# Patient Record
Sex: Female | Born: 1955 | Race: White | Hispanic: No | Marital: Married | Smoking: Never smoker
Health system: Southern US, Community
[De-identification: ages and names within clinical notes are randomized; demographics above are authoritative.]

## PROBLEM LIST (undated history)

## (undated) DIAGNOSIS — N3281 Overactive bladder: Secondary | ICD-10-CM

## (undated) DIAGNOSIS — R600 Localized edema: Secondary | ICD-10-CM

## (undated) DIAGNOSIS — N952 Postmenopausal atrophic vaginitis: Secondary | ICD-10-CM

## (undated) DIAGNOSIS — D369 Benign neoplasm, unspecified site: Secondary | ICD-10-CM

## (undated) DIAGNOSIS — K2210 Ulcer of esophagus without bleeding: Secondary | ICD-10-CM

## (undated) DIAGNOSIS — M797 Fibromyalgia: Secondary | ICD-10-CM

## (undated) DIAGNOSIS — Z8669 Personal history of other diseases of the nervous system and sense organs: Secondary | ICD-10-CM

## (undated) DIAGNOSIS — D126 Benign neoplasm of colon, unspecified: Secondary | ICD-10-CM

## (undated) DIAGNOSIS — R000 Tachycardia, unspecified: Secondary | ICD-10-CM

## (undated) DIAGNOSIS — M1990 Unspecified osteoarthritis, unspecified site: Secondary | ICD-10-CM

## (undated) DIAGNOSIS — L718 Other rosacea: Secondary | ICD-10-CM

## (undated) DIAGNOSIS — Z5189 Encounter for other specified aftercare: Secondary | ICD-10-CM

## (undated) DIAGNOSIS — Z8679 Personal history of other diseases of the circulatory system: Secondary | ICD-10-CM

## (undated) DIAGNOSIS — E7800 Pure hypercholesterolemia, unspecified: Secondary | ICD-10-CM

## (undated) DIAGNOSIS — D131 Benign neoplasm of stomach: Secondary | ICD-10-CM

## (undated) DIAGNOSIS — I7300 Raynaud's syndrome without gangrene: Secondary | ICD-10-CM

## (undated) DIAGNOSIS — K219 Gastro-esophageal reflux disease without esophagitis: Secondary | ICD-10-CM

## (undated) HISTORY — DX: Other rosacea: L71.8

## (undated) HISTORY — PX: FOOT SURGERY: SHX648

## (undated) HISTORY — DX: Benign neoplasm of colon, unspecified: D12.6

## (undated) HISTORY — DX: Overactive bladder: N32.81

## (undated) HISTORY — DX: Localized edema: R60.0

## (undated) HISTORY — DX: Personal history of other diseases of the nervous system and sense organs: Z86.69

## (undated) HISTORY — PX: BUNIONECTOMY: SHX129

## (undated) HISTORY — DX: Encounter for other specified aftercare: Z51.89

## (undated) HISTORY — DX: Unspecified osteoarthritis, unspecified site: M19.90

## (undated) HISTORY — PX: APPENDECTOMY: SHX54

## (undated) HISTORY — DX: Benign neoplasm of stomach: D13.1

## (undated) HISTORY — DX: Raynaud's syndrome without gangrene: I73.00

## (undated) HISTORY — DX: Fibromyalgia: M79.7

## (undated) HISTORY — DX: Postmenopausal atrophic vaginitis: N95.2

## (undated) HISTORY — DX: Pure hypercholesterolemia, unspecified: E78.00

## (undated) HISTORY — PX: TOTAL ABDOMINAL HYSTERECTOMY: SHX209

## (undated) HISTORY — PX: ESOPHAGOGASTRODUODENOSCOPY ENDOSCOPY: SHX5814

## (undated) HISTORY — DX: Benign neoplasm, unspecified site: D36.9

## (undated) HISTORY — PX: CATARACT EXTRACTION BILATERAL W/ ANTERIOR VITRECTOMY: SHX1304

## (undated) HISTORY — PX: OTHER SURGICAL HISTORY: SHX169

## (undated) HISTORY — DX: Gastro-esophageal reflux disease without esophagitis: K21.9

## (undated) HISTORY — PX: BREAST EXCISIONAL BIOPSY: SUR124

## (undated) HISTORY — DX: Ulcer of esophagus without bleeding: K22.10

## (undated) HISTORY — DX: Tachycardia, unspecified: R00.0

## (undated) HISTORY — DX: Personal history of other diseases of the circulatory system: Z86.79

## (undated) HISTORY — PX: REDUCTION MAMMAPLASTY: SUR839

## (undated) HISTORY — PX: BACK SURGERY: SHX140

---
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
## 1980-11-07 HISTORY — PX: OOPHORECTOMY: SHX86

## 2000-11-07 HISTORY — PX: OTHER SURGICAL HISTORY: SHX169

## 2001-01-05 ENCOUNTER — Encounter: Payer: Self-pay | Admitting: Obstetrics and Gynecology

## 2001-01-09 ENCOUNTER — Inpatient Hospital Stay (HOSPITAL_COMMUNITY): Admission: RE | Admit: 2001-01-09 | Discharge: 2001-01-11 | Payer: Self-pay | Admitting: Obstetrics and Gynecology

## 2001-01-09 ENCOUNTER — Encounter (INDEPENDENT_AMBULATORY_CARE_PROVIDER_SITE_OTHER): Payer: Self-pay

## 2001-08-31 ENCOUNTER — Encounter: Admission: RE | Admit: 2001-08-31 | Discharge: 2001-08-31 | Payer: Self-pay | Admitting: Family Medicine

## 2001-08-31 ENCOUNTER — Encounter: Payer: Self-pay | Admitting: Family Medicine

## 2002-01-11 ENCOUNTER — Other Ambulatory Visit: Admission: RE | Admit: 2002-01-11 | Discharge: 2002-01-11 | Payer: Self-pay | Admitting: Obstetrics and Gynecology

## 2002-01-29 ENCOUNTER — Encounter: Admission: RE | Admit: 2002-01-29 | Discharge: 2002-01-29 | Payer: Self-pay | Admitting: Family Medicine

## 2002-01-29 ENCOUNTER — Encounter: Payer: Self-pay | Admitting: Family Medicine

## 2003-01-09 ENCOUNTER — Encounter: Payer: Self-pay | Admitting: Internal Medicine

## 2003-01-09 ENCOUNTER — Encounter: Payer: Self-pay | Admitting: Gastroenterology

## 2003-01-13 ENCOUNTER — Other Ambulatory Visit: Admission: RE | Admit: 2003-01-13 | Discharge: 2003-01-13 | Payer: Self-pay | Admitting: Obstetrics and Gynecology

## 2003-09-26 ENCOUNTER — Encounter: Admission: RE | Admit: 2003-09-26 | Discharge: 2003-09-26 | Payer: Self-pay | Admitting: Family Medicine

## 2004-01-16 ENCOUNTER — Other Ambulatory Visit: Admission: RE | Admit: 2004-01-16 | Discharge: 2004-01-16 | Payer: Self-pay | Admitting: Obstetrics and Gynecology

## 2004-10-07 ENCOUNTER — Ambulatory Visit (HOSPITAL_BASED_OUTPATIENT_CLINIC_OR_DEPARTMENT_OTHER): Admission: RE | Admit: 2004-10-07 | Discharge: 2004-10-07 | Payer: Self-pay | Admitting: Orthopedic Surgery

## 2005-01-06 ENCOUNTER — Ambulatory Visit (HOSPITAL_BASED_OUTPATIENT_CLINIC_OR_DEPARTMENT_OTHER): Admission: RE | Admit: 2005-01-06 | Discharge: 2005-01-06 | Payer: Self-pay | Admitting: Orthopedic Surgery

## 2005-01-17 ENCOUNTER — Other Ambulatory Visit: Admission: RE | Admit: 2005-01-17 | Discharge: 2005-01-17 | Payer: Self-pay | Admitting: Addiction Medicine

## 2005-07-19 ENCOUNTER — Encounter: Admission: RE | Admit: 2005-07-19 | Discharge: 2005-07-19 | Payer: Self-pay | Admitting: Obstetrics and Gynecology

## 2005-11-07 HISTORY — PX: DENTAL SURGERY: SHX609

## 2006-01-23 ENCOUNTER — Other Ambulatory Visit: Admission: RE | Admit: 2006-01-23 | Discharge: 2006-01-23 | Payer: Self-pay | Admitting: Obstetrics and Gynecology

## 2006-07-21 ENCOUNTER — Encounter: Admission: RE | Admit: 2006-07-21 | Discharge: 2006-07-21 | Payer: Self-pay | Admitting: Obstetrics and Gynecology

## 2006-09-14 ENCOUNTER — Ambulatory Visit (HOSPITAL_COMMUNITY): Admission: RE | Admit: 2006-09-14 | Discharge: 2006-09-15 | Payer: Self-pay | Admitting: Neurosurgery

## 2007-01-25 ENCOUNTER — Other Ambulatory Visit: Admission: RE | Admit: 2007-01-25 | Discharge: 2007-01-25 | Payer: Self-pay | Admitting: Obstetrics and Gynecology

## 2007-05-08 HISTORY — PX: OTHER SURGICAL HISTORY: SHX169

## 2007-05-14 ENCOUNTER — Encounter: Admission: RE | Admit: 2007-05-14 | Discharge: 2007-05-14 | Payer: Self-pay | Admitting: Neurosurgery

## 2007-05-31 ENCOUNTER — Ambulatory Visit (HOSPITAL_COMMUNITY): Admission: RE | Admit: 2007-05-31 | Discharge: 2007-06-01 | Payer: Self-pay | Admitting: Neurosurgery

## 2007-07-23 ENCOUNTER — Encounter: Admission: RE | Admit: 2007-07-23 | Discharge: 2007-07-23 | Payer: Self-pay | Admitting: Obstetrics and Gynecology

## 2007-09-25 ENCOUNTER — Encounter: Admission: RE | Admit: 2007-09-25 | Discharge: 2007-09-25 | Payer: Self-pay | Admitting: Obstetrics and Gynecology

## 2008-01-29 ENCOUNTER — Other Ambulatory Visit: Admission: RE | Admit: 2008-01-29 | Discharge: 2008-01-29 | Payer: Self-pay | Admitting: Obstetrics and Gynecology

## 2008-06-12 ENCOUNTER — Encounter: Admission: RE | Admit: 2008-06-12 | Discharge: 2008-06-12 | Payer: Self-pay | Admitting: Family Medicine

## 2008-07-02 HISTORY — PX: CARDIOVASCULAR STRESS TEST: SHX262

## 2008-07-23 ENCOUNTER — Encounter: Admission: RE | Admit: 2008-07-23 | Discharge: 2008-07-23 | Payer: Self-pay | Admitting: Obstetrics and Gynecology

## 2008-07-24 ENCOUNTER — Encounter: Admission: RE | Admit: 2008-07-24 | Discharge: 2008-07-24 | Payer: Self-pay | Admitting: Obstetrics and Gynecology

## 2008-09-10 ENCOUNTER — Telehealth: Payer: Self-pay | Admitting: Gastroenterology

## 2008-09-16 ENCOUNTER — Ambulatory Visit: Payer: Self-pay | Admitting: Obstetrics and Gynecology

## 2008-09-17 ENCOUNTER — Telehealth: Payer: Self-pay | Admitting: Gastroenterology

## 2008-09-19 ENCOUNTER — Ambulatory Visit: Payer: Self-pay | Admitting: Gastroenterology

## 2008-09-19 DIAGNOSIS — K921 Melena: Secondary | ICD-10-CM | POA: Insufficient documentation

## 2008-09-19 DIAGNOSIS — Z860100 Personal history of colon polyps, unspecified: Secondary | ICD-10-CM | POA: Insufficient documentation

## 2008-09-19 DIAGNOSIS — R1032 Left lower quadrant pain: Secondary | ICD-10-CM | POA: Insufficient documentation

## 2008-09-19 DIAGNOSIS — K5900 Constipation, unspecified: Secondary | ICD-10-CM | POA: Insufficient documentation

## 2008-09-19 LAB — CONVERTED CEMR LAB
Basophils Relative: 1.1 % (ref 0.0–3.0)
CO2: 26 meq/L (ref 19–32)
Calcium: 9.4 mg/dL (ref 8.4–10.5)
Chloride: 110 meq/L (ref 96–112)
Eosinophils Absolute: 0 10*3/uL (ref 0.0–0.7)
Eosinophils Relative: 0.5 % (ref 0.0–5.0)
GFR calc non Af Amer: 62 mL/min
HCT: 39.9 % (ref 36.0–46.0)
MCHC: 34.1 g/dL (ref 30.0–36.0)
Monocytes Absolute: 0.4 10*3/uL (ref 0.1–1.0)
Neutro Abs: 4.7 10*3/uL (ref 1.4–7.7)
Platelets: 234 10*3/uL (ref 150–400)
Potassium: 4.2 meq/L (ref 3.5–5.1)
Sodium: 142 meq/L (ref 135–145)
TSH: 1.36 microintl units/mL (ref 0.35–5.50)
Total Bilirubin: 0.7 mg/dL (ref 0.3–1.2)
Total Protein: 6.8 g/dL (ref 6.0–8.3)

## 2008-10-06 ENCOUNTER — Telehealth: Payer: Self-pay | Admitting: Gastroenterology

## 2008-10-08 ENCOUNTER — Ambulatory Visit: Payer: Self-pay | Admitting: Gastroenterology

## 2008-10-08 ENCOUNTER — Encounter: Payer: Self-pay | Admitting: Gastroenterology

## 2008-10-12 ENCOUNTER — Encounter: Payer: Self-pay | Admitting: Gastroenterology

## 2008-11-29 ENCOUNTER — Encounter: Admission: RE | Admit: 2008-11-29 | Discharge: 2008-11-29 | Payer: Self-pay | Admitting: Neurosurgery

## 2008-12-19 ENCOUNTER — Ambulatory Visit: Payer: Self-pay | Admitting: Obstetrics and Gynecology

## 2008-12-24 ENCOUNTER — Encounter: Admission: RE | Admit: 2008-12-24 | Discharge: 2008-12-24 | Payer: Self-pay | Admitting: Obstetrics and Gynecology

## 2009-01-27 ENCOUNTER — Ambulatory Visit: Payer: Self-pay | Admitting: Obstetrics and Gynecology

## 2009-01-29 ENCOUNTER — Ambulatory Visit: Payer: Self-pay | Admitting: Obstetrics and Gynecology

## 2009-01-29 ENCOUNTER — Encounter: Payer: Self-pay | Admitting: Obstetrics and Gynecology

## 2009-01-29 ENCOUNTER — Other Ambulatory Visit: Admission: RE | Admit: 2009-01-29 | Discharge: 2009-01-29 | Payer: Self-pay | Admitting: Obstetrics and Gynecology

## 2009-07-24 ENCOUNTER — Encounter: Admission: RE | Admit: 2009-07-24 | Discharge: 2009-07-24 | Payer: Self-pay | Admitting: Obstetrics and Gynecology

## 2009-10-18 DIAGNOSIS — J4540 Moderate persistent asthma, uncomplicated: Secondary | ICD-10-CM | POA: Insufficient documentation

## 2009-10-18 DIAGNOSIS — M47819 Spondylosis without myelopathy or radiculopathy, site unspecified: Secondary | ICD-10-CM | POA: Insufficient documentation

## 2009-10-18 DIAGNOSIS — G43909 Migraine, unspecified, not intractable, without status migrainosus: Secondary | ICD-10-CM | POA: Insufficient documentation

## 2009-10-18 DIAGNOSIS — Z79890 Hormone replacement therapy (postmenopausal): Secondary | ICD-10-CM | POA: Insufficient documentation

## 2010-01-27 ENCOUNTER — Ambulatory Visit (HOSPITAL_COMMUNITY): Admission: RE | Admit: 2010-01-27 | Discharge: 2010-01-27 | Payer: Self-pay | Admitting: Internal Medicine

## 2010-02-01 ENCOUNTER — Encounter: Payer: Self-pay | Admitting: Gastroenterology

## 2010-02-02 ENCOUNTER — Ambulatory Visit: Payer: Self-pay | Admitting: Obstetrics and Gynecology

## 2010-02-02 ENCOUNTER — Other Ambulatory Visit: Admission: RE | Admit: 2010-02-02 | Discharge: 2010-02-02 | Payer: Self-pay | Admitting: Obstetrics and Gynecology

## 2010-02-03 ENCOUNTER — Ambulatory Visit: Payer: Self-pay | Admitting: Gastroenterology

## 2010-02-03 DIAGNOSIS — R1013 Epigastric pain: Secondary | ICD-10-CM | POA: Insufficient documentation

## 2010-02-03 DIAGNOSIS — R109 Unspecified abdominal pain: Secondary | ICD-10-CM | POA: Insufficient documentation

## 2010-02-03 DIAGNOSIS — K219 Gastro-esophageal reflux disease without esophagitis: Secondary | ICD-10-CM | POA: Insufficient documentation

## 2010-02-09 ENCOUNTER — Ambulatory Visit: Payer: Self-pay | Admitting: Internal Medicine

## 2010-02-09 ENCOUNTER — Other Ambulatory Visit: Admission: RE | Admit: 2010-02-09 | Discharge: 2010-02-09 | Payer: Self-pay | Admitting: Internal Medicine

## 2010-02-11 ENCOUNTER — Encounter: Payer: Self-pay | Admitting: Internal Medicine

## 2010-02-23 ENCOUNTER — Ambulatory Visit: Payer: Self-pay | Admitting: Obstetrics and Gynecology

## 2010-03-01 ENCOUNTER — Telehealth: Payer: Self-pay | Admitting: Gastroenterology

## 2010-03-01 ENCOUNTER — Ambulatory Visit (HOSPITAL_COMMUNITY): Admission: RE | Admit: 2010-03-01 | Discharge: 2010-03-01 | Payer: Self-pay | Admitting: Gastroenterology

## 2010-04-19 DIAGNOSIS — F331 Major depressive disorder, recurrent, moderate: Secondary | ICD-10-CM | POA: Insufficient documentation

## 2010-07-26 ENCOUNTER — Encounter: Admission: RE | Admit: 2010-07-26 | Discharge: 2010-07-26 | Payer: Self-pay | Admitting: Obstetrics and Gynecology

## 2010-10-27 ENCOUNTER — Ambulatory Visit
Admission: RE | Admit: 2010-10-27 | Discharge: 2010-10-27 | Payer: Self-pay | Source: Home / Self Care | Attending: Orthopedic Surgery | Admitting: Orthopedic Surgery

## 2010-11-07 HISTORY — PX: REDUCTION MAMMAPLASTY: SUR839

## 2010-11-29 ENCOUNTER — Ambulatory Visit
Admission: RE | Admit: 2010-11-29 | Discharge: 2010-11-29 | Payer: Self-pay | Source: Home / Self Care | Attending: Obstetrics and Gynecology | Admitting: Obstetrics and Gynecology

## 2010-11-30 ENCOUNTER — Ambulatory Visit: Admit: 2010-11-30 | Payer: Self-pay | Admitting: Obstetrics and Gynecology

## 2010-12-09 NOTE — Assessment & Plan Note (Signed)
Summary: UPPER ABD PAIN...AS.   History of Present Illness Visit Type: Follow-up Visit Primary GI MD: Elie Goody MD [facility] Primary Provider: Guerry Bruin, MD Chief Complaint: Epigastric pain x 1 week, loss  of appetite History of Present Illness:   Lauren Montes relates a one week history of worsening problems with upper abdominal pain, belching, bloating, loss of appetite, and nausea. Her symptoms began abruptly one week ago and were associated with shortness of breath and tachycardic. She was evaluated by Dr. Waynard Edwards and a CT angiogram was negative, abdominal ultrasound was negative, and blood work was unremarkable. She was placed on Dexilant with some improvement in her symptoms. She has not tried Levsin for her symptoms. She has had chronic constipation, which is mildly improved with MiraLax. She underwent colonoscopy in December 2009, showing only small, hyperplastic polyps.   GI Review of Systems    Reports abdominal pain, belching, bloating, loss of appetite, and  nausea.      Denies acid reflux, chest pain, dysphagia with liquids, dysphagia with solids, heartburn, vomiting, vomiting blood, weight loss, and  weight gain.      Reports constipation, diarrhea, and  light color stool.     Denies anal fissure, black tarry stools, change in bowel habit, diverticulosis, fecal incontinence, heme positive stool, hemorrhoids, irritable bowel syndrome, jaundice, liver problems, rectal bleeding, and  rectal pain.   Current Medications (verified): 1)  Singulair 10 Mg Tabs (Montelukast Sodium) .Marland Kitchen.. 1 Tablet By Mouth At Bedtime 2)  Topiramate 25 Mg Tabs (Topiramate) .... 2 Tablets By Mouth At Bedtime 3)  Estradiol 0.0375 Mg/24hr Ptwk (Estradiol) .Marland Kitchen.. 1 Patch Weekly 4)  Advair Diskus 250-50 Mcg/dose Misc (Fluticasone-Salmeterol) .Marland Kitchen.. 1 Puff Two Times A Day 5)  Cyclobenzaprine Hcl 10 Mg Tabs (Cyclobenzaprine Hcl) .... As Needed 6)  Nabumetone 500 Mg Tabs (Nabumetone) .Marland Kitchen.. 1 Tablet By Mouth Two  Times A Day As Needed 7)  Propranolol Hcl 10 Mg Tabs (Propranolol Hcl) .... Hold 8)  Diltiazem Hcl Cr 180 Mg Xr24h-Cap (Diltiazem Hcl) .Marland Kitchen.. 1 Capsule By Mouth Once Daily 9)  Zyrtec Allergy 10 Mg Tabs (Cetirizine Hcl) .Marland Kitchen.. 1 Tablet By Mouth Qam 10)  Daily Multiple Vitamins/iron  Tabs (Multiple Vitamins-Iron) .Marland Kitchen.. 1 Tablet By Mouth Once Daily 11)  Oscal 500/200 D-3 500-200 Mg-Unit Tabs (Calcium-Vitamin D) .Marland Kitchen.. 1 Tablet By Mouth Two Times A Day 12)  Levsin/sl 0.125 Mg Subl (Hyoscyamine Sulfate) .... 2 Tablets By Mouth Four Times A Day As Needed For Abd Pain 13)  Miralax  Pack (Polyethylene Glycol 3350) .... One Pack Into 17 Oz of Water Once Daily 14)  Dexilant 60 Mg Cpdr (Dexlansoprazole) .... Once Daily  Allergies (verified): 1)  ! Codeine  Past History:  Past Medical History: Reviewed history from 09/19/2008 and no changes required. Arthritis Allergies Asthma Hx of hyperplastic colon polyp 01/2003 Anemia Arrhythmia Headaches  Past Surgical History: Hysterectomy Benign breast biopsy Surgical removal of a ovarian tumor Appendectomy Back Surgery x2  Family History: Reviewed history from 09/19/2008 and no changes required. No FH of Colon Cancer: Family History of Colon Polyps: Grandmother Family History of Pancreatic Cancer:Aunt Family History of Stomach Cancer:Aunt Family History of Heart Disease: Father  Social History: Reviewed history from 09/19/2008 and no changes required. Married Patient has never smoked.  Alcohol Use - yes Illicit Drug Use - no Occupation: retired  Review of Systems       The patient complains of allergy/sinus, arthritis/joint pain, back pain, cough, fatigue, headaches-new, heart rhythm changes, shortness of breath,  sleeping problems, and urination changes/pain.         The pertinent positives and negatives are noted as above and in the HPI. All other ROS were reviewed and were negative.   Vital Signs:  Patient profile:   55 year old  female Height:      65.5 inches Weight:      145.13 pounds BMI:     23.87 Pulse rate:   64 / minute Pulse rhythm:   regular BP sitting:   106 / 80  (left arm) Cuff size:   regular  Vitals Entered By: June McMurray CMA Duncan Dull) (February 03, 2010 3:23 PM)  Physical Exam  General:  Well developed, well nourished, no acute distress. Head:  Normocephalic and atraumatic. Eyes:  PERRLA, no icterus. Ears:  Normal auditory acuity. Mouth:  No deformity or lesions, dentition normal. Neck:  Supple; no masses or thyromegaly. Lungs:  Clear throughout to auscultation. Heart:  Regular rate and rhythm; no murmurs, rubs,  or bruits. Abdomen:  Soft and nondistended. No masses, hepatosplenomegaly or hernias noted. Normal bowel sounds. Very mild upper abdominal tenderness to deep palpation without rebound or guarding. Psych:  Alert and cooperative. Anxious.    Impression & Recommendations:  Problem # 1:  ABDOMINAL PAIN-MULTIPLE SITES (ICD-789.09) Abdominal pain associated with gas, bloating, constipation, and reflux symptoms. Rule out ulcer disease, GERD, and irritable bowel syndrome. Rule out acalculous cholecystitis. The risks, benefits and alternatives to endoscopy with possible biopsy and possible dilation were discussed with the patient and they consent to proceed. The procedure will be scheduled electively. Schedule CCK HIDA. Advised to use Levsin  1-2 q.i.d. p.r.n. ontinue Dexliant 60 mg q.a.m. begin Align one q. day and titrate MiraLax up to 3 times a day as needed for management of constipation.  Problem # 2:  GERD (ICD-530.81) See problem #1.  Problem # 3:  CONSTIPATION (ICD-564.00) See problem #1. Rule out irritable bowel syndrome.  Other Orders: HIDA CCK (HIDA CCK) EGD (EGD)   Patient Instructions: 1)  Please increase Miralax to three times a day dosing. You may titrate the dose as needed for adequate bowel movement control. 2)  Please take your Levsin up to four times daily as  needed. 3)  Please go to [facility] on 02/23/10 @ 11 am for your HIDA with CCK. 4)  Your endoscopy will be on 02/09/10 @ 3:00 pm. You will need to arrive on the 4th floor of [facility] at 2:00 pm for registration. 5)  Please take Align once daily x 1 month. If this helps, you may continue taking it. Align can be purchased over the counter. We have given you a coupon to use. 6)  The medication list was reviewed and reconciled.  All changed / newly prescribed medications were explained.  A complete medication list was provided to the patient / caregiver.

## 2010-12-09 NOTE — Letter (Signed)
Summary: [facility]  [facility]   Imported By: Sherian Rein 02/16/2010 10:20:41  _____________________________________________________________________  External Attachment:    Type:   Image     Comment:   External Document

## 2010-12-09 NOTE — Letter (Signed)
Summary: Patient [facility] Biopsy Results  [facility]  [address] [place], [place] [address]   Phone: [phone]  Fax: [phone]        February 11, 2010 MRN: [id number]    [facility] [address] [place], [place]  [address]    Dear Ms. Stai,  I am pleased to inform you that the biopsies taken during your recent endoscopic examination did not show any evidence of cancer upon pathologic examination.There is a yeast infection on the collected tissue. All other biopsies are normal  Additional information/recommendations:  __No further action is needed at this time.  Please follow-up with      your primary care physician for your other healthcare needs.  __ Please call [phone] to schedule a return visit to review      your condition.  _x_ Continue with the treatment plan as outlined on the day of your      exam.  _   Please call us if you are having persistent problems or have questions about your condition that have not been fully answered at this time.  Sincerely,  Hart Carwin MD  This letter has been electronically signed by your physician.  Appended Document: Patient Notice-Endo Biopsy Results letter mailed 4.11.11

## 2010-12-09 NOTE — Procedures (Signed)
Summary: Upper Endoscopy  Patient: [name] Note: All result statuses are Final unless otherwise noted.  Tests: (1) Upper Endoscopy (EGD)   EGD Upper Endoscopy       DONE     [facility]     [address]     [place], [place]  [address]           ENDOSCOPY PROCEDURE REPORT           PATIENT:  Lauren Montes, Lauren Montes  MR#:  [id number]     BIRTHDATE:  15-Jun-1956, 53 yrs. old  GENDER:  female           ENDOSCOPIST:  Hedwig [place]. Juanda Chance, MD     Referred by:  Guerry Bruin, M.D.           PROCEDURE DATE:  02/09/2010     PROCEDURE:  EGD with biopsy, EGD w/brushings     ASA CLASS:  Class I     INDICATIONS:  dysphagia, dyspepsia, epigastric pain obrupt onset     of dyspepsia, " choking", nausea,, also SOB and tachycardia     abd.     Dexilant helps some sono negative     hx of asthmatic bronchitis           MEDICATIONS:   Versed 12 mg, Fentanyl 100 mcg     TOPICAL ANESTHETIC:  Exactacain Spray           DESCRIPTION OF PROCEDURE:   After the risks benefits and     alternatives of the procedure were thoroughly explained, informed     consent was obtained.  The LB GIF-H180 [id number] endoscope was     introduced through the mouth and advanced to the second portion of     the duodenum, without limitations.  The instrument was slowly     withdrawn as the mucosa was fully examined.     <<PROCEDUREIMAGES>>           Esophagitis was found in the total esophagus. specks of whitish     exudate throughout the esophagus A brushing for microbiology was     obtained for fungal analysis. With standard forceps, a biopsy was     obtained and sent to pathology (see image1, image2, image7,     image8, image11, and image12).  Otherwise the examination was     normal. biopsies from duodenul to r/o sprue     biopsies from gastric antrum to r/o H.Pylori With standard     forceps, a biopsy was obtained and sent to pathology (see image4     and image3).  There were multiple polyps identified (see  image5     and image6). fundic gland polyps    Retroflexed views revealed no     abnormalities.    The scope was then withdrawn from the patient     and the procedure completed.           COMPLICATIONS:  None           ENDOSCOPIC IMPRESSION:     1) Esophagitis in the total esophagus     2) Otherwise normal examination     3) Polyps, multiple     Candida esophagitis, suspected, s/p brushings, may explain "     choking" feeling     pt was anxious, required higher dose of Versed/fentanyl for     sedation, r/o functional dyspepsia     RECOMMENDATIONS:     1) Await biopsy results  continue PPI     Diflucan 100mg  po qdx3, # 3, no refill     HIDA with CCK already scheduled     cont LevsinSL,     F/up appointm. with Dr Russella Dar 3-4 weeks           REPEAT EXAM:  In 0 year(s) for.           ______________________________     Hedwig [place]. Juanda Chance, MD           CC:           n.     eSIGNED:   Hedwig [place]. Brodie at 02/09/2010 04:19 PM           Page 2 of 3   Lauren Montes, Lauren Montes, [id number]  Note: An exclamation mark (!) indicates a result that was not dispersed into the flowsheet. Document Creation Date: 02/10/2010 9:03 AM _______________________________________________________________________  (1) Order result status: Final Collection or observation date-time: 02/09/2010 16:05 Requested date-time:  Receipt date-time:  Reported date-time:  Referring Physician:   Ordering Physician: Lina Sar [phone]) Specimen Source:  Source: Launa Grill Order Number: [id number] Lab site:

## 2010-12-09 NOTE — Letter (Signed)
Summary: EGD Instructions  [facility]  [address] [place], [place] [address]   Phone: [phone]  Fax: [phone]       Lauren Montes    September 02, 1956    MRN: [id number]       Procedure Day /Date: 02/09/10 Tuesday     Arrival Time: 2:00 pm     Procedure Time: 3:00 pm     Location of Procedure:                    _x  _ [facility] (4th Floor)  PREPARATION FOR ENDOSCOPY   On_ 02/09/10 THE DAY OF THE PROCEDURE:  1.   No solid foods, milk or milk products are allowed after midnight the night before your procedure.  2.   Do not drink anything colored red or purple.  Avoid juices with pulp.  No orange juice.  3.  You may drink clear liquids until 1:00 pm, which is 2 hours before your procedure.                                                                                                CLEAR LIQUIDS INCLUDE: Water Jello Ice Popsicles Tea (sugar ok, no milk/cream) Powdered fruit flavored drinks Coffee (sugar ok, no milk/cream) Gatorade Juice: apple, white grape, white cranberry  Lemonade Clear bullion, consomm, broth Carbonated beverages (any kind) Strained chicken noodle soup Hard Candy   MEDICATION INSTRUCTIONS  Unless otherwise instructed, you should take regular prescription medications with a small sip of water as early as possible the morning of your procedure.                 OTHER INSTRUCTIONS   You will need a responsible adult at least 55 years of age to accompany you and drive you home.   This person must remain in the waiting room during your procedure.  Wear loose fitting clothing that is easily removed.  Leave jewelry and other valuables at home.  However, you may wish to bring a book to read or an iPod/MP3 player to listen to music as you wait for your procedure to start.  Remove all body piercing jewelry and leave at home.  Total time from sign-in until discharge is approximately 2-3 hours.  You should go  home directly after your procedure and rest.  You can resume normal activities the day after your procedure.  The day of your procedure you should not:   Drive   Make legal decisions   Operate machinery   Drink alcohol   Return to work  You will receive specific instructions about eating, activities and medications before you leave.    The above instructions have been reviewed and explained to me by  Hortense Ramal CMA Duncan Dull)  February 03, 2010 3:57 PM     I fully understand and can verbalize these instructions _____________________________ Date 02/03/10

## 2010-12-09 NOTE — Miscellaneous (Signed)
Summary: gi med  Clinical Lists Changes  Medications: Added new medication of DIFLUCAN 100 MG  TABS (FLUCONAZOLE) by mouth x once daily x3d - Signed Rx of DIFLUCAN 100 MG  TABS (FLUCONAZOLE) by mouth x once daily x3d;  #3 x 0;  Signed;  Entered by: Eual Fines RN;  Authorized by: Hart Carwin MD;  Method used: Electronically to [facility] [address] [phone]*, [address], [place], [place], [place]  [address], Ph: [phone] or [phone], Fax: [phone] Observations: Added new observation of ALLERGY REV: Done (02/09/2010 16:38)    Prescriptions: DIFLUCAN 100 MG  TABS (FLUCONAZOLE) by mouth x once daily x3d  #3 x 0   Entered by:   Eual Fines RN   Authorized by:   Hart Carwin MD   Signed by:   Eual Fines RN on 02/09/2010   Method used:   [facility]        [facility] [address] [phone]* (retail)       [address]       [place], [place]  [address]       Ph: [phone] or [phone]       Fax: [phone]   RxID:   [id number]

## 2010-12-09 NOTE — Progress Notes (Signed)
Summary: results/appt  Phone Note Call from Patient Call back at Home Phone [phone]   Caller: Patient Call For: Russella Dar Reason for Call: Lab or Test Results Summary of Call: Patient wants to schedule an appt to get her test results (for next week)  Initial call taken by: Tawni Levy,  March 01, 2010 2:41 PM     Appended Document: results/appt Patient  advised that Dr Russella Dar is out of the office and that we will call with results of HIDA when he reviews.

## 2010-12-29 ENCOUNTER — Ambulatory Visit: Payer: Self-pay | Admitting: Cardiovascular Disease

## 2011-01-17 LAB — BASIC METABOLIC PANEL
BUN: 15 mg/dL (ref 6–23)
Calcium: 9.5 mg/dL (ref 8.4–10.5)
Creatinine, Ser: 0.91 mg/dL (ref 0.4–1.2)
GFR calc Af Amer: >60 mL/min (ref >60)
Glucose, Bld: 97 mg/dL (ref 70–99)

## 2011-01-20 ENCOUNTER — Encounter: Payer: Self-pay | Admitting: Cardiovascular Disease

## 2011-01-20 ENCOUNTER — Ambulatory Visit (INDEPENDENT_AMBULATORY_CARE_PROVIDER_SITE_OTHER): Payer: 59 | Admitting: Cardiovascular Disease

## 2011-01-20 DIAGNOSIS — M7989 Other specified soft tissue disorders: Secondary | ICD-10-CM | POA: Insufficient documentation

## 2011-01-20 DIAGNOSIS — E7800 Pure hypercholesterolemia, unspecified: Secondary | ICD-10-CM

## 2011-01-20 DIAGNOSIS — R079 Chest pain, unspecified: Secondary | ICD-10-CM

## 2011-01-21 ENCOUNTER — Encounter: Payer: Self-pay | Admitting: Cardiovascular Disease

## 2011-01-21 ENCOUNTER — Encounter (INDEPENDENT_AMBULATORY_CARE_PROVIDER_SITE_OTHER): Payer: 59

## 2011-01-21 DIAGNOSIS — M7989 Other specified soft tissue disorders: Secondary | ICD-10-CM

## 2011-01-25 NOTE — Miscellaneous (Signed)
Summary: Orders Update  Clinical Lists Changes  Problems: Added new problem of SWELLING OF LIMB (ICD-729.81) Orders: Added new Test order of Venous Duplex Lower Extremity (Venous Duplex Lower) - Signed 

## 2011-01-26 ENCOUNTER — Other Ambulatory Visit: Payer: Self-pay | Admitting: Dermatology

## 2011-01-27 ENCOUNTER — Telehealth: Payer: Self-pay | Admitting: Cardiology

## 2011-01-27 NOTE — Telephone Encounter (Signed)
Please fax records [phone], needs, last ov, ekg, stress test, echo.

## 2011-02-07 ENCOUNTER — Other Ambulatory Visit (HOSPITAL_COMMUNITY)
Admission: RE | Admit: 2011-02-07 | Discharge: 2011-02-07 | Disposition: A | Discharge status: Home / Self Care | Payer: 59 | Source: Ambulatory Visit | Attending: Obstetrics and Gynecology | Admitting: Obstetrics and Gynecology

## 2011-02-07 ENCOUNTER — Other Ambulatory Visit: Payer: Self-pay | Admitting: Obstetrics and Gynecology

## 2011-02-07 ENCOUNTER — Encounter (INDEPENDENT_AMBULATORY_CARE_PROVIDER_SITE_OTHER): Payer: 59 | Admitting: Obstetrics and Gynecology

## 2011-02-07 DIAGNOSIS — Z124 Encounter for screening for malignant neoplasm of cervix: Secondary | ICD-10-CM | POA: Insufficient documentation

## 2011-02-07 DIAGNOSIS — R809 Proteinuria, unspecified: Secondary | ICD-10-CM

## 2011-02-07 DIAGNOSIS — Z01419 Encounter for gynecological examination (general) (routine) without abnormal findings: Secondary | ICD-10-CM

## 2011-02-16 ENCOUNTER — Encounter (INDEPENDENT_AMBULATORY_CARE_PROVIDER_SITE_OTHER): Payer: 59

## 2011-02-16 DIAGNOSIS — Z13820 Encounter for screening for osteoporosis: Secondary | ICD-10-CM

## 2011-02-23 ENCOUNTER — Other Ambulatory Visit: Payer: Self-pay | Admitting: Dermatology

## 2011-03-22 NOTE — Op Note (Signed)
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   PROCEDURE:  Left L4-5 lumbar laminotomy and microdiskectomy with  microdissection.   SURGEON:  Hewitt Shorts, M.D.   ASSISTANT:  Lovell Sheehan.   ANESTHESIA:  General endotracheal.   INDICATION:  The patient is a 55 year old woman who presented with an  recurrent left lumbar radiculopathy and is found by MRI scan to have a  recurrent left L4-5 lumbar disk herniation.  Decision was made to  proceed with elective laminotomy and microdiskectomy.   PROCEDURE:  The patient was brought to the operating room, placed under  general endotracheal anesthesia.  The patient was turned to a prone  position.  Lumbar region was prepped with Betadine soaping solution,  draped in a sterile fashion.  The midline was infiltrated with local  anesthetic with epinephrine and a midline incision was made and carried  down to the subcutaneous tissue.  Bipolar cautery and electrocautery was  used to maintain hemostasis.  Dissection was carried down to the lumbar  fascia which was incised on the left side of the midline.  The  paraspinal muscle were dissected from the spinous process and lamina in  a subperiosteal fashion.  The L4-5 intralaminar space was identified and  then the microscope was draped and brought into the field to provide  additional magnification,  illumination and visualization and the  remainder of the decompression was performed using microdissection and  microsurgical technique.   Laminotomy was extended rostrally and laterally.  The edges of the  laminotomy were dissected from the underlying scar tissue and we were  able to free up the thecal sac and left L5 nerve root and gently  mobilize these medially.  We incised the scar tissue overlying the disk  herniation and a moderate sized free fragment extruded.  We were able to  remove that.  We then entered into the disk space and removed additional  amounts of degenerated disk material and in the end, all loose fragments  of disk material were removed from both the disk space and the epidural  space and good decompression of the thecal sac and nerve roots achieved.  Once decompression was completed, hemostasis was established and then we  instilled 2 mL of fentanyl and 80 mg of Depo-Medrol into the epidural  space and proceeded with closure.  The deep fascia was closed with  interrupted, undyed 1 Vicryl sutures.  The Scarpa fascia was closed with  interrupted, undyed 1 and 2-0 Vicryl sutures and the subcutaneous and  subcuticular were closed with interrupted, inverted  2-0 and 3-0 undyed  Vicryl sutures and the skin was reapproximated with Dermabond.  The  procedure was tolerated well.  The estimated blood loss was less than 35  mL.  Sponge and needle count were correct.   Following surgery, the patient was turned back to supine position to be  reversed from anesthetic, extubated and transferred to the recovery room  for further care.      Hewitt Shorts, M.D.  Electronically Signed     RWN/MEDQ  D:  05/31/2007  T:  05/31/2007  Job:  [id number]

## 2011-03-25 NOTE — Discharge Summary (Signed)
[facility]  Patient:    Lauren Montes, Lauren Montes                 MRN: [id number] Adm. Date:  [id number] Disc. Date: [id number] Attending:  Sharon Mt                           Discharge Summary  HISTORY OF PRESENT ILLNESS:  The patients a 55 year old female who was admitted to the hospital with a 10 cm pelvic mass. She was taken to the operating room and underwent a right salpingo-oophorectomy having previously undergo a total abdominal hysterectomy and left salpingo-oophorectomy. 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 Postoperatively she did well and by the second postoperative day, she was ready for discharge. She was discharged on Darvocet-N 100 for pain relief. She will resume her Estrace 1 mg daily. She will have a soft diet until she is passing gas well and then will increase in her diet.  DISCHARGE ACTIVITY:  Ambulatory. She will return to our office next Tuesday for staple removal.  FINAL PATHOLOGY:  Report not available at time of dictation.  DISCHARGE DIAGNOSES:  Benign cystic teratoma.  OPERATION PERFORMED:  Exploratory laparotomy with right salpingo-oophorectomy. D:  01/11/01 TD:  01/11/01 Job: [id number]

## 2011-03-25 NOTE — H&P (Signed)
[facility]  Patient:    Lauren Montes, Lauren Montes                         MRN: [id number] Attending:  Rande Brunt. Eda Paschal, M.D.                         History and Physical  CHIEF COMPLAINT:  Adnexal mass.  HISTORY OF PRESENT ILLNESS:  The patient is a 55 year old gravida 1, para 1, AB 0, who came to seem me on February 15 for an annual gynecological exam. At the time of the exam, an adnexal mass in the midline of 9 cm was felt. The patient is status post TAH, left S&O, appendectomy, and partial right ovarian cystectomy done in 1982 for endometriosis in [place]. As a result of this large mass, ultrasound was obtained. Ultrasound revealed a 9.6 x 6.5 x 6 cm echogenic, homogeneous echo throughout mass. There was some shadowing. There was also a fine glass appearance most consistent with an endometrioma. A CA125 was obtained which was normal. Because of the large size of the mass, we have elected to go ahead and remove it. After I told her she had a mass in thinking about how she had been doing, she said that she was having some new symptoms as well which she really had not really thought about but which were obvious now. They included discomfort in her lower abdomen, uncomfortable pressure and stomach ache after eating, cramping, and more gas. She will undergo an exploratory laparotomy through a vertical incision. We will remove her remaining ovary with the tumor. If it is benign, that will be the extent of the surgery. If it is malignant, Daniel L. Clarke-Pearson, M.D. is on standby and will join me to debulk and to stage her properly.  CURRENT MEDICATIONS:  The patient Estrace, Allegra, vitamin E, and calcium.  ALLERGIES:  CODEINE.  FAMILY HISTORY:  Noncontributory. Father has had heart disease.  SOCIAL HISTORY:  She is a nonsmoker, nondrinker.  REVIEW OF SYSTEMS:  HEENT:  Negative. CARDIAC:  Negative. GI:  Negative. GU: Negative, except for above.  NEUROMUSCULAR:  Negative. ENDOCRINE:  Negative.  PHYSICAL EXAMINATION:  GENERAL:  The patient is a well-developed, well-nourished female in no acute distress.  VITAL SIGNS:  Blood pressure is 130/70, pulse is 80 and regular, respirations 16 and nonlabored, she is afebrile.  HEENT:  All within normal limits.  NECK:  Supple. Trachea in the midline. Thyroid is not enlarged.  LUNGS:  Clear to P&A.  HEART:  No thrills, heaves, or murmurs.  BREASTS:  No masses.  ABDOMEN:  Soft, without guarding, rebound, or masses.  PELVIC:  External and vaginal within normal limits. Cervix and uterus are absent. Bimanual and rectal examination reveals a 9 cm midline mass which is very mobile. Rectal is confirmatory.  EXTREMITIES:  Within normal limits.  ADMISSION IMPRESSION:  Pelvic mass.  PLAN:  Exploratory laparotomy.DD:  01/09/01 TD:  01/09/01 Job: [id number]

## 2011-03-25 NOTE — Op Note (Signed)
Lauren Montes, Lauren Montes              ACCOUNT NO.:  [id number]   MEDICAL RECORD NO.:  [id number]          PATIENT TYPE:  AMB   LOCATION:  DSC                          FACILITY:  [facility]   PHYSICIAN:  Nadara Mustard, MD     DATE OF BIRTH:  August 17, 1956   DATE OF PROCEDURE:  01/06/2005  DATE OF DISCHARGE:                                 OPERATIVE REPORT   PREOPERATIVE DIAGNOSIS:  Right great toe hallux valgus deformity.   POSTOPERATIVE DIAGNOSIS:  Right great toe hallux valgus deformity.   PROCEDURE:  Chevron osteotomy, right first metatarsal.   SURGEON.:  Nadara Mustard, MD   ANESTHESIA:  Popliteal block plus LMA.   ESTIMATED BLOOD LOSS:  Minimal.   ANTIBIOTICS:  One gram of Kefzol.   TOURNIQUET TIME:  Esmarch at the ankle for approximately 14 minutes.   DISPOSITION:  To PACU in stable condition.   INDICATION FOR PROCEDURE:  The patient is a 55 year old woman who was status  post bunion surgery for the left foot for a painful left great toe bunion.  The patient presents at this time for a right great toe Chevron osteotomy  for a painful hallux valgus deformity of the great toe.  Risks and benefits  were discussed including infection, neurovascular injury, persistent pain,  need for additional surgery.  The patient states she understands and wishes  proceed at this time.   DESCRIPTION OF PROCEDURE:  The patient was brought to OR room 5 after  undergoing a popliteal block.  She then underwent a general LMA anesthetic.  After adequate level of anesthesia obtained, the patient's right lower  extremity was prepped using DuraPrep and draped into a sterile field.  The  foot was elevated and Esmarch was wrapped around the ankle for tourniquet  control.  A medial longitudinal incision was made; this was carried down  through the retinaculum.  A football shape of the retinaculum was ellipsed  from the plantar aspect to allow the sesamoids to reduce.  An ostectomy was  performed, followed by  a Chevron osteotomy.  The metatarsal head was  translated laterally approximately 4 mm.  This was then stabilized with a  0.65 K-wire.  The remainder of the ostectomy was again performed.  The wound  was irrigated with normal saline.  The tourniquet was released and  hemostasis was obtained.  The toe was held abducted and the retinaculum was  closed using a running 2-0 Vicryl.  The skin was closed using a far-near/near-far suture with 3-0 nylon.  The  wound was covered with Adaptic, orthopedic sponges, sterile Webril and a  Coban dressing.  The patient was extubated and taken to PACU in stable  condition.      MVD/MEDQ  D:  01/06/2005  T:  01/07/2005  Job:  [id number]

## 2011-03-25 NOTE — Op Note (Signed)
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   PROCEDURE:  Left L4-5 lumbar laminotomy and microdiskectomy with  microdissection.   SURGEON:  Nudelman.   ASSISTANTFranky Macho, MD and Webb Silversmith, RN.   ANESTHESIA:  General endotracheal.   INDICATION:  Patient is a 55 year old woman who presents with a left lumbar  radiculopathy and was found by MRI scan to have a left L4-5 lumbar disk  herniation with a fragment that had migrated caudally behind the body of L5.  A decision was made to proceed with elective laminotomy and microdiskectomy.   PROCEDURE:  Patient was brought to the operating room and placed under  general endotracheal anesthesia.  The patient was turned to a prone  position.  The lumbar region was prepped with Betadine soap and solution and  draped in a sterile fashion.  The midline was infiltrated with local  anesthetic with epinephrine, and an x-ray was taken, the L4-5 level  identified.  An incision was made in the midline over the L4-5 level and  carried down through the subcutaneous tissue.  Bipolar cautery and  electrocautery used to maintain hemostasis.  Dissection was carried down to  the lumbar fascia which was incised in the left side of the midline, and the  paraspinous muscles were dissected off the spinous processes bilaterally in  a subperiosteal fashion.  The L4-5 intralaminar  was identified, and x-ray  was taken and confirmed the localization, and then the microscope was  checked brought into the field to provide additional magnification and  illumination and visualization, and decompression was performed using  microdissection and microsurgical technique.  A laminotomy was performed  using the X-Max drill and Kerrison punches.  The ligamentum flavum was  carefully removed, and we identified the thecal sac and exiting left L5  nerve root.  Mobilizing the nerve root was difficult.  It was stuck down by  inflammation, but we were able to gradually mobilize it and expose a small  edge of the free fragment of disk herniation.  Using a micro hook, we were  able to mobilize this further and began to remove the free fragment in a  piecemeal fashion.  Gradually dissecting with the micro hook, we continued  to mobilize the disk herniation until we were able to fully remove the free  fragment of disk herniation with a pituitary rongeur.  We examined the  epidural space thoroughly, and all loose fragments of disk material had been  removed and good decompression of the thecal sac and nerve root had been  achieved.  Since good decompression was achieved, and decompression had been  from a free fragment, it is felt that there was going to be no benefit to  entering into the disk space and proceeding with an intradiskal diskectomy;  and therefore, the wound was irrigated with bacitracin solution.  Check for  hemostasis was established with the use of bipolar cautery as well as  Gelfoam soaked in thrombin, and once the hemostasis was established, we  removed the Gelfoam, confirmed the hemostasis once again and then instilled  2 mL of fentanyl, 80 mg of Depo-Medrol into the intradural space, and then  proceeded with closure.  The deep fascia was closed with interrupted, undyed  1 Vicryl suture.  Scarpa's fascia was closed with interrupted, undyed 1  Vicryl suture.  The  subcutaneous and subcuticular were closed with inverted  2-0 Vicryl sutures, and the skin was approximated with Dermabond.  The  procedure was tolerated well.  The estimated blood loss was 75 mL.  Sponge  count correct.  Following surgery, the patient was to be turned back to a  supine position, to be reversed from anesthetic, extubated, and transferred  to the recovery room for further care.      Hewitt Shorts, M.D.  Electronically Signed     RWN/MEDQ  D:  09/14/2006  T:  09/15/2006  Job:  [id number]

## 2011-03-25 NOTE — Op Note (Signed)
[facility]  Patient:    Lauren Montes, Lauren Montes                 MRN: [id number] Proc. Date: 01/09/01 Adm. Date:  [id number] Attending:  Sharon Mt                           Operative Report  PREOPERATIVE DIAGNOSIS:  Right adnexal mass.  POSTOPERATIVE DIAGNOSIS:  Benign cystic teratoma of right ovary.  OPERATIVE PROCEDURE:  Exploratory laparotomy with salpingo-oophorectomy.  SURGEON:  Daniel L. Eda Paschal, M.D.  Threasa HeadsAlesia Banda, M.D.  ANESTHESIA:  General endotracheal.  INTRAOPERATIVE FINDINGS:  At the time of laparotomy, the patient had some adhesions of her cecum down in the pelvis to where the bladder flap was from her previous hysterectomy. Once these had been freed up, the patient had a 10 cm smoothly encapsulated neoplasm of the right ovary. The right tube was also in place. There were some adhesions of the neoplasm to the pelvic peritoneum posteriorly. Frozen section came back benign cystic teratoma. Exploration of the abdomen was unremarkable except as discussed above.  DESCRIPTION OF PROCEDURE:  After adequate general endotracheal anesthesia the patient was placed in the supine position and prepped and draped in the usual sterile manner. A Foley catheter was inserted into the patients bladder.  A midline vertical incision was made and carried through the fascia and the peritoneum. The subcutaneous bleeders were Bovied as encountered. When the peritoneal cavity was opened, the above findings were noted. Peritoneal washings were obtained. The cecum was freed up from the pelvis to allow [place] to visualize the pelvic mass. The round ligament was Bovied and cut. The retroperitoneal space was entered. The ureter was identified. The infundibulopelvic ligament was isolated and doubly clamped, cut and doubly suture ligated with #1 chromic catgut. The neoplasm and the ovary and tube were separated from the peritoneum by sharp dissection  taking care to avoid both the bladder and the ureter -- it was finally done intact. The tissue was sent to pathology for frozen section. The ureter and bladder had not been injured and copious irrigation was done with Ringers lactate. There were several little bleeders that were controlled with the Bovie. Frozen section came back benign cystic teratoma. Two sponge, needle, lap and instrument counts were correct. The peritoneum and the fascia were closed with two running #0 PDS sutures tied in the midline and then the skin was closed with staples. Estimated blood loss for the entire procedure was 100 cc with none replaced. The patient tolerated the procedure well and left the operating room in satisfactory condition draining clear urine from a Foley catheter. DD:  01/09/01 TD:  01/10/01 Job: [id number]

## 2011-03-25 NOTE — Op Note (Signed)
NAMEMILLIANA, Lauren Montes              ACCOUNT NO.:  [id number]   MEDICAL RECORD NO.:  [id number]          PATIENT TYPE:  AMB   LOCATION:  DSC                          FACILITY:  [facility]   PHYSICIAN:  Nadara Mustard, MD     DATE OF BIRTH:  19-Mar-1956   DATE OF PROCEDURE:  10/07/2004  DATE OF DISCHARGE:                                 OPERATIVE REPORT   REFERRING PHYSICIAN:  Molly Maduro A. Thurston Hole, M.D.   PREOPERATIVE DIAGNOSES:  1.  Clawing of the left second and third toes.  2.  Hallux valgus deformity left great toe.   PROCEDURES:  1.  Left second and third toe Weil osteotomies.  2.  Left great toe Akin osteotomy.   SURGEON:  Nadara Mustard, M.D.   ANESTHESIA:  Popliteal block.   ESTIMATED BLOOD LOSS:  Minimal.   ANTIBIOTICS:  Kefzol 1 g.   TOURNIQUET TIME:  Esmarch to the ankle for approximately 35 minutes.   DISPOSITION:  PACU in stable condition.   INDICATIONS FOR PROCEDURE:  The patient is a 55 year old woman who is status  post remote hallux valgus procedure for the left great toe.  The patient has  had progressive clawing in the second and third toes as well as hallux  valgus deformity of the great toe and wishes to proceed with surgical  intervention to treat the clawing of the toes and the persistent hallux  valgus deformity.  The patient states he has failed conservative care and  presents at this time for a surgical intervention.  The risks and benefits  were discussed including infection, neurovascular injury, recurrence of the  deformity, need for additional surgery.  The patient states he understands  and wishes to proceed at this time.   DESCRIPTION OF PROCEDURE:  The patient was brought to OR room #8 after  undergoing a popliteal block.  After adequate levels of anesthesia obtained,  the patient's left lower extremity was prepped using DuraPrep and draped  into a sterile field.  The Esmarch was wrapped around the ankle for  tourniquet control.  A medial  longitudinal incision was made over the  proximal phalanx of the great toe.  This was carried down and subperiosteal  dissection was performed.  A wedge ostectomy was performed at the base of  the proximal phalanx.  This underwent a closing wedge and was stabilized  with a 0.0625 K-wire from the distal aspect of the great toe to the first  metatarsal.  This had good stability.  Radiograph showed reduction of the  hallux valgus deformity.  Attention was then focused to the second and third  toes.  She had a previous scar from a Morton's resection from the second and  third metatarsal heads.  This previous incision was used.  This was carried  down to the second web space.  There was no evidence of any recurrent  neuroma at this web space.  Attention was then focused on the second  metatarsal head.  Retractors were placed and a Weil osteotomy was performed  and this was held stabilized with a 12 mm spin screw.  Attention was then  focused on the third metatarsal and a Weil osteotomy was also performed on  the third metatarsal and this was also held stabilized with a 12 mm spin  screw.  The patient had reduction of the clawing of the toes and had no  further deformities after the Weil osteotomies.  All incisions were  irrigated with normal saline.  The wound was closed using 3-0 nylon with a  vertical mattress suture.  The patient was then taken to the PACU in stable  condition.  Plan for follow-up in the office in two weeks with postoperative  shoe, touchdown weightbearing. She has a prescription for Vicodin for pain.      Vernia Buff   MVD/MEDQ  D:  10/07/2004  T:  10/07/2004  Job:  [id number]   cc:   Elana Alm. Thurston Hole, M.D.  [address]  [place] [address]  Fax: [phone]

## 2011-04-18 ENCOUNTER — Other Ambulatory Visit: Payer: Self-pay | Admitting: *Deleted

## 2011-04-18 DIAGNOSIS — E785 Hyperlipidemia, unspecified: Secondary | ICD-10-CM

## 2011-04-19 ENCOUNTER — Other Ambulatory Visit (INDEPENDENT_AMBULATORY_CARE_PROVIDER_SITE_OTHER): Payer: 59 | Admitting: *Deleted

## 2011-04-19 DIAGNOSIS — E785 Hyperlipidemia, unspecified: Secondary | ICD-10-CM

## 2011-04-19 LAB — BASIC METABOLIC PANEL
CO2: 26 mEq/L (ref 19–32)
Chloride: 112 mEq/L (ref 96–112)
Potassium: 4.6 mEq/L (ref 3.5–5.1)
Sodium: 142 mEq/L (ref 135–145)

## 2011-04-19 LAB — HEPATIC FUNCTION PANEL
ALT: 20 U/L (ref 0–35)
Alkaline Phosphatase: 60 U/L (ref 39–117)
Bilirubin, Direct: 0.1 mg/dL (ref 0.0–0.3)
Total Protein: 7 g/dL (ref 6.0–8.3)

## 2011-04-19 LAB — LIPID PANEL
LDL Cholesterol: 76 mg/dL (ref 0–99)
Total CHOL/HDL Ratio: 3

## 2011-04-26 NOTE — Progress Notes (Signed)
Mailed copy after giving verbal.

## 2011-04-29 ENCOUNTER — Ambulatory Visit: Payer: 59 | Admitting: Cardiovascular Disease

## 2011-05-03 ENCOUNTER — Encounter: Payer: Self-pay | Admitting: Cardiovascular Disease

## 2011-05-10 ENCOUNTER — Encounter: Payer: Self-pay | Admitting: Cardiovascular Disease

## 2011-05-10 ENCOUNTER — Ambulatory Visit (INDEPENDENT_AMBULATORY_CARE_PROVIDER_SITE_OTHER): Payer: 59 | Admitting: Cardiovascular Disease

## 2011-05-10 VITALS — BP 124/86 | HR 64 | Ht 66.0 in | Wt 155.0 lb

## 2011-05-10 DIAGNOSIS — M7989 Other specified soft tissue disorders: Secondary | ICD-10-CM

## 2011-05-10 DIAGNOSIS — E785 Hyperlipidemia, unspecified: Secondary | ICD-10-CM

## 2011-05-10 NOTE — Progress Notes (Signed)
Lauren Montes Date of Birth  [birth year] [facility] [address]     [address] [place], [place]  [address] [phone]  Fax  [phone]  History of Present Illness:  Cachet is a middle-age female the history of mild diastolic dysfunction as well as leg edema. She also has a history of hypercholesterolemia. We started her on low-dose Crestor on her last visit. She is tolerating the medicine very well.  She complains of gaining some weight over the past couple of months. [name] been traveling a lot and so she has not been exercising quite as much she would like.   Current Outpatient Prescriptions  Medication Sig Dispense Refill  . calcium-vitamin D (OSCAL WITH D) 500-200 MG-UNIT per tablet Take 1 tablet by mouth 2 (two) times daily.        . cetirizine (ZYRTEC) 10 MG tablet Take 10 mg by mouth daily.        . CRESTOR 5 MG tablet Take 1 tablet by mouth Daily.      Marland Kitchen diltiazem (CARDIZEM CD) 180 MG 24 hr capsule Take 180 mg by mouth daily.        Marland Kitchen estradiol (VIVELLE-DOT) 0.0375 MG/24HR Place 1 patch onto the skin once a week.        . Fluticasone-Salmeterol (ADVAIR DISKUS) 250-50 MCG/DOSE AEPB Inhale 1 puff into the lungs every 12 (twelve) hours.        . methocarbamol (ROBAXIN) 500 MG tablet Take 500 mg by mouth as needed.        . montelukast (SINGULAIR) 10 MG tablet Take 10 mg by mouth at bedtime.        . Multiple Vitamin (MULTIVITAMIN) tablet Take 1 tablet by mouth daily.        . nabumetone (RELAFEN) 750 MG tablet Take 500 mg by mouth 2 (two) times daily.       Marland Kitchen topiramate (TOPAMAX) 25 MG tablet Take 25 mg by mouth daily.        Marland Kitchen DISCONTD: cyclobenzaprine (FLEXERIL) 10 MG tablet Take 10 mg by mouth 3 (three) times daily as needed.        Marland Kitchen DISCONTD: propranolol (INDERAL) 10 MG tablet Take 10 mg by mouth as needed.           Allergies  Allergen Reactions  . Codeine     Past Medical History  Diagnosis Date  . History of  diastolic dysfunction   . Asthma   . History of migraine headaches   . Leg edema     left leg  . SOB (shortness of breath)   . Tachycardia     Past Surgical History  Procedure Date  . Foot surgery     LEFT FOOT  . Cardiovascular stress test 07/02/2008    EF 55-60%    History  Smoking status  . Never Smoker   Smokeless tobacco  . Never Used    History  Alcohol Use No    Family History  Problem Relation Age of Onset  . Coronary artery disease Father   . Hypertension Father     Reviw of Systems:  Reviewed in the HPI.  All other systems are negative.  Physical Exam: BP 124/86  Pulse 64  Ht 5\' 6"  (1.676 m)  Wt 155 lb (70.308 kg)  BMI 25.02 kg/m2 The patient is alert and oriented x 3.  The mood and affect are normal.   Skin: warm and dry.  Color is normal.  HEENT:   the sclera are nonicteric.  The mucous membranes are moist.  The carotids are 2+ without bruits.  There is no thyromegaly.  There is no JVD.    Lungs: clear.  The chest wall is non tender.    Heart: regular rate with a normal S1 and S2.  There are no murmurs, gallops, or rubs. The PMI is not displaced.     Abdomin: good bowel sounds.  There is no guarding or rebound.  There is no hepatosplenomegaly or tenderness.  There are no masses.   Extremities:  Trace edema.  The legs are without rashes.  The distal pulses are intact.   Neuro:  Cranial nerves II - XII are intact.  Motor and sensory functions are intact.    The gait is normal.   Assessment / Plan:

## 2011-05-10 NOTE — Assessment & Plan Note (Signed)
Naija has trace edema of her legs.   Her left leg is slightly more edematous than her right leg. At this point I do not think that she needs any additional medicines. I've asked her to elevate her legs as much as possible and I've asked her to exercise much as possible. We will see her again in one year

## 2011-05-10 NOTE — Assessment & Plan Note (Signed)
Differential is doing fairly well. Her LDL cholesterol was 177 we started the Crestor. Her total cholesterol is 248. [name] tolerating the medicine very well. Her most recent labs reveal an LDL cholesterol that is in the ideal range. We will recheck her labs again in One year.

## 2011-06-22 ENCOUNTER — Other Ambulatory Visit: Payer: Self-pay | Admitting: Obstetrics and Gynecology

## 2011-06-22 DIAGNOSIS — Z1231 Encounter for screening mammogram for malignant neoplasm of breast: Secondary | ICD-10-CM

## 2011-07-29 ENCOUNTER — Ambulatory Visit: Payer: 59

## 2011-08-01 ENCOUNTER — Ambulatory Visit
Admission: RE | Admit: 2011-08-01 | Discharge: 2011-08-01 | Disposition: A | Discharge status: Home / Self Care | Payer: 59 | Source: Ambulatory Visit | Attending: Obstetrics and Gynecology | Admitting: Obstetrics and Gynecology

## 2011-08-01 DIAGNOSIS — Z1231 Encounter for screening mammogram for malignant neoplasm of breast: Secondary | ICD-10-CM

## 2011-08-22 LAB — CBC
HCT: 39.2
Hemoglobin: 13.5
MCHC: 34.3
MCV: 91.4
Platelets: 225
RBC: 4.29
RDW: 12.5
WBC: 7

## 2011-08-31 ENCOUNTER — Institutional Professional Consult (permissible substitution): Payer: 59 | Admitting: Internal Medicine

## 2011-09-03 ENCOUNTER — Other Ambulatory Visit: Payer: Self-pay | Admitting: Cardiology

## 2011-09-09 ENCOUNTER — Encounter: Payer: Self-pay | Admitting: Internal Medicine

## 2011-09-09 ENCOUNTER — Ambulatory Visit (INDEPENDENT_AMBULATORY_CARE_PROVIDER_SITE_OTHER): Payer: 59 | Admitting: Internal Medicine

## 2011-09-09 VITALS — BP 110/72 | HR 90 | Temp 97.9°F | Ht 66.0 in | Wt 157.0 lb

## 2011-09-09 DIAGNOSIS — R0789 Other chest pain: Secondary | ICD-10-CM | POA: Insufficient documentation

## 2011-09-09 DIAGNOSIS — R05 Cough: Secondary | ICD-10-CM

## 2011-09-09 DIAGNOSIS — R059 Cough, unspecified: Secondary | ICD-10-CM

## 2011-09-09 MED ORDER — FLUTICASONE PROPIONATE 50 MCG/ACT NA SUSP: 2.0000 | Freq: Every day | NASAL | Status: DC

## 2011-09-09 NOTE — Progress Notes (Signed)
Subjective:    Patient ID: Lauren Montes, female    DOB: 02/22/1956, 55 y.o.   MRN: [id number]  HPI  55 year old female, never smoker . On cardizem for 3 years  some kind of tachycardia. Known spring allergies and asthmatic - advair and singulair since 2000/2002. Diagnosis of asthma made by PMD Dr [name] on basis of dyspnea, wheezing and chest discomfort. Recollects spirometry which she believes was abnormal. Denies ER visit or admissions for asthma. But has been on prednisone < 10 times in 10 years.   Referred by Dr [name].   Chronic cough since late spring/early summer 2012. Insidious onset. Does not remember having URI or acute bronchitis at onset.  In August 2012 saw PMD. Initially given antibiotic but it did not help (CXR at that time was clear per hx). In late august change of advair to symbicort did not help. Cough persisted through September without relief. Got another round of antibiotic and prednisone 6 day in early to mid October and this helped a lot. Cough is now improved since improved.  Dry barking cough quality. Day and night.  Recent prednisone (early to mid-oct 2012 x 6 days) helped a lot esp dyspnea, hoaseness, fatigue and chest tightness and cough though improvement in cough is the least Reports associated hoarseness. Associated chest tightness ('elephant in chest' feeling). Associated sinus drainage +.  , Feeliing of tickle in throat + Current post prednisone RSI score is 13 (Level 2: hoarseness, post nasal drip, cough after lying down,  troublesome cough, heartburn. Level 3: breathing or choking episodes)   Reports chest tightness/pain  - sternal area, worsened by inspiration and supine position. Relieved by sleeping propped up on pillows. Pain started in august 2012.Pain present on daily basis. Constant day and night. Present even at rest. Worsened also by body movements.   Reports self diagnosed GERD x 6 months. Eats cheese. Daily wine +.Daily tea +. Feels water brash in  neck with burning sensation esp at night and after lying down  She feels this is different from asthma because of the chronic cough, and chest tightness,   Labs: Normal CT chest March 2011 and PE ruled out.  Spirometry today is normal  Review of Systems  Constitutional: Negative for fever and unexpected weight change.  HENT: Positive for congestion. Negative for ear pain, nosebleeds, sore throat, rhinorrhea, sneezing, trouble swallowing, dental problem, postnasal drip and sinus pressure.   Eyes: Negative for redness and itching.  Respiratory: Positive for cough and shortness of breath. Negative for chest tightness and wheezing.   Cardiovascular: Positive for chest pain and palpitations. Negative for leg swelling.  Gastrointestinal: Negative for nausea and vomiting.  Genitourinary: Negative for dysuria.  Musculoskeletal: Positive for joint swelling.  Skin: Negative for rash.  Neurological: Positive for headaches.  Hematological: Does not bruise/bleed easily.  Psychiatric/Behavioral: Negative for dysphoric mood. The patient is not nervous/anxious.        Objective:   Physical Exam  Vitals reviewed. Constitutional: She is oriented to person, place, and time. She appears well-developed and well-nourished. No distress.  HENT:  Head: Normocephalic and atraumatic.  Right Ear: External ear normal.  Left Ear: External ear normal.  Mouth/Throat: Oropharynx is clear and moist. No oropharyngeal exudate.  Eyes: Conjunctivae and EOM are normal. Pupils are equal, round, and reactive to light. Right eye exhibits no discharge. Left eye exhibits no discharge. No scleral icterus.  Neck: Normal range of motion. Neck supple. No JVD present. No tracheal deviation present. No  thyromegaly present.  Cardiovascular: Normal rate, regular rhythm, normal heart sounds and intact distal pulses.  Exam reveals no gallop and no friction rub.   No murmur heard. Pulmonary/Chest: Effort normal and breath sounds  normal. No respiratory distress. She has no wheezes. She has no rales. She exhibits no tenderness.  Abdominal: Soft. Bowel sounds are normal. She exhibits no distension and no mass. There is no tenderness. There is no rebound and no guarding.  Musculoskeletal: Normal range of motion. She exhibits no edema and no tenderness.  Lymphadenopathy:    She has no cervical adenopathy.  Neurological: She is alert and oriented to person, place, and time. She has normal reflexes. No cranial nerve deficit. She exhibits normal muscle tone. Coordination normal.  Skin: Skin is warm and dry. No rash noted. She is not diaphoretic. No erythema. No pallor.  Psychiatric: She has a normal mood and affect. Her behavior is normal. Judgment and thought content normal.          Assessment & Plan:

## 2011-09-09 NOTE — Patient Instructions (Addendum)
Cough is from sinus drainage, acid reflux,  All of this is working together to cause  Cough Asthma can also cause cough but this appears stable at the moment and is possible that large particles of symbicort or advair are making cough worse  #Sinus drainage  - start netti pot atleast 3 times a week; nurse will show picture - start nasal steroid inhaler 2 squirts each nostril daily as advised; use generic fluticasone - stop zyrtec - start chlorpheniramine 4mg  x 2 tablets each night; if this makes you too dry or sleepy cut down to one tablet (take script) -  #Possible Acid Reflux  - take otc zegerid 20 1 capsule daily on empty stomach; take script   - take diet sheet from [place] - avoid colas, spices, cheeses, spirits, red meats, beer, chocolates, fried foods etc.,   - sleep with head end of bed elevated  - eat small frequent meals  - do not go to bed for 3 hours after last meal #Asthma  - stop symbicort  - start QVAR 2 puff twice daily - take samples and show inhaler technique - continue singulair #OTHers  - at all times there  there is urge to cough, drink water or swallow or sip on throat lozenge  #Followup - I will see you in 4-5 weeks.  - spirometry at followup - any problems call or come sooner

## 2011-09-09 NOTE — Assessment & Plan Note (Signed)
Very atypical for cardiac Suspect GERD Will see how this responds to measures in cough section

## 2011-09-09 NOTE — Assessment & Plan Note (Signed)
Cough is from sinus drainage, and acid reflux. All of this is working together to cause  Cough. Asthma can also cause cough but this appears stable at the moment and is possible that large particles of symbicort or advair are making cough worse  #Sinus drainage  - start netti pot atleast 3 times a week; nurse will show picture - start nasal steroid inhaler 2 squirts each nostril daily as advised; use generic fluticasone - stop zyrtec - start chlorpheniramine 4mg  x 2 tablets each night; if this makes you too dry or sleepy cut down to one tablet (take script) -  #Possible Acid Reflux  - take otc zegerid 20 1 capsule daily on empty stomach; take script   - take diet sheet from [place] - avoid colas, spices, cheeses, spirits, red meats, beer, chocolates, fried foods etc.,   - sleep with head end of bed elevated  - eat small frequent meals  - do not go to bed for 3 hours after last meal #Asthma  - stop symbicort  - start QVAR 2 puff twice daily - take samples and show inhaler technique - continue singulair #OTHers  - at all times there  there is urge to cough, drink water or swallow or sip on throat lozenge  #Followup - I will see you in 4-5 weeks.  - spirometry at followup - any problems call or come sooner

## 2011-09-23 ENCOUNTER — Encounter: Payer: Self-pay | Admitting: *Deleted

## 2011-09-23 ENCOUNTER — Encounter: Payer: Self-pay | Admitting: Women's Health

## 2011-09-23 ENCOUNTER — Ambulatory Visit (INDEPENDENT_AMBULATORY_CARE_PROVIDER_SITE_OTHER): Payer: 59 | Admitting: Women's Health

## 2011-09-23 DIAGNOSIS — B373 Candidiasis of vulva and vagina: Secondary | ICD-10-CM

## 2011-09-23 DIAGNOSIS — B3731 Acute candidiasis of vulva and vagina: Secondary | ICD-10-CM

## 2011-09-23 DIAGNOSIS — N898 Other specified noninflammatory disorders of vagina: Secondary | ICD-10-CM

## 2011-09-23 DIAGNOSIS — L293 Anogenital pruritus, unspecified: Secondary | ICD-10-CM

## 2011-09-23 MED ORDER — TERCONAZOLE 0.8 % VA CREA: 1.0000 | TOPICAL_CREAM | Freq: Every day | VAGINAL | Status: AC

## 2011-09-23 MED ORDER — FLUCONAZOLE 150 MG PO TABS: 150.0000 mg | ORAL_TABLET | Freq: Once | ORAL | Status: AC

## 2011-09-23 NOTE — Progress Notes (Signed)
Addended by: Landis Martins R on: 09/23/2011 12:41 PM   Modules accepted: Orders

## 2011-09-23 NOTE — Progress Notes (Signed)
Presents with a complaint of vaginal itching and slight burning upon urination. Denies pain at end of stream or fever. Used over-the-counter Monistat without relief. Was treated with antibiotics twice for URI last month. UA negative  Exam: External genitalia slight erythema at introitus, speculum exam moderate amount of  white discharge, vaginal walls are erythematous. Will treat with Terazol 3 one applicator at bedtime x3 refill was given in case treated with antibiotics again. She does have a history of asthma. Instructed to call if no relief

## 2011-09-26 NOTE — Progress Notes (Signed)
Addended by: Landis Martins R on: 09/26/2011 10:22 AM   Modules accepted: Orders

## 2011-10-17 ENCOUNTER — Ambulatory Visit (INDEPENDENT_AMBULATORY_CARE_PROVIDER_SITE_OTHER): Payer: 59 | Admitting: Internal Medicine

## 2011-10-17 ENCOUNTER — Encounter: Payer: Self-pay | Admitting: Internal Medicine

## 2011-10-17 VITALS — BP 110/68 | HR 88 | Temp 97.8°F | Ht 66.0 in | Wt 152.2 lb

## 2011-10-17 DIAGNOSIS — R05 Cough: Secondary | ICD-10-CM

## 2011-10-17 DIAGNOSIS — R059 Cough, unspecified: Secondary | ICD-10-CM

## 2011-10-17 NOTE — Progress Notes (Signed)
Subjective:    Patient ID: Lauren Montes, female    DOB: Oct 10, 1956, 55 y.o.   MRN: [id number]  HPI 55 year old female, never smoker . On cardizem for 3 years  some kind of tachycardia. Known spring allergies and asthmatic - advair and singulair since 2000/2002. Diagnosis of asthma made by PMD Dr [name] on basis of dyspnea, wheezing and chest discomfort. Recollects spirometry which she believes was abnormal. Denies ER visit or admissions for asthma. But has been on prednisone < 10 times in 10 years.   Referred by Dr [name].   Chronic cough since late spring/early summer 2012. Insidious onset. Does not remember having URI or acute bronchitis at onset.  In August 2012 saw PMD. Initially given antibiotic but it did not help (CXR at that time was clear per hx). In late august change of advair to symbicort did not help. Cough persisted through September without relief. Got another round of antibiotic and prednisone 6 day in early to mid October and this helped a lot. Cough is now improved since improved.  Dry barking cough quality. Day and night.  Recent prednisone (early to mid-oct 2012 x 6 days) helped a lot esp dyspnea, hoaseness, fatigue and chest tightness and cough though improvement in cough is the least Reports associated hoarseness. Associated chest tightness ('elephant in chest' feeling). Associated sinus drainage +.  , Feeliing of tickle in throat + Current post prednisone RSI score is 13 (Level 2: hoarseness, post nasal drip, cough after lying down,  troublesome cough, heartburn. Level 3: breathing or choking episodes)   Reports chest tightness/pain  - sternal area, worsened by inspiration and supine position. Relieved by sleeping propped up on pillows. Pain started in august 2012.Pain present on daily basis. Constant day and night. Present even at rest. Worsened also by body movements.   Reports self diagnosed GERD x 6 months. Eats cheese. Daily wine +.Daily tea +. Feels water brash in  neck with burning sensation esp at night and after lying down  She feels this is different from asthma because of the chronic cough, and chest tightness,   Labs: Normal CT chest March 2011 and PE ruled out.  Spirometry today is normal  REC Cough is from sinus drainage, acid reflux,  All of this is working together to cause  Cough Asthma can also cause cough but this appears stable at the moment and is possible that large particles of symbicort or advair are making cough worse  #Sinus drainage  - start netti pot atleast 3 times a week; nurse will show picture - start nasal steroid inhaler 2 squirts each nostril daily as advised; use generic fluticasone - stop zyrtec - start chlorpheniramine 4mg  x 2 tablets each night; if this makes you too dry or sleepy cut down to one tablet (take script) -  #Possible Acid Reflux  - take otc zegerid 20mg ,  1 capsule daily on empty stomach; take script   - take diet sheet from [place] - avoid colas, spices, cheeses, spirits, red meats, beer, chocolates, fried foods etc.,   - sleep with head end of bed elevated  - eat small frequent meals  - do not go to bed for 3 hours after last meal #Asthma  - stop symbicort  - start QVAR 2 puff twice daily - take samples and show inhaler technique - continue singulair #OTHers  - at all times there  there is urge to cough, drink water or swallow or sip on throat lozenge  #  Followup - I will see you in 4-5 weeks.  - spirometry at followup - any problems call or come sooner   OV 10/17/2011  Cough much improved. RSI score down 50% to 7 (level 4 - post nasal drip. Level 2 - heart burn. Level 1 - annying cough). Much better but not completely better and still with residual cough.  Compliant with above. Still with significant sinus drainage and sneezing despite netti pot, nasal steroid, singulair and allergy tab. She did notice that once when she stopped PPI, heart burn got worse. Tolerating qvar and singulair well.   Sinus drainage is biggest issue; feels zyrtec was better than chlorpheniramine. No hx of ENT or allergy workup. Denies nasal trauma. Sinus drainage is chronic and recollects this was a problem in teen years as well. Associated headaches with sinus drainage present.  Sinus drainage worse in fall and spring but better in summer and winter. Denies skin rash.  She feels she has allergies and feels a referral will hellp. Of note, coming off advair - has helped resolve her tachycardia (dr [name]). Sprimetry today is normal  Fam hx: mom with allergies - sees Dr [name]  Social: planning xmas with 3 children who live in [facility] [place]  Past: No change  Review of Systems  Constitutional: Negative for fever and unexpected weight change.  HENT: Positive for rhinorrhea, sneezing and postnasal drip. Negative for ear pain, nosebleeds, congestion, sore throat, trouble swallowing, dental problem and sinus pressure.   Eyes: Negative for redness and itching.  Respiratory: Positive for cough. Negative for chest tightness, shortness of breath and wheezing.   Cardiovascular: Negative for palpitations and leg swelling.  Gastrointestinal: Negative for nausea, vomiting and diarrhea.  Genitourinary: Negative for dysuria.  Musculoskeletal: Negative for joint swelling.  Skin: Negative for rash.  Neurological: Negative for headaches.  Hematological: Does not bruise/bleed easily.  Psychiatric/Behavioral: Negative for dysphoric mood. The patient is not nervous/anxious.        Objective:   Physical Exam  Vitals reviewed. Constitutional: She is oriented to person, place, and time. She appears well-developed and well-nourished. No distress.  HENT:  Head: Normocephalic and atraumatic.  Right Ear: External ear normal.  Left Ear: External ear normal.  Mouth/Throat: Oropharynx is clear and moist. No oropharyngeal exudate.       Obvious post nasal drip in uvula +  Eyes: Conjunctivae and EOM are normal.  Pupils are equal, round, and reactive to light. Right eye exhibits no discharge. Left eye exhibits no discharge. No scleral icterus.  Neck: Normal range of motion. Neck supple. No JVD present. No tracheal deviation present. No thyromegaly present.  Cardiovascular: Normal rate, regular rhythm, normal heart sounds and intact distal pulses.  Exam reveals no gallop and no friction rub.   No murmur heard. Pulmonary/Chest: Effort normal and breath sounds normal. No respiratory distress. She has no wheezes. She has no rales. She exhibits no tenderness.  Abdominal: Soft. Bowel sounds are normal. She exhibits no distension and no mass. There is no tenderness. There is no rebound and no guarding.  Musculoskeletal: Normal range of motion. She exhibits no edema and no tenderness.  Lymphadenopathy:    She has no cervical adenopathy.  Neurological: She is alert and oriented to person, place, and time. She has normal reflexes. No cranial nerve deficit. She exhibits normal muscle tone. Coordination normal.  Skin: Skin is warm and dry. No rash noted. She is not diaphoretic. No erythema. No pallor.  Psychiatric: She has a  normal mood and affect. Her behavior is normal. Judgment and thought content normal.          Assessment & Plan:

## 2011-10-17 NOTE — Patient Instructions (Signed)
Cough is from sinus drainage, acid reflux. Of this it appears that sinus drainage is biggest isue All of this is working together to cause  Cough  #Sinus drainage  - continuet netti pot atleast 3 times a week; nurse will show picture - continue  nasal steroid inhaler 2 squirts each nostril daily as advised; use generic fluticasone - restart  zyrtec - stop  chlorpheniramine  #Possible Acid Reflux  - take otc zegerid 20 1 capsule daily on empty stomach; take script   - continue diet sheet  - avoid colas, spices, cheeses, spirits, red meats, beer, chocolates, fried foods etc.,   - sleep with head end of bed elevated  - eat small frequent meals  - do not go to bed for 3 hours after last meal #Asthma  - ccontinue QVAR 2 puff twice daily - take samples and show inhaler technique - continue singulair - at some point in future we can retest for asthma; today breathing test on qvar is normal  #OTHers  - at all times there  there is urge to cough, drink water or swallow or sip on throat lozenge   #Followup - we will refer you to DR [name] your mum's allergist for allergy opinion  - Followup with me in 2 -3 months

## 2011-10-18 ENCOUNTER — Encounter: Payer: Self-pay | Admitting: Internal Medicine

## 2011-10-18 NOTE — Assessment & Plan Note (Signed)
Much improved cough. Significant post nasal drainage is the bigggest issue. In fact exam shows obvious post nasal drainage despite multiple Rx against it. It appears problems likely allergic. Her mom sees dr [name] and she prefers going to see her. So, will refer her there. SHe is tolerating the switch from advair to qvar well in terms of 'asthma'. Appears tachycardia improved. At some point, will consider getting her off qvar and testing methacholine challenge test. Rest per below   Cough is from sinus drainage, acid reflux. Of this it appears that sinus drainage is biggest isue All of this is working together to cause  Cough  #Sinus drainage  - continuet netti pot atleast 3 times a week; nurse will show picture - continue  nasal steroid inhaler 2 squirts each nostril daily as advised; use generic fluticasone - restart  zyrtec - stop  chlorpheniramine  #Possible Acid Reflux  - take otc zegerid 20 1 capsule daily on empty stomach; take script   - continue diet sheet  - avoid colas, spices, cheeses, spirits, red meats, beer, chocolates, fried foods etc.,   - sleep with head end of bed elevated  - eat small frequent meals  - do not go to bed for 3 hours after last meal #Asthma  - ccontinue QVAR 2 puff twice daily - take samples and show inhaler technique - continue singulair - at some point in future we can retest for asthma; today breathing test on qvar is normal  #OTHers  - at all times there  there is urge to cough, drink water or swallow or sip on throat lozenge   #Followup - we will refer you to DR [name] your mum's allergist for allergy opinion  - Followup with me in 2 -3 months

## 2011-12-08 ENCOUNTER — Other Ambulatory Visit: Payer: Self-pay | Admitting: *Deleted

## 2011-12-08 ENCOUNTER — Telehealth: Payer: Self-pay | Admitting: Cardiovascular Disease

## 2011-12-08 MED ORDER — ATORVASTATIN CALCIUM 20 MG PO TABS: 20.0000 mg | ORAL_TABLET | Freq: Every day | ORAL | Status: DC

## 2011-12-08 NOTE — Telephone Encounter (Signed)
Please sugg diff cholesterol med that insurance will pay. Generic equivalents for; Lipitor, lovastatin, Zocor, pravochol, Lescol  or Lescol xl,  vytorin. Please advise.

## 2011-12-08 NOTE — Telephone Encounter (Signed)
New problem Pt said her insurance wont pay for crestor. She would like to talk to you about it.

## 2011-12-08 NOTE — Telephone Encounter (Signed)
Pt switched cholesterol med due to insurance not covering med, redraw app set.

## 2011-12-08 NOTE — Telephone Encounter (Signed)
I would rec. Atorvastatin 20 a day.  Check lipids, hfp in 3 months

## 2011-12-30 ENCOUNTER — Encounter: Payer: Self-pay | Admitting: Internal Medicine

## 2011-12-30 ENCOUNTER — Telehealth: Payer: Self-pay | Admitting: Internal Medicine

## 2011-12-30 ENCOUNTER — Ambulatory Visit (INDEPENDENT_AMBULATORY_CARE_PROVIDER_SITE_OTHER): Payer: Managed Care, Other (non HMO) | Admitting: Internal Medicine

## 2011-12-30 VITALS — BP 110/72 | HR 86 | Temp 98.0°F | Ht 66.0 in | Wt 154.0 lb

## 2011-12-30 DIAGNOSIS — R059 Cough, unspecified: Secondary | ICD-10-CM

## 2011-12-30 DIAGNOSIS — R05 Cough: Secondary | ICD-10-CM

## 2011-12-30 NOTE — Telephone Encounter (Signed)
DC diltiazem.  Her tachycardia has improved after pulmonary changed one of her meds.    Keep apt in May.  I have called patient to inform her to stop dilt.

## 2011-12-30 NOTE — Telephone Encounter (Signed)
Hello Phil  I saw this lady for cough and after we changed advair to QVAR her tachycardia resolved. She thinks she can come off her cardizem. Please evaluate  Thanks Carmin Muskrat

## 2011-12-30 NOTE — Patient Instructions (Signed)
Cough is from sinus drainage, acid reflux and likely asthma. Of this it appears that sinus drainage is biggest isue All of this is working together to cause  Cough Glad you are significantly better  #Sinus drainage  - continuet netti pot atleast 3 times a week; nurse will show picture - continue  nasal steroid inhaler 2 squirts each nostril daily as advised; use generic fluticasone - continue antihistamine per Dr Whalen  - continue singulair per Dr Whalen #Possible Acid Reflux  - continue otc zegerid 20 1 capsule daily on empty stomach; take script   - continue diet sheet  - avoid colas, spices, cheeses, spirits, red meats, beer, chocolates, fried foods etc.,   - sleep with head end of bed elevated  - eat small frequent meals  - do not go to bed for 3 hours after last meal #Asthma  - continue QVAR 80mcg 2 puff twice daily - take samples and show inhaler technique -  at some point in future we can retest for asthma; today breathing test on qvar is normal #Allergies  - I will clarify with Dr Whalen if there is any role for blood testing  - please sign release to records from Dr Whalen  #OTHers  - at all times there  there is urge to cough, drink water or swallow or sip on throat lozenge   #Followup - Followup in summer July 2012  - call or come sooner if there are problems 

## 2011-12-30 NOTE — Assessment & Plan Note (Signed)
Cough is from sinus drainage, acid reflux and likely asthma. Of this it appears that sinus drainage is biggest isue All of this is working together to cause  Cough Glad you are significantly better  #Sinus drainage  - continuet netti pot atleast 3 times a week; nurse will show picture - continue  nasal steroid inhaler 2 squirts each nostril daily as advised; use generic fluticasone - continue antihistamine per Dr [name]  - continue singulair per Dr [name] #Possible Acid Reflux  - continue otc zegerid 20 1 capsule daily on empty stomach; take script   - continue diet sheet  - avoid colas, spices, cheeses, spirits, red meats, beer, chocolates, fried foods etc.,   - sleep with head end of bed elevated  - eat small frequent meals  - do not go to bed for 3 hours after last meal #Asthma  - continue QVAR 2 puff twice daily - take samples and show inhaler technique -  at some point in future we can retest for asthma; today breathing test on qvar is normal #Allergies  - I will clarify with Dr [name] if there is any role for blood testing  - please sign release to records from Dr [name]  #OTHers  - at all times there  there is urge to cough, drink water or swallow or sip on throat lozenge   #Followup - Followup in summer July 2012  - call or come sooner if there are problems

## 2011-12-30 NOTE — Progress Notes (Signed)
Subjective:    Patient ID: Lauren Montes, female    DOB: Oct 10, 1956, 56 y.o.   MRN: [id number]  HPI 56 year old female, never smoker . On cardizem for 3 years  some kind of tachycardia. Known spring allergies and asthmatic - advair and singulair since 2000/2002. Diagnosis of asthma made by PMD Dr [name] on basis of dyspnea, wheezing and chest discomfort. Recollects spirometry which she believes was abnormal. Denies ER visit or admissions for asthma. But has been on prednisone < 10 times in 10 years.   Referred by Dr [name].   Chronic cough since late spring/early summer 2012. Insidious onset. Does not remember having URI or acute bronchitis at onset.  In August 2012 saw PMD. Initially given antibiotic but it did not help (CXR at that time was clear per hx). In late august change of advair to symbicort did not help. Cough persisted through September without relief. Got another round of antibiotic and prednisone 6 day in early to mid October and this helped a lot. Cough is now improved since improved.  Dry barking cough quality. Day and night.  Recent prednisone (early to mid-oct 2012 x 6 days) helped a lot esp dyspnea, hoaseness, fatigue and chest tightness and cough though improvement in cough is the least Reports associated hoarseness. Associated chest tightness ('elephant in chest' feeling). Associated sinus drainage +.  , Feeliing of tickle in throat + Current post prednisone RSI score is 13 (Level 2: hoarseness, post nasal drip, cough after lying down,  troublesome cough, heartburn. Level 3: breathing or choking episodes)   Reports chest tightness/pain  - sternal area, worsened by inspiration and supine position. Relieved by sleeping propped up on pillows. Pain started in august 2012.Pain present on daily basis. Constant day and night. Present even at rest. Worsened also by body movements.   Reports self diagnosed GERD x 6 months. Eats cheese. Daily wine +.Daily tea +. Feels water brash in  neck with burning sensation esp at night and after lying down  She feels this is different from asthma because of the chronic cough, and chest tightness,   Labs: Normal CT chest March 2011 and PE ruled out.  Spirometry today is normal  REC Cough is from sinus drainage, acid reflux,  All of this is working together to cause  Cough Asthma can also cause cough but this appears stable at the moment and is possible that large particles of symbicort or advair are making cough worse  #Sinus drainage  - start netti pot atleast 3 times a week; nurse will show picture - start nasal steroid inhaler 2 squirts each nostril daily as advised; use generic fluticasone - stop zyrtec - start chlorpheniramine 4mg  x 2 tablets each night; if this makes you too dry or sleepy cut down to one tablet (take script) -  #Possible Acid Reflux  - take otc zegerid 20mg ,  1 capsule daily on empty stomach; take script   - take diet sheet from [place] - avoid colas, spices, cheeses, spirits, red meats, beer, chocolates, fried foods etc.,   - sleep with head end of bed elevated  - eat small frequent meals  - do not go to bed for 3 hours after last meal #Asthma  - stop symbicort  - start QVAR 2 puff twice daily - take samples and show inhaler technique - continue singulair #OTHers  - at all times there  there is urge to cough, drink water or swallow or sip on throat lozenge  #  Followup - I will see you in 4-5 weeks.  - spirometry at followup - any problems call or come sooner   OV 10/17/2011  Cough much improved. RSI score down 50% to 7 (level 4 - post nasal drip. Level 2 - heart burn. Level 1 - annying cough). Much better but not completely better and still with residual cough.  Compliant with above. Still with significant sinus drainage and sneezing despite netti pot, nasal steroid, singulair and allergy tab. She did notice that once when she stopped PPI, heart burn got worse. Tolerating qvar and singulair well.   Sinus drainage is biggest issue; feels zyrtec was better than chlorpheniramine. No hx of ENT or allergy workup. Denies nasal trauma. Sinus drainage is chronic and recollects this was a problem in teen years as well. Associated headaches with sinus drainage present.  Sinus drainage worse in fall and spring but better in summer and winter. Denies skin rash.  She feels she has allergies and feels a referral will hellp. Of note, coming off advair - has helped resolve her tachycardia (dr [name]). Sprimetry today is normal  Fam hx: mom with allergies - sees Dr [name]  Social: planning xmas with 3 children who [place] [facility] [place]  Past: No change  #Sinus drainage  - continuet netti pot atleast 3 times a week; nurse will show picture  - continue nasal steroid inhaler 2 squirts each nostril daily as advised; use generic fluticasone  - restart zyrtec  - stop chlorpheniramine  #Possible Acid Reflux  - take otc zegerid 20 1 capsule daily on empty stomach; take script  - continue diet sheet - avoid colas, spices, cheeses, spirits, red meats, beer, chocolates, fried foods etc.,  - sleep with head end of bed elevated  - eat small frequent meals  - do not go to bed for 3 hours after last meal  #Asthma  - ccontinue QVAR 2 puff twice daily - take samples and show inhaler technique  - continue singulair  - at some point in future we can retest for asthma; today breathing test on qvar is normal  #OTHers  - at all times there there is urge to cough, drink water or swallow or sip on throat lozenge  #Followup  - we will refer you to DR [name] your mum's allergist for allergy opinion  - Followup with me in 2 -3 months   OV 12/30/2011 Followup cough. She has followed all of above instructions diligently. She saw Dr [name] and per hx (notes not available) skin testing showed positive dust mite allergy. No serum testing. Asked to continue singulair, anti-histamine otc and nasal steroid  indefinitely. No fu planned apparently. Currnetly RSI cough score is down to 3  (level 1 post nasal drip, sensation of something in throat and heartburn). No new symptoms. She is content with current Rx plan and wants to continue it through the spring when allergy season provokes cough  Past, Family, Social reviewed: no change since last visit.  She is not having issues with tachycardia anymore. She feels this was due to advair and plans to talk to Dr [name] about dc cardizem.    Review of Systems  Constitutional: Negative for fever and unexpected weight change.  HENT: Negative for ear pain, nosebleeds, congestion, sore throat, rhinorrhea, sneezing, trouble swallowing, dental problem, postnasal drip and sinus pressure.   Eyes: Negative for redness and itching.  Respiratory: Negative for cough, chest tightness, shortness of breath and wheezing.  Cardiovascular: Negative for palpitations and leg swelling.  Gastrointestinal: Negative for nausea and vomiting.  Genitourinary: Negative for dysuria.  Musculoskeletal: Negative for joint swelling.  Skin: Negative for rash.  Neurological: Negative for headaches.  Hematological: Does not bruise/bleed easily.  Psychiatric/Behavioral: Negative for dysphoric mood. The patient is not nervous/anxious.        Objective:   Physical Exam Vitals reviewed. Constitutional: She is oriented to person, place, and time. She appears well-developed and well-nourished. No distress.  HENT:  Head: Normocephalic and atraumatic.  Right Ear: External ear normal.  Left Ear: External ear normal.  Mouth/Throat: Oropharynx is clear and moist. No oropharyngeal exudate.       Obvious post nasal drip in uvula +  Eyes: Conjunctivae and EOM are normal. Pupils are equal, round, and reactive to light. Right eye exhibits no discharge. Left eye exhibits no discharge. No scleral icterus.  Neck: Normal range of motion. Neck supple. No JVD present. No tracheal deviation present. No  thyromegaly present.  Cardiovascular: Normal rate, regular rhythm, normal heart sounds and intact distal pulses.  Exam reveals no gallop and no friction rub.   No murmur heard. Pulmonary/Chest: Effort normal and breath sounds normal. No respiratory distress. She has no wheezes. She has no rales. She exhibits no tenderness.  Abdominal: Soft. Bowel sounds are normal. She exhibits no distension and no mass. There is no tenderness. There is no rebound and no guarding.  Musculoskeletal: Normal range of motion. She exhibits no edema and no tenderness.  Lymphadenopathy:    She has no cervical adenopathy.  Neurological: She is alert and oriented to person, place, and time. She has normal reflexes. No cranial nerve deficit. She exhibits normal muscle tone. Coordination normal.  Skin: Skin is warm and dry. No rash noted. She is not diaphoretic. No erythema. No pallor.  Psychiatric: She has a normal mood and affect. Her behavior is normal. Judgment and thought content normal.         Assessment & Plan:

## 2012-02-14 ENCOUNTER — Other Ambulatory Visit: Payer: Self-pay | Admitting: Obstetrics and Gynecology

## 2012-02-15 ENCOUNTER — Encounter: Payer: Self-pay | Admitting: Obstetrics and Gynecology

## 2012-02-15 ENCOUNTER — Ambulatory Visit (INDEPENDENT_AMBULATORY_CARE_PROVIDER_SITE_OTHER): Payer: Managed Care, Other (non HMO) | Admitting: Obstetrics and Gynecology

## 2012-02-15 VITALS — BP 120/74 | Ht 66.0 in | Wt 150.0 lb

## 2012-02-15 DIAGNOSIS — K219 Gastro-esophageal reflux disease without esophagitis: Secondary | ICD-10-CM | POA: Insufficient documentation

## 2012-02-15 DIAGNOSIS — L718 Other rosacea: Secondary | ICD-10-CM | POA: Insufficient documentation

## 2012-02-15 DIAGNOSIS — Z01419 Encounter for gynecological examination (general) (routine) without abnormal findings: Secondary | ICD-10-CM

## 2012-02-15 MED ORDER — ESTRADIOL 0.0375 MG/24HR TD PTWK: 1.0000 | MEDICATED_PATCH | TRANSDERMAL | Status: DC

## 2012-02-15 NOTE — Progress Notes (Signed)
Patient came to see me today for her annual GYN exam. She is up-to-date on mammograms. She has had 2 normal bone densities. She does her lab through her PCP. She remains on estrogen patch with excellent results for her vasomotor symptoms. She usually takes a two day break between patches. She does this because of mastodynia. She will sometimes notice during a break that her menopausal symptoms come back. She has no vaginal dryness. She is having no pelvic pain. She is having no vaginal bleeding.  HEENT: Within normal limits. [name] present. Neck: No masses. Supraclavicular lymph nodes: Not enlarged. Breasts: Examined in both sitting and lying position. Symmetrical without skin changes or masses. Abdomen: Soft no masses guarding or rebound. No hernias. Pelvic: External within normal limits. BUS within normal limits. Vaginal examination shows good estrogen effect, no cystocele enterocele or rectocele. Cervix and uterus absent. Adnexa within normal limits. Rectovaginal confirmatory. Extremities within normal limits.  Assessment: Menopausal symptoms  Plan: Continue Climara patch. Continue yearly mammograms.

## 2012-02-16 LAB — URINALYSIS W MICROSCOPIC + REFLEX CULTURE
Bilirubin Urine: NEGATIVE
Crystals: NONE SEEN
Nitrite: NEGATIVE
Specific Gravity, Urine: 1.026 (ref 1.005–1.030)
Squamous Epithelial / LPF: NONE SEEN
Urobilinogen, UA: 0.2 mg/dL (ref 0.0–1.0)
pH: 6 (ref 5.0–8.0)

## 2012-02-20 ENCOUNTER — Telehealth: Payer: Self-pay | Admitting: Internal Medicine

## 2012-02-20 MED ORDER — BECLOMETHASONE DIPROPIONATE 80 MCG/ACT IN AERS: 2.0000 | INHALATION_SPRAY | Freq: Two times a day (BID) | RESPIRATORY_TRACT | Status: DC

## 2012-02-20 NOTE — Telephone Encounter (Signed)
Pt aware that Rx has been sent to [facility].

## 2012-03-08 ENCOUNTER — Other Ambulatory Visit: Payer: 59 | Admitting: *Deleted

## 2012-04-03 ENCOUNTER — Other Ambulatory Visit (INDEPENDENT_AMBULATORY_CARE_PROVIDER_SITE_OTHER): Payer: Managed Care, Other (non HMO)

## 2012-04-03 DIAGNOSIS — E785 Hyperlipidemia, unspecified: Secondary | ICD-10-CM

## 2012-04-03 LAB — HEPATIC FUNCTION PANEL
Alkaline Phosphatase: 55 U/L (ref 39–117)
Bilirubin, Direct: 0 mg/dL (ref 0.0–0.3)
Total Bilirubin: 0.7 mg/dL (ref 0.3–1.2)

## 2012-04-03 LAB — LIPID PANEL
LDL Cholesterol: 61 mg/dL (ref 0–99)
Total CHOL/HDL Ratio: 2

## 2012-04-03 LAB — BASIC METABOLIC PANEL
BUN: 12 mg/dL (ref 6–23)
CO2: 28 mEq/L (ref 19–32)
Chloride: 107 mEq/L (ref 96–112)
Creatinine, Ser: 0.7 mg/dL (ref 0.4–1.2)

## 2012-06-14 ENCOUNTER — Telehealth: Payer: Self-pay | Admitting: Cardiovascular Disease

## 2012-06-14 ENCOUNTER — Ambulatory Visit (INDEPENDENT_AMBULATORY_CARE_PROVIDER_SITE_OTHER): Payer: Managed Care, Other (non HMO) | Admitting: Obstetrics and Gynecology

## 2012-06-14 DIAGNOSIS — N62 Hypertrophy of breast: Secondary | ICD-10-CM

## 2012-06-14 MED ORDER — ESTRADIOL 0.025 MG/24HR TD PTWK: 1.0000 | MEDICATED_PATCH | TRANSDERMAL | Status: DC

## 2012-06-14 NOTE — Patient Instructions (Signed)
Schedule mammogram for next month

## 2012-06-14 NOTE — Progress Notes (Signed)
The patient came to see me today with a six-month history of both breasts gradually getting larger. She is now a cup size bigger. She has no masses. She has no real mastodynia. She is due for her yearly mammogram in September. She has gained about 5 pounds. She is on 0.0375 mg Climara patch. She does not have any nipple discharge.  Exam: [name] present. Both breasts were carefully examined in the sitting and lying position. She does have large breasts. There are no skin changes. There were absolutely no dominant lesions. There is no focal tenderness.  Assessment: Breast hypertrophy associated with some weight gain.  Plan: Patient to try to lose weight. Reduced her patch to 0.025 mg. Reassured her that the fact  it was bilateral was against this beeing something suspicious. She will get her mammogram next month. Had a preliminary discussion about breast augmentation.

## 2012-06-14 NOTE — Telephone Encounter (Signed)
Told pt to hold lipitor till it can be discussed with Dr [name] next week to see if it helps. Pt knows she wont hear back till 8/13 or 8/14.

## 2012-06-14 NOTE — Telephone Encounter (Signed)
New Problem:    Patient called in because when taking her atorvastatin (LIPITOR) 20 MG tablet she is experiencing memory loss, feels foggy and forgetful, is fatigued, has heart palpitations and muscle discomfort mostly at night.  Please call back.

## 2012-06-19 NOTE — Telephone Encounter (Signed)
OK to hold lipitor for now.  We will discuss at next office visit.  See if her symptoms improve off lipitor.  If they improve, then we will assume that they are related.  She is at low risk - no hx of cad or stroke.  If the statins are causing her side effects, we may need to treat with diet and exercise.  She may need to see me to discuss in a month or so.

## 2012-06-19 NOTE — Telephone Encounter (Signed)
Sent to Dr [name] to advise

## 2012-06-20 NOTE — Telephone Encounter (Signed)
Pt dc Lipitor due to muscle ache, memory feeling foggy, pt feeling better being off med.  discussed diet and exercise, pt will come in 3 months for labs and made f/u ov with dr [name], pt to call with questions or concerns, pt agreed to plan.

## 2012-06-27 ENCOUNTER — Ambulatory Visit: Payer: Managed Care, Other (non HMO) | Admitting: Obstetrics and Gynecology

## 2012-06-28 ENCOUNTER — Telehealth: Payer: Self-pay | Admitting: Internal Medicine

## 2012-06-28 ENCOUNTER — Ambulatory Visit: Payer: Managed Care, Other (non HMO) | Admitting: Obstetrics and Gynecology

## 2012-06-28 NOTE — Telephone Encounter (Signed)
I returned a call to the patient.  Patient reports a dry cough, lump in her throat and indigestion.  She took a zegerid this am and her symptoms are much worse.  She is able to swallow liquids but does not feel she can eat solids.  She is scheduled to see Willette Cluster RNP tomorrow at 1:30.  She is advised to stay on a liquid diet until her appt tomorrow.

## 2012-06-29 ENCOUNTER — Ambulatory Visit (INDEPENDENT_AMBULATORY_CARE_PROVIDER_SITE_OTHER): Payer: PRIVATE HEALTH INSURANCE | Admitting: Nurse Practitioner

## 2012-06-29 ENCOUNTER — Encounter: Payer: Self-pay | Admitting: Nurse Practitioner

## 2012-06-29 VITALS — BP 114/70 | HR 104 | Ht 66.0 in | Wt 154.2 lb

## 2012-06-29 DIAGNOSIS — R1310 Dysphagia, unspecified: Secondary | ICD-10-CM

## 2012-06-29 MED ORDER — SUCRALFATE 1 GM/10ML PO SUSP: 1.0000 g | Freq: Four times a day (QID) | ORAL | Status: DC

## 2012-06-29 MED ORDER — OMEPRAZOLE-SODIUM BICARBONATE 20-1100 MG PO CAPS: 1.0000 | ORAL_CAPSULE | Freq: Two times a day (BID) | ORAL | Status: DC

## 2012-06-29 NOTE — Progress Notes (Signed)
Lauren Montes [id number] 03-Sep-1956   HISTORY OR PRESENT ILLNESS :  Patient is a 56 year old female known to Dr. Russella Dar for history of chronic constipation, GERD. She comes for evaluation of swallowing problems. Monday night she woke up in the middle of the night with sharp mid chest pain, since then food has been getting stuck when she eats. She feels food is stacking up in her esophagus.  No significant pain with swallowing just the mid chest pain at the onset of symptoms. Patient does use steroid inhalers, she rinses her mouth after use.  Patient has taken daily Zegeri  since November and she avoids spicy foods. Omitting a dose of PPI isn't possible without symptoms. 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   Current Medications, Allergies, Past Medical History, Past Surgical History, Family History and Social History were reviewed in [employer] record.   PHYSICAL EXAMINATION : General:  Well developed  female in no acute distress Head: Normocephalic and atraumatic Eyes:  sclerae anicteric,conjunctive pink. Ears: Normal auditory acuity Neck: Supple, no masses.  Mouth: No obvious lesions or candida Lungs: Clear throughout to auscultation Heart: Regular rate and rhythm Abdomen: Soft, nondistended, nontender. No masses or hepatomegaly noted. Normal bowel sounds Musculoskeletal: Symmetrical with no gross deformities  Skin: No lesions on visible extremities Extremities: No edema or deformities noted Neurological: Oriented x 4, grossly nonfocal Cervical Nodes:  No significant cervical adenopathy Psychological:  Alert and cooperative. Normal mood and affect  ASSESSMENT AND PLAN :  Acute dysphasia to solids tablet and liquids after episode of mid chest pain. Rule out esophageal ulcers / esophagitis secondadry to Doxycycline. Rule out candida esophagitis.  Will treat patient with PPI and Carafate but I think it is important to know  if patient has doxycycline- induced esophagitis, especially since patient explains there are a limited number of medications to treat ocular rosacea. We placed a call to Dr. Charlotte Sanes at [facility] to inquire if patient can at least hold doxycycline until time of her upper endoscopy.

## 2012-06-29 NOTE — Patient Instructions (Addendum)
You have been scheduled for an endoscopy with propofol. Please follow written instructions given to you at your visit today. If you use inhalers (even only as needed), please bring them with you on the day of your procedure.  We have sent the following medications to your pharmacy for you to pick up at your convenience: Carafate, please take as directed.  Start taking Zegrid twice daily once in the morning before breakfast and one before bedtime.

## 2012-07-02 ENCOUNTER — Encounter: Payer: PRIVATE HEALTH INSURANCE | Admitting: Internal Medicine

## 2012-07-02 ENCOUNTER — Encounter: Payer: Self-pay | Admitting: Nurse Practitioner

## 2012-07-03 ENCOUNTER — Encounter: Payer: Self-pay | Admitting: Gastroenterology

## 2012-07-03 ENCOUNTER — Ambulatory Visit (AMBULATORY_SURGERY_CENTER): Payer: PRIVATE HEALTH INSURANCE | Admitting: Gastroenterology

## 2012-07-03 VITALS — BP 135/78 | HR 126 | Temp 98.1°F | Resp 14 | Ht 66.0 in | Wt 154.0 lb

## 2012-07-03 DIAGNOSIS — R1310 Dysphagia, unspecified: Secondary | ICD-10-CM

## 2012-07-03 DIAGNOSIS — K2210 Ulcer of esophagus without bleeding: Secondary | ICD-10-CM

## 2012-07-03 DIAGNOSIS — D131 Benign neoplasm of stomach: Secondary | ICD-10-CM

## 2012-07-03 MED ORDER — SODIUM CHLORIDE 0.9 % IV SOLN: 500.0000 mL | INTRAVENOUS | Status: DC

## 2012-07-03 NOTE — Op Note (Signed)
[facility] [address] [place], [address]   ENDOSCOPY PROCEDURE REPORT PATIENT: Lauren Montes, [name]  MR#: [id number] BIRTHDATE: 09/03/1956 , 56  yrs. old GENDER: Female ENDOSCOPIST: Meryl Dare, MD, [facility]   PROCEDURE DATE:  07/03/2012 PROCEDURE:   EGD with biopsy ASA CLASS:   Class II INDICATIONS:dysphagia and odynophagia. MEDICATIONS: MAC sedation, administered by CRNA and propofol (Diprivan) 250mg  IV TOPICAL ANESTHETIC:   Cetacaine Spray DESCRIPTION OF PROCEDURE:   After the risks benefits and alternatives of the procedure were thoroughly explained, informed consent was obtained.  The [id number]  endoscope was introduced through the mouth  and advanced to the descending duodenum ,      The instrument was slowly withdrawn as the mucosa was carefully examined.   ESOPHAGUS: Two superficial, friable ulcers ranging between 3-5 mm in size were found in the upper third of the esophagus at 24 cm from the incisors. Typical for pill ulcers. Biopsies were taken at edge of the ulcers.  The esophagus was otherwise normal. STOMACH: Multiple polyps ranging between 3-66mm in size were found in the gastric fundus. 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 Multiple biopsies was performed. The stomach otherwise appeared normal. DUODENUM: The duodenal mucosa showed no abnormalities in the bulb and second portion of the duodenum.  Retroflexed views were normal.   COMPLICATIONS: There were no complications. ENDOSCOPIC IMPRESSION: 1.   Two small ulcers in the upper esophagus 2.   Multiple polyps in the gastric fundus  RECOMMENDATIONS: 1.  await pathology results 2.  continue PPI 3.  anti-reflux regimen long term and soft diet until swallowing symptoms resolve 4.  Hold TCN until swallowing symptoms completely resolve then take with a glass of water at least 2 hrs before recumbency   eSigned:  Meryl Dare, MD, [facility] 07/03/2012 11:37  AM   XB:[id number] Tisovec, MD

## 2012-07-03 NOTE — Progress Notes (Signed)
Like pill associated, I agree with plan outlined in this note

## 2012-07-03 NOTE — Progress Notes (Signed)
Patient did not experience any of the following events: a burn prior to discharge; a fall within the facility; wrong site/side/patient/procedure/implant event; or a hospital transfer or hospital admission upon discharge from the facility. (G8907) Patient did not have preoperative order for IV antibiotic SSI prophylaxis. (G8918)  

## 2012-07-03 NOTE — Patient Instructions (Signed)
STOP VIBRAMYCIN UNTIL SWALLOWING SYMPTOMS HAVE RESOLVED. THEN ONCE VIBRAMYCIN  RESTARTED, TAKE THE VIBRAMYCIN WITH A FULL GLASS OF WATER AT LEAST 2 HOURS BEFORE LYING DOWN. CONTINUE THE ZEGRID UNTIL COMPLETED AND THEN RESUME THE OTC PRILOSEC.      YOU HAD AN ENDOSCOPIC PROCEDURE TODAY AT THE [facility]: Refer to the procedure report that was given to you for any specific questions about what was found during the examination.  If the procedure report does not answer your questions, please call your gastroenterologist to clarify.  If you requested that your care partner not be given the details of your procedure findings, then the procedure report has been included in a sealed envelope for you to review at your convenience later.  YOU SHOULD EXPECT: Some feelings of bloating in the abdomen. Passage of more gas than usual.  Walking can help get rid of the air that was put into your GI tract during the procedure and reduce the bloating. If you had a lower endoscopy (such as a colonoscopy or flexible sigmoidoscopy) you may notice spotting of blood in your stool or on the toilet paper. If you underwent a bowel prep for your procedure, then you may not have a normal bowel movement for a few days.  DIET: Your first meal following the procedure should be a light meal and then it is ok to progress to your normal diet.   SOFT DIET UNTIL SYMPTOMS RESOLVE. ACTIVITY: Your care partner should take you home directly after the procedure.  You should plan to take it easy, moving slowly for the rest of the day.  You can resume normal activity the day after the procedure however you should NOT DRIVE or use heavy machinery for 24 hours (because of the sedation medicines used during the test).    SYMPTOMS TO REPORT IMMEDIATELY: A gastroenterologist can be reached at any hour.  During normal business hours, 8:30 AM to 5:00 PM Monday through Friday, call [phone].  After hours and on weekends, please  call the GI answering service at [phone] who will take a message and have the physician on call contact you.   Following upper endoscopy (EGD)  Vomiting of blood or coffee ground material  New chest pain or pain under the shoulder blades  Painful or persistently difficult swallowing  New shortness of breath  Fever of 100F or higher  Black, tarry-looking stools  FOLLOW UP: If any biopsies were taken you will be contacted by phone or by letter within the next 1-3 weeks.  Call your gastroenterologist if you have not heard about the biopsies in 3 weeks.  Our staff will call the home number listed on your records the next business day following your procedure to check on you and address any questions or concerns that you may have at that time regarding the information given to you following your procedure. This is a courtesy call and so if there is no answer at the home number and we have not heard from you through the emergency physician on call, we will assume that you have returned to your regular daily activities without incident.  SIGNATURES/CONFIDENTIALITY: You and/or your care partner have signed paperwork which will be entered into your electronic medical record.  These signatures attest to the fact that that the information above on your After Visit Summary has been reviewed and is understood.  Full responsibility of the confidentiality of this discharge information lies with you and/or your care-partner.

## 2012-07-04 ENCOUNTER — Other Ambulatory Visit: Payer: Self-pay | Admitting: Obstetrics and Gynecology

## 2012-07-04 ENCOUNTER — Telehealth: Payer: Self-pay | Admitting: *Deleted

## 2012-07-04 DIAGNOSIS — Z1231 Encounter for screening mammogram for malignant neoplasm of breast: Secondary | ICD-10-CM

## 2012-07-04 NOTE — Telephone Encounter (Signed)
She can increase her Zegerid to bid for 7 days. Follow all other instructions from yesterday. Call if she does not improve.

## 2012-07-04 NOTE — Telephone Encounter (Signed)
CALLED THE PATIENT AND INFORMED OF DR. STARK'S ORDER TO INCREASE THE ZEGERID FOR 7 DAYS UNTIL SEPTEMBER 4,2013 THEN ONCE DAILY. PATIENT AGREED TO INSTRUCTION AND WILL CALL BACK IF SHE DOES NOT IMPROVE.

## 2012-07-04 NOTE — Telephone Encounter (Signed)
  Follow up Call-  Call back number 07/03/2012  Post procedure Call Back phone  # [phone]  Permission to leave phone message Yes     Patient questions:  Do you have a fever, pain , or abdominal swelling? yes Pain Score  0 *  Have you tolerated food without any problems? yes  Have you been able to return to your normal activities? yes  Do you have any questions about your discharge instructions: Diet   no Medications  no Follow up visit  no  Do you have questions or concerns about your Care? no  Actions: * If pain score is 4 or above: No action needed, pain <4. PATIENT STATING SHE HAD THE SAME PAIN AT MIDNIGHT LAST EVENING, TOOK CARAFATE AND WAS ABLE TO RETURN TO SLEEP AT 0400. PATIENT STATING SHE DID NOT EXPERIENCE ANY SHORTNESS OF BREATH OR NAUSEA WITH THE CHEST DISCOMFORT. PATIENT STATING SAME PAIN AS PREPROCEDURE. PATIENT STATING SHE HAS NOT HAD ANY DOXYCLYCLINE. AND HAD SOUP AT 1800 LAST EVENING. PATIENT STATING SHE IS PAIN FREE AT PRESENT. INFORMED THE PATIENT TO CALL IF PAIN CONTINUES.

## 2012-07-10 ENCOUNTER — Encounter: Payer: Self-pay | Admitting: Gastroenterology

## 2012-07-27 ENCOUNTER — Encounter: Payer: Self-pay | Admitting: Women's Health

## 2012-07-27 ENCOUNTER — Ambulatory Visit (INDEPENDENT_AMBULATORY_CARE_PROVIDER_SITE_OTHER): Payer: PRIVATE HEALTH INSURANCE | Admitting: Women's Health

## 2012-07-27 DIAGNOSIS — R350 Frequency of micturition: Secondary | ICD-10-CM

## 2012-07-27 DIAGNOSIS — R3915 Urgency of urination: Secondary | ICD-10-CM

## 2012-07-27 LAB — URINALYSIS W MICROSCOPIC + REFLEX CULTURE
Bilirubin Urine: NEGATIVE
Casts: NONE SEEN
Crystals: NONE SEEN
Ketones, ur: NEGATIVE mg/dL
Nitrite: NEGATIVE
Specific Gravity, Urine: 1.015 (ref 1.005–1.030)
Urobilinogen, UA: 0.2 mg/dL (ref 0.0–1.0)
pH: 7.5 (ref 5.0–8.0)

## 2012-07-27 NOTE — Progress Notes (Signed)
Patient ID: Lauren Montes, female   DOB: 07/29/1956, 56 y.o.   MRN: [id number] Presents with the complaint of bladder pressure,  Urgency, frequency with small amount of burning with urination for 2 days. Denies vaginal discharge with itching, odor or irritation. Denies a fever. History of a TAH with BSO for endometriosis.  Exam: No CVAT, external genitalia within normal limits, speculum exam no discharge or erythema noted. Bimanual no tenderness or fullness noted. UA: Small leukocytes, 3-6 WBCs, rare bacteria.  Possible UTI  Plan: Urine culture pending. Will try your Uribel 4 times daily while awaiting culture results.

## 2012-07-30 ENCOUNTER — Other Ambulatory Visit: Payer: Self-pay | Admitting: Women's Health

## 2012-07-30 ENCOUNTER — Telehealth: Payer: Self-pay | Admitting: *Deleted

## 2012-07-30 DIAGNOSIS — B373 Candidiasis of vulva and vagina: Secondary | ICD-10-CM

## 2012-07-30 DIAGNOSIS — B3731 Acute candidiasis of vulva and vagina: Secondary | ICD-10-CM

## 2012-07-30 MED ORDER — FLUCONAZOLE 150 MG PO TABS: 150.0000 mg | ORAL_TABLET | Freq: Once | ORAL | Status: DC

## 2012-07-30 MED ORDER — NITROFURANTOIN MONOHYD MACRO 100 MG PO CAPS: 100.0000 mg | ORAL_CAPSULE | Freq: Two times a day (BID) | ORAL | Status: DC

## 2012-07-30 NOTE — Telephone Encounter (Signed)
Pt was seen on 07/27/12 c/o complaint of bladder pressure, giving uribel , cul true still pending. Pt bladder still feel full, more vaginal burning as well. Pt c/o vaginal itching no discharge. Please advise

## 2012-07-30 NOTE — Telephone Encounter (Signed)
Note sent earlier to pool this a.m. to treat UTI, positive urine culture.

## 2012-07-30 NOTE — Telephone Encounter (Signed)
rx sent, pt asked about what could she use for vaginal itching?

## 2012-07-30 NOTE — Telephone Encounter (Signed)
Telephone call to patient, states vaginal itching mostly internal no noted discharge. Will take Diflucan 151 dose along with UTI medication Macrobid. Instructed to call if no relief of yeast and UTIs symptoms.

## 2012-08-01 ENCOUNTER — Ambulatory Visit
Admission: RE | Admit: 2012-08-01 | Discharge: 2012-08-01 | Disposition: A | Discharge status: Home / Self Care | Payer: PRIVATE HEALTH INSURANCE | Source: Ambulatory Visit | Attending: Obstetrics and Gynecology | Admitting: Obstetrics and Gynecology

## 2012-08-01 DIAGNOSIS — Z1231 Encounter for screening mammogram for malignant neoplasm of breast: Secondary | ICD-10-CM

## 2012-08-07 ENCOUNTER — Ambulatory Visit: Payer: Managed Care, Other (non HMO) | Admitting: Internal Medicine

## 2012-09-07 ENCOUNTER — Other Ambulatory Visit: Payer: Self-pay | Admitting: *Deleted

## 2012-09-07 DIAGNOSIS — E785 Hyperlipidemia, unspecified: Secondary | ICD-10-CM

## 2012-09-07 DIAGNOSIS — K219 Gastro-esophageal reflux disease without esophagitis: Secondary | ICD-10-CM

## 2012-09-07 DIAGNOSIS — E7800 Pure hypercholesterolemia, unspecified: Secondary | ICD-10-CM

## 2012-09-12 ENCOUNTER — Other Ambulatory Visit (INDEPENDENT_AMBULATORY_CARE_PROVIDER_SITE_OTHER): Payer: PRIVATE HEALTH INSURANCE

## 2012-09-12 DIAGNOSIS — E785 Hyperlipidemia, unspecified: Secondary | ICD-10-CM

## 2012-09-12 DIAGNOSIS — K219 Gastro-esophageal reflux disease without esophagitis: Secondary | ICD-10-CM

## 2012-09-12 DIAGNOSIS — E7800 Pure hypercholesterolemia, unspecified: Secondary | ICD-10-CM

## 2012-09-12 LAB — BASIC METABOLIC PANEL
CO2: 28 mEq/L (ref 19–32)
Calcium: 9.4 mg/dL (ref 8.4–10.5)
Chloride: 107 mEq/L (ref 96–112)
Glucose, Bld: 89 mg/dL (ref 70–99)
Potassium: 4.3 mEq/L (ref 3.5–5.1)
Sodium: 142 mEq/L (ref 135–145)

## 2012-09-12 LAB — LIPID PANEL
HDL: 46.3 mg/dL (ref >39.00)
Total CHOL/HDL Ratio: 5
VLDL: 12 mg/dL (ref 0.0–40.0)

## 2012-09-12 LAB — HEPATIC FUNCTION PANEL
Bilirubin, Direct: 0.1 mg/dL (ref 0.0–0.3)
Total Bilirubin: 0.8 mg/dL (ref 0.3–1.2)

## 2012-09-13 ENCOUNTER — Other Ambulatory Visit: Payer: Managed Care, Other (non HMO)

## 2012-09-14 ENCOUNTER — Ambulatory Visit (INDEPENDENT_AMBULATORY_CARE_PROVIDER_SITE_OTHER): Payer: PRIVATE HEALTH INSURANCE | Admitting: Cardiovascular Disease

## 2012-09-14 ENCOUNTER — Encounter: Payer: Self-pay | Admitting: Cardiovascular Disease

## 2012-09-14 VITALS — BP 116/78 | HR 96 | Resp 14 | Ht 66.0 in | Wt 147.0 lb

## 2012-09-14 DIAGNOSIS — E7800 Pure hypercholesterolemia, unspecified: Secondary | ICD-10-CM

## 2012-09-14 DIAGNOSIS — E785 Hyperlipidemia, unspecified: Secondary | ICD-10-CM

## 2012-09-14 DIAGNOSIS — I498 Other specified cardiac arrhythmias: Secondary | ICD-10-CM

## 2012-09-14 DIAGNOSIS — R000 Tachycardia, unspecified: Secondary | ICD-10-CM | POA: Insufficient documentation

## 2012-09-14 MED ORDER — DILTIAZEM HCL ER 120 MG PO CP24: 120.0000 mg | ORAL_CAPSULE | Freq: Every day | ORAL | Status: DC

## 2012-09-14 NOTE — Patient Instructions (Signed)
Your physician recommends that you return for a FASTING lipid profile: Monday 09/17/12, 7:30 - 4 pm  Your physician recommends that you schedule a follow-up appointment in: 3 months   Your physician has recommended you make the following change in your medication:   Start dilacor 120 mg daily for rapid heart rate.

## 2012-09-14 NOTE — Assessment & Plan Note (Signed)
She had her cholesterol levels drawn earlier this week. Her LDL is 170. We'll have her return on Monday for an LDL particle number.  She has tolerated Crestor the past but did not tolerate atorvastatin. We may need to write a letter to the insurance company requesting that she be allowed to take atorvastatin since it fairly strong statin that she can tolerate.

## 2012-09-14 NOTE — Assessment & Plan Note (Signed)
[name] had persistent sinus tachycardia. She has tolerated diltiazem in the past. Initially thought that she may be having some side effects of her asthma medications but she continues to have problems with tachycardia despite changing her asthma medicines.  I do not think that she'll tolerate a low-dose beta blocker. We'll start her on Dilacor ( generic Diltiazem) 120 mg a day.\  See her in 3 months for followup visit and EKG.

## 2012-09-14 NOTE — Progress Notes (Signed)
Lauren Montes Date of Birth  1956/01/20 [facility] / [facility] [address]     [address] [place], [place]  [address] [phone]  Fax  [phone]  1. Hyperlipidemia 2. Sinus tachycardia 3. Chronic asthma  History of Present Illness:  Lauren Montes is a middle-age female the history of mild diastolic dysfunction as well as leg edema. She also has a history of hypercholesterolemia.   Has a history of hyperlipidemia. We have had her on Crestor the past which she tolerated very well. Her insurance and he refused to pay for to try her on atorvastatin. This caused her to have lots of muscle aches as well as memory loss. We discontinued the atorvastatin at that time.  She has been recording her heart rate and blood pressure readings. Her heart rate is frequently above 100. He also has many readings in the 80-90 range.  Has bruising particularly on her hands and on her fingers. [name] never had any easy bleeding. She did not have any bleeding  complications. during her pregnancies or deliveries.   Current Outpatient Prescriptions  Medication Sig Dispense Refill  . beclomethasone (QVAR) 80 MCG/ACT inhaler Inhale 2 puffs into the lungs 2 (two) times daily.  1 Inhaler  5  . calcium-vitamin D (OSCAL WITH D) 500-200 MG-UNIT per tablet Take 1 tablet by mouth daily.       . cyclobenzaprine (FLEXERIL) 10 MG tablet Take 10 mg by mouth at bedtime.        . cycloSPORINE (RESTASIS) 0.05 % ophthalmic emulsion Place 1 drop into both eyes 2 (two) times daily.      Marland Kitchen estradiol (CLIMARA - DOSED IN MG/24 HR) 0.0375 mg/24hr Place 1 patch (0.0375 mg total) onto the skin once a week.  13 patch  04  . fexofenadine (ALLEGRA) 180 MG tablet Take 180 mg by mouth daily.      . fluconazole (DIFLUCAN) 150 MG tablet Take 1 tablet (150 mg total) by mouth once.  1 tablet  1  . Omeprazole-Sodium Bicarbonate (ZEGERID) 20-1100 MG CAPS Take 1 capsule by mouth 2 (two) times daily.  14 each  0  .  Oxymetazoline HCl (QC NASAL RELIEF SINUS NA) Place into the nose.      . QNASL 80 MCG/ACT AERS Place 80 % into the nose.      . levalbuterol (XOPENEX HFA) 45 MCG/ACT inhaler Inhale 1-2 puffs into the lungs every 4 (four) hours as needed.        . montelukast (SINGULAIR) 10 MG tablet Take 10 mg by mouth at bedtime.        . Multiple Vitamin (MULTIVITAMIN) tablet Take 1 tablet by mouth daily.        . nabumetone (RELAFEN) 750 MG tablet Take 500 mg by mouth 2 (two) times daily as needed.       . Omega-3 Fatty Acids (OMEGA 3 PO) Take 1 capsule by mouth daily.        . [DISCONTINUED] estradiol (CLIMARA - DOSED IN MG/24 HR) 0.025 mg/24hr Place 1 patch (0.025 mg total) onto the skin once a week.  4 patch  10     Allergies  Allergen Reactions  . Lipitor (Atorvastatin) Other (See Comments)    Muscle ache, mind foggy,   . Codeine     Past Medical History  Diagnosis Date  . History of diastolic dysfunction   . Asthma   . History of migraine headaches   . Leg edema  left leg  . Tachycardia   . Endometriosis   . Cystic teratoma   . Elevated cholesterol   . Ocular rosacea   . GERD (gastroesophageal reflux disease)     Past Surgical History  Procedure Date  . Foot surgery     LEFT FOOT  . Cardiovascular stress test 07/02/2008    EF 55-60%  . Appendectomy   . Bunionectomy     bilateral  . Left breast mass removed   . Rso 2002    partial  . Dental surgery 2007  . Ruptured disc surgery 07,08  . Back surgery   . Oophorectomy 1982    RSO partial and LSO, uterus  . Total abdominal hysterectomy     History  Smoking status  . Never Smoker   Smokeless tobacco  . Never Used    History  Alcohol Use  . 2.4 oz/week  . 4 Glasses of wine per week    Family History  Problem Relation Age of Onset  . Coronary artery disease Father   . Hypertension Father   . Heart disease Father   . Asthma Mother   . Hypertension Mother   . Alzheimer's disease Maternal Grandmother   . Lung  cancer Maternal Grandfather   . Heart failure Paternal Grandfather   . Colon cancer Neg Hx   . Colon polyps Neg Hx   . Rectal cancer Neg Hx   . Stomach cancer Neg Hx     Reviw of Systems:  Reviewed in the HPI.  All other systems are negative.  Physical Exam: BP 116/78  Pulse 96  Resp 14  Ht 5\' 6"  (1.676 m)  Wt 147 lb (66.679 kg)  BMI 23.73 kg/m2  LMP 11/07/1980 The patient is alert and oriented x 3.  The mood and affect are normal.   Skin: warm and dry.  Color is normal.    HEENT:   the sclera are nonicteric.  The mucous membranes are moist.  The carotids are 2+ without bruits.  There is no thyromegaly.  There is no JVD.    Lungs: clear.  The chest wall is non tender.    Heart: regular rate with a normal S1 and S2.  There are no murmurs, gallops, or rubs. The PMI is not displaced.     Abdomin: good bowel sounds.  There is no guarding or rebound.  There is no hepatosplenomegaly or tenderness.  There are no masses.   Extremities:  Trace edema.  The legs are without rashes.  The distal pulses are intact.   Neuro:  Cranial nerves II - XII are intact.  Motor and sensory functions are intact.    The gait is normal.  ECG: Normal sinus rhythm 83 beats a minute. She has no ST or T wave changes. She has right axis deviation. Assessment / Plan:

## 2012-09-14 NOTE — Assessment & Plan Note (Signed)
Her cholesterol levels are elevated. She has tolerated Crestor the past

## 2012-09-17 ENCOUNTER — Other Ambulatory Visit (INDEPENDENT_AMBULATORY_CARE_PROVIDER_SITE_OTHER): Payer: PRIVATE HEALTH INSURANCE

## 2012-09-17 ENCOUNTER — Telehealth: Payer: Self-pay | Admitting: Cardiovascular Disease

## 2012-09-17 DIAGNOSIS — R000 Tachycardia, unspecified: Secondary | ICD-10-CM

## 2012-09-17 DIAGNOSIS — E785 Hyperlipidemia, unspecified: Secondary | ICD-10-CM

## 2012-09-17 DIAGNOSIS — R0989 Other specified symptoms and signs involving the circulatory and respiratory systems: Secondary | ICD-10-CM

## 2012-09-17 NOTE — Telephone Encounter (Signed)
New Problem:    Patient called in because she is having severe headaches and problems with he asthma when taking her diltiazem (DILACOR XR) 120 MG 24 hr capsule.  Patient did not take medication today and feels 100% better.  Looked up drug and saw that it reacts negatively with her other medications.  Please call back.

## 2012-09-17 NOTE — Telephone Encounter (Signed)
Pt will try med at half dose and will try to take it alone at different time than other meds, pt will call with further questions or concerns,

## 2012-09-21 ENCOUNTER — Encounter: Payer: Self-pay | Admitting: Cardiovascular Disease

## 2012-11-30 ENCOUNTER — Telehealth: Payer: Self-pay | Admitting: Cardiovascular Disease

## 2012-11-30 NOTE — Telephone Encounter (Signed)
Pt states that she has bronchitis and was given a Prednisone injection by her PCP yesterday. She c/o SOB and elevated HR of 91. Pt is advised to contact her PCP if sysptoms worsen or to call 911,she verbalized understanding.

## 2012-11-30 NOTE — Telephone Encounter (Signed)
Pt having SOB and fast heart rate times 3 days , requested to see nahser today, told he he is out until next week, pls advise

## 2012-12-11 ENCOUNTER — Ambulatory Visit (INDEPENDENT_AMBULATORY_CARE_PROVIDER_SITE_OTHER): Payer: PRIVATE HEALTH INSURANCE | Admitting: Cardiovascular Disease

## 2012-12-11 ENCOUNTER — Encounter: Payer: Self-pay | Admitting: Cardiovascular Disease

## 2012-12-11 VITALS — BP 118/83 | HR 80 | Ht 66.0 in | Wt 153.4 lb

## 2012-12-11 DIAGNOSIS — I498 Other specified cardiac arrhythmias: Secondary | ICD-10-CM

## 2012-12-11 DIAGNOSIS — E785 Hyperlipidemia, unspecified: Secondary | ICD-10-CM

## 2012-12-11 DIAGNOSIS — R000 Tachycardia, unspecified: Secondary | ICD-10-CM

## 2012-12-11 MED ORDER — PROPRANOLOL HCL 10 MG PO TABS: 10.0000 mg | ORAL_TABLET | Freq: Four times a day (QID) | ORAL | Status: DC | PRN

## 2012-12-11 MED ORDER — ROSUVASTATIN CALCIUM 10 MG PO TABS: 10.0000 mg | ORAL_TABLET | Freq: Every day | ORAL | Status: DC

## 2012-12-11 NOTE — Assessment & Plan Note (Addendum)
Lauren Montes is doing fairly well.  [name] a low dose of diltiazem slow-release but seems to be tolerating it very well. She still has occasional palpitations that occur in the middle of the night.   These sound like premature ventricular contractions. We'll refill her prescription for propranolol 10 mg to take 4 times a day as needed.  I'll see her back in the office in 6 months for followup visit.

## 2012-12-11 NOTE — Progress Notes (Signed)
[name] Date of Birth  04-Jun-1956 [facility] / [facility] [address]     [address] [place], [place]  [address] [phone]  Fax  [phone]  1. Hyperlipidemia 2. Sinus tachycardia 3. Chronic asthma  History of Present Illness:  Lauren Montes is a middle-age female the history of mild diastolic dysfunction as well as leg edema. She also has a history of hypercholesterolemia.   Has a history of hyperlipidemia. We have had her on Crestor the past which she tolerated very well. Her insurance and he refused to pay for to try her on atorvastatin. This caused her to have lots of muscle aches as well as memory loss. We discontinued the atorvastatin at that time.  She has been recording her heart rate and blood pressure readings. Her heart rate is frequently above 100. He also has many readings in the 80-90 range.  Has bruising particularly on her hands and on her fingers. [name] never had any easy bleeding. She did not have any bleeding  complications. during her pregnancies or deliveries.  Feb. 4, 2014: [name] is doing well.  Her HR and BP have been well controlled.    Current Outpatient Prescriptions  Medication Sig Dispense Refill  . calcium-vitamin D (OSCAL WITH D) 500-200 MG-UNIT per tablet Take 1 tablet by mouth daily.       . cyclobenzaprine (FLEXERIL) 10 MG tablet Take 10 mg by mouth as needed.       . cycloSPORINE (RESTASIS) 0.05 % ophthalmic emulsion Place 1 drop into both eyes 2 (two) times daily.      Marland Kitchen diltiazem (DILACOR XR) 120 MG 24 hr capsule Take 1 capsule (120 mg total) by mouth daily.  30 capsule  6  . estradiol (CLIMARA - DOSED IN MG/24 HR) 0.0375 mg/24hr Place 1 patch (0.0375 mg total) onto the skin once a week.  13 patch  04  . fexofenadine (ALLEGRA) 180 MG tablet Take 180 mg by mouth daily.      . fluticasone-salmeterol (ADVAIR HFA) 115-21 MCG/ACT inhaler Inhale 2 puffs into the lungs 2 (two) times daily.      Marland Kitchen levalbuterol  (XOPENEX HFA) 45 MCG/ACT inhaler Inhale 1-2 puffs into the lungs every 4 (four) hours as needed.        . montelukast (SINGULAIR) 10 MG tablet Take 10 mg by mouth at bedtime.        . nabumetone (RELAFEN) 750 MG tablet Take 500 mg by mouth daily.       . Omega-3 Fatty Acids (OMEGA 3 PO) Take 1 capsule by mouth daily.        Maxwell Caul Bicarbonate (ZEGERID) 20-1100 MG CAPS Take 1 capsule by mouth 2 (two) times daily.  14 each  0  . QNASL 80 MCG/ACT AERS Place 80 % into the nose.         Allergies  Allergen Reactions  . Lipitor (Atorvastatin) Other (See Comments)    Muscle ache, mind foggy,   . Codeine     Past Medical History  Diagnosis Date  . History of diastolic dysfunction   . Asthma   . History of migraine headaches   . Leg edema     left leg  . Tachycardia   . Endometriosis   . Cystic teratoma   . Elevated cholesterol   . Ocular rosacea   . GERD (gastroesophageal reflux disease)     Past Surgical History  Procedure Date  . Foot surgery  LEFT FOOT  . Cardiovascular stress test 07/02/2008    EF 55-60%  . Appendectomy   . Bunionectomy     bilateral  . Left breast mass removed   . Rso 2002    partial  . Dental surgery 2007  . Ruptured disc surgery 07,08  . Back surgery   . Oophorectomy 1982    RSO partial and LSO, uterus  . Total abdominal hysterectomy     History  Smoking status  . Never Smoker   Smokeless tobacco  . Never Used    History  Alcohol Use  . 2.4 oz/week  . 4 Glasses of wine per week    Family History  Problem Relation Age of Onset  . Coronary artery disease Father   . Hypertension Father   . Heart disease Father   . Asthma Mother   . Hypertension Mother   . Alzheimer's disease Maternal Grandmother   . Lung cancer Maternal Grandfather   . Heart failure Paternal Grandfather   . Colon cancer Neg Hx   . Colon polyps Neg Hx   . Rectal cancer Neg Hx   . Stomach cancer Neg Hx     Reviw of Systems:  Reviewed in the  HPI.  All other systems are negative.  Physical Exam: BP 118/83  Pulse 80  Ht 5\' 6"  (1.676 m)  Wt 153 lb 6.4 oz (69.582 kg)  BMI 24.76 kg/m2  SpO2 98%  LMP 11/07/1980 The patient is alert and oriented x 3.  The mood and affect are normal.   Skin: warm and dry.  Color is normal.    HEENT:   the sclera are nonicteric.  The mucous membranes are moist.  The carotids are 2+ without bruits.  There is no thyromegaly.  There is no JVD.    Lungs: clear.  The chest wall is non tender.    Heart: regular rate with a normal S1 and S2.  There are no murmurs, gallops, or rubs. The PMI is not displaced.     Abdomin: good bowel sounds.  There is no guarding or rebound.  There is no hepatosplenomegaly or tenderness.  There are no masses.   Extremities:  Trace edema.  The legs are without rashes.  The distal pulses are intact.   Neuro:  Cranial nerves II - XII are intact.  Motor and sensory functions are intact.    The gait is normal.  ECG: Normal sinus rhythm 83 beats a minute. She has no ST or T wave changes. She has right axis deviation. Assessment / Plan:

## 2012-12-11 NOTE — Assessment & Plan Note (Addendum)
Lauren Montes had cholesterol levels drawn recently at her medical doctor's office. Her total cholesterol is 285. The triglyceride levels 14. Her HDL is 67. Her LDL was 197.  We had returned several days later for a Lipomed profile.  Unfortunately the company did not receive the order in the blood was   was not run.  When she was on Crestor her LDL was 80. Her HDL is 52. The triglyceride level was 63. The total cholesterol was 149. We tried her on atorvastatin but she developed severe muscle aches as well as mental confusion.  Will restart her on Crestor 10 mg a day. We will try to appeal to her insurance coverage to provide her with Crestor since it  clearly works and she does not tolerate the alternatives.

## 2012-12-11 NOTE — Patient Instructions (Addendum)
Your physician has recommended you make the following change in your medication:   START CRESTOR 10 MG ONCE A DAY START PROPANOLOL 10 MG AS NEEDED FOR PALPITATIONS  Your physician recommends that you return for lab work in: 3 MONTHS  LIPOMED PROFILE, HFP, BMET  Your physician wants you to follow-up in: 6 MONTHS You will receive a reminder letter in the mail two months in advance. If you don't receive a letter, please call our office to schedule the follow-up appointment.

## 2012-12-22 ENCOUNTER — Other Ambulatory Visit: Payer: Self-pay | Admitting: Internal Medicine

## 2013-01-23 ENCOUNTER — Other Ambulatory Visit: Payer: Self-pay | Admitting: Dermatology

## 2013-02-25 ENCOUNTER — Encounter: Payer: Self-pay | Admitting: Obstetrics and Gynecology

## 2013-02-25 ENCOUNTER — Ambulatory Visit (INDEPENDENT_AMBULATORY_CARE_PROVIDER_SITE_OTHER): Payer: PRIVATE HEALTH INSURANCE | Admitting: Obstetrics and Gynecology

## 2013-02-25 VITALS — BP 110/76 | HR 62 | Resp 16 | Ht 66.0 in | Wt 157.0 lb

## 2013-02-25 DIAGNOSIS — Z79890 Hormone replacement therapy (postmenopausal): Secondary | ICD-10-CM

## 2013-02-25 DIAGNOSIS — Z01419 Encounter for gynecological examination (general) (routine) without abnormal findings: Secondary | ICD-10-CM

## 2013-02-25 DIAGNOSIS — E8940 Asymptomatic postprocedural ovarian failure: Secondary | ICD-10-CM

## 2013-02-25 MED ORDER — ESTRADIOL 0.0375 MG/24HR TD PTWK: 1.0000 | MEDICATED_PATCH | TRANSDERMAL | Status: DC

## 2013-02-25 NOTE — Patient Instructions (Signed)

## 2013-02-25 NOTE — Progress Notes (Signed)
57 y.o. G1P1001 MarriedCaucasianF here for annual exam.   Status post total abdominal hysterectomy and left salpingo-oophorectomy for endometriosis in her 30s.  Started HRT at that time for menopausal symptoms. Had right salpingo-oophorectomy in 2002 Changes Climara weekly, but leaves off for a few days due to breast tenderness.   History of tachycardia and hyperlipidemia.  Normal blood glucose.  History of left breast mass removal 20 years ago - benign.  Considering breast reduction.  Has shoulder and back pain.  Bra straps dig into skin in shoulders.   No first or second  degree relatives with breast cancer.   Takes steroids two to three times a year for asthma, back pain.  Has regular consumption of dairy products.  Patient's last menstrual period was 11/07/1980.          Sexually active: Yes yes The current method of family planning is surgicalnone.    Exercising:Yes yes weight bearing twice a week Home exercise routine includes weight bearing exercise. Smoker: No no  Health Maintenance: Pap: April 2013 with PCP [facility]: [facility]   Colonoscopy: 2009 normal with 10 year recheck  BMD:   02/16/11 normal   TDaP: unsure may have had with PCP Labs: In Jan with PCP    reports that she has never smoked. She has never used smokeless tobacco. She reports that she drinks about 2.4 ounces of alcohol per week. She reports that she does not use illicit drugs.  Past Medical History  Diagnosis Date  . History of diastolic dysfunction   . Asthma   . History of migraine headaches   . Leg edema     left leg  . Tachycardia   . Endometriosis   . Cystic teratoma   . Elevated cholesterol   . Ocular rosacea   . GERD (gastroesophageal reflux disease)     Past Surgical History  Procedure Laterality Date  . Foot surgery      LEFT FOOT  . Cardiovascular stress test  07/02/2008    EF 55-60%  . Appendectomy    . Bunionectomy      bilateral  . Left breast mass removed     . Rso  2002    partial  . Dental surgery  2007  . Ruptured disc surgery  07,08  . Back surgery    . Oophorectomy  1982    RSO partial and LSO, uterus  . Total abdominal hysterectomy      Current Outpatient Prescriptions  Medication Sig Dispense Refill  . calcium-vitamin D (OSCAL WITH D) 500-200 MG-UNIT per tablet Take 1 tablet by mouth daily.       . cyclobenzaprine (FLEXERIL) 10 MG tablet Take 10 mg by mouth as needed.       . cycloSPORINE (RESTASIS) 0.05 % ophthalmic emulsion Place 1 drop into both eyes 2 (two) times daily.      Marland Kitchen diltiazem (DILACOR XR) 120 MG 24 hr capsule Take 1 capsule (120 mg total) by mouth daily.  30 capsule  6  . estradiol (CLIMARA - DOSED IN MG/24 HR) 0.0375 mg/24hr Place 1 patch (0.0375 mg total) onto the skin once a week.  13 patch  04  . fexofenadine (ALLEGRA) 180 MG tablet Take 180 mg by mouth daily.      . fluticasone-salmeterol (ADVAIR HFA) 115-21 MCG/ACT inhaler Inhale 2 puffs into the lungs 2 (two) times daily.      Marland Kitchen levalbuterol (XOPENEX HFA) 45 MCG/ACT inhaler Inhale 1-2 puffs into the  lungs every 4 (four) hours as needed.        . montelukast (SINGULAIR) 10 MG tablet Take 10 mg by mouth at bedtime.        . nabumetone (RELAFEN) 750 MG tablet Take 500 mg by mouth daily.       . Omega-3 Fatty Acids (OMEGA 3 PO) Take 1 capsule by mouth daily.        Maxwell Caul Bicarbonate (ZEGERID) 20-1100 MG CAPS Take 1 capsule by mouth 2 (two) times daily.  14 each  0  . propranolol (INDERAL) 10 MG tablet Take 1 tablet (10 mg total) by mouth 4 (four) times daily as needed. FOR PALPITATIONS  30 tablet  6  . QNASL 80 MCG/ACT AERS Place 80 % into the nose.      . rosuvastatin (CRESTOR) 10 MG tablet Take 1 tablet (10 mg total) by mouth daily.  30 tablet  5   No current facility-administered medications for this visit.    Family History  Problem Relation Age of Onset  . Coronary artery disease Father   . Hypertension Father   . Heart disease Father   .  Asthma Mother   . Hypertension Mother   . Alzheimer's disease Maternal Grandmother   . Lung cancer Maternal Grandfather   . Heart failure Paternal Grandfather   . Colon cancer Neg Hx   . Colon polyps Neg Hx   . Rectal cancer Neg Hx   . Stomach cancer Neg Hx   . Skin cancer Sister   . Hepatitis C Brother     ROS:  Pertinent items are noted in HPI.  Otherwise, a comprehensive ROS was negative.  Exam:   BP 110/76  Pulse 62  Resp 16  Ht 5\' 6"  (1.676 m)  Wt 157 lb (71.215 kg)  BMI 25.35 kg/m2  LMP 11/07/1980  Weight change: @WEIGHTCHANGE @ Height:   Height: 5\' 6"  (167.6 cm)  Ht Readings from Last 3 Encounters:  02/25/13 5\' 6"  (1.676 m)  12/11/12 5\' 6"  (1.676 m)  09/14/12 5\' 6"  (1.676 m)    General appearance: alert, cooperative and appears stated age Head: Normocephalic, without obvious abnormality, atraumatic Neck: no adenopathy, supple, symmetrical, trachea midline and thyroid normal to inspection and palpation Lungs: clear to auscultation bilaterally Breasts: normal appearance, no masses or tenderness Heart: regular rate and rhythm Abdomen: Vertical lower abdominal midline incision.  Pfannenstiel incision.  soft, non-tender; bowel sounds normal; no masses,  no organomegaly Extremities: extremities normal, atraumatic, no cyanosis or edema Skin: Skin color, texture, turgor normal. No rashes or lesions Lymph nodes: Cervical, supraclavicular, and axillary nodes normal. No abnormal inguinal nodes palpated Neurologic: Grossly normal   Pelvic: External genitalia:  no lesions              Urethra:  normal appearing urethra with no masses, tenderness or lesions              Bartholins and Skenes: normal                 Vagina: normal appearing vagina with normal color and discharge, no lesions              Cervix: absent              Pap taken: no Bimanual Exam:  Uterus:  uterus absent              Adnexa: no mass, fullness, tenderness  Rectovaginal: Confirms                Anus:  normal sphincter tone, no lesions  A:  Well Woman with normal exam Status post TAH and BSO. Surgical menopause patient. Estrogen therapy patient. Hyperlipidemia. History of tachycardia.  P:   Mammogram yearly.  Patient due in the fall. pap smear not needed. Will continue with Climara.  See Epic orders.   Discussed risks and benefits with patient.  Patient will also discuss with her cardiologist in a couple of months. Labs with PCP and Cardiology. return annually or prn  An After Visit Summary was printed and given to the patient.

## 2013-03-05 ENCOUNTER — Telehealth: Payer: Self-pay | Admitting: Cardiovascular Disease

## 2013-03-05 NOTE — Telephone Encounter (Signed)
Pt called today because she has been having swelling in her hands feet and ankles over a month. Pt denies SOB, she states her swelling goes down during the night, at the end of the day her feet swells over the border of her shoes. Pt states something similar happened 2 years ago, but then she  was taken a medication  she is not taken it now. Pt takes  Diltiazem 120 mg, Crestor 10 mg daily and propanolol as needed.

## 2013-03-05 NOTE — Telephone Encounter (Signed)
Dr [name] aware of pt's symptoms and states that he does not think pt's symptoms is heart related, MD  recommends for pt to make sure not to overdue the water intake and limit salt intake; MD also recommends to add LFT to the labs on pt for 03/08/13. Pt will call early next weak to make and appointment to been per DR. Nahser MD. Pt is aware and  verbalized understanding.

## 2013-03-05 NOTE — Telephone Encounter (Signed)
New problem    For about 1 month pt has been having swelling in hands,feet, & ankles and wonders if this has anything to do w/1 of her medications

## 2013-03-08 ENCOUNTER — Other Ambulatory Visit (INDEPENDENT_AMBULATORY_CARE_PROVIDER_SITE_OTHER): Payer: PRIVATE HEALTH INSURANCE

## 2013-03-08 ENCOUNTER — Encounter: Payer: Self-pay | Admitting: Cardiovascular Disease

## 2013-03-08 DIAGNOSIS — R000 Tachycardia, unspecified: Secondary | ICD-10-CM

## 2013-03-08 DIAGNOSIS — E785 Hyperlipidemia, unspecified: Secondary | ICD-10-CM

## 2013-03-08 DIAGNOSIS — I498 Other specified cardiac arrhythmias: Secondary | ICD-10-CM

## 2013-03-11 ENCOUNTER — Other Ambulatory Visit: Payer: PRIVATE HEALTH INSURANCE

## 2013-03-11 LAB — NMR, LIPOPROFILE

## 2013-03-11 LAB — HEPATIC FUNCTION PANEL
AST: 49 U/L — ABNORMAL HIGH (ref 0–37)
Albumin: 4.6 g/dL (ref 3.5–5.2)
Total Bilirubin: 0.6 mg/dL (ref 0.3–1.2)

## 2013-03-12 LAB — BASIC METABOLIC PANEL
CO2: 31 mEq/L (ref 19–32)
Calcium: 9.4 mg/dL (ref 8.4–10.5)
Chloride: 103 mEq/L (ref 96–112)
Glucose, Bld: 90 mg/dL (ref 70–99)
Potassium: 3.7 mEq/L (ref 3.5–5.1)
Sodium: 139 mEq/L (ref 135–145)

## 2013-03-13 ENCOUNTER — Telehealth: Payer: Self-pay | Admitting: Cardiovascular Disease

## 2013-03-13 NOTE — Telephone Encounter (Signed)
Discussed recent lab results with pt.  

## 2013-03-13 NOTE — Telephone Encounter (Signed)
New problem   Pt stated she was returning someone's call back from yesterday concerning her lab work, but she didn't get a name. Please call pt

## 2013-05-14 ENCOUNTER — Other Ambulatory Visit: Payer: Self-pay | Admitting: Cardiovascular Disease

## 2013-05-14 ENCOUNTER — Telehealth: Payer: Self-pay | Admitting: Obstetrics and Gynecology

## 2013-05-14 ENCOUNTER — Other Ambulatory Visit: Payer: Self-pay | Admitting: Family Medicine

## 2013-05-14 DIAGNOSIS — M545 Low back pain, unspecified: Secondary | ICD-10-CM

## 2013-05-14 DIAGNOSIS — M79604 Pain in right leg: Secondary | ICD-10-CM

## 2013-05-14 NOTE — Telephone Encounter (Signed)
Pt thinks she may have a yeast infection but can't come into office because of a ruptured disc. Please call

## 2013-05-14 NOTE — Telephone Encounter (Signed)
Fax Received. Refill Completed. Luiza Carranco Chowoe (R.M.A)   

## 2013-05-14 NOTE — Telephone Encounter (Signed)
OK to refill Diflucan 150 mg X 2 doses as directed

## 2013-05-14 NOTE — Telephone Encounter (Signed)
Patient of Dr. Edward Jolly with a history of Yeast infection. States she has tried OTC meds and have not helped. States can not come today due to back issues . Had a MRI on last Friday and is avoiding bed rest and standing only . Symptoms stated as a lot of itching and mild discharge. [facility]. States that fluconazole always helps with this. Please advise.

## 2013-05-15 MED ORDER — FLUCONAZOLE 150 MG PO TABS: 150.0000 mg | ORAL_TABLET | Freq: Once | ORAL | Status: DC

## 2013-05-15 NOTE — Telephone Encounter (Signed)
Patient notified of Rx for Diflucan called to her [facility] pharmacy. Patient appreciates this.

## 2013-05-18 ENCOUNTER — Ambulatory Visit
Admission: RE | Admit: 2013-05-18 | Discharge: 2013-05-18 | Disposition: A | Discharge status: Home / Self Care | Payer: PRIVATE HEALTH INSURANCE | Source: Ambulatory Visit | Attending: Family Medicine | Admitting: Family Medicine

## 2013-05-18 DIAGNOSIS — M545 Low back pain, unspecified: Secondary | ICD-10-CM

## 2013-05-18 DIAGNOSIS — M79604 Pain in right leg: Secondary | ICD-10-CM

## 2013-05-18 MED ORDER — GADOBENATE DIMEGLUMINE 529 MG/ML IV SOLN
15.0000 mL | Freq: Once | INTRAVENOUS | Status: AC | PRN
  Administered 2013-05-18: 15 mL via INTRAVENOUS

## 2013-06-17 ENCOUNTER — Encounter: Payer: Self-pay | Admitting: Cardiovascular Disease

## 2013-06-17 ENCOUNTER — Ambulatory Visit (INDEPENDENT_AMBULATORY_CARE_PROVIDER_SITE_OTHER): Payer: PRIVATE HEALTH INSURANCE | Admitting: Cardiovascular Disease

## 2013-06-17 VITALS — BP 112/80 | HR 85 | Ht 66.0 in | Wt 153.1 lb

## 2013-06-17 DIAGNOSIS — R109 Unspecified abdominal pain: Secondary | ICD-10-CM

## 2013-06-17 DIAGNOSIS — I498 Other specified cardiac arrhythmias: Secondary | ICD-10-CM

## 2013-06-17 DIAGNOSIS — R000 Tachycardia, unspecified: Secondary | ICD-10-CM

## 2013-06-17 DIAGNOSIS — E785 Hyperlipidemia, unspecified: Secondary | ICD-10-CM

## 2013-06-17 DIAGNOSIS — R0789 Other chest pain: Secondary | ICD-10-CM

## 2013-06-17 NOTE — Assessment & Plan Note (Signed)
She has been walking regularly. Continue with her current diet and exercise.

## 2013-06-17 NOTE — Assessment & Plan Note (Signed)
Lauren Montes  is doing well. Her sinus tachycardia is well-controlled. Continue with her same medications.

## 2013-06-17 NOTE — Patient Instructions (Addendum)
Your physician wants you to follow-up in: 1 year  You will receive a reminder letter in the mail two months in advance. If you don't receive a letter, please call our office to schedule the follow-up appointment.  Your physician recommends that you return for a FASTING lipid profile: 1 year    Your physician recommends that you continue on your current medications as directed. Please refer to the Current Medication list given to you today.

## 2013-06-17 NOTE — Progress Notes (Signed)
Lauren Montes Date of Birth  [birth year] [facility] / [facility] [address]     [address] [place], [place]  [address] [phone]  Fax  [phone]  1. Hyperlipidemia 2. Sinus tachycardia 3. Chronic asthma  History of Present Illness:  Lauren Montes is a middle-age female the history of mild diastolic dysfunction as well as leg edema. She also has a history of hypercholesterolemia.   Has a history of hyperlipidemia. We have had her on Crestor the past which she tolerated very well. Her insurance and he refused to pay for to try her on atorvastatin. This caused her to have lots of muscle aches as well as memory loss. We discontinued the atorvastatin at that time.  She has been recording her heart rate and blood pressure readings. Her heart rate is frequently above 100. He also has many readings in the 80-90 range.  Has bruising particularly on her hands and on her fingers. [name] never had any easy bleeding. She did not have any bleeding  complications. during her pregnancies or deliveries.  Feb. 4, 2014: Lauren Montes is doing well.  Her HR and BP have been well controlled.   June 17, 2013:  Lauren Montes is doing well.  She has started waking over the past several weeks.     Current Outpatient Prescriptions  Medication Sig Dispense Refill  . calcium-vitamin D (OSCAL WITH D) 500-200 MG-UNIT per tablet Take 1 tablet by mouth daily.       . cyclobenzaprine (FLEXERIL) 10 MG tablet Take 10 mg by mouth as needed.       . cycloSPORINE (RESTASIS) 0.05 % ophthalmic emulsion Place 1 drop into both eyes 2 (two) times daily.      Marland Kitchen diltiazem (DILACOR XR) 120 MG 24 hr capsule TAKE 1 CAPSULE (120 MG TOTAL) BY MOUTH DAILY.  30 capsule  6  . estradiol (CLIMARA - DOSED IN MG/24 HR) 0.0375 mg/24hr Place 1 patch (0.0375 mg total) onto the skin once a week.  13 patch  04  . fexofenadine (ALLEGRA) 180 MG tablet Take 180 mg by mouth daily.      . fluconazole (DIFLUCAN) 150 MG tablet  Take 1 tablet (150 mg total) by mouth once. May repeat in 48 - 72 hours if symptoms still present.  2 tablet  0  . fluticasone-salmeterol (ADVAIR HFA) 115-21 MCG/ACT inhaler Inhale 2 puffs into the lungs 2 (two) times daily.      Marland Kitchen levalbuterol (XOPENEX HFA) 45 MCG/ACT inhaler Inhale 1-2 puffs into the lungs every 4 (four) hours as needed.        . montelukast (SINGULAIR) 10 MG tablet Take 10 mg by mouth at bedtime.        . nabumetone (RELAFEN) 750 MG tablet Take 500 mg by mouth daily.       . Omega-3 Fatty Acids (OMEGA 3 PO) Take 1 capsule by mouth daily.        Maxwell Caul Bicarbonate (ZEGERID) 20-1100 MG CAPS Take 1 capsule by mouth 2 (two) times daily.  14 each  0  . propranolol (INDERAL) 10 MG tablet Take 1 tablet (10 mg total) by mouth 4 (four) times daily as needed. FOR PALPITATIONS  30 tablet  6  . QNASL 80 MCG/ACT AERS Place 80 % into the nose.      . rosuvastatin (CRESTOR) 10 MG tablet Take 1 tablet (10 mg total) by mouth daily.  30 tablet  5   No current facility-administered medications for this visit.  Allergies  Allergen Reactions  . Lipitor (Atorvastatin) Other (See Comments)    Muscle ache, mind foggy,   . Codeine     Past Medical History  Diagnosis Date  . History of diastolic dysfunction   . Asthma   . History of migraine headaches   . Leg edema     left leg  . Tachycardia   . Cystic teratoma   . Elevated cholesterol   . Ocular rosacea   . GERD (gastroesophageal reflux disease)   . Endometriosis     Past Surgical History  Procedure Laterality Date  . Foot surgery      LEFT FOOT  . Cardiovascular stress test  07/02/2008    EF 55-60%  . Appendectomy    . Bunionectomy      bilateral  . Left breast mass removed    . Rso  2002    partial  . Dental surgery  2007  . Ruptured disc surgery  07,08  . Back surgery    . Oophorectomy  1982    RSO partial and LSO, uterus  . Total abdominal hysterectomy      History  Smoking status  . Never  Smoker   Smokeless tobacco  . Never Used    History  Alcohol Use  . 2.4 oz/week  . 4 Glasses of wine per week    Family History  Problem Relation Age of Onset  . Coronary artery disease Father   . Hypertension Father   . Heart disease Father   . Asthma Mother   . Hypertension Mother   . Alzheimer's disease Maternal Grandmother   . Lung cancer Maternal Grandfather   . Heart failure Paternal Grandfather   . Colon cancer Neg Hx   . Colon polyps Neg Hx   . Rectal cancer Neg Hx   . Stomach cancer Neg Hx   . Skin cancer Sister   . Hepatitis C Brother     Reviw of Systems:  Reviewed in the HPI.  All other systems are negative.  Physical Exam: BP 112/80  Pulse 85  Ht 5\' 6"  (1.676 m)  Wt 153 lb 1.9 oz (69.455 kg)  BMI 24.73 kg/m2  SpO2 97%  LMP 11/07/1980 The patient is alert and oriented x 3.  The mood and affect are normal.   Skin: warm and dry.  Color is normal.    HEENT:   the sclera are nonicteric.  The mucous membranes are moist.  The carotids are 2+ without bruits.  There is no thyromegaly.  There is no JVD.    Lungs: clear.  The chest wall is non tender.    Heart: regular rate with a normal S1 and S2.  There are no murmurs, gallops, or rubs. The PMI is not displaced.     Abdomin: good bowel sounds.  There is no guarding or rebound.  There is no hepatosplenomegaly or tenderness.  There are no masses.   Extremities:  Trace edema.  The legs are without rashes.  The distal pulses are intact.   Neuro:  Cranial nerves II - XII are intact.  Motor and sensory functions are intact.    The gait is normal.  ECG: Normal sinus rhythm 83 beats a minute. She has no ST or T wave changes. She has right axis deviation. Assessment / Plan:

## 2013-06-18 ENCOUNTER — Other Ambulatory Visit: Payer: Self-pay | Admitting: Cardiovascular Disease

## 2013-06-27 ENCOUNTER — Encounter: Payer: Self-pay | Admitting: Obstetrics and Gynecology

## 2013-07-02 ENCOUNTER — Other Ambulatory Visit: Payer: Self-pay

## 2013-07-02 DIAGNOSIS — Z1231 Encounter for screening mammogram for malignant neoplasm of breast: Secondary | ICD-10-CM

## 2013-08-02 ENCOUNTER — Ambulatory Visit
Admission: RE | Admit: 2013-08-02 | Discharge: 2013-08-02 | Disposition: A | Discharge status: Home / Self Care | Payer: PRIVATE HEALTH INSURANCE | Source: Ambulatory Visit

## 2013-08-02 DIAGNOSIS — Z1231 Encounter for screening mammogram for malignant neoplasm of breast: Secondary | ICD-10-CM

## 2013-09-05 ENCOUNTER — Telehealth: Payer: Self-pay | Admitting: *Deleted

## 2013-09-12 ENCOUNTER — Other Ambulatory Visit: Payer: Self-pay

## 2013-10-09 HISTORY — PX: BREAST SURGERY: SHX581

## 2013-11-05 ENCOUNTER — Other Ambulatory Visit: Payer: Self-pay | Admitting: Plastic Surgery

## 2013-12-11 ENCOUNTER — Other Ambulatory Visit: Payer: Self-pay | Admitting: Cardiovascular Disease

## 2013-12-13 ENCOUNTER — Other Ambulatory Visit (HOSPITAL_COMMUNITY): Payer: Self-pay | Admitting: Internal Medicine

## 2013-12-13 DIAGNOSIS — R1310 Dysphagia, unspecified: Secondary | ICD-10-CM

## 2013-12-15 ENCOUNTER — Other Ambulatory Visit: Payer: Self-pay | Admitting: Cardiovascular Disease

## 2013-12-19 ENCOUNTER — Inpatient Hospital Stay (HOSPITAL_COMMUNITY): Admission: RE | Admit: 2013-12-19 | Payer: PRIVATE HEALTH INSURANCE | Source: Ambulatory Visit

## 2013-12-19 ENCOUNTER — Other Ambulatory Visit (HOSPITAL_COMMUNITY): Payer: Self-pay | Admitting: Internal Medicine

## 2013-12-19 ENCOUNTER — Ambulatory Visit (HOSPITAL_COMMUNITY): Admission: RE | Admit: 2013-12-19 | Payer: PRIVATE HEALTH INSURANCE | Source: Ambulatory Visit

## 2013-12-19 ENCOUNTER — Ambulatory Visit (HOSPITAL_COMMUNITY)
Admission: RE | Admit: 2013-12-19 | Discharge: 2013-12-19 | Disposition: A | Discharge status: Home / Self Care | Payer: PRIVATE HEALTH INSURANCE | Source: Ambulatory Visit | Attending: Internal Medicine | Admitting: Internal Medicine

## 2013-12-19 DIAGNOSIS — R1310 Dysphagia, unspecified: Secondary | ICD-10-CM | POA: Insufficient documentation

## 2014-01-13 ENCOUNTER — Other Ambulatory Visit: Payer: Self-pay | Admitting: Cardiovascular Disease

## 2014-02-26 ENCOUNTER — Ambulatory Visit: Payer: PRIVATE HEALTH INSURANCE | Admitting: Obstetrics and Gynecology

## 2014-03-03 ENCOUNTER — Ambulatory Visit (INDEPENDENT_AMBULATORY_CARE_PROVIDER_SITE_OTHER): Payer: PRIVATE HEALTH INSURANCE | Admitting: Obstetrics and Gynecology

## 2014-03-03 ENCOUNTER — Encounter: Payer: Self-pay | Admitting: Obstetrics and Gynecology

## 2014-03-03 VITALS — BP 130/82 | HR 80 | Ht 66.0 in | Wt 156.6 lb

## 2014-03-03 DIAGNOSIS — Z0000 Encounter for general adult medical examination without abnormal findings: Secondary | ICD-10-CM

## 2014-03-03 DIAGNOSIS — B373 Candidiasis of vulva and vagina: Secondary | ICD-10-CM

## 2014-03-03 DIAGNOSIS — E8940 Asymptomatic postprocedural ovarian failure: Secondary | ICD-10-CM

## 2014-03-03 DIAGNOSIS — Z01419 Encounter for gynecological examination (general) (routine) without abnormal findings: Secondary | ICD-10-CM

## 2014-03-03 DIAGNOSIS — R32 Unspecified urinary incontinence: Secondary | ICD-10-CM

## 2014-03-03 DIAGNOSIS — Z79890 Hormone replacement therapy (postmenopausal): Secondary | ICD-10-CM

## 2014-03-03 DIAGNOSIS — B3731 Acute candidiasis of vulva and vagina: Secondary | ICD-10-CM

## 2014-03-03 LAB — POCT URINALYSIS DIPSTICK
Bilirubin, UA: NEGATIVE
Glucose, UA: NEGATIVE
Ketones, UA: NEGATIVE
Leukocytes, UA: NEGATIVE
Nitrite, UA: NEGATIVE
PH UA: 5
Protein, UA: NEGATIVE
RBC UA: NEGATIVE
UROBILINOGEN UA: NEGATIVE

## 2014-03-03 MED ORDER — NYSTATIN-TRIAMCINOLONE 100000-0.1 UNIT/GM-% EX CREA: 1.0000 "application " | TOPICAL_CREAM | Freq: Two times a day (BID) | CUTANEOUS | Status: DC

## 2014-03-03 MED ORDER — ESTRADIOL 0.0375 MG/24HR TD PTWK: 0.0375 mg | MEDICATED_PATCH | TRANSDERMAL | Status: DC

## 2014-03-03 MED ORDER — FLUCONAZOLE 150 MG PO TABS: 150.0000 mg | ORAL_TABLET | Freq: Once | ORAL | Status: DC

## 2014-03-03 NOTE — Patient Instructions (Signed)

## 2014-03-03 NOTE — Progress Notes (Addendum)
Patient ID: Lauren Montes, female   DOB: 1956-02-01, 58 y.o.   MRN: [id number] GYNECOLOGY VISIT  PCP:   Domenick Gong, MD  Referring provider:   HPI: 58 y.o.   Married  Caucasian  female   G1P1001 with Patient's last menstrual period was 11/07/1980.   here for   AEX. On Climara 0.0375 mg on weekly.  Takes a break for a couple of days when she does the switch.  Has hot flashes when stops.  Wants to continue.  Her Cardiologist is OK with continuing.   Had breast reduction surgery in December 2014 - Dr. Dessie Coma. Very happy with surgery. Has a rash behind the left breast. Seen by surgeon and told it may lymph node reaction.  Will see him again next week.  No drainage.  Has some left breast pain.   Back in exercise but nothing too strenuous.   Has seen rheumatologist - Dr. Corena Pilgrim.  Treated with Lyrica and Cymbalta.  Working wonders for her pain.   Has some osteoarthritis.   Last bone density in 2012 - normal at [facility].  Takes periodic steroids 2 - 3 times a year due to asthma.   Chronic vaginal itching, esp at night.  No pain with intercourse.  Occasional odor.   Having bladder control problems. Leakage with coughing, sneezing, and laughing. Coughs due to asthma.  Training to void at times.  Ongoing constipation.  BMs three times a week.  Wears pads. No leakage without warning.   Hgb:    PCP Urine:  Neg  GYNECOLOGIC HISTORY: Patient's last menstrual period was 11/07/1980. Sexually active:  yes Partner preference: female Contraception:  Hysterectomy  Menopausal hormone therapy:  Estradiol patch DES exposure:   no Blood transfusions:   1975 after delivery of child Sexually transmitted diseases:  no  GYN procedures and prior surgeries:  Total abdominal hysterectomy/LSO, RSO in 2002, Rt. Breast mass removed over 20 years [id number], breast reduction 10/2013. Last mammogram:  08-02-2013 wnl:[facility]               Last pap and high risk  HPV testing:  02-07-11 wnl:no HPV testing. History of abnormal pap smear:  no   OB History   Grav Para Term Preterm Abortions TAB SAB Ect Mult Living   1 1 1       1        LIFESTYLE: Exercise:   Weight bearing & walking            Tobacco:   no Alcohol:      7 glasses of wine per week Drug use:   no  OTHER HEALTH MAINTENANCE: Tetanus/TDap:   Up to date with PCP Gardisil:              n/a Influenza:            08/2013 Zostavax:             n/a  Bone density:     02-16-11 normal Colonoscopy:    2010 wnl with Dr. Fuller Plan.  Next colonoscopy due 2022.  Cholesterol check:   wnl with PCP  Family History  Problem Relation Age of Onset  . Coronary artery disease Father   . Hypertension Father   . Heart disease Father   . Asthma Mother   . Hypertension Mother   . Alzheimer's disease Maternal Grandmother   . Lung cancer Maternal Grandfather   . Heart failure Paternal Grandfather   . Colon cancer Neg Hx   .  Colon polyps Neg Hx   . Rectal cancer Neg Hx   . Stomach cancer Neg Hx   . Skin cancer Sister   . Hepatitis C Brother     Patient Active Problem List   Diagnosis Date Noted  . Sinus tachycardia 09/14/2012  . Dysphagia 06/29/2012  . Ocular rosacea   . GERD (gastroesophageal reflux disease)   . Cough 09/09/2011  . Chest tightness, discomfort, or pressure 09/09/2011  . Hyperlipidemia 05/10/2011  . SWELLING OF LIMB 01/20/2011  . GERD 02/03/2010  . ABDOMINAL PAIN-EPIGASTRIC 02/03/2010  . ABDOMINAL PAIN-MULTIPLE SITES 02/03/2010  . CONSTIPATION 09/19/2008  . ABDOMINAL PAIN, LEFT LOWER QUADRANT 09/19/2008  . COLONIC POLYPS, HYPERPLASTIC, HX OF 09/19/2008   Past Medical History  Diagnosis Date  . History of diastolic dysfunction   . Asthma   . History of migraine headaches   . Leg edema     left leg  . Tachycardia   . Cystic teratoma   . Elevated cholesterol   . Ocular rosacea   . GERD (gastroesophageal reflux disease)   . Endometriosis   . Blood transfusion  without reported diagnosis 1975    with vaginal delivery--very anemic    Past Surgical History  Procedure Laterality Date  . Foot surgery      LEFT FOOT  . Cardiovascular stress test  07/02/2008    EF 55-60%  . Appendectomy    . Bunionectomy      bilateral  . Left breast mass removed    . Rso  2002    partial  . Dental surgery  2007  . Ruptured disc surgery  07,08  . Back surgery    . Oophorectomy  1982    RSO partial and LSO, uterus  . Total abdominal hysterectomy    . Breast surgery  10-09-13    breast reduction--Dr. Dessie Coma    ALLERGIES: Lipitor and Codeine  Current Outpatient Prescriptions  Medication Sig Dispense Refill  . calcium-vitamin D (OSCAL WITH D) 500-200 MG-UNIT per tablet Take 1 tablet by mouth daily.       . cetirizine (ZYRTEC) 10 MG tablet Take 10 mg by mouth daily.      . chlorpheniramine (CHLOR-TRIMETON) 4 MG tablet Take 4 mg by mouth as needed for allergies.      Marland Kitchen CRESTOR 10 MG tablet TAKE 1 TABLET (10 MG TOTAL) BY MOUTH DAILY.  30 tablet  5  . cyclobenzaprine (FLEXERIL) 10 MG tablet Take 10 mg by mouth as needed.       . cycloSPORINE (RESTASIS) 0.05 % ophthalmic emulsion Place 1 drop into both eyes 2 (two) times daily.      Marland Kitchen diltiazem (CARDIZEM CD) 120 MG 24 hr capsule Take 120 mg by mouth daily.      . DULoxetine (CYMBALTA) 30 MG capsule Take 30 mg by mouth daily.      Marland Kitchen estradiol (CLIMARA - DOSED IN MG/24 HR) 0.0375 mg/24hr Place 1 patch (0.0375 mg total) onto the skin once a week.  13 patch  04  . fluticasone-salmeterol (ADVAIR HFA) 115-21 MCG/ACT inhaler Inhale 2 puffs into the lungs 2 (two) times daily.      . furosemide (LASIX) 20 MG tablet Take 20 mg by mouth daily.      Marland Kitchen levalbuterol (XOPENEX HFA) 45 MCG/ACT inhaler Inhale 1-2 puffs into the lungs every 4 (four) hours as needed.        Marland Kitchen LYRICA 50 MG capsule Take 50 mg by mouth daily.      Marland Kitchen  methocarbamol (ROBAXIN) 500 MG tablet Take 500 mg by mouth as needed.      . metroNIDAZOLE  (METROCREAM) 0.75 % cream Apply 1 application topically as needed.      . montelukast (SINGULAIR) 10 MG tablet Take 10 mg by mouth at bedtime.        . nabumetone (RELAFEN) 750 MG tablet Take 500 mg by mouth daily.       . Omega-3 Fatty Acids (OMEGA 3 PO) Take 1 capsule by mouth daily.        Earney Navy Bicarbonate (ZEGERID) 20-1100 MG CAPS Take 1 capsule by mouth 2 (two) times daily.  14 each  0  . Polyethyl Glycol-Propyl Glycol (SYSTANE) 0.4-0.3 % SOLN Apply 1 drop to eye as needed.      . QNASL 80 MCG/ACT AERS Place 80 % into the nose.       No current facility-administered medications for this visit.     ROS:  Pertinent items are noted in HPI.  SOCIAL HISTORY:    PHYSICAL EXAMINATION:    BP 130/82  Pulse 80  Ht 5\' 6"  (1.676 m)  Wt 156 lb 9.6 oz (71.033 kg)  BMI 25.29 kg/m2  LMP 11/07/1980   Wt Readings from Last 3 Encounters:  03/03/14 156 lb 9.6 oz (71.033 kg)  06/17/13 153 lb 1.9 oz (69.455 kg)  02/25/13 157 lb (71.215 kg)     Ht Readings from Last 3 Encounters:  03/03/14 5\' 6"  (1.676 m)  06/17/13 5\' 6"  (1.676 m)  02/25/13 5\' 6"  (1.676 m)    General appearance: alert, cooperative and appears stated age Head: Normocephalic, without obvious abnormality, atraumatic Neck: no adenopathy, supple, symmetrical, trachea midline and thyroid not enlarged, symmetric, no tenderness/mass/nodules Lungs: clear to auscultation bilaterally Breasts: consistent with bilateral breast reduction, No nipple retraction or dimpling, No nipple discharge or bleeding, No axillary or supraclavicular adenopathy, Normal to palpation without dominant masses, left infra-axillary swelling.  Heart: regular rate and rhythm Abdomen: soft, non-tender; no masses,  no organomegaly Extremities: extremities normal, atraumatic, no cyanosis or edema Skin: Skin color, texture, turgor normal. No rashes or lesions Lymph nodes: Cervical, supraclavicular, and axillary nodes normal. No abnormal inguinal  nodes palpated Neurologic: Grossly normal  Pelvic: External genitalia:  no lesions              Urethra:  normal appearing urethra with no masses, tenderness or lesions              Bartholins and Skenes: normal                 Vagina: normal appearing vagina with normal color and moderate amount of white vaginal creamy discharge, no lesions, minimal cystocele.               Cervix:  absent              Pap and high risk HPV testing done: no.            Bimanual Exam:  Uterus:   absent                                      Adnexa:  no masses                                      Rectovaginal: Confirms  Anus:  normal sphincter tone, no lesions  Wet prep - pH 4.0, negative clue cells, negative yeast, negative trichomonas.   ASSESSMENT  Normal gynecologic exam. Status post TAH/LSO. Status post RSO.  ERT patient.  Status post bilateral breast reduction.  Possible lymphedema.  Osteoarthritis.   Stress incontinence. ?yeast vaginitis?  PLAN  Mammogram recommended yearly.  Follow up with breast surgeon.  Pap smear and high risk HPV testing not indicated.  Will continue with ERT - Climara 0.0375 mg weekly for one year.  See Epic. I reviewed benefits and risks of breast CA, stroke, PE, MI, and DVT. Counseled on self breast exam, Calcium and vitamin D intake, exercise. Will treat with Mycolog II cream externally and Diflucan internally.  See Epic orders.  If symptoms do not resolve in 2 weeks, will return for re-evaluation and discussion of vaginal estrogen therapy.  Discussed stress incontinence and treatment with Kegel's or midurethral sling. ACOG information to patient.  Patient chooses Kegel's at this time.  Return annually or prn   An After Visit Summary was printed and given to the patient.  An additional 15 minutes spent discussion vaginitis and genuine stress incontinence and treatment options.

## 2014-04-08 ENCOUNTER — Encounter: Payer: Self-pay | Admitting: Certified Nurse Midwife

## 2014-04-08 ENCOUNTER — Ambulatory Visit (INDEPENDENT_AMBULATORY_CARE_PROVIDER_SITE_OTHER): Payer: PRIVATE HEALTH INSURANCE | Admitting: Certified Nurse Midwife

## 2014-04-08 VITALS — BP 104/64 | HR 68 | Temp 97.9°F | Resp 16 | Ht 66.0 in | Wt 156.0 lb

## 2014-04-08 DIAGNOSIS — R350 Frequency of micturition: Secondary | ICD-10-CM

## 2014-04-08 DIAGNOSIS — B373 Candidiasis of vulva and vagina: Secondary | ICD-10-CM

## 2014-04-08 DIAGNOSIS — B3731 Acute candidiasis of vulva and vagina: Secondary | ICD-10-CM

## 2014-04-08 DIAGNOSIS — N390 Urinary tract infection, site not specified: Secondary | ICD-10-CM

## 2014-04-08 LAB — POCT URINALYSIS DIPSTICK
Bilirubin, UA: NEGATIVE
Blood, UA: NEGATIVE
Glucose, UA: NEGATIVE
Ketones, UA: NEGATIVE
Nitrite, UA: NEGATIVE
PROTEIN UA: NEGATIVE
UROBILINOGEN UA: NEGATIVE
pH, UA: 5

## 2014-04-08 MED ORDER — TERCONAZOLE 0.4 % VA CREA: 1.0000 | TOPICAL_CREAM | Freq: Every day | VAGINAL | Status: DC

## 2014-04-08 NOTE — Patient Instructions (Signed)
Atrophic Vaginitis Atrophic vaginitis is a problem of low levels of estrogen in women. This problem can happen at any age. It is most common in women who have gone through menopause ("the change").  HOW WILL I KNOW IF I HAVE THIS PROBLEM? You may have:  Trouble with peeing (urinating), such as:  Going to the bathroom often.  A hard time holding your pee until you reach a bathroom.  Leaking pee.  Having pain when you pee.  Itching or a burning feeling.  Vaginal bleeding and spotting.  Pain during sex.  Dryness of the vagina.  A yellow, bad-smelling fluid (discharge) coming from the vagina. HOW WILL MY DOCTOR CHECK FOR THIS PROBLEM?  During your exam, your doctor will likely find the problem.  If there is a vaginal fluid, it may be checked for infection. HOW WILL THIS PROBLEM BE TREATED? Keep the vulvar skin as clean as possible. Moisturizers and lubricants can help with some of the symptoms. Estrogen replacement can help. There are 2 ways to take estrogen:  Systemic estrogen gets estrogen to your whole body. It takes many weeks or months before the symptoms get better.  You take an estrogen pill.  You use a skin patch. This is a patch that you put on your skin.  If you still have your uterus, your doctor may ask you to take a hormone. Talk to your doctor about the right medicine for you.  Estrogen cream.  This puts estrogen only at the part of your body where you apply it. The cream is put into the vagina or put on the vulvar skin. For some women, estrogen cream works faster than pills or the patch. CAN ALL WOMEN WITH THIS PROBLEM USE ESTROGEN? No. Women with certain types of cancer, liver problems, or problems with blood clots should not take estrogen. Your doctor can help you decide the best treatment for your symptoms. Document Released: 04/11/2008 Document Revised: 01/16/2012 Document Reviewed: 04/11/2008 ExitCare Patient Information 2014 [employer].  

## 2014-04-08 NOTE — Progress Notes (Signed)
58 y.o. married white female g1p1 here with complaint of vaginal symptoms of itching, burning, and no increase discharge. Was given  Mycolog cream to help with external itching, this did not help. Was treated  03/03/14 with Mycolog and Diflucan with only slight change in symptoms..  Onset of symptoms 2-days ago. Denies new personal products. Patient has been out of town and very busy.  Patient was having urinary frequency with pressure one day ago and all has stopped now. Denies back pain, fever or chills. Denies pain with urination,urgency or burning with urination. Last sexual activity on week ago. Does not use lubricant for sexual activity. Has stress incontinence on occasion. Marland Kitchen   O:Healthy female WDWN Affect: normal, orientation x 3 Skin warm and dry  Exam: Abdomen:soft, non tender, negative suprapubic Lymph node: no enlargement or tenderness Pelvic exam: External genital:increase pink, scaling with no exudate wet prep taken, slight atrophic appearance BUS: negative Bladder and urethral meatus non tender. Vagina: scant discharge noted. Ph:4.5  ,Wet prep taken,very mild cystocele noted Cervix: absent Uterus: absent Adnexa: not present, no fullness noted Poct urine-wbc trace Wet Prep results: positive yeast external and internal   A:Yeast vaginitis/vulvitis Slight vaginal atrophy Occasional stress incontinence R/O UTI   P:Discussed findings of yeast vaginitis and vulvitis and etiology. Discussed Aveeno  sitz bath for comfort. Avoid moist clothes or pads for extended period of time. If working out in gym clothes or swim suits for long periods of time change underwear or bottoms of swimsuit if possible. Olive Oil use for skin protection prior to activity can be used to external skin. Discussed vaginal dryness and role in vaginal itching and discomfort. Discussed OTC options for treatment and estrogen use. Patient plans to try Olive Oil every am and treat with medication in pm. Questions  addressed. Rx: Terazol 7 vaginal cream see order Lab: Urine micro/culture Patient will call if urinary symptoms reoccur.  Rv in two weeks to assess treatment, prn

## 2014-04-09 LAB — URINALYSIS, MICROSCOPIC ONLY
Casts: NONE SEEN
Squamous Epithelial / LPF: NONE SEEN

## 2014-04-11 ENCOUNTER — Telehealth: Payer: Self-pay | Admitting: Certified Nurse Midwife

## 2014-04-11 ENCOUNTER — Other Ambulatory Visit: Payer: Self-pay | Admitting: Certified Nurse Midwife

## 2014-04-11 DIAGNOSIS — N390 Urinary tract infection, site not specified: Secondary | ICD-10-CM

## 2014-04-11 LAB — URINE CULTURE: Colony Count: 85000

## 2014-04-11 MED ORDER — NITROFURANTOIN MONOHYD MACRO 100 MG PO CAPS: 100.0000 mg | ORAL_CAPSULE | Freq: Two times a day (BID) | ORAL | Status: DC

## 2014-04-11 NOTE — Progress Notes (Signed)
Reviewed personally.  M. Suzanne Kadeen Sroka, MD.  

## 2014-04-11 NOTE — Telephone Encounter (Signed)
Patient is calling about results from culture the other day. Said she is still having symptoms.

## 2014-04-11 NOTE — Telephone Encounter (Signed)
Spoke with patient. Patient states that she is still having urinary urgency and a feeling of "heaviness." Advised patient of results as seen below from [place]. Patient agreeable and verbalizes understanding. Will begin taking Macrobid today. Has follow up appointment scheduled for 6/16 with Regina Eck CNM and would like to have TOC done at this appointment.   Notes Recorded by Regina Eck, CNM on 04/11/2014 at 11:12 AM Notify patient urine culture positive for E.Coli and needs treatment Rx Macrobid 100 mg bid x 7 days sent to pharmacy TOC 2 weeks urine culture, order in  Routing to provider for final review. Patient agreeable to disposition. Will close encounter.

## 2014-04-22 ENCOUNTER — Encounter: Payer: Self-pay | Admitting: Certified Nurse Midwife

## 2014-04-22 ENCOUNTER — Ambulatory Visit (INDEPENDENT_AMBULATORY_CARE_PROVIDER_SITE_OTHER): Payer: PRIVATE HEALTH INSURANCE | Admitting: Certified Nurse Midwife

## 2014-04-22 VITALS — BP 98/62 | HR 64 | Resp 16 | Ht 66.0 in | Wt 157.0 lb

## 2014-04-22 DIAGNOSIS — B3731 Acute candidiasis of vulva and vagina: Secondary | ICD-10-CM

## 2014-04-22 DIAGNOSIS — B373 Candidiasis of vulva and vagina: Secondary | ICD-10-CM

## 2014-04-22 DIAGNOSIS — N390 Urinary tract infection, site not specified: Secondary | ICD-10-CM

## 2014-04-22 LAB — POCT URINALYSIS DIPSTICK
Bilirubin, UA: NEGATIVE
Blood, UA: NEGATIVE
GLUCOSE UA: NEGATIVE
Ketones, UA: NEGATIVE
Leukocytes, UA: NEGATIVE
NITRITE UA: NEGATIVE
Protein, UA: NEGATIVE
Urobilinogen, UA: NEGATIVE
pH, UA: 5

## 2014-04-22 NOTE — Progress Notes (Signed)
58 y.o. Married white female here for follow up of E.Coli UTI and Yeast vaginitis/vulvitis treated with Macrobid and Terazol cream. g1p1001 here for follow up. Patient completed medication as directed. Denies any urinary frequency, urgency or pain. Patient denies any increase in vaginal discharge or odor. Patient feels much better, but still has only one area that has slight itching at inside of introitus only. No other health issues.  O:Healthy female WDWN Affect: normal, orientation x 3 CVAT negative bilateral Exam: Abdomen: not tender, negative suprapubic Lymph node: no enlargement or tenderness Pelvic exam: External genital: vulva no redness or tenderness wet prep taken BUS: negative Bladder not tender Vagina: normal discharge noted. Ph:3.5   ,Wet prep taken Cervix: normal, non tender Uterus: normal, non tender Adnexa:normal, non tender, no masses or fullness noted   Wet Prep results: Negative yeast vaginally Positive yeast external vulva area only and at entrance of vagina at introitus only   A:UTI probably resolved Yeast vulvitis unresolved  Poct urine-neg  P:Discussed findings of yeast still present and etiology. Discussed Aveeno or baking soda sitz bath for comfort. Avoid moist clothes or pads for extended period of time. If working out in gym clothes or swim suits for long periods of time change underwear or bottoms of swimsuit if possible. Olive Oil use for skin protection prior to activity can be used to external skin. Rx: Mycolog patient still has this and will apply to affected area bid x 5 day. Patient to advise if not resolving. Labs: urine culture/micro  Rv prn

## 2014-04-22 NOTE — Patient Instructions (Signed)
Yeast Infection of the Skin Some yeast on the skin is normal, but sometimes it causes an infection. If you have a yeast infection, it shows up as white or light brown patches on brown skin. You can see it better in the summer on tan skin. It causes light-colored holes in your suntan. It can happen on any area of the body. This cannot be passed from person to person. HOME CARE  Scrub your skin daily with a dandruff shampoo. Your rash may take a couple weeks to get well.  Do not scratch or itch the rash. GET HELP RIGHT AWAY IF:   You get another infection from scratching. The skin may get warm, red, and may ooze fluid.  The infection does not seem to be getting better. MAKE SURE YOU:  Understand these instructions.  Will watch your condition.  Will get help right away if you are not doing well or get worse. Document Released: 10/06/2008 Document Revised: 01/16/2012 Document Reviewed: 10/06/2008 Exit[employer] Patient Information 2014 [employer].  

## 2014-04-23 LAB — URINALYSIS, MICROSCOPIC ONLY
Bacteria, UA: NONE SEEN
Casts: NONE SEEN
Crystals: NONE SEEN
Squamous Epithelial / HPF: NONE SEEN

## 2014-04-23 NOTE — Progress Notes (Signed)
Reviewed personally.  M. Suzanne Miller, MD.  

## 2014-04-24 LAB — URINE CULTURE
Colony Count: NO GROWTH
Organism ID, Bacteria: NO GROWTH

## 2014-05-29 ENCOUNTER — Other Ambulatory Visit: Payer: Self-pay | Admitting: Dermatology

## 2014-06-17 ENCOUNTER — Ambulatory Visit: Payer: PRIVATE HEALTH INSURANCE | Admitting: Cardiovascular Disease

## 2014-06-18 ENCOUNTER — Other Ambulatory Visit: Payer: Self-pay | Admitting: Cardiovascular Disease

## 2014-06-20 ENCOUNTER — Ambulatory Visit (INDEPENDENT_AMBULATORY_CARE_PROVIDER_SITE_OTHER): Payer: PRIVATE HEALTH INSURANCE | Admitting: Cardiovascular Disease

## 2014-06-20 ENCOUNTER — Encounter: Payer: Self-pay | Admitting: Cardiovascular Disease

## 2014-06-20 VITALS — BP 135/90 | HR 82 | Ht 66.0 in | Wt 152.6 lb

## 2014-06-20 DIAGNOSIS — R0602 Shortness of breath: Secondary | ICD-10-CM

## 2014-06-20 DIAGNOSIS — I5032 Chronic diastolic (congestive) heart failure: Secondary | ICD-10-CM | POA: Insufficient documentation

## 2014-06-20 MED ORDER — DILTIAZEM HCL ER COATED BEADS 120 MG PO CP24: 120.0000 mg | ORAL_CAPSULE | Freq: Every day | ORAL | Status: DC

## 2014-06-20 NOTE — Progress Notes (Signed)
Lauren Montes Date of Birth  Oct 30, 1956 [facility] [address]     [place], [place]  [address] [phone]  Fax  [phone]  1. Hyperlipidemia 2. Sinus tachycardia 3. Chronic asthma  History of Present Illness:  Lauren Montes is a middle-age female the history of mild diastolic dysfunction as well as leg edema. She also has a history of hypercholesterolemia.   Has a history of hyperlipidemia. We have had her on Crestor the past which she tolerated very well. Her insurance and he refused to pay for to try her on atorvastatin. This caused her to have lots of muscle aches as well as memory loss. We discontinued the atorvastatin at that time.  She has been recording her heart rate and blood pressure readings. Her heart rate is frequently above 100. He also has many readings in the 80-90 range.  Has bruising particularly on her hands and on her fingers. [name] never had any easy bleeding. She did not have any bleeding  complications. during her pregnancies or deliveries.  Feb. 4, 2014: Lauren Montes is doing well.  Her HR and BP have been well controlled.   June 17, 2013:  Lauren Montes is doing well.  She has started waking over the past several weeks.    June 21, 2014:  Lauren Montes is having some issues with DOE.    Perhaps slight chest pressure with exertion.  No pain. .  Sleeps on 2 pillows occasionally at night - not every night.  [name] very careful with her salt intake. She and her husband thinks most of her meals of scratch. She denies any leg swelling   Current Outpatient Prescriptions  Medication Sig Dispense Refill  . calcium-vitamin D (OSCAL WITH D) 500-200 MG-UNIT per tablet Take 1 tablet by mouth daily.       . cetirizine (ZYRTEC) 10 MG tablet Take 10 mg by mouth daily.      . chlorpheniramine (CHLOR-TRIMETON) 4 MG tablet Take 4 mg by mouth as needed for allergies.      Marland Kitchen CRESTOR 10 MG tablet TAKE 1 TABLET (10 MG TOTAL) BY MOUTH  DAILY.  30 tablet  5  . cyclobenzaprine (FLEXERIL) 10 MG tablet Take 10 mg by mouth as needed.       . cycloSPORINE (RESTASIS) 0.05 % ophthalmic emulsion Place 1 drop into both eyes 2 (two) times daily.      Marland Kitchen diltiazem (CARDIZEM CD) 120 MG 24 hr capsule Take 120 mg by mouth daily.      Marland Kitchen diltiazem (CARDIZEM CD) 120 MG 24 hr capsule TAKE 1 CAPSULE (120 MG TOTAL) BY MOUTH DAILY.  30 capsule  0  . DULoxetine (CYMBALTA) 30 MG capsule Take 30 mg by mouth daily.      Marland Kitchen estradiol (CLIMARA - DOSED IN MG/24 HR) 0.0375 mg/24hr patch Place 1 patch (0.0375 mg total) onto the skin once a week.  12 patch  3  . fluticasone-salmeterol (ADVAIR HFA) 115-21 MCG/ACT inhaler Inhale 2 puffs into the lungs 2 (two) times daily.      . furosemide (LASIX) 20 MG tablet Take 20 mg by mouth daily.      Marland Kitchen levalbuterol (XOPENEX HFA) 45 MCG/ACT inhaler Inhale 1-2 puffs into the lungs every 4 (four) hours as needed.        Marland Kitchen LYRICA 50 MG capsule Take 50 mg by mouth daily.      . methocarbamol (ROBAXIN) 500 MG tablet Take 500 mg by mouth as needed.      Marland Kitchen  montelukast (SINGULAIR) 10 MG tablet Take 10 mg by mouth at bedtime.        . nabumetone (RELAFEN) 500 MG tablet Take 500 mg by mouth as needed.      . nystatin-triamcinolone (MYCOLOG II) cream Apply 1 application topically 2 (two) times daily. Apply to affected area BID for up to 7 days.  60 g  0  . Omega-3 Fatty Acids (OMEGA 3 PO) Take 1 capsule by mouth daily.        Earney Navy Bicarbonate (ZEGERID) 20-1100 MG CAPS Take 1 capsule by mouth 2 (two) times daily.  14 each  0  . QNASL 80 MCG/ACT AERS Place 80 % into the nose.      . terconazole (TERAZOL 7) 0.4 % vaginal cream Place 1 applicator vaginally at bedtime.  45 g  0   No current facility-administered medications for this visit.     Allergies  Allergen Reactions  . Lipitor [Atorvastatin] Other (See Comments)    Muscle ache, mind foggy,   . Codeine     Past Medical History  Diagnosis Date  . History  of diastolic dysfunction   . Asthma   . History of migraine headaches   . Leg edema     left leg  . Tachycardia   . Cystic teratoma   . Elevated cholesterol   . Ocular rosacea   . GERD (gastroesophageal reflux disease)   . Endometriosis   . Blood transfusion without reported diagnosis 1975    with vaginal delivery--very anemic    Past Surgical History  Procedure Laterality Date  . Foot surgery      LEFT FOOT  . Cardiovascular stress test  07/02/2008    EF 55-60%  . Appendectomy    . Bunionectomy      bilateral  . Left breast mass removed    . Rso  2002    partial  . Dental surgery  2007  . Ruptured disc surgery  07,08  . Back surgery    . Oophorectomy  1982    RSO partial and LSO, uterus  . Total abdominal hysterectomy    . Breast surgery  10-09-13    breast reduction--Dr. Holderness    History  Smoking status  . Never Smoker   Smokeless tobacco  . Never Used    History  Alcohol Use  . 4.2 oz/week  . 7 Glasses of wine per week    Family History  Problem Relation Age of Onset  . Coronary artery disease Father   . Hypertension Father   . Heart disease Father   . Asthma Mother   . Hypertension Mother   . Alzheimer's disease Maternal Grandmother   . Lung cancer Maternal Grandfather   . Heart failure Paternal Grandfather   . Colon cancer Neg Hx   . Colon polyps Neg Hx   . Rectal cancer Neg Hx   . Stomach cancer Neg Hx   . Skin cancer Sister   . Hepatitis C Brother     Reviw of Systems:  Reviewed in the HPI.  All other systems are negative.  Physical Exam: BP 135/90  Pulse 82  Ht 5\' 6"  (1.676 m)  Wt 152 lb 9.6 oz (69.219 kg)  BMI 24.64 kg/m2  LMP 11/07/1980 The patient is alert and oriented x 3.  The mood and affect are normal.   Skin: warm and dry.  Color is normal.    HEENT:   the sclera are nonicteric.  The mucous membranes  are moist.  The carotids are 2+ without bruits.  There is no thyromegaly.  There is no JVD.    Lungs: clear.  The  chest wall is non tender.    Heart: regular rate with a normal S1 and S2.  There are no murmurs, gallops, or rubs. The PMI is not displaced.     Abdomin: good bowel sounds.  There is no guarding or rebound.  There is no hepatosplenomegaly or tenderness.  There are no masses.   Extremities:  Trace edema.  The legs are without rashes.  The distal pulses are intact.   Neuro:  Cranial nerves II - XII are intact.  Motor and sensory functions are intact.    The gait is normal.  ECG: June 21, 2014:    Normal sinus rhythm 82 beats a minute. She has no ST or T wave changes. She has right axis deviation. Assessment / Plan:

## 2014-06-20 NOTE — Patient Instructions (Signed)
Your physician has requested that you have en exercise stress myoview. For further information please visit [email or web address]. Please follow instruction sheet, as given.  Your physician has requested that you have an echocardiogram. Echocardiography is a painless test that uses sound waves to create images of your heart. It provides your doctor with information about the size and shape of your heart and how well your heart's chambers and valves are working. This procedure takes approximately one hour. There are no restrictions for this procedure.  Your physician recommends that you schedule a follow-up appointment in: 3 months with Dr. Acie Fredrickson.   Your physician recommends that you continue on your current medications as directed. Please refer to the Current Medication list given to you today.

## 2014-06-20 NOTE — Assessment & Plan Note (Signed)
Lauren Montes presents with worsening shortness breath with exertion. She also complains of having some chest tightness. She was diagnosed with mild diastolic dysfunction echo several years ago. We will repeat the echocardiogram. She has Lasix but really does not take it on a regular basis. We may need to have her take it on a daily or every other day basis.  She also describes some episodes of chest tightness. There is no real pain. We'll schedule her for a stress Myoview study for further evaluation. I'll see her again in 3 months for followup visit.  I'll see her sooner if she has worsening problems.

## 2014-06-25 ENCOUNTER — Ambulatory Visit (HOSPITAL_COMMUNITY): Payer: PRIVATE HEALTH INSURANCE | Attending: Cardiovascular Disease

## 2014-06-25 DIAGNOSIS — I509 Heart failure, unspecified: Secondary | ICD-10-CM | POA: Diagnosis not present

## 2014-06-25 DIAGNOSIS — I5032 Chronic diastolic (congestive) heart failure: Secondary | ICD-10-CM

## 2014-06-25 DIAGNOSIS — R0602 Shortness of breath: Secondary | ICD-10-CM

## 2014-06-25 NOTE — Progress Notes (Signed)
2D Echo completed. 06/25/2014 

## 2014-06-26 ENCOUNTER — Ambulatory Visit (HOSPITAL_COMMUNITY): Payer: PRIVATE HEALTH INSURANCE | Attending: Cardiovascular Disease | Admitting: Radiology

## 2014-06-26 VITALS — BP 119/79 | Ht 66.0 in | Wt 153.0 lb

## 2014-06-26 DIAGNOSIS — R0989 Other specified symptoms and signs involving the circulatory and respiratory systems: Secondary | ICD-10-CM | POA: Diagnosis not present

## 2014-06-26 DIAGNOSIS — J45909 Unspecified asthma, uncomplicated: Secondary | ICD-10-CM | POA: Insufficient documentation

## 2014-06-26 DIAGNOSIS — R0609 Other forms of dyspnea: Secondary | ICD-10-CM | POA: Diagnosis not present

## 2014-06-26 DIAGNOSIS — R079 Chest pain, unspecified: Secondary | ICD-10-CM | POA: Insufficient documentation

## 2014-06-26 DIAGNOSIS — Z8249 Family history of ischemic heart disease and other diseases of the circulatory system: Secondary | ICD-10-CM | POA: Insufficient documentation

## 2014-06-26 DIAGNOSIS — R002 Palpitations: Secondary | ICD-10-CM | POA: Diagnosis not present

## 2014-06-26 DIAGNOSIS — R0602 Shortness of breath: Secondary | ICD-10-CM

## 2014-06-26 DIAGNOSIS — R0789 Other chest pain: Secondary | ICD-10-CM

## 2014-06-26 DIAGNOSIS — I5032 Chronic diastolic (congestive) heart failure: Secondary | ICD-10-CM

## 2014-06-26 MED ORDER — TECHNETIUM TC 99M SESTAMIBI GENERIC - CARDIOLITE
33.0000 | Freq: Once | INTRAVENOUS | Status: AC | PRN
  Administered 2014-06-26: 33 via INTRAVENOUS

## 2014-06-26 MED ORDER — TECHNETIUM TC 99M SESTAMIBI GENERIC - CARDIOLITE
11.0000 | Freq: Once | INTRAVENOUS | Status: AC | PRN
  Administered 2014-06-26: 11 via INTRAVENOUS

## 2014-06-26 NOTE — Progress Notes (Signed)
[facility] [address] [place], [place] [address] [phone]    Cardiology Nuclear Med Study  Lauren Montes is a 58 y.o. female     MRN : [id number]     DOB: 18-Jan-1956  Procedure Date: 06/26/2014  Nuclear Med Background Indication for Stress Test:  Evaluation for Ischemia History:  Asthma and '09 MPI: EF: 55-60% Cardiac Risk Factors: Family History - CAD and Lipids  Symptoms:  Chest Pain, DOE, Palpitations and SOB   Nuclear Pre-Procedure Caffeine/Decaff Intake:  None NPO After: 7:00pm   Lungs:  clear O2 Sat: 98% on room air. IV 0.9% NS with Angio Cath:  22g  IV Site: R Hand  IV Started by:  Crissie Figures, RN  Chest Size (in):  36 Cup Size: D  Height: 5\' 6"  (1.676 m)  Weight:  153 lb (69.4 kg)  BMI:  Body mass index is 24.71 kg/(m^2). Tech Comments:  N/A    Nuclear Med Study 1 or 2 day study: 1 day  Stress Test Type:  Stress  Reading MD: N/A  Order Authorizing Provider:  Mertie Moores, MD  Resting Radionuclide: Technetium 29m Sestamibi  Resting Radionuclide Dose: 11.0 mCi   Stress Radionuclide:  Technetium 80m Sestamibi  Stress Radionuclide Dose: 33.0 mCi           Stress Protocol Rest HR: 68 Stress HR: 150  Rest BP: 119/79 Stress BP: 159/69  Exercise Time (min): 7:30 METS: 9.30   Predicted Max HR: 162 bpm % Max HR: 92.59 bpm Rate Pressure Product: 23850   Dose of Adenosine (mg):  n/a Dose of Lexiscan: n/a mg  Dose of Atropine (mg): n/a Dose of Dobutamine: n/a mcg/kg/min (at max HR)  Stress Test Technologist: Perrin Maltese, EMT-P  Nuclear Technologist:  Vedia Pereyra, CNMT     Rest Procedure:  Myocardial perfusion imaging was performed at rest 45 minutes following the intravenous administration of Technetium 6m Sestamibi. Rest ECG: NSR - Normal EKG  Stress Procedure:  The patient exercised on the treadmill utilizing the Bruce Protocol for 7:30 minutes. The patient stopped due to sob, fatigue, and chest tightness(3/10).   Technetium 2m Sestamibi was injected at peak exercise and myocardial perfusion imaging was performed after a brief delay. Stress ECG: No significant change from baseline ECG  QPS Raw Data Images: Soft tissue (diaphragm, breast, bowel activity) surrounds heart.   Stress Images:  Normal homogeneous uptake in all areas of the myocardium. Rest Images:  Normal homogeneous uptake in all areas of the myocardium. Subtraction (SDS):  No evidence of ischemia. Transient Ischemic Dilatation (Normal <1.22):  1.40 Lung/Heart Ratio (Normal <0.45):  0.32  Quantitative Gated Spect Images QGS EDV:  78 ml QGS ESV:  38 ml  Impression Exercise Capacity:  Good exercise capacity. BP Response:  Normal blood pressure response. Clinical Symptoms:  Mild chest pain/dyspnea. ECG Impression:  No significant ST segment change suggestive of ischemia. Comparison with Prior Nuclear Study: no change from previous study.    Overall Impression:  Normal stress nuclear study.  LV Ejection Fraction: 51%.  LV Wall Motion:  Normal wall thickening.   By visual estimate, LVEF appears greater.    Dorris Carnes

## 2014-07-04 ENCOUNTER — Other Ambulatory Visit: Payer: Self-pay | Admitting: Cardiovascular Disease

## 2014-07-04 ENCOUNTER — Other Ambulatory Visit: Payer: Self-pay

## 2014-07-04 DIAGNOSIS — Z1231 Encounter for screening mammogram for malignant neoplasm of breast: Secondary | ICD-10-CM

## 2014-08-04 ENCOUNTER — Ambulatory Visit
Admission: RE | Admit: 2014-08-04 | Discharge: 2014-08-04 | Disposition: A | Discharge status: Home / Self Care | Payer: 59 | Source: Ambulatory Visit

## 2014-08-04 ENCOUNTER — Ambulatory Visit: Payer: PRIVATE HEALTH INSURANCE

## 2014-08-04 DIAGNOSIS — Z1231 Encounter for screening mammogram for malignant neoplasm of breast: Secondary | ICD-10-CM

## 2014-08-15 ENCOUNTER — Encounter: Payer: Self-pay | Admitting: Gastroenterology

## 2014-08-15 ENCOUNTER — Encounter: Payer: Self-pay | Admitting: Internal Medicine

## 2014-08-21 ENCOUNTER — Encounter: Payer: Self-pay | Admitting: Obstetrics and Gynecology

## 2014-08-21 ENCOUNTER — Ambulatory Visit (INDEPENDENT_AMBULATORY_CARE_PROVIDER_SITE_OTHER): Payer: PRIVATE HEALTH INSURANCE | Admitting: Obstetrics and Gynecology

## 2014-08-21 ENCOUNTER — Telehealth: Payer: Self-pay | Admitting: Obstetrics and Gynecology

## 2014-08-21 VITALS — BP 110/70 | HR 90 | Ht 66.0 in | Wt 160.2 lb

## 2014-08-21 DIAGNOSIS — N762 Acute vulvitis: Secondary | ICD-10-CM

## 2014-08-21 MED ORDER — FLUCONAZOLE 150 MG PO TABS: 150.0000 mg | ORAL_TABLET | Freq: Once | ORAL | Status: DC

## 2014-08-21 MED ORDER — ESTRADIOL 0.1 MG/GM VA CREA: TOPICAL_CREAM | VAGINAL | Status: DC

## 2014-08-21 NOTE — Telephone Encounter (Signed)
Patient c/o vaginal irritation and requests evaluation. Treatment in June by Regina Eck CNM x 2 for yeast. Scheduled office visit with Dr. Quincy Simmonds today at 1500. Patient agreeable.   Routing to provider for final review. Patient agreeable to disposition. Will close encounter

## 2014-08-21 NOTE — Telephone Encounter (Signed)
Pt has a yeast infection and would like to come in this afternoon if possible.

## 2014-08-21 NOTE — Patient Instructions (Signed)

## 2014-08-21 NOTE — Progress Notes (Signed)
Patient ID: Lauren Montes, female   DOB: Mar 20, 1956, 58 y.o.   MRN: [id number] GYNECOLOGY VISIT  PCP:   Domenick Gong, MD  Referring provider:   HPI: 58 y.o.   Married  Caucasian  female   G1P1001 with Patient's last menstrual period was 11/07/1980.   here for external vaginal irritation. "I think I have a yeast infection." Tried the Nystatin and gets temporary relief but it returns.  It feels like it is on fire.  Irritation is mostly external.  Some internal irritation as well.  No drainage.    Had no symptoms at all until one week ago.  Has been prespiring more. Took an abx 6 weeks ago.  No new exposures.  No chronic pad use.  GYNECOLOGIC HISTORY: Patient's last menstrual period was 11/07/1980. Sexually active: yes  Partner preference: female Contraception: hysterectomy   Menopausal hormone therapy: Climara 0.0375mg  DES exposure: no   Blood transfusions:  1975 after delivery of child  Sexually transmitted diseases: no   GYN procedures and prior surgeries:  Total abdominal hysterectomy/LSO, RSO in 2002, Rt. Breast mass removed over 20 years KGM:WNUUVO, breast reduction 10/2013. Last mammogram: 08-04-14 fibroglandular density/wnl:[facility]              Last pap and high risk HPV testing:    History of abnormal pap smear:     OB History   Grav Para Term Preterm Abortions TAB SAB Ect Mult Living   1 1 1       1        Patient Active Problem List   Diagnosis Date Noted  . Chronic diastolic CHF (congestive heart failure), NYHA class 2 06/20/2014  . Sinus tachycardia 09/14/2012  . Dysphagia 06/29/2012  . Ocular rosacea   . GERD (gastroesophageal reflux disease)   . Cough 09/09/2011  . Chest tightness, discomfort, or pressure 09/09/2011  . Hyperlipidemia 05/10/2011  . SWELLING OF LIMB 01/20/2011  . GERD 02/03/2010  . ABDOMINAL PAIN-EPIGASTRIC 02/03/2010  . ABDOMINAL PAIN-MULTIPLE SITES 02/03/2010  . CONSTIPATION 09/19/2008  . ABDOMINAL PAIN, LEFT LOWER  QUADRANT 09/19/2008  . COLONIC POLYPS, HYPERPLASTIC, HX OF 09/19/2008    Past Medical History  Diagnosis Date  . History of diastolic dysfunction   . Asthma   . History of migraine headaches   . Leg edema     left leg  . Tachycardia   . Cystic teratoma   . Elevated cholesterol   . Ocular rosacea   . GERD (gastroesophageal reflux disease)   . Endometriosis   . Blood transfusion without reported diagnosis 1975    with vaginal delivery--very anemic    Past Surgical History  Procedure Laterality Date  . Foot surgery      LEFT FOOT  . Cardiovascular stress test  07/02/2008    EF 55-60%  . Appendectomy    . Bunionectomy      bilateral  . Left breast mass removed    . Rso  2002    partial  . Dental surgery  2007  . Ruptured disc surgery  07,08  . Back surgery    . Oophorectomy  1982    RSO partial and LSO, uterus  . Total abdominal hysterectomy    . Breast surgery  10-09-13    breast reduction--Dr. Dessie Coma    Current Outpatient Prescriptions  Medication Sig Dispense Refill  . calcium-vitamin D (OSCAL WITH D) 500-200 MG-UNIT per tablet Take 1 tablet by mouth daily.       . cetirizine (  ZYRTEC) 10 MG tablet Take 10 mg by mouth daily.      . chlorpheniramine (CHLOR-TRIMETON) 4 MG tablet Take 4 mg by mouth as needed for allergies.      Marland Kitchen CRESTOR 10 MG tablet TAKE 1 TABLET (10 MG TOTAL) BY MOUTH DAILY.  30 tablet  1  . cyclobenzaprine (FLEXERIL) 10 MG tablet Take 10 mg by mouth as needed.       . cycloSPORINE (RESTASIS) 0.05 % ophthalmic emulsion Place 1 drop into both eyes 2 (two) times daily.      Marland Kitchen diltiazem (CARDIZEM CD) 120 MG 24 hr capsule Take 1 capsule (120 mg total) by mouth daily.  90 capsule  3  . estradiol (CLIMARA - DOSED IN MG/24 HR) 0.0375 mg/24hr patch Place 1 patch (0.0375 mg total) onto the skin once a week.  12 patch  3  . fluticasone-salmeterol (ADVAIR HFA) 115-21 MCG/ACT inhaler Inhale 2 puffs into the lungs 2 (two) times daily.      . furosemide (LASIX)  20 MG tablet Take 20 mg by mouth daily.      Marland Kitchen levalbuterol (XOPENEX HFA) 45 MCG/ACT inhaler Inhale 1-2 puffs into the lungs every 4 (four) hours as needed.        Marland Kitchen LYRICA 50 MG capsule Take 50 mg by mouth daily.      . methocarbamol (ROBAXIN) 500 MG tablet Take 500 mg by mouth as needed.      . montelukast (SINGULAIR) 10 MG tablet Take 10 mg by mouth at bedtime.        . nabumetone (RELAFEN) 500 MG tablet Take 500 mg by mouth as needed.      . nystatin-triamcinolone (MYCOLOG II) cream Apply 1 application topically 2 (two) times daily. Apply to affected area BID for up to 7 days.  60 g  0  . Omega-3 Fatty Acids (OMEGA 3 PO) Take 1 capsule by mouth daily.        Earney Navy Bicarbonate (ZEGERID) 20-1100 MG CAPS Take 1 capsule by mouth 2 (two) times daily.  14 each  0  . QNASL 80 MCG/ACT AERS Place 80 % into the nose.       No current facility-administered medications for this visit.     ALLERGIES: Lipitor and Codeine  Family History  Problem Relation Age of Onset  . Coronary artery disease Father   . Hypertension Father   . Heart disease Father   . Asthma Mother   . Hypertension Mother   . Alzheimer's disease Maternal Grandmother   . Lung cancer Maternal Grandfather   . Heart failure Paternal Grandfather   . Colon cancer Neg Hx   . Colon polyps Neg Hx   . Rectal cancer Neg Hx   . Stomach cancer Neg Hx   . Skin cancer Sister   . Hepatitis C Brother     History   Social History  . Marital Status: Married    Spouse Name: N/A    Number of Children: 1  . Years of Education: N/A   Occupational History  . retired    Social History Main Topics  . Smoking status: Never Smoker   . Smokeless tobacco: Never Used  . Alcohol Use: 4.2 oz/week    7 Glasses of wine per week  . Drug Use: No  . Sexual Activity: Yes    Partners: Male    Birth Control/ Protection: Surgical     Comment: Hyst   Other Topics Concern  . Not on file  Social History Narrative  . No narrative  on file    ROS:  Pertinent items are noted in HPI.  PHYSICAL EXAMINATION:    BP 110/70  Pulse 90  Ht 5\' 6"  (1.676 m)  Wt 160 lb 3.2 oz (72.666 kg)  BMI 25.87 kg/m2  LMP 11/07/1980   Wt Readings from Last 3 Encounters:  08/21/14 160 lb 3.2 oz (72.666 kg)  06/26/14 153 lb (69.4 kg)  06/20/14 152 lb 9.6 oz (69.219 kg)     Ht Readings from Last 3 Encounters:  08/21/14 5\' 6"  (1.676 m)  06/26/14 5\' 6"  (1.676 m)  06/20/14 5\' 6"  (1.676 m)    General appearance: alert, cooperative and appears stated age   Pelvic: External genitalia: mild erythema of the bilateral labia majora.              Urethra:  normal appearing urethra with no masses, tenderness or lesions              Bartholins and Skenes: normal                 Vagina: normal appearing vagina with normal color and discharge, no lesions              Cervix: absent                 Bimanual Exam:  Uterus:  absent                                      Adnexa: no masses  Wet prep - pH 4.5, negative yeast, clue cells, and trichomonas.                                    ASSESSMENT  Vulvitis.  Possible candida. 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 ERT patient.   PLAN  Diflucan 150 mg po.  See orders.  Estrace vaginal cream.  See orders.  Instructed in use - side effects - benefits/risks. Return prn.   15 minutes face to face time of which over 50% was spent in counseling.   An After Visit Summary was printed and given to the patient.

## 2014-08-25 ENCOUNTER — Encounter: Payer: Self-pay | Admitting: Obstetrics and Gynecology

## 2014-09-02 ENCOUNTER — Telehealth: Payer: Self-pay | Admitting: Obstetrics and Gynecology

## 2014-09-02 NOTE — Telephone Encounter (Signed)
Spoke with patient. Patient states that she called in again to schedule an appointment for tomorrow with Dr.Silva. Appointment has been scheduled with the front for tomorrow 10/28 at 3:30pm with Dr.Silva. "I am not desperate but want to come in to see her tomorrow."  Routing to provider for final review. Patient agreeable to disposition. Will close encounter

## 2014-09-02 NOTE — Telephone Encounter (Signed)
Left message to call Jazzmyne Rasnick at [phone]. 

## 2014-09-02 NOTE — Telephone Encounter (Signed)
Patient was treated recently for a yeast infection and it has not cleared up.

## 2014-09-03 ENCOUNTER — Encounter: Payer: Self-pay | Admitting: Obstetrics and Gynecology

## 2014-09-03 ENCOUNTER — Ambulatory Visit (INDEPENDENT_AMBULATORY_CARE_PROVIDER_SITE_OTHER): Payer: PRIVATE HEALTH INSURANCE | Admitting: Obstetrics and Gynecology

## 2014-09-03 VITALS — BP 120/74 | HR 80 | Ht 66.0 in | Wt 159.2 lb

## 2014-09-03 DIAGNOSIS — N952 Postmenopausal atrophic vaginitis: Secondary | ICD-10-CM

## 2014-09-03 DIAGNOSIS — R300 Dysuria: Secondary | ICD-10-CM

## 2014-09-03 LAB — POCT URINALYSIS DIPSTICK
Bilirubin, UA: NEGATIVE
Blood, UA: NEGATIVE
GLUCOSE UA: NEGATIVE
Ketones, UA: NEGATIVE
LEUKOCYTES UA: NEGATIVE
NITRITE UA: NEGATIVE
Protein, UA: NEGATIVE
Urobilinogen, UA: NEGATIVE
pH, UA: 5

## 2014-09-03 MED ORDER — ESTRADIOL 10 MCG VA TABS: ORAL_TABLET | VAGINAL | Status: DC

## 2014-09-03 NOTE — Progress Notes (Signed)
Patient ID: Lauren Montes, female   DOB: 1956-07-11, 58 y.o.   MRN: [id number] GYNECOLOGY VISIT  PCP:  Domenick Gong, MD  Referring provider:   HPI: 58 y.o.   Married  Caucasian  female   G1P1001 with Patient's last menstrual period was 11/07/1980.   here for  Dysuria and vaginal discharge.  Treated with Diflucan recently and felt well.  Started Estrace estrogen cream 1/2 gram and felt better and then started itching shortly after. Took some Nystatin last night and that really helped resolve her symptoms. Lower back pressure. Has a lots of back problems. 2 ruptured discs and arthritis.   Urine:  Neg  GYNECOLOGIC HISTORY: Patient's last menstrual period was 11/07/1980. Sexually active:  yes Partner preference: female Contraception:  hysterectomy  Menopausal hormone therapy: Climara 0.0375mg  DES exposure:  no  Blood transfusions:   1975 after childbirth Sexually transmitted diseases:   no GYN procedures and prior surgeries:TAH/LSO, RSO in 2002, Rt. Breast mass removed 20 years ago--benign, breast reduction 10/2013.  Last mammogram: 08-04-14 fibroglandular density:[facility]                Last pap and high risk HPV testing: 02-07-11 wnl:neg HR HPV   History of abnormal pap smear:  no   OB History   Grav Para Term Preterm Abortions TAB SAB Ect Mult Living   1 1 1       1        LIFESTYLE: Exercise:               Tobacco:  Alcohol: Drug use:    Patient Active Problem List   Diagnosis Date Noted  . Chronic diastolic CHF (congestive heart failure), NYHA class 2 06/20/2014  . Sinus tachycardia 09/14/2012  . Dysphagia 06/29/2012  . Ocular rosacea   . GERD (gastroesophageal reflux disease)   . Cough 09/09/2011  . Chest tightness, discomfort, or pressure 09/09/2011  . Hyperlipidemia 05/10/2011  . SWELLING OF LIMB 01/20/2011  . GERD 02/03/2010  . ABDOMINAL PAIN-EPIGASTRIC 02/03/2010  . ABDOMINAL PAIN-MULTIPLE SITES 02/03/2010  . CONSTIPATION 09/19/2008  .  ABDOMINAL PAIN, LEFT LOWER QUADRANT 09/19/2008  . COLONIC POLYPS, HYPERPLASTIC, HX OF 09/19/2008    Past Medical History  Diagnosis Date  . History of diastolic dysfunction   . Asthma   . History of migraine headaches   . Leg edema     left leg  . Tachycardia   . Cystic teratoma   . Elevated cholesterol   . Ocular rosacea   . GERD (gastroesophageal reflux disease)   . Endometriosis   . Blood transfusion without reported diagnosis 1975    with vaginal delivery--very anemic    Past Surgical History  Procedure Laterality Date  . Foot surgery      LEFT FOOT  . Cardiovascular stress test  07/02/2008    EF 55-60%  . Appendectomy    . Bunionectomy      bilateral  . Left breast mass removed    . Rso  2002    partial  . Dental surgery  2007  . Ruptured disc surgery  07,08  . Back surgery    . Oophorectomy  1982    RSO partial and LSO, uterus  . Total abdominal hysterectomy    . Breast surgery  10-09-13    breast reduction--Dr. Dessie Coma    Current Outpatient Prescriptions  Medication Sig Dispense Refill  . calcium-vitamin D (OSCAL WITH D) 500-200 MG-UNIT per tablet Take 1 tablet by mouth daily.       Marland Kitchen  cetirizine (ZYRTEC) 10 MG tablet Take 10 mg by mouth daily.      . chlorpheniramine (CHLOR-TRIMETON) 4 MG tablet Take 4 mg by mouth as needed for allergies.      Marland Kitchen CRESTOR 10 MG tablet TAKE 1 TABLET (10 MG TOTAL) BY MOUTH DAILY.  30 tablet  1  . cyclobenzaprine (FLEXERIL) 10 MG tablet Take 10 mg by mouth as needed.       . cycloSPORINE (RESTASIS) 0.05 % ophthalmic emulsion Place 1 drop into both eyes 2 (two) times daily.      Marland Kitchen diltiazem (CARDIZEM CD) 120 MG 24 hr capsule Take 1 capsule (120 mg total) by mouth daily.  90 capsule  3  . estradiol (CLIMARA - DOSED IN MG/24 HR) 0.0375 mg/24hr patch Place 1 patch (0.0375 mg total) onto the skin once a week.  12 patch  3  . estradiol (ESTRACE) 0.1 MG/GM vaginal cream Use 1/2 g vaginally every night for the first 2 weeks, then use  1/2 g vaginally two times a week.  42.5 g  1  . fluticasone-salmeterol (ADVAIR HFA) 115-21 MCG/ACT inhaler Inhale 2 puffs into the lungs 2 (two) times daily.      . furosemide (LASIX) 20 MG tablet Take 20 mg by mouth daily.      Marland Kitchen levalbuterol (XOPENEX HFA) 45 MCG/ACT inhaler Inhale 1-2 puffs into the lungs every 4 (four) hours as needed.        Marland Kitchen LYRICA 50 MG capsule Take 50 mg by mouth daily.      . montelukast (SINGULAIR) 10 MG tablet Take 10 mg by mouth at bedtime.        . nabumetone (RELAFEN) 500 MG tablet Take 500 mg by mouth as needed.      . nystatin-triamcinolone (MYCOLOG II) cream Apply 1 application topically 2 (two) times daily. Apply to affected area BID for up to 7 days.  60 g  0  . Omega-3 Fatty Acids (OMEGA 3 PO) Take 1 capsule by mouth daily.        Earney Navy Bicarbonate (ZEGERID) 20-1100 MG CAPS Take 1 capsule by mouth 2 (two) times daily.  14 each  0  . QNASL 80 MCG/ACT AERS Place 80 % into the nose.      . zolpidem (AMBIEN) 10 MG tablet Take 10 mg by mouth as needed.       No current facility-administered medications for this visit.     ALLERGIES: Lipitor and Codeine  Family History  Problem Relation Age of Onset  . Coronary artery disease Father   . Hypertension Father   . Heart disease Father   . Asthma Mother   . Hypertension Mother   . Alzheimer's disease Maternal Grandmother   . Lung cancer Maternal Grandfather   . Heart failure Paternal Grandfather   . Colon cancer Neg Hx   . Colon polyps Neg Hx   . Rectal cancer Neg Hx   . Stomach cancer Neg Hx   . Skin cancer Sister   . Hepatitis C Brother     History   Social History  . Marital Status: Married    Spouse Name: N/A    Number of Children: 1  . Years of Education: N/A   Occupational History  . retired    Social History Main Topics  . Smoking status: Never Smoker   . Smokeless tobacco: Never Used  . Alcohol Use: 4.2 oz/week    7 Glasses of wine per week  . Drug Use:  No  . Sexual  Activity: Yes    Partners: Male    Birth Control/ Protection: Surgical     Comment: Hyst   Other Topics Concern  . Not on file   Social History Narrative  . No narrative on file    ROS:  Pertinent items are noted in HPI.  PHYSICAL EXAMINATION:    BP 120/74  Pulse 80  Ht 5\' 6"  (1.676 m)  Wt 159 lb 3.2 oz (72.213 kg)  BMI 25.71 kg/m2  LMP 11/07/1980   Wt Readings from Last 3 Encounters:  09/03/14 159 lb 3.2 oz (72.213 kg)  08/21/14 160 lb 3.2 oz (72.666 kg)  06/26/14 153 lb (69.4 kg)     Ht Readings from Last 3 Encounters:  09/03/14 5\' 6"  (1.676 m)  08/21/14 5\' 6"  (1.676 m)  06/26/14 5\' 6"  (1.676 m)    General appearance: alert, cooperative and appears stated age  Pelvic: External genitalia:  no lesions              Urethra:  normal appearing urethra with no masses, tenderness or lesions              Bartholins and Skenes: normal                 Vagina: normal appearing vagina with normal color and discharge, no lesions.  Essentially no discharge.              Cervix: normal appearance                 Bimanual Exam:  Uterus:  uterus is normal size, shape, consistency and nontender                                      Adnexa: normal adnexa in size, nontender and no masses                                     Wet prep - pH 5.5, negative yeast, clue cells, trichomonas.  ASSESSMENT  Subclinical vaginal yeast infection. Atrophic vaginitis.   PLAN  Use Nystatin for 2 more nights.  Stop vaginal Estrace cream.  Switch to Vagifem after complete Nystatin treatment.  Follow up prn.   An After Visit Summary was printed and given to the patient.  15 minutes face to face time of which over 50% was spent in counseling.

## 2014-09-08 ENCOUNTER — Encounter: Payer: Self-pay | Admitting: Obstetrics and Gynecology

## 2014-09-09 ENCOUNTER — Encounter: Payer: Self-pay | Admitting: Obstetrics and Gynecology

## 2014-09-19 NOTE — Telephone Encounter (Signed)
Pt would like to come in and discuss bladder procedure with Dr Quincy Simmonds.

## 2014-09-23 ENCOUNTER — Encounter: Payer: Self-pay | Admitting: Cardiovascular Disease

## 2014-09-23 ENCOUNTER — Ambulatory Visit (INDEPENDENT_AMBULATORY_CARE_PROVIDER_SITE_OTHER): Payer: 59 | Admitting: Cardiovascular Disease

## 2014-09-23 VITALS — BP 118/90 | HR 77 | Ht 66.0 in | Wt 159.1 lb

## 2014-09-23 DIAGNOSIS — I5032 Chronic diastolic (congestive) heart failure: Secondary | ICD-10-CM

## 2014-09-23 NOTE — Patient Instructions (Signed)
Your physician recommends that you continue on your current medications as directed. Please refer to the Current Medication list given to you today.  Your physician wants you to follow-up in: 1 year with Dr. Nahser.  You will receive a reminder letter in the mail two months in advance. If you don't receive a letter, please call our office to schedule the follow-up appointment.  

## 2014-09-23 NOTE — Assessment & Plan Note (Signed)
Lauren Montes is doing well. Continue with current dose of Diltiazem 120 a day I will see her in 1 year. Sooner if neede.d

## 2014-09-23 NOTE — Progress Notes (Signed)
Lauren Montes Date of Birth  24-Mar-1956 [facility] [address]     [place],   [address] [phone]  Fax  [phone]  1. Hyperlipidemia 2. Sinus tachycardia 3. Chronic asthma  History of Present Illness:  Lauren Montes is a middle-age female the history of mild diastolic dysfunction as well as leg edema. She also has a history of hypercholesterolemia.   Has a history of hyperlipidemia. We have had her on Crestor the past which she tolerated very well. Her insurance and he refused to pay for to try her on atorvastatin. This caused her to have lots of muscle aches as well as memory loss. We discontinued the atorvastatin at that time.  She has been recording her heart rate and blood pressure readings. Her heart rate is frequently above 100. He also has many readings in the 80-90 range.  Has bruising particularly on her hands and on her fingers. [name] never had any easy bleeding. She did not have any bleeding  complications. during her pregnancies or deliveries.  Feb. 4, 2014: Lauren Montes is doing well.  Her HR and BP have been well controlled.   June 17, 2013:  Lauren Montes is doing well.  She has started waking over the past several weeks.    June 21, 2014:  Lauren Montes is having some issues with DOE.    Perhaps slight chest pressure with exertion.  No pain. .  Sleeps on 2 pillows occasionally at night - not every night.  [name] very careful with her salt intake. She and her husband thinks most of her meals of scratch. She denies any leg swelling   Nov. 17, 2015:  Lauren Montes is doing well.  She is seen for chronic dyspnea. Her echo was normal.  myoview showed no ischemia Occasional heart racing.  Not severe.  Does not take any meds.  Resolves after several minutes. She is walking for exercise - 30-45 minutes 3 times a week.  Also do some strength training    Current Outpatient Prescriptions  Medication Sig Dispense Refill  .  calcium-vitamin D (OSCAL WITH D) 500-200 MG-UNIT per tablet Take 1 tablet by mouth daily.     . cetirizine (ZYRTEC) 10 MG tablet Take 10 mg by mouth daily.    . chlorpheniramine (CHLOR-TRIMETON) 4 MG tablet Take 4 mg by mouth as needed for allergies.    Marland Kitchen CRESTOR 10 MG tablet TAKE 1 TABLET (10 MG TOTAL) BY MOUTH DAILY. 30 tablet 1  . cyclobenzaprine (FLEXERIL) 10 MG tablet Take 10 mg by mouth as needed.     . cycloSPORINE (RESTASIS) 0.05 % ophthalmic emulsion Place 1 drop into both eyes 2 (two) times daily.    Marland Kitchen diltiazem (CARDIZEM CD) 120 MG 24 hr capsule Take 1 capsule (120 mg total) by mouth daily. 90 capsule 3  . estradiol (CLIMARA - DOSED IN MG/24 HR) 0.0375 mg/24hr patch Place 1 patch (0.0375 mg total) onto the skin once a week. 12 patch 3  . Estradiol 10 MCG TABS vaginal tablet Use 1 tablet in the vagina nightly for two weeks and then one tablet in vagina twice weekly. 18 tablet 5  . fluticasone-salmeterol (ADVAIR HFA) 115-21 MCG/ACT inhaler Inhale 2 puffs into the lungs 2 (two) times daily.    . furosemide (LASIX) 20 MG tablet Take 20 mg by mouth daily.    Marland Kitchen levalbuterol (XOPENEX HFA) 45 MCG/ACT inhaler Inhale 1-2 puffs into the lungs every 4 (four) hours as needed.      Marland Kitchen  LYRICA 50 MG capsule Take 50 mg by mouth daily.    . montelukast (SINGULAIR) 10 MG tablet Take 10 mg by mouth at bedtime.      . nabumetone (RELAFEN) 500 MG tablet Take 500 mg by mouth as needed.    . Omega-3 Fatty Acids (OMEGA 3 PO) Take 1 capsule by mouth daily.      Earney Navy Bicarbonate (ZEGERID) 20-1100 MG CAPS Take 1 capsule by mouth 2 (two) times daily. 14 each 0  . QNASL 80 MCG/ACT AERS Place 80 % into the nose.    . zolpidem (AMBIEN) 10 MG tablet Take 10 mg by mouth as needed.    . nystatin-triamcinolone (MYCOLOG II) cream Apply 1 application topically 2 (two) times daily. Apply to affected area BID for up to 7 days. 60 g 0   No current facility-administered medications for this visit.      Allergies  Allergen Reactions  . Lipitor [Atorvastatin] Other (See Comments)    Muscle ache, mind foggy,   . Codeine     Past Medical History  Diagnosis Date  . History of diastolic dysfunction   . Asthma   . History of migraine headaches   . Leg edema     left leg  . Tachycardia   . Cystic teratoma   . Elevated cholesterol   . Ocular rosacea   . GERD (gastroesophageal reflux disease)   . Endometriosis   . Blood transfusion without reported diagnosis 1975    with vaginal delivery--very anemic    Past Surgical History  Procedure Laterality Date  . Foot surgery      LEFT FOOT  . Cardiovascular stress test  07/02/2008    EF 55-60%  . Appendectomy    . Bunionectomy      bilateral  . Left breast mass removed    . Rso  2002    partial  . Dental surgery  2007  . Ruptured disc surgery  07,08  . Back surgery    . Oophorectomy  1982    RSO partial and LSO, uterus  . Total abdominal hysterectomy    . Breast surgery  10-09-13    breast reduction--Dr. Holderness    History  Smoking status  . Never Smoker   Smokeless tobacco  . Never Used    History  Alcohol Use  . 4.2 oz/week  . 7 Glasses of wine per week    Family History  Problem Relation Age of Onset  . Coronary artery disease Father   . Hypertension Father   . Heart disease Father   . Asthma Mother   . Hypertension Mother   . Alzheimer's disease Maternal Grandmother   . Lung cancer Maternal Grandfather   . Heart failure Paternal Grandfather   . Colon cancer Neg Hx   . Colon polyps Neg Hx   . Rectal cancer Neg Hx   . Stomach cancer Neg Hx   . Skin cancer Sister   . Hepatitis C Brother     Reviw of Systems:  Reviewed in the HPI.  All other systems are negative.  Physical Exam: BP 118/90 mmHg  Pulse 77  Ht 5\' 6"  (1.676 m)  Wt 159 lb 1.9 oz (72.176 kg)  BMI 25.69 kg/m2  LMP 11/07/1980 The patient is alert and oriented x 3.  The mood and affect are normal.   Skin: warm and dry.  Color is  normal.    HEENT:   the sclera are nonicteric.  The mucous membranes  are moist.  The carotids are 2+ without bruits.  There is no thyromegaly.  There is no JVD.    Lungs: clear.  The chest wall is non tender.    Heart: regular rate with a normal S1 and S2.  There are no murmurs, gallops, or rubs. The PMI is not displaced.     Abdomin: good bowel sounds.  There is no guarding or rebound.  There is no hepatosplenomegaly or tenderness.  There are no masses.   Extremities:  Trace edema.  The legs are without rashes.  The distal pulses are intact.   Neuro:  Cranial nerves II - XII are intact.  Motor and sensory functions are intact.    The gait is normal.  ECG: June 21, 2014:    Normal sinus rhythm 82 beats a minute. She has no ST or T wave changes. She has right axis deviation. Assessment / Plan:

## 2014-09-24 NOTE — Telephone Encounter (Signed)
Spoke with patient. She would like a consult to discuss surgical options with Dr. Quincy Simmonds.  Offered first available consult with Dr. Quincy Simmonds 10/24/14. Patient would like to discuss surgical options and would like to plan surgery prior to the end of the year. If she cannot have surgery prior to the end of the year, she will hold off on consult until after holidays and moving.  Advised patient that the time line she is giving is very short and it would not be likely that patient could have consult and then surgery within 2 weeks.  Patient agreeable to this. She states she will wait at this time and call for consult after the holidays and after she is settled in for her move.  Routing to Dr. Quincy Simmonds to review and okay to close?

## 2014-09-25 ENCOUNTER — Other Ambulatory Visit: Payer: Self-pay | Admitting: Cardiovascular Disease

## 2014-09-25 NOTE — Telephone Encounter (Signed)
I agree with what you told the patient.  We will need to plan for surgery next year.  I will close the encounter.

## 2014-12-12 ENCOUNTER — Encounter: Payer: Self-pay | Admitting: Nurse Practitioner

## 2014-12-12 ENCOUNTER — Ambulatory Visit (INDEPENDENT_AMBULATORY_CARE_PROVIDER_SITE_OTHER): Payer: 59 | Admitting: Nurse Practitioner

## 2014-12-12 VITALS — BP 102/66 | HR 88 | Ht 66.0 in | Wt 164.0 lb

## 2014-12-12 DIAGNOSIS — N760 Acute vaginitis: Secondary | ICD-10-CM

## 2014-12-12 MED ORDER — FLUCONAZOLE 150 MG PO TABS: ORAL_TABLET | ORAL | Status: DC

## 2014-12-12 NOTE — Patient Instructions (Signed)

## 2014-12-12 NOTE — Progress Notes (Signed)
59 y.o. MW Fe G1P1 here with complaint of vaginal symptoms with itching, burning, and increase discharge. Describes discharge sometimes white. Onset of symptoms for several days following SA days ago.  Denies new personal products.  She does have ongoing problem with vaginal dryness.  She is currently on Vagifem prn for atrophy.  She does not use this very often as both the Vagifem and Estrace caused yeast vaginitis. Even with lubrication she has dyspareunia.  She remains on Climara patch. No STD concerns. Urinary symptoms none .   O:Healthy female WDWN Affect: normal, orientation x 3  Exam: no distress   Abdomen: soft and non tender Lymph node: no enlargement or tenderness Pelvic exam: External genital: atrophic changes without lesions BUS: negative Vagina: thin clear discharge noted.  Affirm test is done   A: Atrophic vaginitis  R/O yeast and / or BV     P: Discussed findings of possible yeast/ BV and etiology. Discussed Aveeno or baking soda sitz bath for comfort. Avoid moist clothes or pads for extended period of time. If working out in gym clothes or swim suits for long periods of time change underwear or bottoms of swimsuit if possible. Olive Oil use for skin protection prior to activity can be used to external skin.  Rx: she is to start back on Estrace vaginal cream even if at a lower amount and try to use at HS three times weekly initially.  She is given RX for Diflucan 150 mg weekly X 4 to help her with symptoms while she restarts the vaginal estrogen.  She is counseled with use of vaginal estrogen and risk of CVA, DVT, cancer,etc.  Will call her with the Affirm testing  RV prn

## 2014-12-13 LAB — WET PREP BY MOLECULAR PROBE
CANDIDA SPECIES: NEGATIVE
GARDNERELLA VAGINALIS: NEGATIVE
TRICHOMONAS VAG: NEGATIVE

## 2014-12-14 NOTE — Progress Notes (Signed)
Encounter reviewed by Dr. Deshawn Witty Silva.  

## 2014-12-26 NOTE — Telephone Encounter (Signed)
none

## 2015-02-09 ENCOUNTER — Ambulatory Visit (INDEPENDENT_AMBULATORY_CARE_PROVIDER_SITE_OTHER): Payer: BLUE CROSS/BLUE SHIELD | Admitting: Nurse Practitioner

## 2015-02-09 ENCOUNTER — Encounter: Payer: Self-pay | Admitting: Nurse Practitioner

## 2015-02-09 ENCOUNTER — Other Ambulatory Visit: Payer: Self-pay | Admitting: *Deleted

## 2015-02-09 VITALS — BP 130/82 | HR 72 | Ht 66.0 in | Wt 156.0 lb

## 2015-02-09 DIAGNOSIS — B3731 Acute candidiasis of vulva and vagina: Secondary | ICD-10-CM

## 2015-02-09 DIAGNOSIS — B373 Candidiasis of vulva and vagina: Secondary | ICD-10-CM

## 2015-02-09 MED ORDER — TERCONAZOLE 0.4 % VA CREA: 1.0000 | TOPICAL_CREAM | Freq: Every day | VAGINAL | Status: DC

## 2015-02-09 NOTE — Patient Instructions (Signed)

## 2015-02-09 NOTE — Telephone Encounter (Signed)
A user error has taken place: encounter opened in error, closed for administrative reasons.

## 2015-02-09 NOTE — Progress Notes (Signed)
59 y.o.MW Fe G1P1 here with complaint of vaginal symptoms of itching, burning, and increase discharge. Describes discharge as white thick at times Onset of symptoms 4-5  days ago. Denies new personal products.  She has vaginal dryness but uses Vagifem twice weekly and coconut oil prn.  She has taken Diflucan X 1.  For this chronic problem she has also used Diflucan X 4 for long term treatment.  No STD concerns. Denies Urinary symptoms.  She did use Monistat cream intravaginally X 2 nights.   O:Healthy female WDWN Affect: normal, orientation x 3  Exam: no acute distress Abdomen: soft and non tender Lymph node: no enlargement or tenderness Pelvic exam: External genital: normal female BUS: negative Vagina: white discharge noted. This could all be from the Monistat. Affirm is taken from the sidewalls. Cervix: absent  Adnexa:normal, non tender, no masses or fullness noted.  No tenderness of urethra.  Affirm test is done   A:  Normal pelvic exam  Yeast vaginitis ?  P: Discussed findings of possible vaginitis and etiology. Discussed Aveeno or baking soda sitz bath for comfort. Avoid moist clothes or pads for extended period of time. If working out in gym clothes or swim suits for long periods of time change underwear or bottoms of swimsuit if possible. Olive Oil/Coconut Oil use for skin protection prior to activity can be used to external skin.  Rx: since history of chronic yeast - will change treatment to Terazol HS X 7.  Will  follow with Affirm test and treatment as indicated.  RV prn

## 2015-02-10 ENCOUNTER — Encounter: Payer: Self-pay | Admitting: Nurse Practitioner

## 2015-02-10 ENCOUNTER — Other Ambulatory Visit: Payer: Self-pay | Admitting: Nurse Practitioner

## 2015-02-10 LAB — WET PREP BY MOLECULAR PROBE
CANDIDA SPECIES: NEGATIVE
Gardnerella vaginalis: POSITIVE — AB
Trichomonas vaginosis: NEGATIVE

## 2015-02-10 MED ORDER — METRONIDAZOLE 0.75 % VA GEL: 1.0000 | Freq: Every day | VAGINAL | Status: DC

## 2015-02-10 NOTE — Addendum Note (Signed)
Addended by: Antonietta Barcelona on: 02/10/2015 08:29 AM   Modules accepted: Level of Service

## 2015-02-12 NOTE — Progress Notes (Signed)
Encounter reviewed by Dr. Brook Silva.  

## 2015-02-22 ENCOUNTER — Encounter: Payer: Self-pay | Admitting: Nurse Practitioner

## 2015-02-23 ENCOUNTER — Other Ambulatory Visit: Payer: Self-pay | Admitting: Nurse Practitioner

## 2015-02-23 ENCOUNTER — Encounter: Payer: Self-pay | Admitting: Certified Nurse Midwife

## 2015-02-23 ENCOUNTER — Ambulatory Visit (INDEPENDENT_AMBULATORY_CARE_PROVIDER_SITE_OTHER): Payer: BLUE CROSS/BLUE SHIELD | Admitting: Certified Nurse Midwife

## 2015-02-23 VITALS — BP 110/74 | HR 70 | Temp 98.6°F | Resp 16 | Ht 66.0 in | Wt 155.0 lb

## 2015-02-23 DIAGNOSIS — R350 Frequency of micturition: Secondary | ICD-10-CM | POA: Diagnosis not present

## 2015-02-23 DIAGNOSIS — N390 Urinary tract infection, site not specified: Secondary | ICD-10-CM | POA: Diagnosis not present

## 2015-02-23 DIAGNOSIS — N898 Other specified noninflammatory disorders of vagina: Secondary | ICD-10-CM

## 2015-02-23 LAB — POCT URINALYSIS DIPSTICK
Blood, UA: NEGATIVE
Glucose, UA: NEGATIVE
Ketones, UA: NEGATIVE
LEUKOCYTES UA: NEGATIVE
Nitrite, UA: NEGATIVE
Protein, UA: NEGATIVE
Urobilinogen, UA: NEGATIVE
pH, UA: 5

## 2015-02-23 MED ORDER — TERCONAZOLE 0.4 % VA CREA: 1.0000 | TOPICAL_CREAM | Freq: Every day | VAGINAL | Status: DC

## 2015-02-23 NOTE — Progress Notes (Signed)
59 y.o. married g1p1001 here with complaint of ?UTI, with onset   24 hours ago.. Patient complaining of urinary frequency and very slight pain with urination. Patient denies fever, chills, nausea.Very sight lower  back pain with onset 24 hours ago. No new personal products.Patient had been seen 2 weeks ago here by P.Grubb, treated for yeast and BV, Completed Terazol 7 and now on second day of Metrogel.. Some mild irritation vaginal symptoms, but itching has subsided.   Menopausal on HRT with vaginal dryness using Coconut oil. Patient drinking adequate water intake. Patient just wanted to make sure no other issues.   O: Healthy female WDWN Affect: Normal, orientation x 3 Skin : warm and dry CVAT: negative bilateral Abdomen: negative for suprapubic tenderness  Pelvic exam: External genital area: normal, no lesions Bladder,Urethra non tender, Urethral meatus: not tender,slight increase pink Vagina: clear medicated vaginal discharge noted, normal appearance  Wet prep not taken Cervix: normal, non tender Uterus:normal,non tender Adnexa: normal non tender, no fullness or masses   A: Normal pelvic exam Yeast vaginitis completed treatment BV under treatment R/O UTI suspect irritation from BV and Metrogel use Vaginal dryness using Coconut oil  Poct urine-bilirubin 2+, but Metrogel in with urine  P: Reviewed findings of normal pelvic exam. Reviewed normal vaginal findings which appear to be clearing symptoms. Discussed Aveeno bath to help soothing tissue and clearing old discharge. Patient plans to try. Complete Metrogel as directed. Reviewed warning signs of UTI and to call if status changes. XJD:BZMCE micro, culture Encouraged to limit soda, tea, and coffee. Return to coconut oil only when all medications if completed and no other symptoms. Patient agreeable.    RV prn

## 2015-02-23 NOTE — Patient Instructions (Signed)
Bacterial Vaginosis Bacterial vaginosis is a vaginal infection that occurs when the normal balance of bacteria in the vagina is disrupted. It results from an overgrowth of certain bacteria. This is the most common vaginal infection in women of childbearing age. Treatment is important to prevent complications, especially in pregnant women, as it can cause a premature delivery. CAUSES  Bacterial vaginosis is caused by an increase in harmful bacteria that are normally present in smaller amounts in the vagina. Several different kinds of bacteria can cause bacterial vaginosis. However, the reason that the condition develops is not fully understood. RISK FACTORS Certain activities or behaviors can put you at an increased risk of developing bacterial vaginosis, including:  Having a new sex partner or multiple sex partners.  Douching.  Using an intrauterine device (IUD) for contraception. Women do not get bacterial vaginosis from toilet seats, bedding, swimming pools, or contact with objects around them. SIGNS AND SYMPTOMS  Some women with bacterial vaginosis have no signs or symptoms. Common symptoms include:  Grey vaginal discharge.  A fishlike odor with discharge, especially after sexual intercourse.  Itching or burning of the vagina and vulva.  Burning or pain with urination. DIAGNOSIS  Your health care provider will take a medical history and examine the vagina for signs of bacterial vaginosis. A sample of vaginal fluid may be taken. Your health care provider will look at this sample under a microscope to check for bacteria and abnormal cells. A vaginal pH test may also be done.  TREATMENT  Bacterial vaginosis may be treated with antibiotic medicines. These may be given in the form of a pill or a vaginal cream. A second round of antibiotics may be prescribed if the condition comes back after treatment.  HOME CARE INSTRUCTIONS   Only take over-the-counter or prescription medicines as  directed by your health care provider.  If antibiotic medicine was prescribed, take it as directed. Make sure you finish it even if you start to feel better.  Do not have sex until treatment is completed.  Tell all sexual partners that you have a vaginal infection. They should see their health care provider and be treated if they have problems, such as a mild rash or itching.  Practice safe sex by using condoms and only having one sex partner. SEEK MEDICAL CARE IF:   Your symptoms are not improving after 3 days of treatment.  You have increased discharge or pain.  You have a fever. MAKE SURE YOU:   Understand these instructions.  Will watch your condition.  Will get help right away if you are not doing well or get worse. FOR MORE INFORMATION  Centers for Disease Control and Prevention, Division of STD Prevention: www.cdc.gov/std American Sexual Health Association (ASHA): www.ashastd.org  Document Released: 10/24/2005 Document Revised: 08/14/2013 Document Reviewed: 06/05/2013 ExitCare Patient Information 2015 [employer]. This information is not intended to replace advice given to you by your health care provider. Make sure you discuss any questions you have with your health care provider.  

## 2015-02-24 LAB — URINALYSIS, MICROSCOPIC ONLY
Bacteria, UA: NONE SEEN
CRYSTALS: NONE SEEN
Casts: NONE SEEN
Squamous Epithelial / LPF: NONE SEEN

## 2015-02-24 NOTE — Progress Notes (Signed)
Reviewed personally.  M. Suzanne Tessie Ordaz, MD.  

## 2015-02-25 LAB — URINE CULTURE
Colony Count: NO GROWTH
Organism ID, Bacteria: NO GROWTH

## 2015-03-05 ENCOUNTER — Encounter: Payer: Self-pay | Admitting: Obstetrics and Gynecology

## 2015-03-05 ENCOUNTER — Ambulatory Visit (INDEPENDENT_AMBULATORY_CARE_PROVIDER_SITE_OTHER): Payer: BLUE CROSS/BLUE SHIELD | Admitting: Obstetrics and Gynecology

## 2015-03-05 ENCOUNTER — Telehealth: Payer: Self-pay | Admitting: Cardiovascular Disease

## 2015-03-05 ENCOUNTER — Ambulatory Visit: Payer: PRIVATE HEALTH INSURANCE | Admitting: Obstetrics and Gynecology

## 2015-03-05 ENCOUNTER — Telehealth: Payer: Self-pay | Admitting: Emergency Medicine

## 2015-03-05 ENCOUNTER — Ambulatory Visit (INDEPENDENT_AMBULATORY_CARE_PROVIDER_SITE_OTHER): Payer: BLUE CROSS/BLUE SHIELD | Admitting: Cardiology

## 2015-03-05 ENCOUNTER — Encounter: Payer: Self-pay | Admitting: Cardiology

## 2015-03-05 VITALS — BP 116/82 | HR 81 | Ht 66.0 in | Wt 155.8 lb

## 2015-03-05 VITALS — BP 112/78 | HR 92 | Resp 20 | Ht 66.0 in | Wt 155.2 lb

## 2015-03-05 DIAGNOSIS — R002 Palpitations: Secondary | ICD-10-CM | POA: Diagnosis not present

## 2015-03-05 DIAGNOSIS — E785 Hyperlipidemia, unspecified: Secondary | ICD-10-CM

## 2015-03-05 DIAGNOSIS — N761 Subacute and chronic vaginitis: Secondary | ICD-10-CM | POA: Diagnosis not present

## 2015-03-05 DIAGNOSIS — Z01419 Encounter for gynecological examination (general) (routine) without abnormal findings: Secondary | ICD-10-CM | POA: Diagnosis not present

## 2015-03-05 DIAGNOSIS — R1013 Epigastric pain: Secondary | ICD-10-CM | POA: Diagnosis not present

## 2015-03-05 DIAGNOSIS — I5032 Chronic diastolic (congestive) heart failure: Secondary | ICD-10-CM | POA: Diagnosis not present

## 2015-03-05 DIAGNOSIS — E8940 Asymptomatic postprocedural ovarian failure: Secondary | ICD-10-CM | POA: Diagnosis not present

## 2015-03-05 DIAGNOSIS — Z79890 Hormone replacement therapy (postmenopausal): Secondary | ICD-10-CM

## 2015-03-05 DIAGNOSIS — M797 Fibromyalgia: Secondary | ICD-10-CM

## 2015-03-05 MED ORDER — ESTRADIOL 0.0375 MG/24HR TD PTWK: 0.0375 mg | MEDICATED_PATCH | TRANSDERMAL | Status: DC

## 2015-03-05 MED ORDER — ESTRADIOL 2 MG VA RING: 2.0000 mg | VAGINAL_RING | VAGINAL | Status: DC

## 2015-03-05 NOTE — Patient Instructions (Addendum)

## 2015-03-05 NOTE — Telephone Encounter (Signed)
Patient seen by cardiology today.  Had her annual exam with me at the end of the afternoon.   Encounter closed.

## 2015-03-05 NOTE — Telephone Encounter (Signed)
Nurse Nevin Bloodgood from Dr. Sheralyn Boatman office at [place] calling. States that patient called PCP this morning with cardiac concerns and PCP advised to call cardiologist Dr. Katharina Caper.  Dr. Katharina Caper not in office and are attempting to work patient in for evaluation, however, may over lap with appointment with Dr. Quincy Simmonds today. Nevin Bloodgood would like to know if we have ability to complete EKG in our office and I advised that we do not.  Patient has appointment with Dr. Quincy Simmonds today for annual exam at 1600.  I advised that if patient needs to be seen for cardiac concern that annual exam can certainly be rescheduled when patient is feeling improved.  Nevin Bloodgood states she will call patient and discuss with her. Will call back prn.

## 2015-03-05 NOTE — Telephone Encounter (Signed)
Pt c/o of Chest Pain: STAT if CP now or developed within 24 hours  1. Are you having CP right now? Yes  2. Are you experiencing any other symptoms (ex. SOB, nausea, vomiting, sweating)? Upset stomach, fatigue, constant heaviness in chest  3. How long have you been experiencing CP? A Couple of weeks ago but been constant for the past week  4. Is your CP continuous or coming and going? Continuous, much worse when she lays down  5. Have you taken Nitroglycerin? No ?

## 2015-03-05 NOTE — Progress Notes (Signed)
03/05/2015 Lauren Montes   1956-03-21  [id number]  Primary Physician Haywood Pao, MD Primary Cardiologist: Dr Acie Fredrickson  HPI:  Pleasant 59 y/o followed by Dr Al Corpus and Dr Acie Fredrickson. She had a low risk Myoview in 2009. She has dyslipidemia and fibromyalgia but has tolerated Crestor. She has had palpitations in the past but no documented arrhythmia. Echo done in Aug 2015 was normal.  she is seen in the office today as an add on. She has been having "chest pain" for two weeks. This is actually LUQ discomfort, localized to her Lt lower ribs and reproducible with palpation. She thinks it may be worse with deep breathing but no significant SOB. She denies chest pressure or tightness. She had called her PCP who suggested she come here for evaluation.    Current Outpatient Prescriptions  Medication Sig Dispense Refill  . ALREX 0.2 % SUSP Place 1 drop into both eyes as needed.     . calcium-vitamin D (OSCAL WITH D) 500-200 MG-UNIT per tablet Take 1 tablet by mouth daily.     . cetirizine (ZYRTEC) 10 MG tablet Take 10 mg by mouth daily.    . CRESTOR 10 MG tablet TAKE 1 TABLET (10 MG TOTAL) BY MOUTH DAILY. 30 tablet 11  . cycloSPORINE (RESTASIS) 0.05 % ophthalmic emulsion Place 1 drop into both eyes 2 (two) times daily.    Marland Kitchen diltiazem (CARDIZEM CD) 120 MG 24 hr capsule Take 1 capsule (120 mg total) by mouth daily. 90 capsule 3  . estradiol (CLIMARA - DOSED IN MG/24 HR) 0.0375 mg/24hr patch Place 1 patch (0.0375 mg total) onto the skin once a week. 12 patch 3  . fluconazole (DIFLUCAN) 150 MG tablet Take 150 mg by mouth once a week.    . fluticasone-salmeterol (ADVAIR HFA) 115-21 MCG/ACT inhaler Inhale 2 puffs into the lungs 2 (two) times daily.    Marland Kitchen levalbuterol (XOPENEX HFA) 45 MCG/ACT inhaler Inhale 1-2 puffs into the lungs every 4 (four) hours as needed.      . montelukast (SINGULAIR) 10 MG tablet Take 10 mg by mouth at bedtime.      Marland Kitchen nystatin-triamcinolone (MYCOLOG II) cream Apply 1  application topically 2 (two) times daily. Apply to affected area BID for up to 7 days. 60 g 0  . Omega-3 Fatty Acids (OMEGA 3 PO) Take 1 capsule by mouth daily.      Earney Navy Bicarbonate (ZEGERID) 20-1100 MG CAPS capsule Take 1 capsule by mouth daily before breakfast.    . QNASL 80 MCG/ACT AERS Place 2 sprays into both nostrils daily.   5  . terconazole (TERAZOL 7) 0.4 % vaginal cream Place 1 applicator vaginally at bedtime. One applicator full QHS for seven days of therapy 45 g 1   No current facility-administered medications for this visit.    Allergies  Allergen Reactions  . Codeine Nausea And Vomiting    Migraine headache  . Lipitor [Atorvastatin] Other (See Comments)    Muscle ache, mind foggy,     History   Social History  . Marital Status: Married    Spouse Name: N/A  . Number of Children: 1  . Years of Education: N/A   Occupational History  . retired    Social History Main Topics  . Smoking status: Never Smoker   . Smokeless tobacco: Never Used  . Alcohol Use: 4.2 oz/week    7 Glasses of wine per week  . Drug Use: No  . Sexual Activity:  Partners: Male    Patent examiner Protection: Surgical     Comment: Hyst   Other Topics Concern  . Not on file   Social History Narrative     Review of Systems: General: negative for chills, fever, night sweats or weight changes.  Cardiovascular: negative for chest pain, dyspnea on exertion, edema, orthopnea, palpitations, paroxysmal nocturnal dyspnea or shortness of breath Dermatological: negative for rash Respiratory: negative for cough or wheezing Urologic: negative for hematuria Abdominal: negative for nausea, vomiting, diarrhea, bright red blood per rectum, melena, or hematemesis Neurologic: negative for visual changes, syncope, or dizziness All other systems reviewed and are otherwise negative except as noted above.    Blood pressure 116/82, pulse 81, height 5\' 6"  (1.676 m), weight 155 lb 12.8 oz  (70.67 kg), last menstrual period 11/07/1980.  General appearance: alert, cooperative and no distress Neck: no carotid bruit and no JVD Lungs: clear to auscultation bilaterally Heart: regular rate and rhythm Abdomen: tender Lt rib area, no RUQ tenderness Extremities: no edema or calf pain Pulses: 2+ and symmetric Skin: Skin color, texture, turgor normal. No rashes or lesions Neurologic: Grossly normal  EKG NSR without acute changes  ASSESSMENT AND PLAN:   ABDOMINAL PAIN-EPIGASTRIC Seen in the office today with "chest pain" actually Lt upper quadrant localized pain   Palpitations Chronic, really no worse than usual. She notices her HR to be fast at night.   Hyperlipidemia On statin Rx   Fibromyalgia Followed by Dr Patrecia Pour   PLAN  I reassured Ms Unknown Jim that her pain did not sound cardiac in nature. I offered a repeat Myoview if her symptoms change and she'll let us know.   [facility] KPA-[facility] 03/05/2015 2:33 PM

## 2015-03-05 NOTE — Assessment & Plan Note (Signed)
Followed by Dr Devashwar 

## 2015-03-05 NOTE — Assessment & Plan Note (Signed)
Seen in the office today with "chest pain" actually Lt upper quadrant localized pain

## 2015-03-05 NOTE — Assessment & Plan Note (Signed)
On statin Rx 

## 2015-03-05 NOTE — Progress Notes (Signed)
59 y.o. G72P1001 Married Caucasian female here for annual exam.    Struggling with vaginal and vulvar irritation.  Stopped [employer].  Has tried the Vagifem and vaginal cream.  Treated with Metrogel and Diflucan in past.  Coconut oil and cooking oil seems to be as good as vaginal estrogen.  Terconazole used two days nights ago.  Probiotics not helping. Does well initially, and then a few days later has burning pain.    Not having intercourse due to pain.  Introital pain and vaginal pain.   Does not have spontaneous burning. Anything irritates the vulva.  Wants to sit with clothes off and have her legs apart.   Cardiology ok with ERT. On Minivelle 0.0375 mg.   Some left upper quadrant discomfort, points to her left midcostal margin.  Working out.    PCP: Domenick Gong, MD  Patient's last menstrual period was 11/07/1980.          Sexually active: Yes.    The current method of family planning is status post hysterectomy.    Exercising: Yes.    yoga, aerobics (low impact) 2x/wk Smoker:  no  Health Maintenance: Pap:  02/09/2011 Negative History of abnormal Pap:  no MMG:  08/05/14 Bi-Rads 1: Negative Colonoscopy:  Almost 10 years ago (2007?) - Normal f/u due in 2017  BMD:  2015   Result  Normal TDaP:  Not sure.  Up to date with PCP.  Screening Labs: no  Hb today: PCP, Urine today: PCP   reports that she has never smoked. She has never used smokeless tobacco. She reports that she drinks about 4.2 oz of alcohol per week. She reports that she does not use illicit drugs.  Past Medical History  Diagnosis Date  . History of diastolic dysfunction   . Asthma   . History of migraine headaches   . Leg edema     left leg  . Tachycardia   . Cystic teratoma   . Elevated cholesterol   . Ocular rosacea   . GERD (gastroesophageal reflux disease)   . Endometriosis   . Blood transfusion without reported diagnosis 1975    with vaginal delivery--very anemic    Past Surgical History   Procedure Laterality Date  . Foot surgery      LEFT FOOT  . Cardiovascular stress test  07/02/2008    EF 55-60%  . Appendectomy    . Bunionectomy      bilateral  . Left breast mass removed    . Rso  2002    partial  . Dental surgery  2007  . Ruptured disc surgery  07,08  . Back surgery    . Oophorectomy  1982    RSO partial and LSO, uterus  . Total abdominal hysterectomy    . Breast surgery  10-09-13    breast reduction--Dr. Dessie Coma    Current Outpatient Prescriptions  Medication Sig Dispense Refill  . ALREX 0.2 % SUSP Place 1 drop into both eyes as needed.     . calcium-vitamin D (OSCAL WITH D) 500-200 MG-UNIT per tablet Take 1 tablet by mouth daily.     . cetirizine (ZYRTEC) 10 MG tablet Take 10 mg by mouth daily.    . CRESTOR 10 MG tablet TAKE 1 TABLET (10 MG TOTAL) BY MOUTH DAILY. 30 tablet 11  . cycloSPORINE (RESTASIS) 0.05 % ophthalmic emulsion Place 1 drop into both eyes 2 (two) times daily.    Marland Kitchen diltiazem (CARDIZEM CD) 120 MG 24 hr capsule Take  1 capsule (120 mg total) by mouth daily. 90 capsule 3  . estradiol (CLIMARA - DOSED IN MG/24 HR) 0.0375 mg/24hr patch Place 1 patch (0.0375 mg total) onto the skin once a week. 12 patch 3  . fluconazole (DIFLUCAN) 150 MG tablet Take 150 mg by mouth once a week.    . fluticasone-salmeterol (ADVAIR HFA) 115-21 MCG/ACT inhaler Inhale 2 puffs into the lungs 2 (two) times daily.    Marland Kitchen levalbuterol (XOPENEX HFA) 45 MCG/ACT inhaler Inhale 1-2 puffs into the lungs every 4 (four) hours as needed.      . montelukast (SINGULAIR) 10 MG tablet Take 10 mg by mouth at bedtime.      Marland Kitchen nystatin-triamcinolone (MYCOLOG II) cream Apply 1 application topically 2 (two) times daily. Apply to affected area BID for up to 7 days. 60 g 0  . Omega-3 Fatty Acids (OMEGA 3 PO) Take 1 capsule by mouth daily.      Earney Navy Bicarbonate (ZEGERID) 20-1100 MG CAPS capsule Take 1 capsule by mouth daily before breakfast.    . QNASL 80 MCG/ACT AERS Place 2  sprays into both nostrils daily.   5  . terconazole (TERAZOL 7) 0.4 % vaginal cream Place 1 applicator vaginally at bedtime. One applicator full QHS for seven days of therapy 45 g 1   No current facility-administered medications for this visit.    Family History  Problem Relation Age of Onset  . Coronary artery disease Father   . Hypertension Father   . Heart disease Father   . Asthma Mother   . Hypertension Mother   . Alzheimer's disease Maternal Grandmother   . Lung cancer Maternal Grandfather   . Heart failure Paternal Grandfather   . Colon cancer Neg Hx   . Colon polyps Neg Hx   . Rectal cancer Neg Hx   . Stomach cancer Neg Hx   . Skin cancer Sister   . Hepatitis C Brother     ROS:  Pertinent items are noted in HPI.  Otherwise, a comprehensive ROS was negative.  Exam:   BP 112/78 mmHg  Pulse 92  Resp 20  Ht 5\' 6"  (1.676 m)  Wt 155 lb 3.2 oz (70.398 kg)  BMI 25.06 kg/m2  LMP 11/07/1980    General appearance: alert, cooperative and appears stated age Head: Normocephalic, without obvious abnormality, atraumatic Neck: no adenopathy, supple, symmetrical, trachea midline and thyroid normal to inspection and palpation Lungs: clear to auscultation bilaterally Breasts: normal appearance, no masses or tenderness, Inspection negative, No nipple retraction or dimpling, No nipple discharge or bleeding, No axillary or supraclavicular adenopathy.  Consistent with breast reduction.  Heart: regular rate and rhythm Abdomen: soft, non-tender; bowel sounds normal; no masses,  no organomegaly Extremities: extremities normal, atraumatic, no cyanosis or edema Skin: Skin color, texture, turgor normal. No rashes or lesions Lymph nodes: Cervical, supraclavicular, and axillary nodes normal. No abnormal inguinal nodes palpated Neurologic: Grossly normal  Pelvic: External genitalia:  no lesions              Urethra:  normal appearing urethra with no masses, tenderness or lesions               Bartholins and Skenes: normal                 Vagina: normal appearing vagina with normal color and discharge, no lesions              Cervix: absent  Pap taken: Yes.   Bimanual Exam:  Uterus:  uterus absent              Adnexa: normal adnexa and no mass, fullness, tenderness              Rectovaginal: Yes.  .  Confirms.              Anus:  normal sphincter tone, no lesions  Chaperone was present for exam.  Assessment:   Well woman visit. Status post TAH/partial RSO/LSO.  Endometriosis/dermoid. Chronic vaginitis/vulvitis.  Component of Vulvodynia Atrophy.  ERT patient.  History of fibromyalgia.  On Gabapentin.  Costochondritis?  Plan: Yearly mammogram recommended after age 56.  Recommended self breast exam.  Pap and HR HPV as above. Discussed Calcium, Vitamin D, regular exercise program including cardiovascular and weight bearing exercise. Labs performed.  Yes.  .   See orders.  Affirm testing. Refills given on medications.  Yes.  .  See orders.  Minivelle refill 0.0375 mg weekly dosing and will start Estring.  Discussed risk of DVT, PE, MI, stroke, breast cancer.  Discussed boric acid suppositories and Hylafem option.  I discussed vulvodynia with patient.  If Estring and anticipated boric acid tx do not improve symptoms, will consider adding Elavil 25 mg po q hs.  NSAIDS for left costal pain.  Follow up annually and prn.   Additional counseling given regarding chronic vulvovaginitis.  15 minutes of which over 50% was spent in counseling.  After visit summary provided.

## 2015-03-05 NOTE — Assessment & Plan Note (Signed)
Chronic, really no worse than usual. She notices her HR to be fast at night.

## 2015-03-05 NOTE — Telephone Encounter (Signed)
Patient reporting recent episodes of "frequent chest heaviness/nagging pressure and fatigue" over past two weeks with past week becoming more constant. She states she is also experiencing this week episodes, several per day, of feeling like her heart is racing. This mostly happens in the evening. Patient has not been able to check BP/HR (as she has recently moved and misplaced her BP monitor). Denies SOB associated with episodes (although she is asthmatic). Denies dizziness at any time. Patient has appointment this afternoon at 1600 for GYN, Dr. Josefa Half [phone]). I called over to Dr. Elza Rafter office and asked nurse, Olivia Mackie, if they could do an EKG during that appointment and she said they do not have that capability. Kerin Ransom, [place], reviewed and will see patient today at 2:00 pm. Patient notified and is agreeable.

## 2015-03-05 NOTE — Patient Instructions (Signed)
Medication Instructions:  Your physician recommends that you continue on your current medications as directed. Please refer to the Current Medication list given to you today.   Labwork: NONE TODAY   Testing/Procedures: NONE TODAY   Follow-Up: YEAR WITH NAHSER  Any Other Special Instructions Will Be Listed Below (If Applicable).

## 2015-03-06 ENCOUNTER — Telehealth: Payer: Self-pay | Admitting: Obstetrics and Gynecology

## 2015-03-06 MED ORDER — HYLAFEM VA SUPP: 1.0000 | Freq: Every day | VAGINAL | Status: DC

## 2015-03-06 NOTE — Telephone Encounter (Signed)
Thank you for sending the Rx to her pharmacy.

## 2015-03-06 NOTE — Telephone Encounter (Signed)
Called patient. Advised results not available at this time, however, patient would be contacted as soon as they are available.   Reviewed with Dr. Quincy Simmonds. Can have Hylafem capsule 1 pv x 3 nights #6 no refills. Ordered to [facility]. Patient aware she will be contacted when available.   Patient states that Estring is requiring a prior authorization. Advised will complete forms, but does take some time to obtain prior authorization. Patient agreeable.

## 2015-03-06 NOTE — Telephone Encounter (Signed)
Pt calling to check on results of labs she was told would be in today.

## 2015-03-07 LAB — WET PREP BY MOLECULAR PROBE
Candida species: NEGATIVE
Gardnerella vaginalis: NEGATIVE
Trichomonas vaginosis: NEGATIVE

## 2015-03-09 ENCOUNTER — Telehealth: Payer: Self-pay | Admitting: Emergency Medicine

## 2015-03-09 NOTE — Telephone Encounter (Signed)
Outgoing call to [facility] to request prior authorization for Estring vaginal ring.   Form will be faxed to our office. Form completed and sent to Dr. Quincy Simmonds to review.

## 2015-03-10 LAB — IPS PAP TEST WITH HPV

## 2015-03-19 ENCOUNTER — Encounter: Payer: Self-pay | Admitting: Obstetrics and Gynecology

## 2015-03-19 ENCOUNTER — Ambulatory Visit (INDEPENDENT_AMBULATORY_CARE_PROVIDER_SITE_OTHER): Payer: BLUE CROSS/BLUE SHIELD | Admitting: Obstetrics and Gynecology

## 2015-03-19 VITALS — BP 122/78 | HR 80 | Resp 20 | Wt 155.8 lb

## 2015-03-19 DIAGNOSIS — N952 Postmenopausal atrophic vaginitis: Secondary | ICD-10-CM

## 2015-03-19 NOTE — Progress Notes (Signed)
GYNECOLOGY  VISIT   HPI: 59 y.o.   Married  Caucasian  female   G1P1001 with Patient's last menstrual period was 11/07/1980.   here for vaginitis recheck.  Has itching just inside the vagina. Did Hylafem for 2 courses.  Last Affirm was negative 03/06/15.  Did not start Estring, but has it at home.   GYNECOLOGIC HISTORY: Patient's last menstrual period was 11/07/1980. Contraception: hysterectomy Menopausal hormone therapy: climara patch (has not used Estring yet) Last mammogram: 08/05/15 3D-normal Last pap smear: 03/06/15 WNL/negative HR HPV        OB History    Gravida Para Term Preterm AB TAB SAB Ectopic Multiple Living   1 1 1       1          Patient Active Problem List   Diagnosis Date Noted  . Palpitations 03/05/2015  . Fibromyalgia 03/05/2015  . Chronic diastolic CHF (congestive heart failure), NYHA class 2 06/20/2014  . Sinus tachycardia 09/14/2012  . Dysphagia 06/29/2012  . Ocular rosacea   . GERD (gastroesophageal reflux disease)   . Cough 09/09/2011  . Chest tightness, discomfort, or pressure 09/09/2011  . Hyperlipidemia 05/10/2011  . SWELLING OF LIMB 01/20/2011  . GERD 02/03/2010  . ABDOMINAL PAIN-EPIGASTRIC 02/03/2010  . ABDOMINAL PAIN-MULTIPLE SITES 02/03/2010  . CONSTIPATION 09/19/2008  . ABDOMINAL PAIN, LEFT LOWER QUADRANT 09/19/2008  . COLONIC POLYPS, HYPERPLASTIC, HX OF 09/19/2008    Past Medical History  Diagnosis Date  . History of diastolic dysfunction   . Asthma   . History of migraine headaches   . Leg edema     left leg  . Tachycardia   . Cystic teratoma   . Elevated cholesterol   . Ocular rosacea   . GERD (gastroesophageal reflux disease)   . Endometriosis   . Blood transfusion without reported diagnosis 1975    with vaginal delivery--very anemic    Past Surgical History  Procedure Laterality Date  . Foot surgery      LEFT FOOT  . Cardiovascular stress test  07/02/2008    EF 55-60%  . Appendectomy    . Bunionectomy       bilateral  . Left breast mass removed    . Rso  2002    partial  . Dental surgery  2007  . Ruptured disc surgery  07,08  . Back surgery    . Oophorectomy  1982    RSO partial and LSO, uterus  . Total abdominal hysterectomy    . Breast surgery  10-09-13    breast reduction--Dr. Dessie Coma    Current Outpatient Prescriptions  Medication Sig Dispense Refill  . ALREX 0.2 % SUSP Place 1 drop into both eyes as needed.     . calcium-vitamin D (OSCAL WITH D) 500-200 MG-UNIT per tablet Take 1 tablet by mouth daily.     . cetirizine (ZYRTEC) 10 MG tablet Take 10 mg by mouth daily.    . CRESTOR 10 MG tablet TAKE 1 TABLET (10 MG TOTAL) BY MOUTH DAILY. 30 tablet 11  . cycloSPORINE (RESTASIS) 0.05 % ophthalmic emulsion Place 1 drop into both eyes 2 (two) times daily.    Marland Kitchen diltiazem (CARDIZEM CD) 120 MG 24 hr capsule Take 1 capsule (120 mg total) by mouth daily. 90 capsule 3  . estradiol (CLIMARA - DOSED IN MG/24 HR) 0.0375 mg/24hr patch Place 1 patch (0.0375 mg total) onto the skin once a week. 12 patch 3  . fluticasone-salmeterol (ADVAIR HFA) 115-21 MCG/ACT inhaler Inhale 2 puffs  into the lungs 2 (two) times daily.    Marland Kitchen levalbuterol (XOPENEX HFA) 45 MCG/ACT inhaler Inhale 1-2 puffs into the lungs every 4 (four) hours as needed.      . montelukast (SINGULAIR) 10 MG tablet Take 10 mg by mouth at bedtime.      . Omega-3 Fatty Acids (OMEGA 3 PO) Take 1 capsule by mouth daily.      Earney Navy Bicarbonate (ZEGERID) 20-1100 MG CAPS capsule Take 1 capsule by mouth daily before breakfast.    . QNASL 80 MCG/ACT AERS Place 2 sprays into both nostrils daily.   5  . estradiol (ESTRING) 2 MG vaginal ring Place 2 mg vaginally every 3 (three) months. Insert a new ring into vagina every 3 months (Patient not taking: Reported on 03/19/2015) 1 each 3  . Homeopathic Products (HYLAFEM) SUPP Place 1 capsule vaginally at bedtime. (Patient not taking: Reported on 03/19/2015) 6 suppository 0   No current  facility-administered medications for this visit.     ALLERGIES: Codeine and Lipitor  Family History  Problem Relation Age of Onset  . Coronary artery disease Father   . Hypertension Father   . Heart disease Father   . Asthma Mother   . Hypertension Mother   . Alzheimer's disease Maternal Grandmother   . Lung cancer Maternal Grandfather   . Heart failure Paternal Grandfather   . Colon cancer Neg Hx   . Colon polyps Neg Hx   . Rectal cancer Neg Hx   . Stomach cancer Neg Hx   . Skin cancer Sister   . Hepatitis C Brother     History   Social History  . Marital Status: Married    Spouse Name: N/A  . Number of Children: 1  . Years of Education: N/A   Occupational History  . retired    Social History Main Topics  . Smoking status: Never Smoker   . Smokeless tobacco: Never Used  . Alcohol Use: 1.8 - 2.4 oz/week    3-4 Glasses of wine per week  . Drug Use: No  . Sexual Activity:    Partners: Male    Birth Control/ Protection: Surgical     Comment: Hyst   Other Topics Concern  . Not on file   Social History Narrative    ROS:  Pertinent items are noted in HPI.  PHYSICAL EXAMINATION:    BP 122/78 mmHg  Pulse 80  Resp 20  Wt 155 lb 12.8 oz (70.67 kg)  LMP 11/07/1980     General appearance: alert, cooperative and appears stated age   Pelvic: External genitalia:  no lesions              Urethra:  normal appearing urethra with no masses, tenderness or lesions              Bartholins and Skenes: normal                 Vagina: normal appearing vagina with normal color and discharge, no lesions              Cervix:  absent                   Bimanual Exam:  Uterus:  absent                                      Adnexa: normal adnexa in size, nontender  and no masses                                     Wet prep - pH 4.0, negative yeast, clue cells and trichomonas.   ASSESSMENT  Atrophic vaginal symptoms.  PLAN  Discussed vaginal atrophy. Start Estring.   Follow up in one month.     An After Visit Summary was printed and given to the patient.  ___15___ minutes face to face time of which over 50% was spent in counseling.

## 2015-03-24 ENCOUNTER — Telehealth: Payer: Self-pay

## 2015-03-24 NOTE — Telephone Encounter (Signed)
Dr.Silva, I have written an appeal letter for patient's Estring prescription. It is save in Epic for your review before signing. Thank  you.

## 2015-03-25 ENCOUNTER — Telehealth: Payer: Self-pay

## 2015-03-25 NOTE — Telephone Encounter (Signed)
Spoke with patient regarding appeal process for her Estring prescription. Advised per review of appeal process we will need an appeal form with permission to submit appeal with her signature. Patient is agreeable. She is out of town and would like to come to the office next Tuesday to sign the appeal release form. Advised I will print the release form and place up front for patient to sign. Once we have that form completed we can submit the appeal. Patient is agreeable.  Routing to Dr.Silva as FYI.

## 2015-03-25 NOTE — Telephone Encounter (Signed)
Thank you for the update. I have signed the letter.

## 2015-03-25 NOTE — Telephone Encounter (Signed)
Thank you for the update!

## 2015-03-25 NOTE — Telephone Encounter (Signed)
I agree with your letter.  I may be best for it to be signed with my name. Thank you for writing it.

## 2015-03-25 NOTE — Telephone Encounter (Signed)
Letter to your desk for review and signature.

## 2015-04-01 NOTE — Telephone Encounter (Signed)
Patient came by office today to sign release for appeal. I have faxed appeal letter and release for Estring Rx to [facility] with cover sheet at [phone].

## 2015-04-01 NOTE — Telephone Encounter (Signed)
Thank you. I hope that the Estring is approved because I can see strong benefit for her.

## 2015-04-16 ENCOUNTER — Encounter: Payer: Self-pay | Admitting: Obstetrics and Gynecology

## 2015-04-16 ENCOUNTER — Ambulatory Visit (INDEPENDENT_AMBULATORY_CARE_PROVIDER_SITE_OTHER): Payer: BLUE CROSS/BLUE SHIELD | Admitting: Obstetrics and Gynecology

## 2015-04-16 VITALS — BP 110/70 | HR 76 | Ht 66.0 in | Wt 156.6 lb

## 2015-04-16 DIAGNOSIS — N952 Postmenopausal atrophic vaginitis: Secondary | ICD-10-CM

## 2015-04-16 DIAGNOSIS — N3281 Overactive bladder: Secondary | ICD-10-CM | POA: Diagnosis not present

## 2015-04-16 MED ORDER — MIRABEGRON ER 25 MG PO TB24: 25.0000 mg | ORAL_TABLET | Freq: Every day | ORAL | Status: DC

## 2015-04-16 NOTE — Patient Instructions (Signed)
Mirabegron extended-release tablets What is this medicine? MIRABEGRON (MIR a BEG ron) is used to treat overactive bladder. This medicine reduces the amount of bathroom visits. It may also help to control wetting accidents. This medicine may be used for other purposes; ask your health care provider or pharmacist if you have questions. COMMON BRAND NAME(S): Myrbetriq What should I tell my health care provider before I take this medicine? They need to know if you have any of these conditions: -difficulty passing urine -high blood pressure -kidney disease -liver disease -an unusual or allergic reaction to mirabegron, other medicines, foods, dyes, or preservatives -pregnant or trying to get pregnant -breast-feeding How should I use this medicine? Take this medicine by mouth with a glass of water. Follow the directions on the prescription label. Do not cut, crush or chew this medicine. You can take it with or without food. If it upsets your stomach, take it with food. Take your medicine at regular intervals. Do not take it more often than directed. Do not stop taking except on your doctor's advice. Talk to your pediatrician regarding the use of this medicine in children. Special care may be needed. Overdosage: If you think you've taken too much of this medicine contact a poison control center or emergency room at once. Overdosage: If you think you have taken too much of this medicine contact a poison control center or emergency room at once. NOTE: This medicine is only for you. Do not share this medicine with others. What if I miss a dose? If you miss a dose, take it as soon as you can. If it is almost time for your next dose, take only that dose. Do not take double or extra doses. What may interact with this medicine? -certain medicines for bladder problems like fesoterodine, oxybutynin, solifenacin, tolterodine -desipramine -digoxin -flecainide -ketoconazole -MAOIs like Carbex, Eldepryl,  Marplan, Nardil, and Parnate -metoprolol -propafenone -thioridazine -warfarin This list may not describe all possible interactions. Give your health care provider a list of all the medicines, herbs, non-prescription drugs, or dietary supplements you use. Also tell them if you smoke, drink alcohol, or use illegal drugs. Some items may interact with your medicine. What should I watch for while using this medicine? It may take 8 weeks to notice the full benefit from this medicine. You may need to limit your intake tea, coffee, caffeinated sodas, and alcohol. These drinks may make your symptoms worse. Visit your doctor or health care professional for regular checks on your progress. Check your blood pressure as directed. Ask your doctor or health care professional what your blood pressure should be and when you should contact him or her. What side effects may I notice from receiving this medicine? Side effects that you should report to your doctor or health care professional as soon as possible: -allergic reactions like skin rash, itching or hives, swelling of the face, lips, or tongue -chest pain or palpitations -severe or sudden headache -high blood pressure -fast, irregular heartbeat -redness, blistering, peeling or loosening of the skin, including inside the mouth -signs of infection - fever or chills, pain or difficulty passing urine -trouble passing urine or change in the amount of urine Side effects that usually do not require medical attention (Report these to your doctor or health care professional if they continue or are bothersome.): -constipation -dry eyes -joint pain -mild headache -nausea -runny nose This list may not describe all possible side effects. Call your doctor for medical advice about side effects. You may report  side effects to FDA at 1-800-FDA-1088. Where should I keep my medicine? Keep out of the reach of children. Store at room temperature between 15 and 30  degrees C (59 and 86 degrees F). Throw away any unused medicine after the expiration date. NOTE: This sheet is a summary. It may not cover all possible information. If you have questions about this medicine, talk to your doctor, pharmacist, or health care provider.  2015, Elsevier/Gold Standard. (2012-07-06 15:59:47)

## 2015-04-16 NOTE — Progress Notes (Signed)
Patient ID: Lauren Montes, female   DOB: Feb 14, 1956, 59 y.o.   MRN: [id number] GYNECOLOGY  VISIT   HPI: 59 y.o.   Married  Caucasian  female   G1P1001 with Patient's last menstrual period was 11/07/1980.   here for 4 week recheck. Patient states she is feeling much better. Using Estring for 4 weeks.  Felt better after just a few days.  The outer area is occasionally dry, but much less.   Bladder issues still persist.  Occasionally has a heaviness in her bladder.  Voids often.  Up 2 - 3 times at night.  Voiding often but not a lot of volume.  Has irregular heart and controls this with medication.   GYNECOLOGIC HISTORY: Patient's last menstrual period was 11/07/1980. Contraception:Hysterecetomy Menopausal hormone therapy: Climara 0.0375mg , Estring 2mg  Last mammogram: 08-04-14 Breast Density Cat:B/nl:  Last pap smear: 03-06-15 wnl:neg HR HPV        OB History    Gravida Para Term Preterm AB TAB SAB Ectopic Multiple Living   1 1 1       1          Patient Active Problem List   Diagnosis Date Noted  . Palpitations 03/05/2015  . Fibromyalgia 03/05/2015  . Chronic diastolic CHF (congestive heart failure), NYHA class 2 06/20/2014  . Sinus tachycardia 09/14/2012  . Dysphagia 06/29/2012  . Ocular rosacea   . GERD (gastroesophageal reflux disease)   . Cough 09/09/2011  . Chest tightness, discomfort, or pressure 09/09/2011  . Hyperlipidemia 05/10/2011  . SWELLING OF LIMB 01/20/2011  . GERD 02/03/2010  . ABDOMINAL PAIN-EPIGASTRIC 02/03/2010  . ABDOMINAL PAIN-MULTIPLE SITES 02/03/2010  . CONSTIPATION 09/19/2008  . ABDOMINAL PAIN, LEFT LOWER QUADRANT 09/19/2008  . COLONIC POLYPS, HYPERPLASTIC, HX OF 09/19/2008    Past Medical History  Diagnosis Date  . History of diastolic dysfunction   . Asthma   . History of migraine headaches   . Leg edema     left leg  . Tachycardia   . Cystic teratoma   . Elevated cholesterol   . Ocular rosacea   . GERD (gastroesophageal reflux  disease)   . Endometriosis   . Blood transfusion without reported diagnosis 1975    with vaginal delivery--very anemic  . Overactive bladder   . Atrophic vaginitis     Past Surgical History  Procedure Laterality Date  . Foot surgery      LEFT FOOT  . Cardiovascular stress test  07/02/2008    EF 55-60%  . Appendectomy    . Bunionectomy      bilateral  . Left breast mass removed    . Rso  2002    partial  . Dental surgery  2007  . Ruptured disc surgery  07,08  . Back surgery    . Oophorectomy  1982    RSO partial and LSO, uterus  . Total abdominal hysterectomy    . Breast surgery  10-09-13    breast reduction--Dr. Dessie Coma    Current Outpatient Prescriptions  Medication Sig Dispense Refill  . ALREX 0.2 % SUSP Place 1 drop into both eyes as needed.     . calcium-vitamin D (OSCAL WITH D) 500-200 MG-UNIT per tablet Take 1 tablet by mouth daily.     . cetirizine (ZYRTEC) 10 MG tablet Take 10 mg by mouth daily.    . CRESTOR 10 MG tablet TAKE 1 TABLET (10 MG TOTAL) BY MOUTH DAILY. 30 tablet 11  . cycloSPORINE (RESTASIS) 0.05 % ophthalmic emulsion Place  1 drop into both eyes 2 (two) times daily.    Marland Kitchen diltiazem (CARDIZEM CD) 120 MG 24 hr capsule Take 1 capsule (120 mg total) by mouth daily. 90 capsule 3  . estradiol (CLIMARA - DOSED IN MG/24 HR) 0.0375 mg/24hr patch Place 1 patch (0.0375 mg total) onto the skin once a week. 12 patch 3  . estradiol (ESTRING) 2 MG vaginal ring Place 2 mg vaginally every 3 (three) months. Insert a new ring into vagina every 3 months 1 each 3  . fluticasone-salmeterol (ADVAIR HFA) 115-21 MCG/ACT inhaler Inhale 2 puffs into the lungs 2 (two) times daily.    Marland Kitchen levalbuterol (XOPENEX HFA) 45 MCG/ACT inhaler Inhale 1-2 puffs into the lungs every 4 (four) hours as needed.      . montelukast (SINGULAIR) 10 MG tablet Take 10 mg by mouth at bedtime.      . Omega-3 Fatty Acids (OMEGA 3 PO) Take 1 capsule by mouth daily.      Earney Navy Bicarbonate  (ZEGERID) 20-1100 MG CAPS capsule Take 1 capsule by mouth daily before breakfast.    . QNASL 80 MCG/ACT AERS Place 2 sprays into both nostrils daily.   5  . mirabegron ER (MYRBETRIQ) 25 MG TB24 tablet Take 1 tablet (25 mg total) by mouth daily. One po qd 30 tablet 2   No current facility-administered medications for this visit.     ALLERGIES: Codeine and Lipitor  Family History  Problem Relation Age of Onset  . Coronary artery disease Father   . Hypertension Father   . Heart disease Father   . Asthma Mother   . Hypertension Mother   . Alzheimer's disease Maternal Grandmother   . Lung cancer Maternal Grandfather   . Heart failure Paternal Grandfather   . Colon cancer Neg Hx   . Colon polyps Neg Hx   . Rectal cancer Neg Hx   . Stomach cancer Neg Hx   . Skin cancer Sister   . Hepatitis C Brother     History   Social History  . Marital Status: Married    Spouse Name: N/A  . Number of Children: 1  . Years of Education: N/A   Occupational History  . retired    Social History Main Topics  . Smoking status: Never Smoker   . Smokeless tobacco: Never Used  . Alcohol Use: 1.8 - 2.4 oz/week    3-4 Glasses of wine per week  . Drug Use: No  . Sexual Activity:    Partners: Male    Birth Control/ Protection: Surgical     Comment: Hyst   Other Topics Concern  . Not on file   Social History Narrative    ROS:  Pertinent items are noted in HPI.  PHYSICAL EXAMINATION:    BP 110/70 mmHg  Pulse 76  Ht 5\' 6"  (1.676 m)  Wt 156 lb 9.6 oz (71.033 kg)  BMI 25.29 kg/m2  LMP 11/07/1980    General appearance: alert, cooperative and appears stated age    Pelvic: External genitalia:  no lesions              Urethra:  normal appearing urethra with no masses, tenderness or lesions              Bartholins and Skenes: normal                 Vagina: normal appearing vagina with normal color and discharge, no lesions.  Good rugae.  Light pink color to  mucosa.              Cervix:  absent               Bimanual Exam:  Uterus:  uterus absent              Adnexa: no mass, fullness, tenderness              Chaperone was present for exam.  ASSESSMENT  Atrophic vaginitis improved with Estring. Overactive bladder.  PLAN  Continue Estring.  Discussed overactive bladder - avoidance of irritants, medical therapy, and pelvic floor therapy. Will try Myrbetriq 25 mg daily.  See orders.  Instructed in use.  Recheck in 6 weeks.   An After Visit Summary was printed and given to the patient.  __15____ minutes face to face time of which over 50% was spent in counseling.

## 2015-04-17 ENCOUNTER — Telehealth: Payer: Self-pay

## 2015-04-17 NOTE — Telephone Encounter (Signed)
Spoke with patient. Advised we have received a prior authorization request for Myrbetriq ER 25 mg tablet as prescribed by Dr.Silva on 04/16/2015. As this is her first rx for OAB insurance will not cover as they recommend she try alternatives first. Advised I have spoken with Dr.Miller who recommends she try OTC Oxytrol patches twice a week. Will apply patch to abdomen or buttocks and rotate location each time patch is changed. Aware side effects of medication include dry mouth and constipation. Patient is agreeable and verbalizes understanding. Will return call if symptoms persist with use.  Routing to provider for final review.

## 2015-05-05 NOTE — Telephone Encounter (Signed)
Appeal for Estring 2mg  has been approved by [facility] 05/01/2015-11/06/2038. Appeals approval signed by Dr.Silva and sent to scan. Patient has been notified and is agreeable.  Routing to provider for final review. Patient agreeable to disposition. Will close encounter.

## 2015-05-27 ENCOUNTER — Encounter: Payer: Self-pay | Admitting: Obstetrics and Gynecology

## 2015-05-27 ENCOUNTER — Ambulatory Visit (INDEPENDENT_AMBULATORY_CARE_PROVIDER_SITE_OTHER): Payer: BLUE CROSS/BLUE SHIELD | Admitting: Obstetrics and Gynecology

## 2015-05-27 VITALS — BP 130/84 | HR 70 | Ht 66.0 in | Wt 157.0 lb

## 2015-05-27 DIAGNOSIS — R8290 Unspecified abnormal findings in urine: Secondary | ICD-10-CM

## 2015-05-27 DIAGNOSIS — R6882 Decreased libido: Secondary | ICD-10-CM

## 2015-05-27 DIAGNOSIS — N3281 Overactive bladder: Secondary | ICD-10-CM

## 2015-05-27 LAB — POCT URINALYSIS DIPSTICK
BILIRUBIN UA: NEGATIVE
Blood, UA: NEGATIVE
Glucose, UA: NEGATIVE
Ketones, UA: NEGATIVE
LEUKOCYTES UA: NEGATIVE
Nitrite, UA: NEGATIVE
Protein, UA: NEGATIVE
UROBILINOGEN UA: NEGATIVE
pH, UA: 5

## 2015-05-27 NOTE — Progress Notes (Signed)
Patient ID: Lauren Montes, female   DOB: 1956/11/01, 59 y.o.   MRN: [id number] GYNECOLOGY  VISIT   HPI: 59 y.o.   Married  Caucasian  female   [id number] with Patient's last menstrual period was 11/07/1980.   here for follow up on overactive bladder.  Patient's insurance would not approve Myrbetriq so she is using Oxytrol OTC patch at this time.   Using the patch for 6 - 8 weeks.  NF is now 1 - 2 times now.  Was 2 -3 times per night.  DF is "not as bad." Would like to stay on the Oxytrol patch further.  Not having any palpitations.   Has not tried any other medications for overactive bladder.   On Estring and Climara patch 0.0375 mg.  Using Estring for 3 months now.  Vaginal irritation is now gone.   Sex drive is not where she would like it to be.  Knows that part of this is due to worry about pain with intercourse due to previous dryness.  Notes strong odor to urine.  No dysuria.  No vaginal drainage.  GYNECOLOGIC HISTORY: Patient's last menstrual period was 11/07/1980. Contraception: Hysterectomy Menopausal hormone therapy: Climara 0/0375mg , Estring 2mg  Last mammogram: 08-04-14 Density Cat:B/nl:[facility] Last pap smear: 03-06-15 wnl:neg HR HPV        OB History    Gravida Para Term Preterm AB TAB SAB Ectopic Multiple Living   1 1 1       1          Patient Active Problem List   Diagnosis Date Noted  . Palpitations 03/05/2015  . Fibromyalgia 03/05/2015  . Chronic diastolic CHF (congestive heart failure), NYHA class 2 06/20/2014  . Sinus tachycardia 09/14/2012  . Dysphagia 06/29/2012  . Ocular rosacea   . GERD (gastroesophageal reflux disease)   . Cough 09/09/2011  . Chest tightness, discomfort, or pressure 09/09/2011  . Hyperlipidemia 05/10/2011  . SWELLING OF LIMB 01/20/2011  . GERD 02/03/2010  . ABDOMINAL PAIN-EPIGASTRIC 02/03/2010  . ABDOMINAL PAIN-MULTIPLE SITES 02/03/2010  . CONSTIPATION 09/19/2008  . ABDOMINAL PAIN, LEFT LOWER QUADRANT 09/19/2008  .  COLONIC POLYPS, HYPERPLASTIC, HX OF 09/19/2008    Past Medical History  Diagnosis Date  . History of diastolic dysfunction   . Asthma   . History of migraine headaches   . Leg edema     left leg  . Tachycardia   . Cystic teratoma   . Elevated cholesterol   . Ocular rosacea   . GERD (gastroesophageal reflux disease)   . Endometriosis   . Blood transfusion without reported diagnosis 1975    with vaginal delivery--very anemic  . Overactive bladder   . Atrophic vaginitis     Past Surgical History  Procedure Laterality Date  . Foot surgery      LEFT FOOT  . Cardiovascular stress test  07/02/2008    EF 55-60%  . Appendectomy    . Bunionectomy      bilateral  . Left breast mass removed    . Rso  2002    partial  . Dental surgery  2007  . Ruptured disc surgery  07,08  . Back surgery    . Oophorectomy  1982    RSO partial and LSO, uterus  . Total abdominal hysterectomy    . Breast surgery  10-09-13    breast reduction--Dr. Dessie Coma    Current Outpatient Prescriptions  Medication Sig Dispense Refill  . ALREX 0.2 % SUSP Place 1 drop into  both eyes as needed.     . calcium-vitamin D (OSCAL WITH D) 500-200 MG-UNIT per tablet Take 1 tablet by mouth daily.     . cetirizine (ZYRTEC) 10 MG tablet Take 10 mg by mouth daily.    . CRESTOR 10 MG tablet TAKE 1 TABLET (10 MG TOTAL) BY MOUTH DAILY. 30 tablet 11  . cycloSPORINE (RESTASIS) 0.05 % ophthalmic emulsion Place 1 drop into both eyes 2 (two) times daily.    Marland Kitchen diltiazem (CARDIZEM CD) 120 MG 24 hr capsule Take 1 capsule (120 mg total) by mouth daily. 90 capsule 3  . estradiol (CLIMARA - DOSED IN MG/24 HR) 0.0375 mg/24hr patch Place 1 patch (0.0375 mg total) onto the skin once a week. 12 patch 3  . estradiol (ESTRING) 2 MG vaginal ring Place 2 mg vaginally every 3 (three) months. Insert a new ring into vagina every 3 months 1 each 3  . fluticasone-salmeterol (ADVAIR HFA) 115-21 MCG/ACT inhaler Inhale 2 puffs into the lungs 2 (two)  times daily.    Marland Kitchen levalbuterol (XOPENEX HFA) 45 MCG/ACT inhaler Inhale 1-2 puffs into the lungs every 4 (four) hours as needed.      . montelukast (SINGULAIR) 10 MG tablet Take 10 mg by mouth at bedtime.      . Omega-3 Fatty Acids (OMEGA 3 PO) Take 1 capsule by mouth daily.      Marland Kitchen oxybutynin (OXYTROL) 3.9 MG/24HR Place 1 patch onto the skin every 3 (three) days.    Marland Kitchen QNASL 80 MCG/ACT AERS Place 2 sprays into both nostrils daily.   5   No current facility-administered medications for this visit.     ALLERGIES: Codeine and Lipitor  Family History  Problem Relation Age of Onset  . Coronary artery disease Father   . Hypertension Father   . Heart disease Father   . Asthma Mother   . Hypertension Mother   . Alzheimer's disease Maternal Grandmother   . Lung cancer Maternal Grandfather   . Heart failure Paternal Grandfather   . Colon cancer Neg Hx   . Colon polyps Neg Hx   . Rectal cancer Neg Hx   . Stomach cancer Neg Hx   . Skin cancer Sister   . Hepatitis C Brother     History   Social History  . Marital Status: Married    Spouse Name: N/A  . Number of Children: 1  . Years of Education: N/A   Occupational History  . retired    Social History Main Topics  . Smoking status: Never Smoker   . Smokeless tobacco: Never Used  . Alcohol Use: 1.8 - 2.4 oz/week    3-4 Glasses of wine per week  . Drug Use: No  . Sexual Activity:    Partners: Male    Birth Control/ Protection: Surgical     Comment: Hyst   Other Topics Concern  . Not on file   Social History Narrative    ROS:  Pertinent items are noted in HPI.  PHYSICAL EXAMINATION:    BP 130/84 mmHg  Pulse 70  Ht 5\' 6"  (1.676 m)  Wt 157 lb (71.215 kg)  BMI 25.35 kg/m2  LMP 11/07/1980    General appearance: alert, cooperative and appears stated age   ASSESSMENT  Overactive bladder.  Urine odor.  Atrophic vaginal symptoms controlled on Estring.  ERT patient.  Decreased libido.  Hx palpitations.    PLAN  Counseled regarding Oxytrol.  3.9 mg twice weekly.  If this is not  effective and patient continuing to tolerate well but needing greater response, will treat with Detrol or Ditropan XL. Discussed avoiding bladder irritants. Will check urine culture due to the odo Continue Estring and Climara patch.  Discussed possible testosterone supplementation with testosterone propionate compounded.  She will consider.  Knows she will need to come in for a testosterone lab visit and brief visit prior to prescribing.   An After Visit Summary was printed and given to the patient.  ____25__ minutes face to face time of which over 50% was spent in counseling.

## 2015-05-27 NOTE — Patient Instructions (Signed)
The dose for the Oxytrol patch is 3.9 mg.  This is changed every 2 weeks.   Testosterone This test is used to determine if your testosterone level is abnormal. This could be used to explain difficulty getting an erection (erectile dysfunction), inability of your partner to get pregnant (infertility), premature or delayed puberty if you are female, or the appearance of masculine physical features if you are female. PREPARATION FOR TEST A blood sample is obtained by inserting a needle into a vein in the arm. NORMAL FINDINGS  Free Testosterone: 0.3-2 pg/mL  % Free Testosterone: 0.1%-0.3% Total Testosterone:  7 mos-9 yrs (Tanner Stage I)  Female: Less than 30 ng/dL  Female: Less than 30 ng/dL  10-13 yrs (Tanner Stage II)  Female: Less than 300 ng/dL  Female: Less than 40 ng/dL  14-15 yrs (Tanner Stage III)  Female: 170-540 ng/dL  Female: Less than 60 ng/dL  16-19 yrs (Tanner Stage IV, V)  Female: 250-910 ng/dL  Female: Less than 70 ng/dL  20 yrs and over  Female: [phone] ng/dL  Female: Less than 70 ng/dL Ranges for normal findings may vary among different laboratories and hospitals. You should always check with your doctor after having lab work or other tests done to discuss the meaning of your test results and whether your values are considered within normal limits. MEANING OF TEST  Your caregiver will go over the test results with you and discuss the importance and meaning of your results, as well as treatment options and the need for additional tests if necessary. OBTAINING THE TEST RESULTS It is your responsibility to obtain your test results. Ask the lab or department performing the test when and how you will get your results. Document Released: 11/10/2004 Document Revised: 01/16/2012 Document Reviewed: 02/19/2014 [facility] Patient Information 2015 [place], [place]. This information is not intended to replace advice given to you by your health care provider. Make sure you discuss  any questions you have with your health care provider.  Testosterone skin gel What is this medicine? TESTOSTERONE (tes TOS ter one) is the main female hormone. It supports normal female traits such as muscle growth, facial hair, and deep voice. This gel is used in males to treat low testosterone levels. This medicine may be used for other purposes; ask your health care provider or pharmacist if you have questions. COMMON BRAND NAME(S): AndroGel, FORTESTA, Testim, Vogelxo What should I tell my health care provider before I take this medicine? They need to know if you have any of these conditions: -breast cancer -diabetes -heart disease -if a female partner is pregnant or trying to get pregnant -kidney disease -liver disease -lung disease -prostate cancer, enlargement -an unusual or allergic reaction to testosterone, soy proteins, other medicines, foods, dyes, or preservatives -pregnant or trying to get pregnant -breast-feeding How should I use this medicine? This medicine is for external use only. This medicine is applied at the same time every day (preferably in the morning) to clean, dry, intact skin. If you take a bath or shower in the morning, apply the gel after the bath or shower. Follow the directions on the prescription label. Make sure that you are using your testosterone gel product correctly and applying it only to the appropriate skin area (see below). Allow the skin to dry a few minutes then cover with clothing to prevent others from coming in contact with the medicine on your skin. The gel is flammable. Avoid fire, flame, or smoking until the gel has dried. Wash your  hands with soap and water after use. For AndroGel 1% Packets: Open the packet(s) needed for your dose. You can put the entire dose into your palm all at once or just a little at a time to apply. If you prefer, you can instead squeeze the gel directly onto the area you are applying it to. Apply on the shoulders, upper arm,  or abdomen as directed. Do not apply to the scrotum or genitals. Be sure you use the correct total dose. It is best to wait 5 to 6 hours after application of the gel before showering or swimming. For AndroGel 1%: Pump the dose into the palm of your hand. You can put the entire dose into your palm all at once or just a little at a time to apply. If you prefer, you can instead pump the gel directly onto the area you are applying it to. Apply on the shoulders, upper arm, or abdomen as directed. Do not apply to the scrotum or genitals. Be sure you use the correct total dose. It is best to wait for 5 to 6 hours after application of the gel before showering or swimming. For Androgel 1.62% packets: Open the packet(s) needed for your dose. You can put the entire dose into your palm all at once or just a little at a time to apply. If you prefer, you can instead squeeze the gel directly onto the area you are applying it to. Apply on the shoulders and upper arms as directed. Do not apply to other parts of the body including the abdomen, genitals, chest, armpits, or knees. Be sure you use the correct total dose. It is best to wait 2 hours after application of the gel before washing, showering, or swimming. For AndroGel 1.62%: Pump the dose into the palm of your hand. Dispense one pump of gel at a time into the palm of your hand before applying it. If you prefer, you can instead pump the gel directly onto the area you are applying it to. Apply on the shoulders and upper arms as directed. Do not apply to other parts of the body including the abdomen, genitals, chest, armpits, or knees. Be sure you use the correct total dose. It is best to wait 2 hours after application of the gel before washing, showering, or swimming. For Testim: Open the tube(s) needed for your dose. Squeeze the gel from the tube into the palm of your hand. Apply on the shoulders or upper arms as directed. Do not apply to the scrotum, genitals, or abdomen.  Be sure you use the correct total dose. Do not shower or swim for at least 2 hours after application of the gel. For Fortesta: Use the multi-dose pump to pump the gel directly onto the area you are applying it to. Apply on the thighs as directed. Do not apply to the abdomen, penis, scrotum, shoulders or upper arms. Gently rub the gel onto the skin using your finger. Be sure you use the correct total dose. Do not shower or swim for at least 2 hours after application of the gel. A special MedGuide will be given to you by the pharmacist with each prescription and refill. Be sure to read this information carefully each time. Talk to your pediatrician regarding the use of this medicine in children. Special care may be needed. Overdosage: If you think you have taken too much of this medicine contact a poison control center or emergency room at once. NOTE: This medicine is only for  you. Do not share this medicine with others. What if I miss a dose? If you miss a dose, use it as soon as you can. If it is almost time for your next dose, use only that dose. Do not use double or extra doses. What may interact with this medicine? -medicines for diabetes -medicines that treat or prevent blood clots like warfarin -oxyphenbutazone -propranolol -steroid medicines like prednisone or cortisone This list may not describe all possible interactions. Give your health care provider a list of all the medicines, herbs, non-prescription drugs, or dietary supplements you use. Also tell them if you smoke, drink alcohol, or use illegal drugs. Some items may interact with your medicine. What should I watch for while using this medicine? Visit your doctor or health care professional for regular checks on your progress. They will need to check the level of testosterone in your blood. This medicine is only approved for use in men who have low levels of testosterone related to certain medical conditions. Heart attacks and strokes  have been reported with the use of this medicine. Notify your doctor or health care professional and seek emergency treatment if you develop breathing problems; changes in vision; confusion; chest pain or chest tightness; sudden arm pain; severe, sudden headache; trouble speaking or understanding; sudden numbness or weakness of the face, arm or leg; loss of balance or coordination. Talk to your doctor about the risks and benefits of this medicine. This medicine can transfer from your body to others. If a person or pet comes in contact with the area where this medicine was applied to your skin, they may have a serious risk of side effects. If you cannot avoid skin-to-skin contact with another person, make sure the site where this medicine was applied is covered with clothing. If accidental contact happens, the skin of the person or pet should be washed right away with soap and water. Also, a female partner who is pregnant or trying to get pregnant should avoid contact with the gel or treated skin. This medicine may affect blood sugar levels. If you have diabetes, check with your doctor or health care professional before you change your diet or the dose of your diabetic medicine. This drug is banned from use in athletes by most athletic organizations. What side effects may I notice from receiving this medicine? Side effects that you should report to your doctor or health care professional as soon as possible: -allergic reactions like skin rash, itching or hives, swelling of the face, lips, or tongue -breast enlargement -breathing problems -changes in mood, especially anger, depression, or rage -dark urine -general ill feeling or flu-like symptoms -light-colored stools -loss of appetite, nausea -nausea, vomiting -right upper belly pain -stomach pain -swelling of ankles -too frequent or persistent erections -trouble passing urine or change in the amount of urine -unusually weak or tired -yellowing  of the eyes or skin Side effects that usually do not require medical attention (report to your doctor or health care professional if they continue or are bothersome): -acne -change in sex drive or performance -hair loss -headache This list may not describe all possible side effects. Call your doctor for medical advice about side effects. You may report side effects to FDA at 1-800-FDA-1088. Where should I keep my medicine? Keep out of the reach of children. This medicine can be abused. Keep your medicine in a safe place to protect it from theft. Do not share this medicine with anyone. Selling or giving away this medicine is  dangerous and against the law. Store at room temperature between 15 to 30 degrees C (59 to 86 degrees F). Keep closed until use. Protect from heat and light. This medicine is flammable. Avoid exposure to heat, fire, flame, and smoking. Throw away any unused medicine after the expiration date. NOTE: This sheet is a summary. It may not cover all possible information. If you have questions about this medicine, talk to your doctor, pharmacist, or health care provider.  2015, Elsevier/Gold Standard. (2014-01-09 08:27:26)

## 2015-05-27 NOTE — Addendum Note (Signed)
Addended by: Charmayne Sheer on: 05/27/2015 02:53 PM   Modules accepted: Orders

## 2015-05-29 LAB — URINE CULTURE

## 2015-06-24 ENCOUNTER — Telehealth: Payer: Self-pay | Admitting: Cardiovascular Disease

## 2015-06-24 NOTE — Telephone Encounter (Signed)
Left message for patient to call back. Called patient's pharmacy about Crestor in generic form. Pharmacy stated that Crestor is available for most insurance in generic form. Patient's pharmacy had not put in patient's discount card and insurance, so now patient is able to get Crestor in generic form for under $4. Patient's pharmacy also stated that they would owe patient for pervious purchases. Will inform patient of these changes when she calls back.

## 2015-06-24 NOTE — Telephone Encounter (Signed)
New Message  Pt wanted to see if she can switch her Rx of Crestor to the generic alternative. Please call back and discuss.

## 2015-06-24 NOTE — Telephone Encounter (Signed)
Patient called back. Informed patient about note below and communication with her pharmacy. Patient was happy for the news and will be in touch with her pharmacy.

## 2015-06-25 ENCOUNTER — Ambulatory Visit (INDEPENDENT_AMBULATORY_CARE_PROVIDER_SITE_OTHER): Payer: BLUE CROSS/BLUE SHIELD | Admitting: Podiatry

## 2015-06-25 ENCOUNTER — Encounter: Payer: Self-pay | Admitting: Podiatry

## 2015-06-25 ENCOUNTER — Ambulatory Visit (INDEPENDENT_AMBULATORY_CARE_PROVIDER_SITE_OTHER): Payer: BLUE CROSS/BLUE SHIELD

## 2015-06-25 VITALS — BP 127/87 | HR 64 | Resp 12

## 2015-06-25 DIAGNOSIS — R52 Pain, unspecified: Secondary | ICD-10-CM | POA: Diagnosis not present

## 2015-06-25 DIAGNOSIS — M778 Other enthesopathies, not elsewhere classified: Secondary | ICD-10-CM

## 2015-06-25 DIAGNOSIS — L84 Corns and callosities: Secondary | ICD-10-CM | POA: Diagnosis not present

## 2015-06-25 DIAGNOSIS — M21621 Bunionette of right foot: Secondary | ICD-10-CM

## 2015-06-25 DIAGNOSIS — M7751 Other enthesopathy of right foot: Secondary | ICD-10-CM

## 2015-06-25 DIAGNOSIS — M205X1 Other deformities of toe(s) (acquired), right foot: Secondary | ICD-10-CM | POA: Diagnosis not present

## 2015-06-25 DIAGNOSIS — M779 Enthesopathy, unspecified: Secondary | ICD-10-CM

## 2015-06-25 NOTE — Progress Notes (Signed)
   Subjective:    Patient ID: Lauren Montes, female    DOB: 03/21/1956, 59 y.o.   MRN: [id number]  HPI : she presents today with chief complaint of a corn between her first and second digits for the past 6 months. She states the corn on the medial aspect of the second toe which she points and PIPJ is getting bigger over time and is becoming more and more painful. She states that with shoe gear this is excruciating and she can hardly stand. [name] tried nothing to treat it at home. She denies any trauma. She denies foreign bodies.    Review of Systems  Skin: Positive for color change.       Objective:   Physical Exam : I have reviewed her past medical history medications allergy surgery social history and review of systems. Pulses are strongly palpable. Neurologic sensorium is intact per Derrel Nip monofilament. Deep tendon reflexes are intact bilateral and muscle strength +5 over 5 dorsiflexion plantarflexion is inverters fevers onto the musculatures intact. Orthopedic evaluation demonstrates all joints distal to the ankle for range of motion without crepitation. She does have a very prominent base to the distal phalanx lateral hallux. Shows as a prominent medial PIPJ second digit right. This is resulting in a reactive hyperkeratotic lesion with an underlying bursitis. Radiographs confirm a surgical foot which has now resulted in spurring to the lateral aspect of the hallux and medial aspect of the second digit. These radiographs taken in the office today. Cutaneous evaluation demonstrates reactive hyperkeratosis with pain on palpation of this lesion.        Assessment & Plan:   assessment: capsulitis bursitis and porokeratosis medial aspect second digit right foot.  Plan: discussed etiology pathology conservative versus surgical therapies. At this point I injected the PIPJ and the bursal region with 2 mg of dexamethasone. Also debrided the reactive hyperkeratosis. I discussed surgical  intervention with her in great detail. I also dispensed padding to her today and I will follow-up with her as needed.

## 2015-07-09 ENCOUNTER — Other Ambulatory Visit: Payer: Self-pay

## 2015-07-09 DIAGNOSIS — Z1231 Encounter for screening mammogram for malignant neoplasm of breast: Secondary | ICD-10-CM

## 2015-07-14 ENCOUNTER — Other Ambulatory Visit: Payer: Self-pay | Admitting: Cardiovascular Disease

## 2015-07-20 ENCOUNTER — Encounter: Payer: Self-pay | Admitting: Obstetrics and Gynecology

## 2015-07-20 ENCOUNTER — Ambulatory Visit (INDEPENDENT_AMBULATORY_CARE_PROVIDER_SITE_OTHER): Payer: BLUE CROSS/BLUE SHIELD | Admitting: Obstetrics and Gynecology

## 2015-07-20 VITALS — BP 122/74 | HR 78 | Resp 16 | Wt 158.0 lb

## 2015-07-20 DIAGNOSIS — N760 Acute vaginitis: Secondary | ICD-10-CM | POA: Diagnosis not present

## 2015-07-20 MED ORDER — BETAMETHASONE VALERATE 0.1 % EX OINT: 1.0000 "application " | TOPICAL_OINTMENT | Freq: Two times a day (BID) | CUTANEOUS | Status: DC

## 2015-07-20 NOTE — Progress Notes (Signed)
Patient ID: Lauren Montes, female   DOB: [birth year], 59 y.o.   MRN: [id number] GYNECOLOGY  VISIT   HPI: 59 y.o.   Married  Caucasian  female   [id number] with Patient's last menstrual period was 11/07/1980.   here c/o vaginal itching, burning and discharge x 3 weeks. She used MEtrol gel for 5 days which seemed to helping some but has since gotten worse. At night the itching is severe. D/C is white and cheesy. She removed her vaginal ring over the weekend, lots of thick, clumpy d/c. She used metrogel that she had at home 2 weeks ago and again 2 nights ago. No vaginal odor.   GYNECOLOGIC HISTORY: Patient's last menstrual period was 11/07/1980. Contraception:Hysterectomy  Menopausal hormone therapy: estradiol         OB History    Gravida Para Term Preterm AB TAB SAB Ectopic Multiple Living   1 1 1       1          Patient Active Problem List   Diagnosis Date Noted  . Palpitations 03/05/2015  . Fibromyalgia 03/05/2015  . Chronic diastolic CHF (congestive heart failure), NYHA class 2 06/20/2014  . Sinus tachycardia 09/14/2012  . Dysphagia 06/29/2012  . Ocular rosacea   . GERD (gastroesophageal reflux disease)   . Cough 09/09/2011  . Chest tightness, discomfort, or pressure 09/09/2011  . Hyperlipidemia 05/10/2011  . SWELLING OF LIMB 01/20/2011  . GERD 02/03/2010  . ABDOMINAL PAIN-EPIGASTRIC 02/03/2010  . ABDOMINAL PAIN-MULTIPLE SITES 02/03/2010  . CONSTIPATION 09/19/2008  . ABDOMINAL PAIN, LEFT LOWER QUADRANT 09/19/2008  . COLONIC POLYPS, HYPERPLASTIC, HX OF 09/19/2008    Past Medical History  Diagnosis Date  . History of diastolic dysfunction   . Asthma   . History of migraine headaches   . Leg edema     left leg  . Tachycardia   . Cystic teratoma   . Elevated cholesterol   . Ocular rosacea   . GERD (gastroesophageal reflux disease)   . Endometriosis   . Blood transfusion without reported diagnosis 1975    with vaginal delivery--very anemic  . Overactive bladder   .  Atrophic vaginitis     Past Surgical History  Procedure Laterality Date  . Foot surgery      LEFT FOOT  . Cardiovascular stress test  07/02/2008    EF 55-60%  . Appendectomy    . Bunionectomy      bilateral  . Left breast mass removed    . Rso  2002    partial  . Dental surgery  2007  . Ruptured disc surgery  07,08  . Back surgery    . Oophorectomy  1982    RSO partial and LSO, uterus  . Total abdominal hysterectomy    . Breast surgery  10-09-13    breast reduction--Dr. Dessie Coma    Current Outpatient Prescriptions  Medication Sig Dispense Refill  . ALREX 0.2 % SUSP Place 1 drop into both eyes as needed.     . calcium-vitamin D (OSCAL WITH D) 500-200 MG-UNIT per tablet Take 1 tablet by mouth daily.     . cetirizine (ZYRTEC) 10 MG tablet Take 10 mg by mouth daily.    . CRESTOR 10 MG tablet TAKE 1 TABLET (10 MG TOTAL) BY MOUTH DAILY. 30 tablet 11  . cycloSPORINE (RESTASIS) 0.05 % ophthalmic emulsion Place 1 drop into both eyes 2 (two) times daily.    Marland Kitchen diltiazem (CARDIZEM CD) 120 MG 24 hr capsule TAKE 1  CAPSULE (120 MG TOTAL) BY MOUTH DAILY. 90 capsule 0  . estradiol (CLIMARA - DOSED IN MG/24 HR) 0.0375 mg/24hr patch Place 1 patch (0.0375 mg total) onto the skin once a week. 12 patch 3  . estradiol (ESTRING) 2 MG vaginal ring Place 2 mg vaginally every 3 (three) months. Insert a new ring into vagina every 3 months 1 each 3  . fluticasone-salmeterol (ADVAIR HFA) 115-21 MCG/ACT inhaler Inhale 2 puffs into the lungs 2 (two) times daily.    Marland Kitchen levalbuterol (XOPENEX HFA) 45 MCG/ACT inhaler Inhale 1-2 puffs into the lungs every 4 (four) hours as needed.      Marland Kitchen LYRICA 50 MG capsule Take 100 mg by mouth at bedtime.  3  . montelukast (SINGULAIR) 10 MG tablet Take 10 mg by mouth at bedtime.      . Omega-3 Fatty Acids (OMEGA 3 PO) Take 1 capsule by mouth daily.      Marland Kitchen oxybutynin (OXYTROL) 3.9 MG/24HR Place 1 patch onto the skin every 3 (three) days.    Marland Kitchen QNASL 80 MCG/ACT AERS Place 2 sprays  into both nostrils daily.   5  . tiZANidine (ZANAFLEX) 4 MG tablet TAKE 1 TABLET BY MOUTH AT BEDTIME AS NEEDED FOR MUSCLE SPASM  2   No current facility-administered medications for this visit.     ALLERGIES: Codeine and Lipitor  Family History  Problem Relation Age of Onset  . Coronary artery disease Father   . Hypertension Father   . Heart disease Father   . Asthma Mother   . Hypertension Mother   . Alzheimer's disease Maternal Grandmother   . Lung cancer Maternal Grandfather   . Heart failure Paternal Grandfather   . Colon cancer Neg Hx   . Colon polyps Neg Hx   . Rectal cancer Neg Hx   . Stomach cancer Neg Hx   . Skin cancer Sister   . Hepatitis C Brother     Social History   Social History  . Marital Status: Married    Spouse Name: N/A  . Number of Children: 1  . Years of Education: N/A   Occupational History  . retired    Social History Main Topics  . Smoking status: Never Smoker   . Smokeless tobacco: Never Used  . Alcohol Use: 1.8 - 2.4 oz/week    3-4 Glasses of wine per week  . Drug Use: No  . Sexual Activity:    Partners: Male    Birth Control/ Protection: Surgical     Comment: Hyst   Other Topics Concern  . Not on file   Social History Narrative    Review of Systems  Constitutional: Negative.   HENT: Negative.   Eyes: Negative.   Respiratory: Negative.   Cardiovascular: Negative.   Gastrointestinal: Negative.   Genitourinary:       Vaginal itching,discharge and burning  Musculoskeletal: Negative.   Skin: Negative.   Neurological: Negative.   Endo/Heme/Allergies: Negative.   Psychiatric/Behavioral: Negative.     PHYSICAL EXAMINATION:    BP 122/74 mmHg  Pulse 78  Resp 16  Wt 158 lb (71.668 kg)  LMP 11/07/1980    General appearance: alert, cooperative and appears stated age  Pelvic: External genitalia:  no lesions              Urethra:  normal appearing urethra with no masses, tenderness or lesions              Bartholins and  Skenes: normal  Vagina: normal appearing vagina with normal color some thick white vaginal d/c              Cervix: absent               Chaperone was present for exam.  Wet prep: no clue, some artifact seen, no trich, few WBC KOH: no yeast PH: 4  ASSESSMENT  Vulvo-vaginitis, negative vaginal slides, but she just used the metrogel 2 days ago, some artifact seen    PLAN Send wet prep probe Treat with steroid ointment Further treatment depending on results   An After Visit Summary was printed and given to the patient.

## 2015-07-21 LAB — WET PREP BY MOLECULAR PROBE
CANDIDA SPECIES: NEGATIVE
GARDNERELLA VAGINALIS: NEGATIVE
Trichomonas vaginosis: NEGATIVE

## 2015-08-25 ENCOUNTER — Ambulatory Visit
Admission: RE | Admit: 2015-08-25 | Discharge: 2015-08-25 | Disposition: A | Discharge status: Home / Self Care | Payer: BLUE CROSS/BLUE SHIELD | Source: Ambulatory Visit

## 2015-08-25 DIAGNOSIS — Z1231 Encounter for screening mammogram for malignant neoplasm of breast: Secondary | ICD-10-CM

## 2015-09-24 ENCOUNTER — Encounter: Payer: Self-pay | Admitting: Cardiovascular Disease

## 2015-09-24 ENCOUNTER — Ambulatory Visit (INDEPENDENT_AMBULATORY_CARE_PROVIDER_SITE_OTHER): Payer: BLUE CROSS/BLUE SHIELD | Admitting: Cardiovascular Disease

## 2015-09-24 VITALS — BP 150/112 | HR 72 | Ht 66.0 in | Wt 158.4 lb

## 2015-09-24 DIAGNOSIS — I10 Essential (primary) hypertension: Secondary | ICD-10-CM | POA: Insufficient documentation

## 2015-09-24 DIAGNOSIS — R002 Palpitations: Secondary | ICD-10-CM | POA: Diagnosis not present

## 2015-09-24 DIAGNOSIS — R252 Cramp and spasm: Secondary | ICD-10-CM

## 2015-09-24 DIAGNOSIS — E785 Hyperlipidemia, unspecified: Secondary | ICD-10-CM

## 2015-09-24 LAB — HEPATIC FUNCTION PANEL
ALBUMIN: 4.1 g/dL (ref 3.6–5.1)
ALK PHOS: 53 U/L (ref 33–130)
ALT: 22 U/L (ref 6–29)
AST: 23 U/L (ref 10–35)
BILIRUBIN TOTAL: 0.5 mg/dL (ref 0.2–1.2)
Bilirubin, Direct: <0.1 mg/dL (ref <=0.2)
TOTAL PROTEIN: 6.2 g/dL (ref 6.1–8.1)

## 2015-09-24 LAB — LIPID PANEL
CHOL/HDL RATIO: 2.4 ratio (ref <=5.0)
Cholesterol: 140 mg/dL (ref 125–200)
HDL: 58 mg/dL (ref >=46)
LDL Cholesterol: 65 mg/dL (ref <130)
Triglycerides: 86 mg/dL (ref <150)
VLDL: 17 mg/dL (ref <30)

## 2015-09-24 LAB — BASIC METABOLIC PANEL
BUN: 11 mg/dL (ref 7–25)
CALCIUM: 9.1 mg/dL (ref 8.6–10.4)
CO2: 25 mmol/L (ref 20–31)
CREATININE: 0.66 mg/dL (ref 0.50–1.05)
Chloride: 106 mmol/L (ref 98–110)
Glucose, Bld: 88 mg/dL (ref 65–99)
Potassium: 4 mmol/L (ref 3.5–5.3)
SODIUM: 142 mmol/L (ref 135–146)

## 2015-09-24 LAB — TSH: TSH: 2.036 u[IU]/mL (ref 0.350–4.500)

## 2015-09-24 LAB — MAGNESIUM: MAGNESIUM: 1.9 mg/dL (ref 1.5–2.5)

## 2015-09-24 MED ORDER — PROPRANOLOL HCL 10 MG PO TABS: 10.0000 mg | ORAL_TABLET | Freq: Three times a day (TID) | ORAL | Status: DC

## 2015-09-24 MED ORDER — DILTIAZEM HCL ER COATED BEADS 120 MG PO CP24: 120.0000 mg | ORAL_CAPSULE | Freq: Every day | ORAL | Status: DC

## 2015-09-24 NOTE — Progress Notes (Signed)
Lauren Montes Date of Birth  1956-01-16 [facility] / [facility] [id number] [address]     [place], Lauren Montes  [address] [phone]  Fax  [phone]  1. Hyperlipidemia 2. Sinus tachycardia 3. Chronic asthma  History of Present Illness:  Lauren Montes is a middle-age female the history of mild diastolic dysfunction as well as leg edema. She also has a history of hypercholesterolemia.   Has a history of hyperlipidemia. We have had her on Crestor the past which she tolerated very well. Her insurance and he refused to pay for to try her on atorvastatin. This caused her to have lots of muscle aches as well as memory loss. We discontinued the atorvastatin at that time.  She has been recording her heart rate and blood pressure readings. Her heart rate is frequently above 100. He also has many readings in the 80-90 range.  Has bruising particularly on her hands and on her fingers. [name] never had any easy bleeding. She did not have any bleeding  complications. during her pregnancies or deliveries.  Feb. 4, 2014: [name] is doing well.  Her HR and BP have been well controlled.   June 17, 2013:  [name] is doing well.  She has started waking over the past several weeks.    June 21, 2014:  [name] is having some issues with DOE.    Perhaps slight chest pressure with exertion.  No pain. .  Sleeps on 2 pillows occasionally at night - not every night.  [name] very careful with her salt intake. She and her husband thinks most of her meals of scratch. She denies any leg swelling   Nov. 17, 2015:  [name] is doing well.  She is seen for chronic dyspnea. Her echo was normal.  myoview showed no ischemia Occasional heart racing.  Not severe.  Does not take any meds.  Resolves after several minutes. She is walking for exercise - 30-45 minutes 3 times a week.  Also do some strength training  Nov. 17, 2016: Overall doing ok Lots of stress - aging parents  Lots of  muscle cramps in legs and feet.     Current Outpatient Prescriptions  Medication Sig Dispense Refill  . ARNUITY ELLIPTA 200 MCG/ACT AEPB Inhale 1 puff into the lungs daily.  5  . cetirizine (ZYRTEC) 10 MG tablet Take 10 mg by mouth daily.    . CRESTOR 10 MG tablet TAKE 1 TABLET (10 MG TOTAL) BY MOUTH DAILY. 30 tablet 11  . cycloSPORINE (RESTASIS) 0.05 % ophthalmic emulsion Place 1 drop into both eyes 2 (two) times daily.    Marland Kitchen diltiazem (CARDIZEM CD) 120 MG 24 hr capsule TAKE 1 CAPSULE (120 MG TOTAL) BY MOUTH DAILY. 90 capsule 0  . estradiol (CLIMARA - DOSED IN MG/24 HR) 0.0375 mg/24hr patch Place 1 patch (0.0375 mg total) onto the skin once a week. 12 patch 3  . estradiol (ESTRING) 2 MG vaginal ring Place 2 mg vaginally every 3 (three) months. Insert a new ring into vagina every 3 months 1 each 3  . LYRICA 50 MG capsule Take 100 mg by mouth at bedtime.  3  . montelukast (SINGULAIR) 10 MG tablet Take 10 mg by mouth at bedtime.      . Omega-3 Fatty Acids (OMEGA 3 PO) Take 1 capsule by mouth daily.      Marland Kitchen PROAIR HFA 108 (90 BASE) MCG/ACT inhaler Inhale 2 puffs into the lungs every 6 (six) hours as needed for wheezing  or shortness of breath.   11  . QNASL 80 MCG/ACT AERS Place 2 sprays into both nostrils daily.   5  . tiZANidine (ZANAFLEX) 4 MG tablet TAKE 1 TABLET BY MOUTH AT BEDTIME AS NEEDED FOR MUSCLE SPASM  2   No current facility-administered medications for this visit.     Allergies  Allergen Reactions  . Codeine Nausea And Vomiting    Migraine headache  . Lipitor [Atorvastatin] Other (See Comments)    Muscle ache, mind foggy,     Past Medical History  Diagnosis Date  . History of diastolic dysfunction   . Asthma   . History of migraine headaches   . Leg edema     left leg  . Tachycardia   . Cystic teratoma   . Elevated cholesterol   . Ocular rosacea   . GERD (gastroesophageal reflux disease)   . Endometriosis   . Blood transfusion without reported diagnosis 1975     with vaginal delivery--very anemic  . Overactive bladder   . Atrophic vaginitis     Past Surgical History  Procedure Laterality Date  . Foot surgery      LEFT FOOT  . Cardiovascular stress test  07/02/2008    EF 55-60%  . Appendectomy    . Bunionectomy      bilateral  . Left breast mass removed    . Rso  2002    partial  . Dental surgery  2007  . Ruptured disc surgery  07,08  . Back surgery    . Oophorectomy  1982    RSO partial and LSO, uterus  . Total abdominal hysterectomy    . Breast surgery  10-09-13    breast reduction--Dr. Holderness    History  Smoking status  . Never Smoker   Smokeless tobacco  . Never Used    History  Alcohol Use  . 1.8 - 2.4 oz/week  . 3-4 Glasses of wine per week    Family History  Problem Relation Age of Onset  . Coronary artery disease Father   . Hypertension Father   . Heart disease Father   . Asthma Mother   . Hypertension Mother   . Alzheimer's disease Maternal Grandmother   . Lung cancer Maternal Grandfather   . Heart failure Paternal Grandfather   . Colon cancer Neg Hx   . Colon polyps Neg Hx   . Rectal cancer Neg Hx   . Stomach cancer Neg Hx   . Skin cancer Sister   . Hepatitis C Brother     Reviw of Systems:  Reviewed in the HPI.  All other systems are negative.  Physical Exam: BP 150/112 mmHg  Pulse 72  Ht 5\' 6"  (1.676 m)  Wt 158 lb 6.4 oz (71.85 kg)  BMI 25.58 kg/m2  SpO2 99%  LMP 11/07/1980 The patient is alert and oriented x 3.  The mood and affect are normal.   Skin: warm and dry.  Color is normal.    HEENT:   the sclera are nonicteric.  The mucous membranes are moist.  The carotids are 2+ without bruits.  There is no thyromegaly.  There is no JVD.    Lungs: clear.  The chest wall is non tender.    Heart: regular rate with a normal S1 and S2.  There are no murmurs, gallops, or rubs. The PMI is not displaced.     Abdomin: good bowel sounds.  There is no guarding or rebound.  There is no  hepatosplenomegaly or tenderness.  There are no masses.   Extremities:  Trace edema.  The legs are without rashes.  The distal pulses are intact.   Neuro:  Cranial nerves II - XII are intact.  Motor and sensory functions are intact.    The gait is normal.  ECG:  Assessment / Plan:   1. Essential hypertension: Her blood pressure is fairly elevated today although her readings have been normal this past week. She does not think that she's been eating any extra salt. She'll continue to record her blood pressure on a regular basis and will call us if her readings are elevated.   2. Muscle cramps:  We will hold Crestor for a couple of weeks to see if her muscle cramps improved. We'll check labs including basic medical profile, magnesium, liver enzymes, total CPK, TSH, and liver enzymes.  3. Supraventricular tachycardia: Stable.    Nahser, Wonda Cheng, MD  09/24/2015 11:59 AM    [address],  [place], [place]  [address] Pager [phone] Phone: [phone]; Fax: [phone]   [facility]  [address] [place], [place]  [address] [phone]   Fax [phone]

## 2015-09-24 NOTE — Patient Instructions (Signed)
Medication Instructions:  HOLD Crestor for 2-4 weeks to see if muscle cramps resolve TAKE Propranolol 10 mg up to 4 times per day as needed for palpitations   Labwork: TODAY - basic metabolic panel, magnesium, TSH, liver panel, lipid panel, CPK - total   Testing/Procedures: None Ordered   Follow-Up: Your physician recommends that you schedule a follow-up appointment in: 3 months with Dr. Acie Fredrickson   If you need a refill on your cardiac medications before your next appointment, please call your pharmacy.   Thank you for choosing [facility]! Christen Bame, RN [phone]

## 2015-09-28 LAB — CK TOTAL AND CKMB (NOT AT ARMC): CK TOTAL: 73 U/L (ref 7–177)

## 2015-10-12 ENCOUNTER — Telehealth: Payer: Self-pay | Admitting: Cardiovascular Disease

## 2015-10-12 NOTE — Telephone Encounter (Signed)
Spoke with patient and reviewed Dr. Elmarie Shiley advice regarding options for managing hyperlipidemia.  Patient states she notes muscle cramps with just 1 day of Crestor.  She requests appointment in lipid clinic.  Patient scheduled for 12/12 in lipid clinic. She reports crestor last taken Wed. 11/30.  She thanked me for the call.

## 2015-10-12 NOTE — Telephone Encounter (Signed)
New message      Pt stopped crestor for 1.5 weeks; muscle cramps improved while off medication.  Do you want to change her medication?

## 2015-10-12 NOTE — Telephone Encounter (Signed)
We can consider the 2 following options.   1.   have her try Crestor 10 mg once a week.   2. Refer to lipid clinic to be considered for injectable cholesterol med  We can also do a coronary calcium score to determine just how aggressive we need to be.    Lets have her try the crestor once a week and see if she tolerates that

## 2015-10-19 ENCOUNTER — Ambulatory Visit (INDEPENDENT_AMBULATORY_CARE_PROVIDER_SITE_OTHER): Payer: BLUE CROSS/BLUE SHIELD | Admitting: Pharmacist

## 2015-10-19 DIAGNOSIS — E785 Hyperlipidemia, unspecified: Secondary | ICD-10-CM | POA: Diagnosis not present

## 2015-10-19 MED ORDER — PRAVASTATIN SODIUM 20 MG PO TABS: 20.0000 mg | ORAL_TABLET | Freq: Every evening | ORAL | Status: DC

## 2015-10-19 NOTE — Progress Notes (Signed)
Patient ID:  Lauren Montes                DOB:  07-18-2056, female             MRN: [id number]     HPI: Lauren Montes is a very pleasant 59 y.o. female patient referred to lipid clinic by Dr. Acie Fredrickson. PMH is significant for HLD, sinus tachycardia, and chronic asthma. She has tried both Lipitor and Crestor in the past but developed myalgias. She has some family history of early cardiovascular disease and presents to lipid clinic for lipid management.  Pt reports that she tolerated Crestor for multiple years before developing myalgias. Pt saw Dr. Acie Fredrickson last month and complained of myalgias. She d/ced Crestor 10mg  daily and today reports that her myalgias improved greatly when she discontinued her Crestor.  Current Medications: None Intolerances: Lipitor 20mg  daily, Crestor 5-10mg  daily Risk Factors: Family history of cardiac disease LDL goal: 100mg /dL for primary prevention with risk factors  Diet:  Breakfast: cheerios or Total cereal with fruit Lunch: apple or [place] sandwich Dinner: Porkchop, steak, eggs with brussel sprouts, rice and veggies with chicken. Limits red meat to 1x a week Snacks: nuts, fruit, popcorn  Exercise: Goes to the gym 2-3x a week - uses the treadmill, strength training with a trainer. Goes to a yoga class too. Walks her dog regularly.  Family History: Father has CAD, heart disease, s/p MI. Patient reports that he started having heart problems in his 40s-50s. Her 3 siblings also have lipid issues.  Social History: Patient reports that she drinks about 3 glasses of wine a week. She reports that she has never smoked and does not use drugs.  Labs: 09/2015: TC 140, TG 86, HDL 58, LDL 65, LFTs wnl (Crestor 10mg  daily)  Past Medical History  Diagnosis Date  . History of diastolic dysfunction   . Asthma   . History of migraine headaches   . Leg edema     left leg  . Tachycardia   . Cystic teratoma   . Elevated cholesterol   . Ocular rosacea   . GERD  (gastroesophageal reflux disease)   . Endometriosis   . Blood transfusion without reported diagnosis 1975    with vaginal delivery--very anemic  . Overactive bladder   . Atrophic vaginitis     Current Outpatient Prescriptions on File Prior to Visit  Medication Sig Dispense Refill  . ARNUITY ELLIPTA 200 MCG/ACT AEPB Inhale 1 puff into the lungs daily.  5  . cetirizine (ZYRTEC) 10 MG tablet Take 10 mg by mouth daily.    . CRESTOR 10 MG tablet TAKE 1 TABLET (10 MG TOTAL) BY MOUTH DAILY. 30 tablet 11  . cycloSPORINE (RESTASIS) 0.05 % ophthalmic emulsion Place 1 drop into both eyes 2 (two) times daily.    Marland Kitchen diltiazem (CARDIZEM CD) 120 MG 24 hr capsule Take 1 capsule (120 mg total) by mouth daily. 90 capsule 3  . estradiol (CLIMARA - DOSED IN MG/24 HR) 0.0375 mg/24hr patch Place 1 patch (0.0375 mg total) onto the skin once a week. 12 patch 3  . estradiol (ESTRING) 2 MG vaginal ring Place 2 mg vaginally every 3 (three) months. Insert a new ring into vagina every 3 months 1 each 3  . LYRICA 50 MG capsule Take 100 mg by mouth at bedtime.  3  . montelukast (SINGULAIR) 10 MG tablet Take 10 mg by mouth at bedtime.      . Omega-3 Fatty Acids (OMEGA 3  PO) Take 1 capsule by mouth daily.      Marland Kitchen PROAIR HFA 108 (90 BASE) MCG/ACT inhaler Inhale 2 puffs into the lungs every 6 (six) hours as needed for wheezing or shortness of breath.   11  . propranolol (INDERAL) 10 MG tablet Take 1 tablet (10 mg total) by mouth 3 (three) times daily. 60 tablet 6  . QNASL 80 MCG/ACT AERS Place 2 sprays into both nostrils daily.   5  . tiZANidine (ZANAFLEX) 4 MG tablet TAKE 1 TABLET BY MOUTH AT BEDTIME AS NEEDED FOR MUSCLE SPASM  2   No current facility-administered medications on file prior to visit.    Allergies  Allergen Reactions  . Codeine Nausea And Vomiting    Migraine headache  . Lipitor [Atorvastatin] Other (See Comments)    Muscle ache, mind foggy,     Assessment/Plan:  1. Hyperlipidemia - Patient's LDL  most recently at goal 100mg /dL although she was still taking Crestor 10mg  daily when labs were drawn. Pt is intolerant to Lipitor and Crestor with myalgias to both that resolved upon discontinuation of statin therapy. Discussed 2 options with patient - either a trial of pravastatin or Zetia. For cost reasons, patient would like to try pravastatin. Will start pravastatin 20mg  every evening. Pt advised to call clinic if she cannot tolerate pravastatin. At that time, would try Zetia. Zetia will be going generic this month and price is expected to drop in the next 6 months. Pt will f/u with Dr. Acie Fredrickson in February as scheduled.    Megan E. Supple, PharmD [place] [id number] [address] [place], [place] [address] Phone: [phone]; Fax: [phone] 10/19/2015 3:07 PM

## 2015-10-19 NOTE — Patient Instructions (Signed)
Please pick up your prescription for pravastatin 20mg  to start taking 1 tablet every evening Call Abimbola Aki in lipid clinic at [phone] if you cannot tolerate the pravastatin Follow up with Dr. Acie Fredrickson as scheduled

## 2015-12-29 ENCOUNTER — Telehealth: Payer: Self-pay | Admitting: Pharmacist

## 2015-12-29 DIAGNOSIS — E785 Hyperlipidemia, unspecified: Secondary | ICD-10-CM

## 2015-12-29 MED ORDER — EZETIMIBE 10 MG PO TABS: 10.0000 mg | ORAL_TABLET | Freq: Every day | ORAL | Status: DC

## 2015-12-29 MED ORDER — ROSUVASTATIN CALCIUM 5 MG PO TABS: ORAL_TABLET | ORAL | Status: DC

## 2015-12-29 NOTE — Telephone Encounter (Signed)
Pt called to tell us lipid lab results from her recent appt with Dr. Osborne Casco. TC 228, TG 120, HDL 39, LDL 165. She is not tolerating her pravastatin and has not taken it for some time. LDL goal <100 for primary prevention with risk factors. She will not qualify for PCSK9i therapy since she has no ASCVD or FH. Will have pt start Zetia 10mg  daily and if she tolerates it after a few weeks, start Crestor 5mg  2-3x/week as tolerated. Will f/u with lipid panel in 3 months.

## 2015-12-30 ENCOUNTER — Encounter: Payer: Self-pay | Admitting: Obstetrics and Gynecology

## 2015-12-30 ENCOUNTER — Ambulatory Visit (INDEPENDENT_AMBULATORY_CARE_PROVIDER_SITE_OTHER): Payer: BLUE CROSS/BLUE SHIELD | Admitting: Obstetrics and Gynecology

## 2015-12-30 VITALS — BP 120/76 | HR 66 | Resp 12 | Wt 150.0 lb

## 2015-12-30 DIAGNOSIS — N949 Unspecified condition associated with female genital organs and menstrual cycle: Secondary | ICD-10-CM | POA: Diagnosis not present

## 2015-12-30 LAB — POCT URINALYSIS DIPSTICK
Bilirubin, UA: NEGATIVE
GLUCOSE UA: NEGATIVE
Ketones, UA: NEGATIVE
Leukocytes, UA: NEGATIVE
NITRITE UA: NEGATIVE
PROTEIN UA: NEGATIVE
RBC UA: NEGATIVE
UROBILINOGEN UA: NEGATIVE
pH, UA: 5

## 2015-12-30 MED ORDER — FLUCONAZOLE 150 MG PO TABS: 150.0000 mg | ORAL_TABLET | Freq: Once | ORAL | Status: DC

## 2015-12-30 NOTE — Progress Notes (Signed)
GYNECOLOGY  VISIT   HPI: 60 y.o.   Married white  female   G1P1001 with Patient's last menstrual period was 11/07/1980.   here for vaginal burning and itching since approx 2 weeks and gradually getting worse,  Patient removed Estring last night due to pain. Removal of the ring made it feel better.  No vaginal odor.  Some mild discharge.  Tx for otitis media just before she traveled.  She was treated with a cephalosporin. Used Monistat externally.   Traveled for 2 weeks in [place] and [place].  Returned 2 weeks ago.  Urine dip - negative.   GYNECOLOGIC HISTORY: Patient's last menstrual period was 11/07/1980. Contraception:Hysterectomy Menopausal hormone therapy: Estring, Patch Last mammogram: 08/25/15 BIRADS Category 1 Negative Last pap smear: 03/06/15 Neg HR HPV Neg        OB History    Gravida Para Term Preterm AB TAB SAB Ectopic Multiple Living   1 1 1       1          Patient Active Problem List   Diagnosis Date Noted  . HTN (hypertension) 09/24/2015  . Palpitations 03/05/2015  . Fibromyalgia 03/05/2015  . Chronic diastolic CHF (congestive heart failure), NYHA class 2 ([place]) 06/20/2014  . Sinus tachycardia ([place]) 09/14/2012  . Dysphagia 06/29/2012  . Ocular rosacea   . GERD (gastroesophageal reflux disease)   . Cough 09/09/2011  . Chest tightness, discomfort, or pressure 09/09/2011  . Hyperlipidemia 05/10/2011  . SWELLING OF LIMB 01/20/2011  . GERD 02/03/2010  . ABDOMINAL PAIN-EPIGASTRIC 02/03/2010  . ABDOMINAL PAIN-MULTIPLE SITES 02/03/2010  . CONSTIPATION 09/19/2008  . ABDOMINAL PAIN, LEFT LOWER QUADRANT 09/19/2008  . COLONIC POLYPS, HYPERPLASTIC, HX OF 09/19/2008    Past Medical History  Diagnosis Date  . History of diastolic dysfunction   . Asthma   . History of migraine headaches   . Leg edema     left leg  . Tachycardia   . Cystic teratoma   . Elevated cholesterol   . Ocular rosacea   . GERD (gastroesophageal reflux disease)   . Endometriosis   .  Blood transfusion without reported diagnosis 1975    with vaginal delivery--very anemic  . Overactive bladder   . Atrophic vaginitis     Past Surgical History  Procedure Laterality Date  . Foot surgery      LEFT FOOT  . Cardiovascular stress test  07/02/2008    EF 55-60%  . Appendectomy    . Bunionectomy      bilateral  . Left breast mass removed    . Rso  2002    partial  . Dental surgery  2007  . Ruptured disc surgery  07,08  . Back surgery    . Oophorectomy  1982    RSO partial and LSO, uterus  . Total abdominal hysterectomy    . Breast surgery  10-09-13    breast reduction--Dr. Dessie Coma    Current Outpatient Prescriptions  Medication Sig Dispense Refill  . ARNUITY ELLIPTA 200 MCG/ACT AEPB Inhale 1 puff into the lungs daily.  5  . cetirizine (ZYRTEC) 10 MG tablet Take 10 mg by mouth daily.    . cycloSPORINE (RESTASIS) 0.05 % ophthalmic emulsion Place 1 drop into both eyes 2 (two) times daily.    Marland Kitchen diltiazem (CARDIZEM CD) 120 MG 24 hr capsule Take 1 capsule (120 mg total) by mouth daily. 90 capsule 3  . estradiol (CLIMARA - DOSED IN MG/24 HR) 0.0375 mg/24hr patch Place 1 patch (0.0375 mg  total) onto the skin once a week. 12 patch 3  . estradiol (ESTRING) 2 MG vaginal ring Place 2 mg vaginally every 3 (three) months. Insert a new ring into vagina every 3 months 1 each 3  . ezetimibe (ZETIA) 10 MG tablet Take 1 tablet (10 mg total) by mouth daily. 30 tablet 11  . montelukast (SINGULAIR) 10 MG tablet Take 10 mg by mouth at bedtime.      . Omega-3 Fatty Acids (OMEGA 3 PO) Take 1 capsule by mouth daily.      Marland Kitchen PROAIR HFA 108 (90 BASE) MCG/ACT inhaler Inhale 2 puffs into the lungs every 6 (six) hours as needed for wheezing or shortness of breath.   11  . propranolol (INDERAL) 10 MG tablet Take 1 tablet (10 mg total) by mouth 3 (three) times daily. 60 tablet 6  . rosuvastatin (CRESTOR) 5 MG tablet Take 1 tablet by mouth 3 times a week as tolerated. 15 tablet 11  . azelastine  (ASTELIN) 0.1 % nasal spray USE 1 SPRAY IN EACH NOSTRIL TWICE DAILY FOR 30 DAYS  5  . LYRICA 50 MG capsule Take 100 mg by mouth at bedtime. Reported on 12/30/2015  3  . QNASL 80 MCG/ACT AERS Place 2 sprays into both nostrils daily. Reported on 12/30/2015  5  . tiZANidine (ZANAFLEX) 4 MG tablet Reported on 12/30/2015  2   No current facility-administered medications for this visit.     ALLERGIES: Codeine; Lipitor; and Latex  Family History  Problem Relation Age of Onset  . Coronary artery disease Father   . Hypertension Father   . Heart disease Father   . Asthma Mother   . Hypertension Mother   . Alzheimer's disease Maternal Grandmother   . Lung cancer Maternal Grandfather   . Heart failure Paternal Grandfather   . Colon cancer Neg Hx   . Colon polyps Neg Hx   . Rectal cancer Neg Hx   . Stomach cancer Neg Hx   . Skin cancer Sister   . Hepatitis C Brother     Social History   Social History  . Marital Status: Married    Spouse Name: N/A  . Number of Children: 1  . Years of Education: N/A   Occupational History  . retired    Social History Main Topics  . Smoking status: Never Smoker   . Smokeless tobacco: Never Used  . Alcohol Use: 1.8 - 2.4 oz/week    3-4 Glasses of wine per week  . Drug Use: No  . Sexual Activity:    Partners: Male    Birth Control/ Protection: Surgical     Comment: Hyst   Other Topics Concern  . Not on file   Social History Narrative    ROS:  Pertinent items are noted in HPI.  PHYSICAL EXAMINATION:    BP 120/76 mmHg  Pulse 66  Resp 12  Wt 150 lb (68.04 kg)  LMP 11/07/1980    General appearance: alert, cooperative and appears stated age   Pelvic: External genitalia:  no lesions              Urethra:  normal appearing urethra with no masses, tenderness or lesions              Bartholins and Skenes: normal                 Vagina: normal appearing vagina with normal color and discharge, no lesions  Cervix: absent                 Bimanual Exam:  Uterus:  uterus absent              Adnexa: normal adnexa and no mass, fullness, tenderness               Wet prep:  Negative for yeast, clue cells, and trichomonas. No pH done.   Chaperone was present for exam.  ASSESSMENT  Vaginal burning.  I suspect yeast vaginitis as she has just taken a cephalosporin.   PLAN  Affirm done in follow up to negative wet prep today.  Diflucan 150 mg po.  Instructed in use.  Discussed probiotic use. Keep Estring out until vaginitis resolved. Follow up prn.   An After Visit Summary was printed and given to the patient.  __15____ minutes face to face time of which over 50% was spent in counseling.

## 2015-12-31 LAB — WET PREP BY MOLECULAR PROBE
Candida species: POSITIVE — AB
GARDNERELLA VAGINALIS: NEGATIVE
TRICHOMONAS VAG: NEGATIVE

## 2016-01-04 ENCOUNTER — Encounter: Payer: Self-pay | Admitting: Cardiovascular Disease

## 2016-01-04 ENCOUNTER — Ambulatory Visit (INDEPENDENT_AMBULATORY_CARE_PROVIDER_SITE_OTHER): Payer: BLUE CROSS/BLUE SHIELD | Admitting: Cardiovascular Disease

## 2016-01-04 VITALS — BP 118/74 | HR 64 | Ht 66.0 in | Wt 150.2 lb

## 2016-01-04 DIAGNOSIS — R002 Palpitations: Secondary | ICD-10-CM

## 2016-01-04 DIAGNOSIS — E785 Hyperlipidemia, unspecified: Secondary | ICD-10-CM | POA: Diagnosis not present

## 2016-01-04 DIAGNOSIS — I10 Essential (primary) hypertension: Secondary | ICD-10-CM

## 2016-01-04 MED ORDER — PROPRANOLOL HCL 10 MG PO TABS: 10.0000 mg | ORAL_TABLET | Freq: Three times a day (TID) | ORAL | Status: DC

## 2016-01-04 NOTE — Patient Instructions (Signed)

## 2016-01-04 NOTE — Progress Notes (Signed)
Lauren Montes Date of Birth  27-Sep-1956 [facility] / [facility] [id number], [place]  [address] [phone]  Fax  [phone]  1. Hyperlipidemia 2. Sinus tachycardia 3. Chronic asthma  History of Present Illness:  Bawi is a middle-age female the history of mild diastolic dysfunction as well as leg edema. She also has a history of hypercholesterolemia.   Has a history of hyperlipidemia. We have had her on Crestor the past which she tolerated very well. Her insurance and he refused to pay for to try her on atorvastatin. This caused her to have lots of muscle aches as well as memory loss. We discontinued the atorvastatin at that time.  She has been recording her heart rate and blood pressure readings. Her heart rate is frequently above 100. He also has many readings in the 80-90 range.  Has bruising particularly on her hands and on her fingers. [name] never had any easy bleeding. She did not have any bleeding  complications. during her pregnancies or deliveries.  Feb. 4, 2014: Lauren Montes is doing well.  Her HR and BP have been well controlled.   June 17, 2013:  Lauren Montes is doing well.  She has started waking over the past several weeks.    June 21, 2014:  Lauren Montes is having some issues with DOE.    Perhaps slight chest pressure with exertion.  No pain. .  Sleeps on 2 pillows occasionally at night - not every night.  [name] very careful with her salt intake. She and her husband thinks most of her meals of scratch. She denies any leg swelling   Nov. 17, 2015:  Lauren Montes is doing well.  She is seen for chronic dyspnea. Her echo was normal.  myoview showed no ischemia Occasional heart racing.  Not severe.  Does not take any meds.  Resolves after several minutes. She is walking for exercise - 30-45 minutes 3 times a week.  Also do some strength training  Nov. 17, 2016: Overall doing ok Lots of stress - aging parents  Lots of  muscle cramps in legs and feet.   Feb. 27, 2017:  Lauren Montes is seen back  Cholesterol is back up - she was not able to takes atorvastatin or Pravachol because of severe muscle aches and cramping  And mental foggyness. She has  Tried  Crestor in the past and it caused muscle cramps as well . [name] now on Zetia.    Current Outpatient Prescriptions  Medication Sig Dispense Refill  . ARNUITY ELLIPTA 200 MCG/ACT AEPB Inhale 1 puff into the lungs daily.  5  . azelastine (ASTELIN) 0.1 % nasal spray USE 1 SPRAY IN EACH NOSTRIL TWICE DAILY FOR 30 DAYS  5  . cetirizine (ZYRTEC) 10 MG tablet Take 10 mg by mouth daily.    . cycloSPORINE (RESTASIS) 0.05 % ophthalmic emulsion Place 1 drop into both eyes 2 (two) times daily.    Marland Kitchen diltiazem (CARDIZEM CD) 120 MG 24 hr capsule Take 1 capsule (120 mg total) by mouth daily. 90 capsule 3  . estradiol (CLIMARA - DOSED IN MG/24 HR) 0.0375 mg/24hr patch Place 1 patch (0.0375 mg total) onto the skin once a week. 12 patch 3  . estradiol (ESTRING) 2 MG vaginal ring Place 2 mg vaginally every 3 (three) months. Insert a new ring into vagina every 3 months 1 each 3  . ezetimibe (ZETIA) 10 MG tablet Take 1 tablet (10 mg total) by mouth  daily. 30 tablet 11  . montelukast (SINGULAIR) 10 MG tablet Take 10 mg by mouth at bedtime.      . Omega-3 Fatty Acids (OMEGA 3 PO) Take 1 capsule by mouth daily.      Marland Kitchen PROAIR HFA 108 (90 BASE) MCG/ACT inhaler Inhale 2 puffs into the lungs every 6 (six) hours as needed for wheezing or shortness of breath.   11  . propranolol (INDERAL) 10 MG tablet Take 1 tablet (10 mg total) by mouth 3 (three) times daily. 60 tablet 6  . rosuvastatin (CRESTOR) 5 MG tablet Take 1 tablet by mouth 3 times a week as tolerated. 15 tablet 11  . tiZANidine (ZANAFLEX) 4 MG tablet TAKE 1 TABLET BY MOUTH AS NEEDED FOR BACK PAIN.  2   No current facility-administered medications for this visit.     Allergies  Allergen Reactions  . Codeine Nausea And Vomiting     Migraine headache  . Lipitor [Atorvastatin] Other (See Comments)    Muscle ache, mind foggy,   . Latex Hives and Rash    Past Medical History  Diagnosis Date  . History of diastolic dysfunction   . Asthma   . History of migraine headaches   . Leg edema     left leg  . Tachycardia   . Cystic teratoma   . Elevated cholesterol   . Ocular rosacea   . GERD (gastroesophageal reflux disease)   . Endometriosis   . Blood transfusion without reported diagnosis 1975    with vaginal delivery--very anemic  . Overactive bladder   . Atrophic vaginitis     Past Surgical History  Procedure Laterality Date  . Foot surgery      LEFT FOOT  . Cardiovascular stress test  07/02/2008    EF 55-60%  . Appendectomy    . Bunionectomy      bilateral  . Left breast mass removed    . Rso  2002    partial  . Dental surgery  2007  . Ruptured disc surgery  07,08  . Back surgery    . Oophorectomy  1982    RSO partial and LSO, uterus  . Total abdominal hysterectomy    . Breast surgery  10-09-13    breast reduction--Dr. Holderness    History  Smoking status  . Never Smoker   Smokeless tobacco  . Never Used    History  Alcohol Use  . 1.8 - 2.4 oz/week  . 3-4 Glasses of wine per week    Family History  Problem Relation Age of Onset  . Coronary artery disease Father   . Hypertension Father   . Heart disease Father   . Asthma Mother   . Hypertension Mother   . Alzheimer's disease Maternal Grandmother   . Lung cancer Maternal Grandfather   . Heart failure Paternal Grandfather   . Colon cancer Neg Hx   . Colon polyps Neg Hx   . Rectal cancer Neg Hx   . Stomach cancer Neg Hx   . Skin cancer Sister   . Hepatitis C Brother     Reviw of Systems:  Reviewed in the HPI.  All other systems are negative.  Physical Exam: BP 118/74 mmHg  Pulse 64  Ht 5\' 6"  (1.676 m)  Wt 150 lb 3.2 oz (68.13 kg)  BMI 24.25 kg/m2  LMP 11/07/1980 The patient is alert and oriented x 3.  The mood and  affect are normal.   Skin: warm and dry.  Color  is normal.    HEENT:   the sclera are nonicteric.  The mucous membranes are moist.  The carotids are 2+ without bruits.  There is no thyromegaly.  There is no JVD.    Lungs: clear.  The chest wall is non tender.    Heart: regular rate with a normal S1 and S2.  There are no murmurs, gallops, or rubs. The PMI is not displaced.     Abdomin: good bowel sounds.  There is no guarding or rebound.  There is no hepatosplenomegaly or tenderness.  There are no masses.   Extremities:  Trace edema.  The legs are without rashes.  The distal pulses are intact.   Neuro:  Cranial nerves II - XII are intact.  Motor and sensory functions are intact.    The gait is normal.  ECG:  Assessment / Plan:   1. Essential hypertension: Her blood pressure is fairly elevated today although her readings have been normal this past week. She does not think that she's been eating any extra salt. She'll continue to record her blood pressure on a regular basis and will call us if her readings are elevated.   2. Hyperlipidemia:     Does not tolerate statins.   Is now on zetia.     Discussed injectable meds. Discussed increasing exercise and cutting back  on her dietary fats. We discussed getting a coronary calcium score but we will wait on that for now   3. Supraventricular tachycardia: Stable. Takes propranolol twice a day     Johnni Wunschel, Wonda Cheng, MD  01/04/2016 11:29 AM    [address] [place],  [place], [place]  [address] Pager [phone] Phone: [phone]; Fax: [phone]   [facility]  [address] [place], [place]  [address] [phone]   Fax [phone]

## 2016-01-19 ENCOUNTER — Telehealth: Payer: Self-pay | Admitting: Obstetrics and Gynecology

## 2016-01-19 ENCOUNTER — Telehealth: Payer: Self-pay | Admitting: Cardiovascular Disease

## 2016-01-19 NOTE — Telephone Encounter (Signed)
Patient returned call. She states she felt improvement of symptoms after taking both doses of diflucan as prescribed by Dr. Quincy Simmonds.  She returned to use of Estring on 01/09/16 and felt vaginal burning and itching symptoms started again on 01/16/16. She has removed her estring again and symptoms continue.   Patient is offered office visit today or tomorrow with Dr. Quincy Simmonds and she declines. She states has been a chronic issue for her and would like advice from Dr. Quincy Simmonds at this time. Advised patient will send message to Dr. Quincy Simmonds to review and will return call with response. Patient agreeable.

## 2016-01-19 NOTE — Telephone Encounter (Signed)
Left message to call back  

## 2016-01-19 NOTE — Telephone Encounter (Signed)
Message left to return call to [place] at [phone].   Patient with history of chronic vaginitis.  Was to leave Estring out until vaginitis symptoms resolved. Yeast positive affirm testing with Dr. Quincy Simmonds.

## 2016-01-19 NOTE — Telephone Encounter (Signed)
New message     Pt c/o medication issue:  1. Name of Medication: zetia 2. How are you currently taking this medication (dosage and times per day)? 10mg  daily 3. Are you having a reaction (difficulty breathing--STAT)? no  4. What is your medication issue?  Pt is having restless leg.  She cannot sleep at all.  Please advise

## 2016-01-19 NOTE — Telephone Encounter (Signed)
Patient was treated 12/30/15 for yeast infection by Dr. Quincy Simmonds. Since then she has not been able to use her estring, due to this she has been having itching and burning. Patient wants to know what she should do? Best # to reach: [phone]

## 2016-01-19 NOTE — Telephone Encounter (Signed)
If she can tolerate the Crestor every other day, I would continue with that If the zetia is causing some restless legs, I would have her stop that Check fasting labs in 3 months

## 2016-01-19 NOTE — Telephone Encounter (Signed)
I would recommend repeating a course of the Diflucan 150 mg po x 1.  May repeat in 72 hours if symptoms persist.  Leave Estring out while doing the Diflucan treatment.   Patient has had vaginitis due to different etiologies: atrophy, BV, and yeast.  This recent yeast infection followed antibiotics.   Vaginal estrogen may increase risk of vaginal yeast infections.   If patient continues to have yeast infections, we can consider periodic Diflucan or switching her Estring to a pill called Osphena, which also treats vaginal dryness and does not cause yeast infections.

## 2016-01-19 NOTE — Telephone Encounter (Signed)
Pt called back and said that she has been having "lots of restless leg" since about a week and a half after starting her Zetia. Pt tried to continue taking it hoping it would resolve but it hasn't. Pt states she is still tolerating her Crestor 3x per week well. Pt states she is willing to try Crestor every other day if Dr. Acie Fredrickson feels like it will benefit. Will route to Dr. Acie Fredrickson for review and advisement.

## 2016-01-20 MED ORDER — ROSUVASTATIN CALCIUM 5 MG PO TABS: ORAL_TABLET | ORAL | Status: DC

## 2016-01-20 MED ORDER — FLUCONAZOLE 150 MG PO TABS: ORAL_TABLET | ORAL | Status: DC

## 2016-01-20 NOTE — Telephone Encounter (Signed)
Pt is aware of MD's recommendations. Pt will stop Zetia and take Crestor 5 mg every other day. A prescription for Crestor 5 mg every other day sent to pt's pharmacy; dispense 17 pills and 11 refills. Pt has an appointment for labs (Lipids/LFT ) on May 30 th at 7:30 AM. Pt is aware.

## 2016-01-20 NOTE — Telephone Encounter (Signed)
Spoke with patient and message from Dr. Quincy Simmonds given.   Patient agreeable to instructions from Dr. Quincy Simmonds and will call back for office visit to discuss other options for treatment. Diflucan sent to pharmacy on file and patient verbalized understanding of instructions.  Will call back as needed. Routing to provider for final review. Patient agreeable to disposition. Will close encounter.

## 2016-02-16 DIAGNOSIS — R5381 Other malaise: Secondary | ICD-10-CM | POA: Diagnosis not present

## 2016-02-16 DIAGNOSIS — M797 Fibromyalgia: Secondary | ICD-10-CM | POA: Diagnosis not present

## 2016-02-16 DIAGNOSIS — G4709 Other insomnia: Secondary | ICD-10-CM | POA: Diagnosis not present

## 2016-02-16 DIAGNOSIS — M3509 Sicca syndrome with other organ involvement: Secondary | ICD-10-CM | POA: Diagnosis not present

## 2016-03-09 ENCOUNTER — Ambulatory Visit (INDEPENDENT_AMBULATORY_CARE_PROVIDER_SITE_OTHER): Payer: BLUE CROSS/BLUE SHIELD | Admitting: Obstetrics and Gynecology

## 2016-03-09 ENCOUNTER — Encounter: Payer: Self-pay | Admitting: Obstetrics and Gynecology

## 2016-03-09 DIAGNOSIS — Z01419 Encounter for gynecological examination (general) (routine) without abnormal findings: Secondary | ICD-10-CM

## 2016-03-09 NOTE — Progress Notes (Signed)
Patient ID: Lauren Montes, female   DOB: [birth year], 60 y.o.   MRN: [id number] 60 y.o. [id number] Married Caucasian female here for annual exam.    Reporting abdominal bloating, pain, constipation, diarrhea, and change in stools.  Discussed with PCP.  Now doing dietary changes.  May need follow up with GI.   Notes vulvar/vaginal itching.  Not using vaginal Estring and has less itching now. Sill has external itching.  Uses Monistat almost daily externally, and this helps her symptoms.  Using skin friendly soap.  Uses pads occasionally.  On ERT since her 30s due to early menopause.  Asking if it is time to stop.   Notes some increased heart rate that is being monitored and tx.  Some musculoskeletal swelling.   On Gabapentin once daily. Told she can take three times a day, but she does not.  Just back from [place].   PCP: Domenick Gong, MD  Patient's last menstrual period was 11/07/1980.           Sexually active: Yes.   female The current method of family planning is status post hysterectomy.    Exercising: Yes.    strength training and aerobics. Smoker:  no  Health Maintenance: Pap:  03-06-15 Neg:Neg HR HPV History of abnormal Pap:  no MMG:  08-25-15 3D/Density B/Neg:[facility] Colonoscopy:  10-08-08 polyps Dr. Neoma Laming due 2019 BMD:   2015 Result  Normal TDaP:  PCP Gardasil:   N/A HIV:  Will consider with PCP. Hep C: pt. Thinks she has had drawn in the past Screening Labs:  Hb today: PCP, Urine today: unable   reports that she has never smoked. She has never used smokeless tobacco. She reports that she drinks about 1.8 - 2.4 oz of alcohol per week. She reports that she does not use illicit drugs.  Past Medical History  Diagnosis Date  . History of diastolic dysfunction   . Asthma   . History of migraine headaches   . Leg edema     left leg  . Tachycardia   . Cystic teratoma   . Elevated cholesterol   . Ocular rosacea   . GERD (gastroesophageal  reflux disease)   . Endometriosis   . Blood transfusion without reported diagnosis 1975    with vaginal delivery--very anemic  . Overactive bladder   . Atrophic vaginitis   . Fibromyalgia   . Raynaud's disease   . Osteoarthritis     back,hands, knees    Past Surgical History  Procedure Laterality Date  . Foot surgery      LEFT FOOT  . Cardiovascular stress test  07/02/2008    EF 55-60%  . Appendectomy    . Bunionectomy      bilateral  . Left breast mass removed    . Rso  2002    partial  . Dental surgery  2007  . Ruptured disc surgery  07,08  . Back surgery    . Oophorectomy  1982    RSO partial and LSO, uterus  . Total abdominal hysterectomy    . Breast surgery  10-09-13    breast reduction--Dr. Dessie Coma    Current Outpatient Prescriptions  Medication Sig Dispense Refill  . ARNUITY ELLIPTA 200 MCG/ACT AEPB Inhale 1 puff into the lungs daily.  5  . cetirizine (ZYRTEC) 10 MG tablet Take 10 mg by mouth daily.    . cycloSPORINE (RESTASIS) 0.05 % ophthalmic emulsion Place 1 drop into both eyes 2 (two) times daily.    Marland Kitchen  diltiazem (CARDIZEM CD) 120 MG 24 hr capsule Take 1 capsule (120 mg total) by mouth daily. 90 capsule 3  . estradiol (CLIMARA - DOSED IN MG/24 HR) 0.0375 mg/24hr patch Place 1 patch (0.0375 mg total) onto the skin once a week. 12 patch 3  . gabapentin (NEURONTIN) 300 MG capsule Take 1 capsule by mouth daily.  3  . montelukast (SINGULAIR) 10 MG tablet Take 10 mg by mouth at bedtime.      . Omega-3 Fatty Acids (OMEGA 3 PO) Take 1 capsule by mouth daily.      Marland Kitchen PROAIR HFA 108 (90 BASE) MCG/ACT inhaler Inhale 2 puffs into the lungs every 6 (six) hours as needed for wheezing or shortness of breath.   11  . propranolol (INDERAL) 10 MG tablet Take 1 tablet (10 mg total) by mouth 3 (three) times daily. 60 tablet 6  . rosuvastatin (CRESTOR) 5 MG tablet Take 1 tablet by mouth every other day. 17 tablet 11  . tiZANidine (ZANAFLEX) 4 MG tablet TAKE 1 TABLET BY MOUTH AS  NEEDED FOR BACK PAIN.  2  . estradiol (ESTRING) 2 MG vaginal ring Place 2 mg vaginally every 3 (three) months. Insert a new ring into vagina every 3 months (Patient not taking: Reported on 03/09/2016) 1 each 3   No current facility-administered medications for this visit.    Family History  Problem Relation Age of Onset  . Coronary artery disease Father   . Hypertension Father   . Heart disease Father   . Asthma Mother   . Hypertension Mother   . Alzheimer's disease Maternal Grandmother   . Lung cancer Maternal Grandfather   . Heart failure Paternal Grandfather   . Colon cancer Neg Hx   . Colon polyps Neg Hx   . Rectal cancer Neg Hx   . Stomach cancer Neg Hx   . Skin cancer Sister     melanoma  . Hepatitis C Brother   . Skin cancer Brother     melanoma    ROS:  Pertinent items are noted in HPI.  Otherwise, a comprehensive ROS was negative.  Exam:   LMP 11/07/1980    General appearance: alert, cooperative and appears stated age Head: Normocephalic, without obvious abnormality, atraumatic Neck: no adenopathy, supple, symmetrical, trachea midline and thyroid normal to inspection and palpation, scars consistent with bilateral reduction.  Lungs: clear to auscultation bilaterally Breasts: strength training and aerobics. Heart: regular rate and rhythm Abdomen: incisions:  Yes.     Vertical midline incision, soft, non-tender; no masses, no organomegaly Extremities: extremities normal, atraumatic, no cyanosis or edema Skin: Skin color, texture, turgor normal. No rashes or lesions Lymph nodes: Cervical, supraclavicular, and axillary nodes normal. No abnormal inguinal nodes palpated Neurologic: Grossly normal  Pelvic: External genitalia:  no lesions              Urethra:  normal appearing urethra with no masses, tenderness or lesions              Bartholins and Skenes: normal                 Vagina: normal appearing vagina with normal color and discharge, no lesions               Cervix: absent              Pap taken: No. Bimanual Exam:  Uterus:  uterus absent              Adnexa:  no mass, fullness, tenderness              Rectal exam: Yes.  .  Confirms.              Anus:  normal sphincter tone, no lesions.  Some minor erythema surrounding anus.   Chaperone was present for exam.  Assessment:   Well woman visit with normal exam. Status post TAH/partial RSO/LSO.  Chronic vulvitis and vaginitis.  No sign of vulvar lesions today.  On ERT.  Plan: Yearly mammogram recommended after age 64.  Recommended self breast exam.  Pap and HR HPV as above. Discussed Calcium, Vitamin D, regular exercise program including cardiovascular and weight bearing exercise. Labs performed.  No..   See orders. Prescription medication(s) given.  No..  Discussion of WHI and risks of using ERT - DVT, PE, stroke.  Patient will now stop ERT.  She has already stopped her use of the Estring.  I encouraged used of OTC hydrocortisone cream prn.  No specific area to consider biopsy of the vulva. Patient understands that her Gabapentin treats menopausal hot flashes and night sweats and that she can take 100 mg po tid prn.  Follow up annually and prn.      After visit summary provided.

## 2016-03-09 NOTE — Patient Instructions (Signed)

## 2016-03-17 ENCOUNTER — Telehealth: Payer: Self-pay | Admitting: Cardiovascular Disease

## 2016-03-17 NOTE — Telephone Encounter (Signed)
NEW MESSAGE   Pt is calling for rn    Pt c/o swelling: STAT is pt has developed SOB within 24 hours  1. How long have you been experiencing swelling? 4 TO 6 WEEKS  2. Where is the swelling located? HANDS AND FEET  3.  Are you currently taking a "fluid pill"? NO  4.  Are you currently SOB? NO  5.  Have you traveled recently? NO

## 2016-03-21 MED ORDER — POTASSIUM CHLORIDE ER 10 MEQ PO TBCR: 10.0000 meq | EXTENDED_RELEASE_TABLET | Freq: Every day | ORAL | Status: DC

## 2016-03-21 MED ORDER — FUROSEMIDE 20 MG PO TABS: 20.0000 mg | ORAL_TABLET | Freq: Every day | ORAL | Status: DC

## 2016-03-21 NOTE — Telephone Encounter (Signed)
See if we have a slot that we can work her into

## 2016-03-21 NOTE — Telephone Encounter (Signed)
The Propranolol shouldn't cause much in the way of swelling ( even though its on the package insert)

## 2016-03-21 NOTE — Telephone Encounter (Signed)
I received this message today upon my return to the office.  The message was sent to my during my absence. Patient was very understanding about not getting a call back last week.  She states she would have called back sooner if she had been concerned. She states she is having increased swelling to hands and feet bilaterally.  Denies SOB, chest discomfort.  States she notices more swelling when taking propranolol but she does not like to be off propranolol for too long due to increased heart rate and feeling "heart pounding. I advised that peripheral edema is listed as a common reaction to gabapentin.  She states she tried to decrease the dosage of that medication for a while and noticed slight improvement in swelling.  She states her diet is very healthy and she has not recently increased salt intake.  Her last echo in 06/2014 did not show any evidence of systolic or diastolic dysfunction.  I advised her that I will route to Dr. Acie Fredrickson for review and call her back with his advice.  I offered her appointment today but she is out of town today.  She verbalized understanding and agreement with plan.

## 2016-03-21 NOTE — Telephone Encounter (Signed)
Spoke with patient who states she will be back in town on Thursday.  I spoke with Dr. Acie Fredrickson and he would like patient to try lasix 20 mg and kdur 10 meq once daily starting tomorrow to see if she notes improvement.  She is scheduled to see Dr. Acie Fredrickson on Thursday at 3:00.  I advised her to call back with questions or concerns prior to that appointment.  She verbalized understanding and agreement with plan.

## 2016-03-24 ENCOUNTER — Encounter: Payer: Self-pay | Admitting: Cardiovascular Disease

## 2016-03-24 ENCOUNTER — Ambulatory Visit (INDEPENDENT_AMBULATORY_CARE_PROVIDER_SITE_OTHER): Payer: BLUE CROSS/BLUE SHIELD | Admitting: Cardiovascular Disease

## 2016-03-24 VITALS — BP 120/80 | HR 72 | Ht 66.0 in | Wt 147.4 lb

## 2016-03-24 DIAGNOSIS — E785 Hyperlipidemia, unspecified: Secondary | ICD-10-CM | POA: Diagnosis not present

## 2016-03-24 DIAGNOSIS — R002 Palpitations: Secondary | ICD-10-CM | POA: Diagnosis not present

## 2016-03-24 DIAGNOSIS — I5032 Chronic diastolic (congestive) heart failure: Secondary | ICD-10-CM

## 2016-03-24 DIAGNOSIS — I10 Essential (primary) hypertension: Secondary | ICD-10-CM | POA: Diagnosis not present

## 2016-03-24 NOTE — Patient Instructions (Addendum)
Medication Instructions:  Your physician recommends that you continue on your current medications as directed. Please refer to the Current Medication list given to you today.   Labwork: TODAY:  NONE, 2 WEEKS COME BACK AND GET YOUR LABS THAT HAS ALREADY BEEN ORDERED  Testing/Procedures: None ordered  Follow-Up: Your physician wants you to follow-up in: [place] DR. Acie Fredrickson You will receive a reminder letter in the mail two months in advance. If you don't receive a letter, please call our office to schedule the follow-up appointment.    Any Other Special Instructions Will Be Listed Below (If Applicable).     If you need a refill on your cardiac medications before your next appointment, please call your pharmacy.

## 2016-03-24 NOTE — Progress Notes (Signed)
Lauren Montes Date of Birth  Mar 12, 1956 [facility] / [facility] [id number], [place]  [address] [phone]  Fax  [phone]  1. Hyperlipidemia 2. Sinus tachycardia 3. Chronic asthma  History of Present Illness:  Lauren Montes is a middle-age female the history of mild diastolic dysfunction as well as leg edema. She also has a history of hypercholesterolemia.   Has a history of hyperlipidemia. We have had her on Crestor the past which she tolerated very well. Her insurance and he refused to pay for to try her on atorvastatin. This caused her to have lots of muscle aches as well as memory loss. We discontinued the atorvastatin at that time.  She has been recording her heart rate and blood pressure readings. Her heart rate is frequently above 100. He also has many readings in the 80-90 range.  Has bruising particularly on her hands and on her fingers. [name] never had any easy bleeding. She did not have any bleeding  complications. during her pregnancies or deliveries.  Feb. 4, 2014: Lauren Montes is doing well.  Her HR and BP have been well controlled.   June 17, 2013:  Lauren Montes is doing well.  She has started waking over the past several weeks.    June 21, 2014:  Lauren Montes is having some issues with DOE.    Perhaps slight chest pressure with exertion.  No pain. .  Sleeps on 2 pillows occasionally at night - not every night.  [name] very careful with her salt intake. She and her husband thinks most of her meals of scratch. She denies any leg swelling   Nov. 17, 2015:  Lauren Montes is doing well.  She is seen for chronic dyspnea. Her echo was normal.  myoview showed no ischemia Occasional heart racing.  Not severe.  Does not take any meds.  Resolves after several minutes. She is walking for exercise - 30-45 minutes 3 times a week.  Also do some strength training  Nov. 17, 2016: Overall doing ok Lots of stress - aging parents  Lots of  muscle cramps in legs and feet.   Feb. 27, 2017:  Lauren Montes is seen back  Cholesterol is back up - she was not able to takes atorvastatin or Pravachol because of severe muscle aches and cramping  And mental foggyness. She has  Tried  Crestor in the past and it caused muscle cramps as well . [name] now on Zetia.  May 18 , 2017:  Lauren Montes is seen today for a work in visit.  She has been having lots of swelling in her feet and hands ( feet > hands)  We gave her Lasix and Kdur to take for several days.  She took the Lasix and Kdur for 2 days .   Has not needed it sense  has had some fatigue  Has not really eaten any more salt.  This has been occuring off and of for the past 6 weeks.  Is having palpitations at night  She has had increased anxiety.  Still able to do all of her usual activities without dyspnea.   She recently stopped taking her low dose estrogen ( 2-3 weeks ago )    Current Outpatient Prescriptions  Medication Sig Dispense Refill  . ARNUITY ELLIPTA 200 MCG/ACT AEPB Inhale 1 puff into the lungs daily.  5  . cetirizine (ZYRTEC) 10 MG tablet Take 10 mg by mouth daily.    . cycloSPORINE (RESTASIS) 0.05 %  ophthalmic emulsion Place 1 drop into both eyes 2 (two) times daily.    Marland Kitchen diltiazem (CARDIZEM CD) 120 MG 24 hr capsule Take 1 capsule (120 mg total) by mouth daily. 90 capsule 3  . furosemide (LASIX) 20 MG tablet Take 1 tablet (20 mg total) by mouth daily. 30 tablet 0  . gabapentin (NEURONTIN) 300 MG capsule Take 1 capsule by mouth daily.  3  . montelukast (SINGULAIR) 10 MG tablet Take 10 mg by mouth at bedtime.      . Omega-3 Fatty Acids (OMEGA 3 PO) Take 1 capsule by mouth daily.      . potassium chloride (K-DUR) 10 MEQ tablet Take 1 tablet (10 mEq total) by mouth daily. 30 tablet 0  . PROAIR HFA 108 (90 BASE) MCG/ACT inhaler Inhale 2 puffs into the lungs every 6 (six) hours as needed for wheezing or shortness of breath.   11  . propranolol (INDERAL) 10 MG tablet Take 1 tablet (10  mg total) by mouth 3 (three) times daily. 60 tablet 6  . rosuvastatin (CRESTOR) 5 MG tablet Take 1 tablet by mouth every other day. 17 tablet 11  . tiZANidine (ZANAFLEX) 4 MG tablet Take 4 mg by mouth daily. TAKE 1 TABLET DAILY  BY MOUTH AS NEEDED FOR BACK PAIN.     No current facility-administered medications for this visit.     Allergies  Allergen Reactions  . Codeine Nausea And Vomiting    Migraine headache  . Lipitor [Atorvastatin] Other (See Comments)    Muscle ache, mind foggy,   . Latex Hives and Rash    Past Medical History  Diagnosis Date  . History of diastolic dysfunction   . Asthma   . History of migraine headaches   . Leg edema     left leg  . Tachycardia   . Cystic teratoma   . Elevated cholesterol   . Ocular rosacea   . GERD (gastroesophageal reflux disease)   . Endometriosis   . Blood transfusion without reported diagnosis 1975    with vaginal delivery--very anemic  . Overactive bladder   . Atrophic vaginitis   . Fibromyalgia   . Raynaud's disease   . Osteoarthritis     back,hands, knees    Past Surgical History  Procedure Laterality Date  . Foot surgery      LEFT FOOT  . Cardiovascular stress test  07/02/2008    EF 55-60%  . Appendectomy    . Bunionectomy      bilateral  . Left breast mass removed    . Rso  2002    partial  . Dental surgery  2007  . Ruptured disc surgery  07,08  . Back surgery    . Oophorectomy  1982    RSO partial and LSO, uterus  . Total abdominal hysterectomy    . Breast surgery  10-09-13    breast reduction--Dr. Holderness    History  Smoking status  . Never Smoker   Smokeless tobacco  . Never Used    History  Alcohol Use  . 1.8 - 2.4 oz/week  . 3-4 Glasses of wine per week    Family History  Problem Relation Age of Onset  . Coronary artery disease Father   . Hypertension Father   . Heart disease Father   . Asthma Mother   . Hypertension Mother   . Alzheimer's disease Maternal Grandmother   . Lung  cancer Maternal Grandfather   . Heart failure Paternal Grandfather   .  Colon cancer Neg Hx   . Colon polyps Neg Hx   . Rectal cancer Neg Hx   . Stomach cancer Neg Hx   . Skin cancer Sister     melanoma  . Hepatitis C Brother   . Skin cancer Brother     melanoma  . Heart attack Father   . Heart attack Maternal Grandfather   . Heart attack Paternal Grandfather     Reviw of Systems:  Reviewed in the HPI.  All other systems are negative.  Physical Exam: BP 120/80 mmHg  Pulse 72  Ht 5\' 6"  (1.676 m)  Wt 147 lb 6.4 oz (66.86 kg)  BMI 23.80 kg/m2  LMP 11/07/1980 The patient is alert and oriented x 3.  The mood and affect are normal.   Skin: warm and dry.  Color is normal.    HEENT:   the sclera are nonicteric.  The mucous membranes are moist.  The carotids are 2+ without bruits.  There is no thyromegaly.  There is no JVD.    Lungs: clear.  The chest wall is non tender.    Heart: regular rate with a normal S1 and S2.  There are no murmurs, gallops, or rubs. The PMI is not displaced.     Abdomin: good bowel sounds.  There is no guarding or rebound.  There is no hepatosplenomegaly or tenderness.  There are no masses.   Extremities:  Trace edema.  The legs are without rashes.  The distal pulses are intact.   Neuro:  Cranial nerves II - XII are intact.  Motor and sensory functions are intact.    The gait is normal.  ECG:  Sinus brady at 59.  Otherwise normal    Assessment / Plan:   1. Acute on chronic diastolic CHF; Lauren Montes has had some issues with leg swelling .  Has been relieved with 2 days of lasix and potassium . We noted that she had some diastolic dysfunction on echo from 2009. She she did not have any signs of diastolic dysfunction on her echo from 2015.  At this point I think she is very stable. [name] not having any shortness of breath. [name] able to do all of her normal activities without any significant  problems. We will have her take Lasix and potassium only on an  as-needed basis. If she's requires Lasix and potassium on any sort of regular  basis we'll repeat her echocardiogram. I'll see her in follow-up in 6 months.   2. Essential hypertension: Her blood pressure is fairly elevated today although her readings have been normal this past week. She does not think that she's been eating any extra salt. She'll continue to record her blood pressure on a regular basis and will call us if her readings are elevated.   3. Hyperlipidemia:     She is tolerating Crestor 5 mg every other day .  Had singificant muscle aches with the daily dosing  She wants to try Co-Q 10  Will return in several weeks for fasting labs   4. Supraventricular tachycardia: Stable. Takes propranolol twice a day     Mertie Moores, MD  03/24/2016 3:16 PM    [facility] [address],  [place], [place]  [address] Pager [phone] Phone: [phone]; Fax: [phone]

## 2016-03-30 DIAGNOSIS — J3089 Other allergic rhinitis: Secondary | ICD-10-CM | POA: Diagnosis not present

## 2016-03-30 DIAGNOSIS — J4540 Moderate persistent asthma, uncomplicated: Secondary | ICD-10-CM | POA: Diagnosis not present

## 2016-04-05 ENCOUNTER — Other Ambulatory Visit (INDEPENDENT_AMBULATORY_CARE_PROVIDER_SITE_OTHER): Payer: BLUE CROSS/BLUE SHIELD | Admitting: *Deleted

## 2016-04-05 DIAGNOSIS — E785 Hyperlipidemia, unspecified: Secondary | ICD-10-CM | POA: Diagnosis not present

## 2016-04-05 LAB — LIPID PANEL
Cholesterol: 149 mg/dL (ref 125–200)
HDL: 51 mg/dL (ref >=46)
LDL CALC: 78 mg/dL (ref <130)
TRIGLYCERIDES: 102 mg/dL (ref <150)
Total CHOL/HDL Ratio: 2.9 Ratio (ref <=5.0)
VLDL: 20 mg/dL (ref <30)

## 2016-04-05 LAB — HEPATIC FUNCTION PANEL
ALBUMIN: 4.2 g/dL (ref 3.6–5.1)
ALT: 20 U/L (ref 6–29)
AST: 24 U/L (ref 10–35)
Alkaline Phosphatase: 83 U/L (ref 33–130)
BILIRUBIN INDIRECT: 0.4 mg/dL (ref 0.2–1.2)
Bilirubin, Direct: 0.1 mg/dL (ref <=0.2)
TOTAL PROTEIN: 6.1 g/dL (ref 6.1–8.1)
Total Bilirubin: 0.5 mg/dL (ref 0.2–1.2)

## 2016-04-14 DIAGNOSIS — H16203 Unspecified keratoconjunctivitis, bilateral: Secondary | ICD-10-CM | POA: Diagnosis not present

## 2016-04-14 DIAGNOSIS — H5213 Myopia, bilateral: Secondary | ICD-10-CM | POA: Diagnosis not present

## 2016-04-14 DIAGNOSIS — H25811 Combined forms of age-related cataract, right eye: Secondary | ICD-10-CM | POA: Diagnosis not present

## 2016-04-14 DIAGNOSIS — H04123 Dry eye syndrome of bilateral lacrimal glands: Secondary | ICD-10-CM | POA: Diagnosis not present

## 2016-05-16 DIAGNOSIS — M797 Fibromyalgia: Secondary | ICD-10-CM | POA: Diagnosis not present

## 2016-05-16 DIAGNOSIS — M5137 Other intervertebral disc degeneration, lumbosacral region: Secondary | ICD-10-CM | POA: Diagnosis not present

## 2016-05-16 DIAGNOSIS — G4709 Other insomnia: Secondary | ICD-10-CM | POA: Diagnosis not present

## 2016-05-16 DIAGNOSIS — I7300 Raynaud's syndrome without gangrene: Secondary | ICD-10-CM | POA: Diagnosis not present

## 2016-05-18 ENCOUNTER — Other Ambulatory Visit: Payer: Self-pay | Admitting: Obstetrics and Gynecology

## 2016-05-18 NOTE — Telephone Encounter (Signed)
Spoke with patient, she states that she does wish to restart the Estring.

## 2016-05-18 NOTE — Telephone Encounter (Signed)
Medication refill request: ESTRING 2mg  Last AEX:  03/09/16 Dr. Quincy Simmonds Next AEX: 03/10/17  Last MMG (if hormonal medication request): 08/25/15 [id number] negative Refill authorized: Please advise

## 2016-05-18 NOTE — Telephone Encounter (Signed)
Please confirm with patient that she wishes to restart this.  She had stopped the Estring.

## 2016-05-19 DIAGNOSIS — L814 Other melanin hyperpigmentation: Secondary | ICD-10-CM | POA: Diagnosis not present

## 2016-05-19 DIAGNOSIS — D2261 Melanocytic nevi of right upper limb, including shoulder: Secondary | ICD-10-CM | POA: Diagnosis not present

## 2016-05-19 DIAGNOSIS — L918 Other hypertrophic disorders of the skin: Secondary | ICD-10-CM | POA: Diagnosis not present

## 2016-05-19 DIAGNOSIS — D2271 Melanocytic nevi of right lower limb, including hip: Secondary | ICD-10-CM | POA: Diagnosis not present

## 2016-06-21 ENCOUNTER — Telehealth: Payer: Self-pay | Admitting: Gastroenterology

## 2016-06-21 NOTE — Telephone Encounter (Signed)
Patient reports alternating BM for the last several months. She has been taking probiotics with some benefit.  She asks what she can try until her office visit on 08/15/16.  She is advised to try IBgard until her office visit and I will place her on the cancellation list.

## 2016-06-28 DIAGNOSIS — Z23 Encounter for immunization: Secondary | ICD-10-CM | POA: Diagnosis not present

## 2016-06-28 DIAGNOSIS — E7800 Pure hypercholesterolemia, unspecified: Secondary | ICD-10-CM | POA: Diagnosis not present

## 2016-06-28 DIAGNOSIS — M5416 Radiculopathy, lumbar region: Secondary | ICD-10-CM | POA: Diagnosis not present

## 2016-06-28 DIAGNOSIS — I7300 Raynaud's syndrome without gangrene: Secondary | ICD-10-CM | POA: Insufficient documentation

## 2016-07-27 ENCOUNTER — Other Ambulatory Visit: Payer: Self-pay | Admitting: Obstetrics and Gynecology

## 2016-07-27 DIAGNOSIS — Z1231 Encounter for screening mammogram for malignant neoplasm of breast: Secondary | ICD-10-CM

## 2016-07-27 DIAGNOSIS — Z9889 Other specified postprocedural states: Secondary | ICD-10-CM

## 2016-08-07 DIAGNOSIS — D126 Benign neoplasm of colon, unspecified: Secondary | ICD-10-CM

## 2016-08-07 HISTORY — DX: Benign neoplasm of colon, unspecified: D12.6

## 2016-08-10 ENCOUNTER — Encounter: Payer: Self-pay | Admitting: Obstetrics and Gynecology

## 2016-08-10 ENCOUNTER — Ambulatory Visit (INDEPENDENT_AMBULATORY_CARE_PROVIDER_SITE_OTHER): Payer: BLUE CROSS/BLUE SHIELD | Admitting: Obstetrics and Gynecology

## 2016-08-10 VITALS — BP 110/68 | HR 76 | Ht 66.0 in | Wt 144.8 lb

## 2016-08-10 DIAGNOSIS — N760 Acute vaginitis: Secondary | ICD-10-CM

## 2016-08-10 DIAGNOSIS — R1032 Left lower quadrant pain: Secondary | ICD-10-CM

## 2016-08-10 LAB — POCT URINALYSIS DIPSTICK
Bilirubin, UA: NEGATIVE
Blood, UA: NEGATIVE
Glucose, UA: NEGATIVE
KETONES UA: NEGATIVE
Leukocytes, UA: NEGATIVE
Nitrite, UA: NEGATIVE
PH UA: 5
PROTEIN UA: NEGATIVE
Urobilinogen, UA: NEGATIVE

## 2016-08-10 NOTE — Progress Notes (Signed)
GYNECOLOGY  VISIT   HPI: 60 y.o.   Married  Caucasian  female   G1P1001 with Patient's last menstrual period was 11/07/1980.   here for vaginal itching and burning. Patient also complains of LLQ discomfort and unsure if getting an UTI.  Monistat was helpful for vaginal itching and burning while she was on this.   Using Estring.  Last vaginitis was Candida in February 2017.  Tx with Diflucan.   Using probiotics.  No recent abx use.  Glucose 91 on 12/22/15 with PCP.  Will see Dr. Fuller Plan next week for bowel issues.   Having LLQ pain, constipation, diarrhea, and change in the quality/character of her stools - all occurring since the spring.   Urine Dip: Neg  GYNECOLOGIC HISTORY: Patient's last menstrual period was 11/07/1980. Contraception:  Hysterectomy Menopausal hormone therapy:  none Last mammogram: 08-25-15 3D/Density B/Neg:[facility]  Last pap smear:  03-06-15 Neg:Neg HR HPV         OB History    Gravida Para Term Preterm AB Living   1 1 1     1    SAB TAB Ectopic Multiple Live Births                     Patient Active Problem List   Diagnosis Date Noted  . HTN (hypertension) 09/24/2015  . Palpitations 03/05/2015  . Fibromyalgia 03/05/2015  . Chronic diastolic CHF (congestive heart failure), NYHA class 2 ([place]) 06/20/2014  . Sinus tachycardia 09/14/2012  . Dysphagia 06/29/2012  . Ocular rosacea   . GERD (gastroesophageal reflux disease)   . Cough 09/09/2011  . Chest tightness, discomfort, or pressure 09/09/2011  . Hyperlipidemia 05/10/2011  . SWELLING OF LIMB 01/20/2011  . GERD 02/03/2010  . ABDOMINAL PAIN-EPIGASTRIC 02/03/2010  . ABDOMINAL PAIN-MULTIPLE SITES 02/03/2010  . CONSTIPATION 09/19/2008  . ABDOMINAL PAIN, LEFT LOWER QUADRANT 09/19/2008  . COLONIC POLYPS, HYPERPLASTIC, HX OF 09/19/2008    Past Medical History:  Diagnosis Date  . Asthma   . Atrophic vaginitis   . Blood transfusion without reported diagnosis 1975   with vaginal  delivery--very anemic  . Cystic teratoma   . Elevated cholesterol   . Endometriosis   . Esophageal ulcer   . Fibromyalgia   . Fundic gland polyps of stomach, benign   . GERD (gastroesophageal reflux disease)   . History of diastolic dysfunction   . History of migraine headaches   . Leg edema    left leg  . Ocular rosacea   . Osteoarthritis    back,hands, knees  . Overactive bladder   . Raynaud's disease   . Tachycardia     Past Surgical History:  Procedure Laterality Date  . APPENDECTOMY    . BACK SURGERY    . BREAST SURGERY  10-09-13   breast reduction--Dr. Dessie Coma  . BUNIONECTOMY     bilateral  . CARDIOVASCULAR STRESS TEST  07/02/2008   EF 55-60%  . DENTAL SURGERY  2007  . FOOT SURGERY     LEFT FOOT  . left breast mass removed    . OOPHORECTOMY  1982   RSO partial and LSO, uterus  . RSO  2002   partial  . ruptured disc surgery  07,08  . TOTAL ABDOMINAL HYSTERECTOMY      Current Outpatient Prescriptions  Medication Sig Dispense Refill  . ARNUITY ELLIPTA 200 MCG/ACT AEPB Inhale 1 puff into the lungs daily.  5  . cetirizine (ZYRTEC) 10 MG tablet Take 10 mg by mouth  daily.    . cycloSPORINE (RESTASIS) 0.05 % ophthalmic emulsion Place 1 drop into both eyes 2 (two) times daily.    Marland Kitchen diltiazem (CARDIZEM CD) 120 MG 24 hr capsule Take 1 capsule (120 mg total) by mouth daily. 90 capsule 3  . gabapentin (NEURONTIN) 300 MG capsule Take 1 capsule by mouth daily.  3  . montelukast (SINGULAIR) 10 MG tablet Take 10 mg by mouth at bedtime.      . Omega-3 Fatty Acids (OMEGA 3 PO) Take 1 capsule by mouth daily.      Marland Kitchen PROAIR HFA 108 (90 BASE) MCG/ACT inhaler Inhale 2 puffs into the lungs every 6 (six) hours as needed for wheezing or shortness of breath.   11  . rosuvastatin (CRESTOR) 5 MG tablet Take 1 tablet by mouth every other day. 17 tablet 11  . sertraline (ZOLOFT) 50 MG tablet 1/2 TABLET ONCE DAILY  2  . tiZANidine (ZANAFLEX) 4 MG tablet Take 4 mg by mouth daily. TAKE 1  TABLET DAILY  BY MOUTH AS NEEDED FOR BACK PAIN.    Marland Kitchen ESTRING 2 MG vaginal ring PLACE 2 MG VAGINALLY EVERY 3 (THREE) MONTHS. INSERT A NEW RING INTO VAGINA EVERY 3 MONTHS (Patient not taking: Reported on 08/10/2016) 1 each 3   No current facility-administered medications for this visit.      ALLERGIES: Codeine; Lipitor [atorvastatin]; and Latex  Family History  Problem Relation Age of Onset  . Coronary artery disease Father   . Hypertension Father   . Heart disease Father   . Heart attack Father   . Asthma Mother   . Hypertension Mother   . Alzheimer's disease Maternal Grandmother   . Lung cancer Maternal Grandfather   . Heart attack Maternal Grandfather   . Heart failure Paternal Grandfather   . Heart attack Paternal Grandfather   . Skin cancer Sister     melanoma  . Hepatitis C Brother   . Skin cancer Brother     melanoma  . Colon cancer Neg Hx   . Colon polyps Neg Hx   . Rectal cancer Neg Hx   . Stomach cancer Neg Hx     Social History   Social History  . Marital status: Married    Spouse name: N/A  . Number of children: 1  . Years of education: N/A   Occupational History  . retired Unemployed   Social History Main Topics  . Smoking status: Never Smoker  . Smokeless tobacco: Never Used  . Alcohol use 1.8 - 2.4 oz/week    3 - 4 Glasses of wine per week  . Drug use: No  . Sexual activity: Yes    Partners: Male    Birth control/ protection: Surgical     Comment: Hyst   Other Topics Concern  . Not on file   Social History Narrative  . No narrative on file    ROS:  Pertinent items are noted in HPI.  PHYSICAL EXAMINATION:    BP 110/68 (BP Location: Right Arm, Patient Position: Sitting, Cuff Size: Normal)   Pulse 76   Ht 5\' 6"  (1.676 m)   Wt 144 lb 12.8 oz (65.7 kg)   LMP 11/07/1980   BMI 23.37 kg/m     General appearance: alert, cooperative and appears stated age   Pelvic: External genitalia:  no lesions              Urethra:  normal appearing  urethra with no masses, tenderness or lesions  Bartholins and Skenes: normal                 Vagina: normal appearing vagina with normal color and discharge, no lesions              Cervix:  absent                Bimanual Exam:  Uterus:   absent              Adnexa: no mass, fullness, tenderness             Chaperone was present for exam.  ASSESSMENT  Status post TAH.  Status post partial RSO and status post LSO. Vaginitis.  Uses Estring for atrophy.  No UTI.  LLQ pain with constipation and diarrhea.   PLAN  Affirm.  Tx to follow. Continue Estring.  GI consult.     An After Visit Summary was printed and given to the patient.  __15____ minutes face to face time of which over 50% was spent in counseling.

## 2016-08-11 ENCOUNTER — Encounter: Payer: Self-pay | Admitting: Obstetrics and Gynecology

## 2016-08-11 LAB — WET PREP BY MOLECULAR PROBE
Candida species: NEGATIVE
GARDNERELLA VAGINALIS: NEGATIVE
TRICHOMONAS VAG: NEGATIVE

## 2016-08-15 ENCOUNTER — Encounter: Payer: Self-pay | Admitting: Gastroenterology

## 2016-08-15 ENCOUNTER — Ambulatory Visit (INDEPENDENT_AMBULATORY_CARE_PROVIDER_SITE_OTHER): Payer: BLUE CROSS/BLUE SHIELD | Admitting: Gastroenterology

## 2016-08-15 ENCOUNTER — Other Ambulatory Visit (INDEPENDENT_AMBULATORY_CARE_PROVIDER_SITE_OTHER): Payer: BLUE CROSS/BLUE SHIELD

## 2016-08-15 VITALS — BP 120/72 | HR 80 | Ht 66.0 in | Wt 145.4 lb

## 2016-08-15 DIAGNOSIS — R197 Diarrhea, unspecified: Secondary | ICD-10-CM

## 2016-08-15 DIAGNOSIS — R634 Abnormal weight loss: Secondary | ICD-10-CM

## 2016-08-15 DIAGNOSIS — R194 Change in bowel habit: Secondary | ICD-10-CM

## 2016-08-15 DIAGNOSIS — R195 Other fecal abnormalities: Secondary | ICD-10-CM

## 2016-08-15 DIAGNOSIS — R1032 Left lower quadrant pain: Secondary | ICD-10-CM

## 2016-08-15 DIAGNOSIS — R1314 Dysphagia, pharyngoesophageal phase: Secondary | ICD-10-CM

## 2016-08-15 LAB — COMPREHENSIVE METABOLIC PANEL
ALT: 23 U/L (ref 0–35)
AST: 28 U/L (ref 0–37)
Albumin: 4.5 g/dL (ref 3.5–5.2)
Alkaline Phosphatase: 101 U/L (ref 39–117)
BUN: 9 mg/dL (ref 6–23)
CALCIUM: 9.8 mg/dL (ref 8.4–10.5)
CHLORIDE: 107 meq/L (ref 96–112)
CO2: 26 meq/L (ref 19–32)
Creatinine, Ser: 0.73 mg/dL (ref 0.40–1.20)
GFR: 86.32 mL/min (ref >60.00)
GLUCOSE: 98 mg/dL (ref 70–99)
Potassium: 4.2 mEq/L (ref 3.5–5.1)
Sodium: 142 mEq/L (ref 135–145)
Total Bilirubin: 0.3 mg/dL (ref 0.2–1.2)
Total Protein: 7.3 g/dL (ref 6.0–8.3)

## 2016-08-15 LAB — C-REACTIVE PROTEIN: CRP: 0.4 mg/dL — ABNORMAL LOW (ref 0.5–20.0)

## 2016-08-15 LAB — TSH: TSH: 2.26 u[IU]/mL (ref 0.35–4.50)

## 2016-08-15 LAB — CBC WITH DIFFERENTIAL/PLATELET
BASOS PCT: 0.5 % (ref 0.0–3.0)
Basophils Absolute: 0 10*3/uL (ref 0.0–0.1)
Eosinophils Absolute: 0.1 10*3/uL (ref 0.0–0.7)
Eosinophils Relative: 1.1 % (ref 0.0–5.0)
HEMATOCRIT: 41.4 % (ref 36.0–46.0)
Hemoglobin: 13.9 g/dL (ref 12.0–15.0)
LYMPHS ABS: 2.6 10*3/uL (ref 0.7–4.0)
LYMPHS PCT: 36.4 % (ref 12.0–46.0)
MCHC: 33.6 g/dL (ref 30.0–36.0)
MCV: 91.1 fl (ref 78.0–100.0)
MONOS PCT: 7.1 % (ref 3.0–12.0)
Monocytes Absolute: 0.5 10*3/uL (ref 0.1–1.0)
NEUTROS ABS: 3.9 10*3/uL (ref 1.4–7.7)
NEUTROS PCT: 54.9 % (ref 43.0–77.0)
PLATELETS: 248 10*3/uL (ref 150.0–400.0)
RBC: 4.55 Mil/uL (ref 3.87–5.11)
RDW: 12.6 % (ref 11.5–15.5)
WBC: 7.1 10*3/uL (ref 4.0–10.5)

## 2016-08-15 LAB — LIPASE: Lipase: 17 U/L (ref 11.0–59.0)

## 2016-08-15 LAB — SEDIMENTATION RATE: Sed Rate: 19 mm/hr (ref 0–30)

## 2016-08-15 LAB — IGA: IgA: 116 mg/dL (ref 68–378)

## 2016-08-15 MED ORDER — NA SULFATE-K SULFATE-MG SULF 17.5-3.13-1.6 GM/177ML PO SOLN: 1.0000 | Freq: Once | ORAL | 0 refills | Status: DC

## 2016-08-15 MED ORDER — DICYCLOMINE HCL 10 MG PO CAPS: 10.0000 mg | ORAL_CAPSULE | Freq: Three times a day (TID) | ORAL | 5 refills | Status: DC

## 2016-08-15 MED ORDER — NA SULFATE-K SULFATE-MG SULF 17.5-3.13-1.6 GM/177ML PO SOLN: 1.0000 | Freq: Once | ORAL | 0 refills | Status: AC

## 2016-08-15 MED ORDER — DICYCLOMINE HCL 10 MG PO CAPS
10.0000 mg | ORAL_CAPSULE | Freq: Three times a day (TID) | ORAL | 5 refills | Status: DC
Start: 2016-08-15 — End: 2017-03-17

## 2016-08-15 NOTE — Patient Instructions (Signed)
Your physician has requested that you go to the basement for lab work before leaving today.  We have sent the following medications to your pharmacy for you to pick up at your convenience:Bentyl.  You have been scheduled for a CT scan of the abdomen and pelvis at [facility] ([address]---this is in the same building as [facility]).   You are scheduled on 08/24/16 at 1:00pm. You should arrive 15 minutes prior to your appointment time for registration. Please follow the written instructions below on the day of your exam:  WARNING: IF YOU ARE ALLERGIC TO IODINE/X-RAY DYE, PLEASE NOTIFY RADIOLOGY IMMEDIATELY AT [phone]! YOU WILL BE GIVEN A 13 HOUR PREMEDICATION PREP.  1) Do not eat or drink anything after 9:00am (4 hours prior to your test) 2) You have been given 2 bottles of oral contrast to drink. The solution may taste               better if refrigerated, but do NOT add ice or any other liquid to this solution. Shake             well before drinking.    Drink 1 bottle of contrast @ 11:00am (2 hours prior to your exam)  Drink 1 bottle of contrast @ 12:00pm (1 hour prior to your exam)  You may take any medications as prescribed with a small amount of water except for the following: Metformin, Glucophage, Glucovance, Avandamet, Riomet, Fortamet, Actoplus Met, Janumet, Glumetza or Metaglip. The above medications must be held the day of the exam AND 48 hours after the exam.  The purpose of you drinking the oral contrast is to aid in the visualization of your intestinal tract. The contrast solution may cause some diarrhea. Before your exam is started, you will be given a small amount of fluid to drink. Depending on your individual set of symptoms, you may also receive an intravenous injection of x-ray contrast/dye. Plan on being at [facility] for 30 minutes or longer, depending on the type of exam you are having performed.  This test typically takes 30-45 minutes  to complete.  If you have any questions regarding your exam or if you need to reschedule, you may call the CT department at [phone] between the hours of 8:00 am and 5:00 pm, Monday-Friday.  ________________________________________________________________________   Lauren Montes have been scheduled for an endoscopy and colonoscopy. Please follow the written instructions given to you at your visit today. Please pick up your prep supplies at the pharmacy within the next 1-3 days. If you use inhalers (even only as needed), please bring them with you on the day of your procedure. Your physician has requested that you go to www.startemmi.com and enter the access code given to you at your visit today. This web site gives a general overview about your procedure. However, you should still follow specific instructions given to you by our office regarding your preparation for the procedure.  Thank you for choosing me and [facility].  Pricilla Riffle. Dagoberto Ligas., MD., [facility] Regal

## 2016-08-15 NOTE — Progress Notes (Signed)
    History of Present Illness: This is a 59 year old female self referred for the evaluation of change in stool caliber, left lower quadrant pain, urgent diarrhea, infrequent fecal incontinence, abdominal bloating, weight loss, nausea, choking sensation and dysphagia. She is accompanied by her husband. She notes about a 15 pound weight loss since the spring. She was previously evaluated with EGDs in 2011 and 2013 for choking sensation and dysphagia. EGD in 2011 showed mild Candida esophagitis. 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 Colonoscopy in 2009 was performed for constipation, hematochezia left lower quadrant pain. Small hyperplastic colon polyps were noted the colonoscopy was otherwise normal. She was treated with Levsin for IBS. Denies constipation,  melena, hematochezia, vomiting, chest pain.  Review of Systems: Pertinent positive and negative review of systems were noted in the above HPI section. All other review of systems were otherwise negative.  Current Medications, Allergies, Past Medical History, Past Surgical History, Family History and Social History were reviewed in [employer] record.  Physical Exam: General: Well developed, well nourished, no acute distress Head: Normocephalic and atraumatic Eyes:  sclerae anicteric, EOMI Ears: Normal auditory acuity Mouth: No deformity or lesions Neck: Supple, no masses or thyromegaly Lungs: Clear throughout to auscultation Heart: Regular rate and rhythm; no murmurs, rubs or bruits Abdomen: Soft, LLQ tenderness and non distended. No masses, hepatosplenomegaly or hernias noted. Normal Bowel sounds Rectal: deferred to colonoscopy Musculoskeletal: Symmetrical with no gross deformities  Skin: No lesions on visible extremities Pulses:  Normal pulses noted Extremities: No clubbing, cyanosis, edema or deformities noted Neurological: Alert oriented x 4, grossly  nonfocal Cervical Nodes:  No significant cervical adenopathy Inguinal Nodes: No significant inguinal adenopathy Psychological:  Alert and cooperative. Normal mood and affect  Assessment and Recommendations:  1.  Diarrhea, occasional incontinence, change in stool caliber, LLQ pain, weight loss, nausea, choking sensation, dysphagia. R/O GERD, globus sensation, esophagitis, esophageal stricture, IBS, IBD, occult abdominal/pelvic neoplasm, colorectal neoplasms, CBC, CMP, TSH, ESR, CRP, tTG, IgA, lipase. Bentyl 10 mg ac and hs. Imodium 3 times a day when necessary. Schedule abd/pelvic CT, colonoscopy, EGD. The risks (including bleeding, perforation, infection, missed lesions, medication reactions and possible hospitalization or surgery if complications occur), benefits, and alternatives to colonoscopy with possible biopsy and possible polypectomy were discussed with the patient and they consent to proceed. The risks (including bleeding, perforation, infection, missed lesions, medication reactions and possible hospitalization or surgery if complications occur), benefits, and alternatives to endoscopy with possible biopsy and possible dilation were discussed with the patient and they consent to proceed.

## 2016-08-16 LAB — TISSUE TRANSGLUTAMINASE, IGA: TISSUE TRANSGLUTAMINASE AB, IGA: 1 U/mL (ref <4)

## 2016-08-17 ENCOUNTER — Ambulatory Visit (AMBULATORY_SURGERY_CENTER): Payer: BLUE CROSS/BLUE SHIELD | Admitting: Gastroenterology

## 2016-08-17 ENCOUNTER — Encounter: Payer: Self-pay | Admitting: Gastroenterology

## 2016-08-17 ENCOUNTER — Other Ambulatory Visit: Payer: BLUE CROSS/BLUE SHIELD

## 2016-08-17 VITALS — BP 118/66 | HR 84 | Temp 98.6°F | Resp 13 | Ht 66.0 in | Wt 145.0 lb

## 2016-08-17 DIAGNOSIS — R1310 Dysphagia, unspecified: Secondary | ICD-10-CM | POA: Diagnosis not present

## 2016-08-17 DIAGNOSIS — R1032 Left lower quadrant pain: Secondary | ICD-10-CM | POA: Diagnosis not present

## 2016-08-17 DIAGNOSIS — K317 Polyp of stomach and duodenum: Secondary | ICD-10-CM | POA: Diagnosis not present

## 2016-08-17 DIAGNOSIS — R634 Abnormal weight loss: Secondary | ICD-10-CM

## 2016-08-17 DIAGNOSIS — D120 Benign neoplasm of cecum: Secondary | ICD-10-CM | POA: Diagnosis not present

## 2016-08-17 DIAGNOSIS — R197 Diarrhea, unspecified: Secondary | ICD-10-CM

## 2016-08-17 DIAGNOSIS — D123 Benign neoplasm of transverse colon: Secondary | ICD-10-CM | POA: Diagnosis not present

## 2016-08-17 DIAGNOSIS — D124 Benign neoplasm of descending colon: Secondary | ICD-10-CM | POA: Diagnosis not present

## 2016-08-17 MED ORDER — SODIUM CHLORIDE 0.9 % IV SOLN: 500.0000 mL | INTRAVENOUS | Status: DC

## 2016-08-17 NOTE — Op Note (Signed)
[facility] Patient Name: Lauren Montes Procedure Date: 08/17/2016 2:36 PM MRN: [id number] Endoscopist: Ladene Artist , MD Age: 60 Referring MD:  Date of Birth: [birth year] Gender: Female Account #: [id number] Procedure:                Upper GI endoscopy Indications:              Dysphagia, Weight loss, LLQ pain Medicines:                Monitored Anesthesia Care Procedure:                Pre-Anesthesia Assessment:                           - Prior to the procedure, a History and Physical                            was performed, and patient medications and                            allergies were reviewed. The patient's tolerance of                            previous anesthesia was also reviewed. The risks                            and benefits of the procedure and the sedation                            options and risks were discussed with the patient.                            All questions were answered, and informed consent                            was obtained. Prior Anticoagulants: The patient has                            taken no previous anticoagulant or antiplatelet                            agents. ASA Grade Assessment: II - A patient with                            mild systemic disease. After reviewing the risks                            and benefits, the patient was deemed in                            satisfactory condition to undergo the procedure.                           After obtaining informed consent, the endoscope was  passed under direct vision. Throughout the                            procedure, the patient's blood pressure, pulse, and                            oxygen saturations were monitored continuously. The                            Model [id number]-[id number] scope was introduced                            through the mouth, and advanced to the second part                            of duodenum. The  upper GI endoscopy was                            accomplished without difficulty. The patient                            tolerated the procedure well. Scope In: Scope Out: Findings:                 The examined esophagus was normal. A guidewire was                            placed and the scope was withdrawn. Dilation was                            performed for dysphagia without a stricture noted                            with a Savary dilator with no resistance at 16 mm.                           Multiple 3 to 6 mm sessile polyps with no bleeding                            and no stigmata of recent bleeding were found in                            the gastric fundus and in the gastric body.                            Biopsies were taken with a cold forceps for                            histology.                           The exam of the stomach was otherwise normal.  The duodenal bulb and second portion of the                            duodenum were normal. Complications:            No immediate complications. Estimated Blood Loss:     Estimated blood loss was minimal. Impression:               - Normal esophagus. Dilated.                           - Multiple gastric polyps. Biopsied.                           - Normal duodenal bulb and second portion of the                            duodenum. Recommendation:           - Patient has a contact number available for                            emergencies. The signs and symptoms of potential                            delayed complications were discussed with the                            patient. Return to normal activities tomorrow.                            Written discharge instructions were provided to the                            patient.                           - Clear liquid diet for 2 hours then advance as                            tolerated to soft diet today. Resume prior diet                             tomorrow.                           - Continue present medications.                           - Await pathology results.                           - Return to GI office in 6 weeks. Ladene Artist, MD 08/17/2016 3:28:00 PM This report has been signed electronically.

## 2016-08-17 NOTE — Progress Notes (Signed)
  [facility] Post-op Note  Patient: Lauren Montes  Procedure(s) Performed: colonoscopy and endoscopy with dilatation  Patient Location: [facility] - Recovery Area  Anesthesia Type: Deep Sedation/Propofol  Level of Consciousness: awake, oriented and patient cooperative  Airway and Oxygen Therapy: Patient Spontanous Breathing  Post-op Pain: none  Post-op Assessment:  Post-op Vital signs reviewed, Patient's Cardiovascular Status Stable, Respiratory Function Stable, Patent Airway, No signs of Nausea or vomiting and Pain level controlled  Post-op Vital Signs: Reviewed and stable  Complications: No apparent anesthesia complications  Drue Camera E 3:29 PM

## 2016-08-17 NOTE — Progress Notes (Signed)
Called to room to assist during endoscopic procedure.  Patient ID and intended procedure confirmed with present staff. Received instructions for my participation in the procedure from the performing physician.  

## 2016-08-17 NOTE — Op Note (Signed)
[facility] Patient Name: Lauren Montes Procedure Date: 08/17/2016 2:37 PM MRN: [id number] Endoscopist: Ladene Artist , MD Age: 60 Referring MD:  Date of Birth: 11-03-1956 Gender: Female Account #: [id number] Procedure:                Colonoscopy Indications:              Abdominal pain in the left lower quadrant,                            Clinically significant diarrhea of unexplained                            origin, Weight loss Medicines:                Monitored Anesthesia Care Procedure:                Pre-Anesthesia Assessment:                           - Prior to the procedure, a History and Physical                            was performed, and patient medications and                            allergies were reviewed. The patient's tolerance of                            previous anesthesia was also reviewed. The risks                            and benefits of the procedure and the sedation                            options and risks were discussed with the patient.                            All questions were answered, and informed consent                            was obtained. Prior Anticoagulants: The patient has                            taken no previous anticoagulant or antiplatelet                            agents. ASA Grade Assessment: II - A patient with                            mild systemic disease. After reviewing the risks                            and benefits, the patient was deemed in  satisfactory condition to undergo the procedure.                           After obtaining informed consent, the colonoscope                            was passed under direct vision. Throughout the                            procedure, the patient's blood pressure, pulse, and                            oxygen saturations were monitored continuously. The                            Model [id number]-[id number]) scope was  introduced                            through the anus and advanced to the the terminal                            ileum. The terminal ileum, ileocecal valve,                            appendiceal orifice, and rectum were photographed.                            The quality of the bowel preparation was excellent.                            The colonoscopy was performed without difficulty.                            The patient tolerated the procedure well. Scope In: 2:56:53 PM Scope Out: 3:11:50 PM Scope Withdrawal Time: 0 hours 11 minutes 52 seconds  Total Procedure Duration: 0 hours 14 minutes 57 seconds  Findings:                 The perianal and digital rectal examinations were                            normal.                           A 3 mm polyp was found in the cecum. The polyp was                            sessile. The polyp was removed with a cold biopsy                            forceps. Resection and retrieval were complete.                           Two sessile polyps were found in the descending  colon and transverse colon. The polyps were 6 to 7                            mm in size. These polyps were removed with a cold                            snare. Resection and retrieval were complete.                           The terminal ileum appeared normal.                           The exam was otherwise without abnormality on                            direct and retroflexion views. Random biopsies                            taken throughout the colon. Complications:            No immediate complications. Estimated blood loss:                            None. Estimated Blood Loss:     Estimated blood loss: none. Impression:               - One 3 mm polyp in the cecum, removed with a cold                            biopsy forceps. Resected and retrieved.                           - Two 6 to 7 mm polyps in the descending colon and                             in the transverse colon, removed with a cold snare.                            Resected and retrieved.                           - The examined portion of the ileum was normal.                           - The examination was otherwise normal on direct                            and retroflexion views. Recommendation:           - Repeat colonoscopy in 5 years for surveillance if                            polyp(s) are precancerous, otherwise 10 years.                           -  Patient has a contact number available for                            emergencies. The signs and symptoms of potential                            delayed complications were discussed with the                            patient. Return to normal activities tomorrow.                            Written discharge instructions were provided to the                            patient.                           - Resume previous diet.                           - Continue present medications.                           - Await pathology results. Ladene Artist, MD 08/17/2016 3:23:33 PM This report has been signed electronically.

## 2016-08-17 NOTE — Patient Instructions (Addendum)
Biopsies taken today. Colon polyps removed. Result letter in your mail in 2-3 weeks. Resume current medications. Office follow up in 6 weeks. Patient will call us back to schedule this appointment. CT scan appointment is on Friday.  Handouts given on Dilation diet, colon polyps. Call us with any questions or concerns. Thank you!   YOU HAD AN ENDOSCOPIC PROCEDURE TODAY AT [facility]:   Refer to the procedure report that was given to you for any specific questions about what was found during the examination.  If the procedure report does not answer your questions, please call your gastroenterologist to clarify.  If you requested that your care partner not be given the details of your procedure findings, then the procedure report has been included in a sealed envelope for you to review at your convenience later.  YOU SHOULD EXPECT: Some feelings of bloating in the abdomen. Passage of more gas than usual.  Walking can help get rid of the air that was put into your GI tract during the procedure and reduce the bloating. If you had a lower endoscopy (such as a colonoscopy or flexible sigmoidoscopy) you may notice spotting of blood in your stool or on the toilet paper. If you underwent a bowel prep for your procedure, you may not have a normal bowel movement for a few days.  Please Note:  You might notice some irritation and congestion in your nose or some drainage.  This is from the oxygen used during your procedure.  There is no need for concern and it should clear up in a day or so.  SYMPTOMS TO REPORT IMMEDIATELY:   Following lower endoscopy (colonoscopy or flexible sigmoidoscopy):  Excessive amounts of blood in the stool  Significant tenderness or worsening of abdominal pains  Swelling of the abdomen that is new, acute  Fever of 100F or higher   Following upper endoscopy (EGD)  Vomiting of blood or coffee ground material  New chest pain or pain under the shoulder  blades  Painful or persistently difficult swallowing  New shortness of breath  Fever of 100F or higher  Black, tarry-looking stools  For urgent or emergent issues, a gastroenterologist can be reached at any hour by calling [phone].   DIET:  Clear liquid diet for 2 hours today then advance as tolerated to soft diet today. Resume prior diet tomorrow. Drink plenty of fluids but you should avoid alcoholic beverages for 24 hours.  ACTIVITY:  You should plan to take it easy for the rest of today and you should NOT DRIVE or use heavy machinery until tomorrow (because of the sedation medicines used during the test).    FOLLOW UP: Our staff will call the number listed on your records the next business day following your procedure to check on you and address any questions or concerns that you may have regarding the information given to you following your procedure. If we do not reach you, we will leave a message.  However, if you are feeling well and you are not experiencing any problems, there is no need to return our call.  We will assume that you have returned to your regular daily activities without incident.  If any biopsies were taken you will be contacted by phone or by letter within the next 1-3 weeks.  Please call us at [phone] if you have not heard about the biopsies in 3 weeks.    SIGNATURES/CONFIDENTIALITY: You and/or your care partner have signed paperwork which will  be entered into your electronic medical record.  These signatures attest to the fact that that the information above on your After Visit Summary has been reviewed and is understood.  Full responsibility of the confidentiality of this discharge information lies with you and/or your care-partner. 

## 2016-08-18 ENCOUNTER — Telehealth: Payer: Self-pay | Admitting: *Deleted

## 2016-08-18 NOTE — Telephone Encounter (Signed)
  Follow up Call-  Call back number 08/17/2016  Post procedure Call Back phone  # [phone]  Permission to leave phone message Yes  Some recent data might be hidden     Patient questions:  Do you have a fever, pain , or abdominal swelling? No. Pain Score  0 *  Have you tolerated food without any problems? Yes.    Have you been able to return to your normal activities? Yes.    Do you have any questions about your discharge instructions: Diet   No. Medications  No. Follow up visit  No.  Do you have questions or concerns about your Care? No.  Actions: * If pain score is 4 or above: No action needed, pain <4.

## 2016-08-19 ENCOUNTER — Ambulatory Visit (INDEPENDENT_AMBULATORY_CARE_PROVIDER_SITE_OTHER)
Admission: RE | Admit: 2016-08-19 | Discharge: 2016-08-19 | Disposition: A | Discharge status: Home / Self Care | Payer: BLUE CROSS/BLUE SHIELD | Source: Ambulatory Visit | Attending: Gastroenterology | Admitting: Gastroenterology

## 2016-08-19 ENCOUNTER — Telehealth: Payer: Self-pay | Admitting: Gastroenterology

## 2016-08-19 DIAGNOSIS — R195 Other fecal abnormalities: Secondary | ICD-10-CM

## 2016-08-19 DIAGNOSIS — R197 Diarrhea, unspecified: Secondary | ICD-10-CM

## 2016-08-19 DIAGNOSIS — R634 Abnormal weight loss: Secondary | ICD-10-CM | POA: Diagnosis not present

## 2016-08-19 DIAGNOSIS — R1032 Left lower quadrant pain: Secondary | ICD-10-CM | POA: Diagnosis not present

## 2016-08-19 LAB — GASTROINTESTINAL PATHOGEN PANEL PCR
C. difficile Tox A/B, PCR: NOT DETECTED
CAMPYLOBACTER, PCR: NOT DETECTED
CRYPTOSPORIDIUM, PCR: NOT DETECTED
E COLI (STEC) STX1/STX2, PCR: NOT DETECTED
E coli (ETEC) LT/ST PCR: NOT DETECTED
E coli 0157, PCR: NOT DETECTED
Giardia lamblia, PCR: NOT DETECTED
NOROVIRUS, PCR: NOT DETECTED
ROTAVIRUS, PCR: NOT DETECTED
SALMONELLA, PCR: NOT DETECTED
Shigella, PCR: NOT DETECTED

## 2016-08-19 MED ORDER — IOPAMIDOL (ISOVUE-300) INJECTION 61%
100.0000 mL | Freq: Once | INTRAVENOUS | Status: AC | PRN
  Administered 2016-08-19: 100 mL via INTRAVENOUS

## 2016-08-19 NOTE — Telephone Encounter (Signed)
Pharmacy notified patient has already had her procedure

## 2016-08-24 ENCOUNTER — Inpatient Hospital Stay: Admission: RE | Admit: 2016-08-24 | Payer: BLUE CROSS/BLUE SHIELD | Source: Ambulatory Visit

## 2016-08-29 ENCOUNTER — Other Ambulatory Visit: Payer: Self-pay | Admitting: Radiology

## 2016-08-29 MED ORDER — GABAPENTIN 300 MG PO CAPS: 300.0000 mg | ORAL_CAPSULE | Freq: Every day | ORAL | 1 refills | Status: DC

## 2016-08-29 MED ORDER — GABAPENTIN 300 MG PO CAPS: 300.0000 mg | ORAL_CAPSULE | Freq: Three times a day (TID) | ORAL | 1 refills | Status: DC

## 2016-08-29 NOTE — Telephone Encounter (Signed)
05/16/16 last visit  08/19/16 next visit  Ok to refill Gabapentin per SD

## 2016-08-30 ENCOUNTER — Encounter: Payer: Self-pay | Admitting: Gastroenterology

## 2016-08-31 ENCOUNTER — Ambulatory Visit
Admission: RE | Admit: 2016-08-31 | Discharge: 2016-08-31 | Disposition: A | Discharge status: Home / Self Care | Payer: BLUE CROSS/BLUE SHIELD | Source: Ambulatory Visit | Attending: Obstetrics and Gynecology | Admitting: Obstetrics and Gynecology

## 2016-08-31 DIAGNOSIS — Z9889 Other specified postprocedural states: Secondary | ICD-10-CM

## 2016-08-31 DIAGNOSIS — Z1231 Encounter for screening mammogram for malignant neoplasm of breast: Secondary | ICD-10-CM

## 2016-09-01 ENCOUNTER — Encounter: Payer: Self-pay | Admitting: Podiatry

## 2016-09-01 ENCOUNTER — Ambulatory Visit (INDEPENDENT_AMBULATORY_CARE_PROVIDER_SITE_OTHER): Payer: BLUE CROSS/BLUE SHIELD | Admitting: Podiatry

## 2016-09-01 ENCOUNTER — Ambulatory Visit (INDEPENDENT_AMBULATORY_CARE_PROVIDER_SITE_OTHER): Payer: BLUE CROSS/BLUE SHIELD

## 2016-09-01 DIAGNOSIS — M2042 Other hammer toe(s) (acquired), left foot: Secondary | ICD-10-CM | POA: Diagnosis not present

## 2016-09-01 DIAGNOSIS — M21621 Bunionette of right foot: Secondary | ICD-10-CM

## 2016-09-01 DIAGNOSIS — M2041 Other hammer toe(s) (acquired), right foot: Secondary | ICD-10-CM | POA: Diagnosis not present

## 2016-09-01 DIAGNOSIS — M21629 Bunionette of unspecified foot: Secondary | ICD-10-CM

## 2016-09-01 NOTE — Progress Notes (Signed)
   Subjective:    Patient ID: Lauren Montes, female    DOB: [birth year], 60 y.o.   MRN: [id number]  HPI: She presents today chief complaint of painful hammertoes and tailor's bunion deformities bilateral. She states they've been bothering her for quite some time and has started to inhibit her ability to perform her daily activities and wear regular shoe gear.  Review of Systems  All other systems reviewed and are negative.      Objective:   Physical Exam: Vital signs are stable she is alert and oriented 3 have reviewed her past medical history medications allergies surgeon social history. Pulses are strongly palpable. Neurologic sensorium is intact. Deep tendon reflexes are intact. Muscle strength is +5 over 5 dorsiflexion plantar flexors and inverters and evertors on to the musculature is intact. Orthopedic evaluation demonstrates all joints distal to the ankle for range of motion or crepitation. She has tailor bunion deformities bilateral. Hammertoe deformity fifth digit right foot. Lateral exostosis hallux right. All of this was confirmed on radiographs.        Assessment & Plan:  Taylor's bunion deformities bilateral. Hammertoe deformity bilateral.  Plan: We consented her today for fifth metatarsal osteotomies and hammertoe repairs. She signed the consent we did discuss the possible postoperative complications and she understands this is amenable to it. Dispense Darco shoes postoperative and preoperative paperwork and discussed that this will be on an outpatient surgical setting and performed a [place] with sedation and local anesthetic. Follow-up with her in the near future.

## 2016-09-01 NOTE — Patient Instructions (Signed)
Pre-Operative Instructions  Congratulations, you have decided to take an important step to improving your quality of life.  You can be assured that the doctors of [facility] will be with you every step of the way.  1. Plan to be at the surgery center/hospital at least 1 (one) hour prior to your scheduled time unless otherwise directed by the surgical center/hospital staff.  You must have a responsible adult accompany you, remain during the surgery and drive you home.  Make sure you have directions to the surgical center/hospital and know how to get there on time. 2. For hospital based surgery you will need to obtain a history and physical form from your family physician within 1 month prior to the date of surgery- we will give you a form for you primary physician.  3. We make every effort to accommodate the date you request for surgery.  There are however, times where surgery dates or times have to be moved.  We will contact you as soon as possible if a change in schedule is required.   4. No Aspirin/Ibuprofen for one week before surgery.  If you are on aspirin, any non-steroidal anti-inflammatory medications (Mobic, Aleve, Ibuprofen) you should stop taking it 7 days prior to your surgery.  You make take Tylenol  For pain prior to surgery.  5. Medications- If you are taking daily heart and blood pressure medications, seizure, reflux, allergy, asthma, anxiety, pain or diabetes medications, make sure the surgery center/hospital is aware before the day of surgery so they may notify you which medications to take or avoid the day of surgery. 6. No food or drink after midnight the night before surgery unless directed otherwise by surgical center/hospital staff. 7. No alcoholic beverages 24 hours prior to surgery.  No smoking 24 hours prior to or 24 hours after surgery. 8. Wear loose pants or shorts- loose enough to fit over bandages, boots, and casts. 9. No slip on shoes, sneakers are best. 10. Bring  your boot with you to the surgery center/hospital.  Also bring crutches or a walker if your physician has prescribed it for you.  If you do not have this equipment, it will be provided for you after surgery. 11. If you have not been contracted by the surgery center/hospital by the day before your surgery, call to confirm the date and time of your surgery. 12. Leave-time from work may vary depending on the type of surgery you have.  Appropriate arrangements should be made prior to surgery with your employer. 13. Prescriptions will be provided immediately following surgery by your doctor.  Have these filled as soon as possible after surgery and take the medication as directed. 14. Remove nail polish on the operative foot. 15. Wash the night before surgery.  The night before surgery wash the foot and leg well with the antibacterial soap provided and water paying special attention to beneath the toenails and in between the toes.  Rinse thoroughly with water and dry well with a towel.  Perform this wash unless told not to do so by your physician.  Enclosed: 1 Ice pack (please put in freezer the night before surgery)   1 Hibiclens skin cleaner   Pre-op Instructions  If you have any questions regarding the instructions, do not hesitate to call our office.  [place]: [address] [place], [place] [address] [phone]  [place]: [address] [place], [place] [address] [phone]  [place]: [address]  [place], [place] [address] [phone]   Dr.   Norman Regal DPM, Dr. Matthew Wagoner DPM, Dr. M. Todd Hyatt DPM, Dr. Titorya Stover DPM 

## 2016-09-08 ENCOUNTER — Telehealth: Payer: Self-pay | Admitting: Radiology

## 2016-09-08 MED ORDER — TIZANIDINE HCL 4 MG PO TABS: ORAL_TABLET | ORAL | 0 refills | Status: DC

## 2016-09-08 NOTE — Telephone Encounter (Signed)
Tizanidine refill request received

## 2016-09-08 NOTE — Telephone Encounter (Signed)
Last visit 05/16/16 Next visit 10/19/16 Ok to refill per Dr Estanislado Pandy

## 2016-09-15 ENCOUNTER — Telehealth: Payer: Self-pay | Admitting: Cardiovascular Disease

## 2016-09-15 NOTE — Telephone Encounter (Signed)
Spoke with patient and rescheduled her office visit with Dr. Acie Fredrickson on 11/20 to 1:30 due to a meeting he has that afternoon.  Patient verbalized understanding and agreement.

## 2016-09-15 NOTE — Telephone Encounter (Signed)
New message  Pt call stating she received a message to call back RN. Please call back to discuss

## 2016-09-26 ENCOUNTER — Ambulatory Visit: Payer: BLUE CROSS/BLUE SHIELD | Admitting: Cardiovascular Disease

## 2016-09-26 ENCOUNTER — Ambulatory Visit (INDEPENDENT_AMBULATORY_CARE_PROVIDER_SITE_OTHER): Payer: BLUE CROSS/BLUE SHIELD | Admitting: Cardiovascular Disease

## 2016-09-26 ENCOUNTER — Encounter: Payer: Self-pay | Admitting: Cardiovascular Disease

## 2016-09-26 VITALS — BP 100/60 | HR 68 | Ht 66.0 in | Wt 141.4 lb

## 2016-09-26 DIAGNOSIS — E782 Mixed hyperlipidemia: Secondary | ICD-10-CM

## 2016-09-26 DIAGNOSIS — I471 Supraventricular tachycardia: Secondary | ICD-10-CM | POA: Insufficient documentation

## 2016-09-26 LAB — COMPREHENSIVE METABOLIC PANEL
ALBUMIN: 4.6 g/dL (ref 3.6–5.1)
ALT: 17 U/L (ref 6–29)
AST: 22 U/L (ref 10–35)
Alkaline Phosphatase: 83 U/L (ref 33–130)
BILIRUBIN TOTAL: 0.6 mg/dL (ref 0.2–1.2)
BUN: 10 mg/dL (ref 7–25)
CO2: 28 mmol/L (ref 20–31)
CREATININE: 0.7 mg/dL (ref 0.50–0.99)
Calcium: 9.8 mg/dL (ref 8.6–10.4)
Chloride: 105 mmol/L (ref 98–110)
Glucose, Bld: 84 mg/dL (ref 65–99)
Potassium: 4 mmol/L (ref 3.5–5.3)
SODIUM: 142 mmol/L (ref 135–146)
TOTAL PROTEIN: 6.7 g/dL (ref 6.1–8.1)

## 2016-09-26 LAB — LIPID PANEL
Cholesterol: 129 mg/dL (ref <200)
HDL: 53 mg/dL (ref >50)
LDL Cholesterol: 62 mg/dL (ref <100)
Total CHOL/HDL Ratio: 2.4 Ratio (ref <5.0)
Triglycerides: 72 mg/dL (ref <150)
VLDL: 14 mg/dL (ref <30)

## 2016-09-26 MED ORDER — ROSUVASTATIN CALCIUM 5 MG PO TABS: 5.0000 mg | ORAL_TABLET | Freq: Every day | ORAL | 11 refills | Status: DC

## 2016-09-26 NOTE — Patient Instructions (Signed)
Medication Instructions:  CHANGE Crestor to 5 mg daily   Labwork: TODAY - complete metabolic panel, cholesterol   Testing/Procedures: None Ordered   Follow-Up: Your physician wants you to follow-up in: 1 year with Dr. Acie Fredrickson.  You will receive a reminder letter in the mail two months in advance. If you don't receive a letter, please call our office to schedule the follow-up appointment.   If you need a refill on your cardiac medications before your next appointment, please call your pharmacy.   Thank you for choosing [facility]! Christen Bame, RN [phone]

## 2016-09-26 NOTE — Progress Notes (Signed)
Lauren Montes Date of Birth  June 07, 1956 [facility] [address], [place]  [address] [phone]  Fax  [phone]  1. Hyperlipidemia 2. Sinus tachycardia, SVT 3. Chronic asthma     Lauren Montes is a middle-age female the history of mild diastolic dysfunction as well as leg edema. She also has a history of hypercholesterolemia.   Has a history of hyperlipidemia. We have had her on Crestor the past which she tolerated very well. Her insurance and he refused to pay for to try her on atorvastatin. This caused her to have lots of muscle aches as well as memory loss. We discontinued the atorvastatin at that time.  She has been recording her heart rate and blood pressure readings. Her heart rate is frequently above 100. He also has many readings in the 80-90 range.  Has bruising particularly on her hands and on her fingers. [name] never had any easy bleeding. She did not have any bleeding  complications. during her pregnancies or deliveries.  Feb. 4, 2014: Lauren Montes is doing well.  Her HR and BP have been well controlled.   June 17, 2013:  Lauren Montes is doing well.  She has started waking over the past several weeks.    June 21, 2014:  Lauren Montes is having some issues with DOE.    Perhaps slight chest pressure with exertion.  No pain. .  Sleeps on 2 pillows occasionally at night - not every night.  [name] very careful with her salt intake. She and her husband thinks most of her meals of scratch. She denies any leg swelling   Nov. 17, 2015:  Lauren Montes is doing well.  She is seen for chronic dyspnea. Her echo was normal.  myoview showed no ischemia Occasional heart racing.  Not severe.  Does not take any meds.  Resolves after several minutes. She is walking for exercise - 30-45 minutes 3 times a week.  Also do some strength training  Nov. 17, 2016: Overall doing ok Lots of stress - aging parents  Lots of muscle cramps in legs and  feet.   Feb. 27, 2017:  Lauren Montes is seen back  Cholesterol is back up - she was not able to takes atorvastatin or Pravachol because of severe muscle aches and cramping  And mental foggyness. She has  Tried  Crestor in the past and it caused muscle cramps as well . [name] now on Zetia.  May 18 , 2017:  Lauren Montes is seen today for a work in visit.  She has been having lots of swelling in her feet and hands ( feet > hands)  We gave her Lasix and Kdur to take for several days.  She took the Lasix and Kdur for 2 days .   Has not needed it sense  has had some fatigue  Has not really eaten any more salt.  This has been occuring off and of for the past 6 weeks.  Is having palpitations at night  She has had increased anxiety.  Still able to do all of her usual activities without dyspnea.   She recently stopped taking her low dose estrogen ( 2-3 weeks ago )   Nov. 20, 2017:     No issues,  No SVT Takes 1/2 zoloft a day - anxiety may have been plaing a part  Has really cut down on the cholesterol in her diet  Getting exercise. Has lost 20 lbs.  Would like to  decrease the crestor to every other day instead of daily .    Current Outpatient Prescriptions  Medication Sig Dispense Refill  . ARNUITY ELLIPTA 200 MCG/ACT AEPB Inhale 1 puff into the lungs daily.  5  . cetirizine (ZYRTEC) 10 MG tablet Take 10 mg by mouth daily.    . cycloSPORINE (RESTASIS) 0.05 % ophthalmic emulsion Place 1 drop into both eyes 2 (two) times daily.    Marland Kitchen dicyclomine (BENTYL) 10 MG capsule Take 1 capsule (10 mg total) by mouth 4 (four) times daily -  before meals and at bedtime. 120 capsule 5  . diltiazem (CARDIZEM CD) 120 MG 24 hr capsule Take 1 capsule (120 mg total) by mouth daily. 90 capsule 3  . gabapentin (NEURONTIN) 300 MG capsule Take 1 capsule (300 mg total) by mouth daily. 270 capsule 1  . montelukast (SINGULAIR) 10 MG tablet Take 10 mg by mouth at bedtime.      . Omega-3 Fatty Acids (OMEGA 3 PO) Take 1 capsule  by mouth daily.      Marland Kitchen PROAIR HFA 108 (90 BASE) MCG/ACT inhaler Inhale 2 puffs into the lungs every 6 (six) hours as needed for wheezing or shortness of breath.   11  . Probiotic Product (PROBIOTIC DAILY PO) Take by mouth.    . rosuvastatin (CRESTOR) 5 MG tablet Take 1 tablet by mouth every other day. 17 tablet 11  . sertraline (ZOLOFT) 50 MG tablet 1/2 TABLET ONCE DAILY  2  . tiZANidine (ZANAFLEX) 4 MG tablet Take one at bedtime prn 90 tablet 0   Current Facility-Administered Medications  Medication Dose Route Frequency Provider Last Rate Last Dose  . 0.9 %  sodium chloride infusion  500 mL Intravenous Continuous Ladene Artist, MD         Allergies  Allergen Reactions  . Codeine Nausea And Vomiting    Migraine headache  . Lipitor [Atorvastatin] Other (See Comments)    Muscle ache, mind foggy,   . Latex Hives and Rash    Past Medical History:  Diagnosis Date  . Asthma   . Atrophic vaginitis   . Blood transfusion without reported diagnosis 1975   with vaginal delivery--very anemic  . Cystic teratoma   . Elevated cholesterol   . Endometriosis   . Esophageal ulcer   . Fibromyalgia   . Fundic gland polyps of stomach, benign   . GERD (gastroesophageal reflux disease)   . History of diastolic dysfunction   . History of migraine headaches   . Leg edema    left leg  . Ocular rosacea   . Osteoarthritis    back,hands, knees  . Overactive bladder   . Raynaud's disease   . Tachycardia   . Tubular adenoma of colon 08/2016    Past Surgical History:  Procedure Laterality Date  . APPENDECTOMY    . BACK SURGERY    . BREAST SURGERY  10-09-13   breast reduction--Dr. Dessie Coma  . BUNIONECTOMY     bilateral  . CARDIOVASCULAR STRESS TEST  07/02/2008   EF 55-60%  . DENTAL SURGERY  2007  . FOOT SURGERY     LEFT FOOT  . left breast mass removed    . OOPHORECTOMY  1982   RSO partial and LSO, uterus  . RSO  2002   partial  . ruptured disc surgery  07,08  . TOTAL ABDOMINAL  HYSTERECTOMY      History  Smoking Status  . Never Smoker  Smokeless Tobacco  . Never Used  History  Alcohol Use  . 1.8 - 2.4 oz/week  . 3 - 4 Glasses of wine per week    Family History  Problem Relation Age of Onset  . Coronary artery disease Father   . Hypertension Father   . Heart disease Father   . Heart attack Father   . Asthma Mother   . Hypertension Mother   . Alzheimer's disease Maternal Grandmother   . Lung cancer Maternal Grandfather   . Heart attack Maternal Grandfather   . Heart failure Paternal Grandfather   . Heart attack Paternal Grandfather   . Skin cancer Sister     melanoma  . Hepatitis C Brother   . Skin cancer Brother     melanoma  . Pancreatic cancer Maternal Aunt   . Colon cancer Neg Hx   . Colon polyps Neg Hx   . Rectal cancer Neg Hx   . Stomach cancer Neg Hx     Reviw of Systems:  Reviewed in the HPI.  All other systems are negative.  Physical Exam: BP 100/60 (BP Location: Right Arm, Patient Position: Sitting, Cuff Size: Normal)   Pulse 68   Ht 5\' 6"  (1.676 m)   Wt 141 lb 6.4 oz (64.1 kg)   LMP 11/07/1980   BMI 22.82 kg/m  The patient is alert and oriented x 3.  The mood and affect are normal.   Skin: warm and dry.  Color is normal.    HEENT:   the sclera are nonicteric.  The mucous membranes are moist.  The carotids are 2+ without bruits.  There is no thyromegaly.  There is no JVD.    Lungs: clear.  The chest wall is non tender.    Heart: regular rate with a normal S1 and S2.  There are no murmurs, gallops, or rubs. The PMI is not displaced.     Abdomin: good bowel sounds.  There is no guarding or rebound.  There is no hepatosplenomegaly or tenderness.  There are no masses.   Extremities:  Trace edema.  The legs are without rashes.  The distal pulses are intact.   Neuro:  Cranial nerves II - XII are intact.  Motor and sensory functions are intact.    The gait is normal.  ECG:  Sinus brady at 59.  Otherwise normal     Assessment / Plan:   1. Acute on chronic diastolic CHF; Lauren Montes is done well. [name] not had any recent episodes of diastolic CHF.   2. Essential hypertension: Her blood pressure is well-controlled.  3. Hyperlipidemia:     She is tolerating Crestor 5 mg every other day .  Had singificant muscle aches with the daily dosing  She wants to try Co-Q 10  Check labs today .  She'll try to go back up to Crestor 5 g a day.  If she is successful in increasing her Crestor to 5 g a day will check fasting labs in 3 months.  4. Supraventricular  tachycardia: Stable. Takes propranolol twice a day     Mertie Moores, MD  09/26/2016 1:56 PM    [facility] [place],  [place] [place], [place]  [address] Pager [phone] Phone: [phone]; Fax: [phone]

## 2016-09-29 ENCOUNTER — Other Ambulatory Visit: Payer: Self-pay | Admitting: Cardiovascular Disease

## 2016-10-06 ENCOUNTER — Encounter: Payer: Self-pay | Admitting: Gastroenterology

## 2016-10-06 ENCOUNTER — Ambulatory Visit (INDEPENDENT_AMBULATORY_CARE_PROVIDER_SITE_OTHER): Payer: BLUE CROSS/BLUE SHIELD | Admitting: Gastroenterology

## 2016-10-06 VITALS — BP 118/80 | HR 78 | Ht 66.0 in | Wt 141.2 lb

## 2016-10-06 DIAGNOSIS — K582 Mixed irritable bowel syndrome: Secondary | ICD-10-CM | POA: Diagnosis not present

## 2016-10-06 DIAGNOSIS — H04123 Dry eye syndrome of bilateral lacrimal glands: Secondary | ICD-10-CM | POA: Diagnosis not present

## 2016-10-06 DIAGNOSIS — Z86010 Personal history of colonic polyps: Secondary | ICD-10-CM | POA: Diagnosis not present

## 2016-10-06 NOTE — Patient Instructions (Signed)
Continue your Bentyl and Miralax as needed.  Thank you for choosing me and [facility].  Pricilla Riffle. Dagoberto Ligas., MD., Marval Regal

## 2016-10-06 NOTE — Progress Notes (Signed)
    History of Present Illness: This is a 60 year old female returning for follow up of IBS. She feels well on current regimen with only mild constipation easily managed with Miralax.   Colonoscopy 08/2016 One 3 mm polyp in the cecum, removed with a cold biopsy forceps. Resected and retrieved. - Two 6 to 7 mm polyps in the descending colon and in the transverse colon, removed with a cold snare. Resected and retrieved. - The examined portion of the ileum was normal. - The examination was otherwise normal on direct and retroflexion views.  Pathology: tubular adenomas, normal colonic mucosa.  EGD 08/2016 - Normal esophagus. Dilated. - Multiple gastric polyps. Biopsied. - Normal duodenal bulb and second portion of the duodenum.  Pathology: benign fundic gland polyps  Current Medications, Allergies, Past Medical History, Past Surgical History, Family History and Social History were reviewed in [employer] record.  Physical Exam: General: Well developed, well nourished, no acute distress Head: Normocephalic and atraumatic Eyes:  sclerae anicteric, EOMI Ears: Normal auditory acuity Mouth: No deformity or lesions Lungs: Clear throughout to auscultation Heart: Regular rate and rhythm; no murmurs, rubs or bruits Abdomen: Soft, non tender and non distended. No masses, hepatosplenomegaly or hernias noted. Normal Bowel sounds Musculoskeletal: Symmetrical with no gross deformities  Pulses:  Normal pulses noted Extremities: No clubbing, cyanosis, edema or deformities noted Neurological: Alert oriented x 4, grossly nonfocal Psychological:  Alert and cooperative. Normal mood and affect  Assessment and Recommendations:  1. IBS with alternating bowel habits. Abdominal pain and bowel function currently under very good control with diet and dicyclomine 10 mg ac and hs. Miralax daily prn constipation and Imodium daily prn diarrhea. REV in 1 year.   2. Hx of adenomatous colon  polyps. 5 year interval surveillance colonoscopy recommended.   I spent 15 minutes of face-to-face time with the patient. Greater than 50% of the time was spent counseling and coordinating care.

## 2016-10-12 ENCOUNTER — Other Ambulatory Visit: Payer: Self-pay | Admitting: Podiatry

## 2016-10-12 MED ORDER — ONDANSETRON HCL 4 MG PO TABS: 4.0000 mg | ORAL_TABLET | Freq: Three times a day (TID) | ORAL | 0 refills | Status: DC | PRN

## 2016-10-12 MED ORDER — CEPHALEXIN 500 MG PO CAPS: 500.0000 mg | ORAL_CAPSULE | Freq: Three times a day (TID) | ORAL | 0 refills | Status: DC

## 2016-10-12 MED ORDER — HYDROMORPHONE HCL 4 MG PO TABS: 4.0000 mg | ORAL_TABLET | ORAL | 0 refills | Status: DC | PRN

## 2016-10-14 ENCOUNTER — Encounter: Payer: Self-pay | Admitting: Podiatry

## 2016-10-14 DIAGNOSIS — M2042 Other hammer toe(s) (acquired), left foot: Secondary | ICD-10-CM | POA: Diagnosis not present

## 2016-10-14 DIAGNOSIS — M21542 Acquired clubfoot, left foot: Secondary | ICD-10-CM | POA: Diagnosis not present

## 2016-10-14 DIAGNOSIS — M2041 Other hammer toe(s) (acquired), right foot: Secondary | ICD-10-CM | POA: Diagnosis not present

## 2016-10-14 DIAGNOSIS — M24574 Contracture, right foot: Secondary | ICD-10-CM | POA: Diagnosis not present

## 2016-10-14 DIAGNOSIS — M21541 Acquired clubfoot, right foot: Secondary | ICD-10-CM | POA: Diagnosis not present

## 2016-10-14 DIAGNOSIS — M2021 Hallux rigidus, right foot: Secondary | ICD-10-CM | POA: Diagnosis not present

## 2016-10-14 DIAGNOSIS — R000 Tachycardia, unspecified: Secondary | ICD-10-CM | POA: Diagnosis not present

## 2016-10-14 DIAGNOSIS — M21622 Bunionette of left foot: Secondary | ICD-10-CM | POA: Diagnosis not present

## 2016-10-14 DIAGNOSIS — M21621 Bunionette of right foot: Secondary | ICD-10-CM | POA: Diagnosis not present

## 2016-10-14 DIAGNOSIS — M25774 Osteophyte, right foot: Secondary | ICD-10-CM | POA: Diagnosis not present

## 2016-10-14 DIAGNOSIS — M7751 Other enthesopathy of right foot: Secondary | ICD-10-CM | POA: Diagnosis not present

## 2016-10-14 DIAGNOSIS — D1631 Benign neoplasm of short bones of right lower limb: Secondary | ICD-10-CM | POA: Diagnosis not present

## 2016-10-18 ENCOUNTER — Telehealth: Payer: Self-pay

## 2016-10-18 NOTE — Telephone Encounter (Signed)
Spoke with pt regarding post op state. She states that her Rt foot is doing well. Her Lt foot is sore, she has not taken any pain medications in the past 48 hours and she feels well. Advised on boot usage and elevation. Any acute symptom changes should be reported immediatley

## 2016-10-19 ENCOUNTER — Ambulatory Visit: Payer: BLUE CROSS/BLUE SHIELD | Admitting: Rheumatology

## 2016-10-20 ENCOUNTER — Ambulatory Visit (INDEPENDENT_AMBULATORY_CARE_PROVIDER_SITE_OTHER): Payer: BLUE CROSS/BLUE SHIELD | Admitting: Podiatry

## 2016-10-20 ENCOUNTER — Encounter: Payer: Self-pay | Admitting: Podiatry

## 2016-10-20 ENCOUNTER — Ambulatory Visit (INDEPENDENT_AMBULATORY_CARE_PROVIDER_SITE_OTHER): Payer: BLUE CROSS/BLUE SHIELD

## 2016-10-20 VITALS — Temp 98.3°F

## 2016-10-20 DIAGNOSIS — M21629 Bunionette of unspecified foot: Secondary | ICD-10-CM

## 2016-10-20 DIAGNOSIS — M2042 Other hammer toe(s) (acquired), left foot: Secondary | ICD-10-CM

## 2016-10-20 DIAGNOSIS — M2041 Other hammer toe(s) (acquired), right foot: Secondary | ICD-10-CM

## 2016-10-20 NOTE — Progress Notes (Signed)
DOS 12.08.2017 1.5th Metatarsal Osteotomy with Screw (both) 2.Derotational Arthroplasty 5th Toe Right 3.Exostectomy Hallux (lateral) Right 4.Hammertoe  Repair 2nd Toe with Screw or Pin Left 5.Release 2nd MTPJ Left

## 2016-10-21 DIAGNOSIS — L309 Dermatitis, unspecified: Secondary | ICD-10-CM | POA: Diagnosis not present

## 2016-10-23 NOTE — Progress Notes (Signed)
She presents today for her first postop visit date of surgery was 10/14/2016. Fifth metatarsal osteotomies bilateral release of the second metatarsophalangeal joints hammertoe repair fifth digit right and second digit left exostectomy first right. She states the right foot is doing well the left foot has some pain on the bottom.  Objective: Vital signs are stable she is alert and oriented 3 dry sterile dressings are intact with no bleeding through. She has a mild rash to her legs where the DuraPrep was placed. I see no signs of infection sutures are intact margins well coapted good range of motion bilateral foot radiographs confirm well-healing osteotomies.  Assessment: Well-healing surgical foot bilateral.  Plan: Follow up with her in 1 week for suture removal redressed today dry sterile compressive dressing continue use of the Darco shoes.

## 2016-11-03 ENCOUNTER — Ambulatory Visit (INDEPENDENT_AMBULATORY_CARE_PROVIDER_SITE_OTHER): Payer: BLUE CROSS/BLUE SHIELD | Admitting: Podiatry

## 2016-11-03 DIAGNOSIS — M2041 Other hammer toe(s) (acquired), right foot: Secondary | ICD-10-CM

## 2016-11-03 DIAGNOSIS — M779 Enthesopathy, unspecified: Secondary | ICD-10-CM

## 2016-11-03 DIAGNOSIS — M2042 Other hammer toe(s) (acquired), left foot: Secondary | ICD-10-CM

## 2016-11-03 DIAGNOSIS — M7751 Other enthesopathy of right foot: Secondary | ICD-10-CM

## 2016-11-03 DIAGNOSIS — M21621 Bunionette of right foot: Secondary | ICD-10-CM

## 2016-11-03 DIAGNOSIS — Z9889 Other specified postprocedural states: Secondary | ICD-10-CM

## 2016-11-03 DIAGNOSIS — M778 Other enthesopathies, not elsewhere classified: Secondary | ICD-10-CM

## 2016-11-03 DIAGNOSIS — M21629 Bunionette of unspecified foot: Secondary | ICD-10-CM

## 2016-11-04 NOTE — Progress Notes (Signed)
Subjective: Lauren Montes is a 60 y.o. is seen today in office s/p bilateral 5th metatarsal osteotomies, 2nd MPTJ release, right hallux exostectomy and left 2nd hammertoe repair preformed on 10/14/16. She states that she's having minimal pain and she is not taking any pain medication. She is continuing with surgical shoes. Denies any systemic complaints such as fevers, chills, nausea, vomiting. No calf pain, chest pain, shortness of breath.   Objective: General: No acute distress, AAOx3  DP/PT pulses palpable 2/4, CRT < 3 sec to all digits.  Protective sensation intact. Motor function intact.  Bilateral feet: Incision is well coapted without any evidence of dehiscence and sutures are intact. It is to be slight gapping along the hammertoe incision in the left foot. There is no surrounding erythema, ascending cellulitis, fluctuance, crepitus, malodor, drainage/purulence. There is minimal edema around the surgical site. There is no pain along the surgical site. K wire intact the left second toe. Toes are rectus position. No other areas of tenderness to bilateral lower extremities.  No other open lesions or pre-ulcerative lesions.  No pain with calf compression, swelling, warmth, erythema.   Assessment and Plan:  Status post bilateral foot surgery, doing well with no complications   -Treatment options discussed including all alternatives, risks, and complications -Sutures were removed today except for the hammertoe incision as there was mild movement of the incision. Antibiotic 1 and was applied followed by a bandage. Hold off on the left foot wet quite yet. She is to shower on the right foot tomorrow but do not soak. Continue antibiotic ointment dressing changes to the incisions. -Continue surgical shoes. -Ice/elevation -Pain medication as needed. -Monitor for any clinical signs or symptoms of infection and DVT/PE and directed to call the office immediately should any occur or go to the  ER. -Follow-up in 1 week to remove the remainder of the sutures or sooner if any problems arise. In the meantime, encouraged to call the office with any questions, concerns, change in symptoms.   Celesta Gentile, DPM

## 2016-11-10 ENCOUNTER — Ambulatory Visit (INDEPENDENT_AMBULATORY_CARE_PROVIDER_SITE_OTHER): Payer: BLUE CROSS/BLUE SHIELD | Admitting: Podiatry

## 2016-11-10 ENCOUNTER — Ambulatory Visit (INDEPENDENT_AMBULATORY_CARE_PROVIDER_SITE_OTHER): Payer: BLUE CROSS/BLUE SHIELD

## 2016-11-10 DIAGNOSIS — M2042 Other hammer toe(s) (acquired), left foot: Secondary | ICD-10-CM

## 2016-11-10 DIAGNOSIS — M2041 Other hammer toe(s) (acquired), right foot: Secondary | ICD-10-CM

## 2016-11-10 DIAGNOSIS — Z9889 Other specified postprocedural states: Secondary | ICD-10-CM

## 2016-11-10 DIAGNOSIS — M21629 Bunionette of unspecified foot: Secondary | ICD-10-CM | POA: Diagnosis not present

## 2016-11-13 NOTE — Progress Notes (Signed)
She presents today for surgical follow-up regarding metatarsal osteotomy fifth bilateral hammertoe repair fifth bilateral release the second metatarsophalangeal joint with hammertoe repair left foot. And exostectomy hallux right. She states that she is doing well no complications.  Objective: Signs are stable alert and oriented 3. Pulses are palpable. Neurologic sensorium is intact. Surgical sites. Be healing very nicely some mild edema she is wearing regular shoes.  Assessment: No open lesions or wounds were healing surgical foot bilateral.  Plan: Follow up with me in 1-2 weeks

## 2016-11-17 ENCOUNTER — Ambulatory Visit: Payer: BLUE CROSS/BLUE SHIELD

## 2016-11-24 ENCOUNTER — Ambulatory Visit: Payer: BLUE CROSS/BLUE SHIELD | Admitting: Rheumatology

## 2016-11-24 ENCOUNTER — Ambulatory Visit: Payer: BLUE CROSS/BLUE SHIELD

## 2016-11-26 ENCOUNTER — Ambulatory Visit (INDEPENDENT_AMBULATORY_CARE_PROVIDER_SITE_OTHER): Payer: BLUE CROSS/BLUE SHIELD | Admitting: Rheumatology

## 2016-11-26 ENCOUNTER — Ambulatory Visit: Payer: BLUE CROSS/BLUE SHIELD | Admitting: Rheumatology

## 2016-11-26 ENCOUNTER — Encounter: Payer: Self-pay | Admitting: Rheumatology

## 2016-11-26 VITALS — BP 120/60 | HR 74 | Resp 14 | Ht 66.0 in | Wt 140.0 lb

## 2016-11-26 DIAGNOSIS — I7300 Raynaud's syndrome without gangrene: Secondary | ICD-10-CM

## 2016-11-26 DIAGNOSIS — F5101 Primary insomnia: Secondary | ICD-10-CM | POA: Diagnosis not present

## 2016-11-26 DIAGNOSIS — R5382 Chronic fatigue, unspecified: Secondary | ICD-10-CM | POA: Diagnosis not present

## 2016-11-26 DIAGNOSIS — M797 Fibromyalgia: Secondary | ICD-10-CM

## 2016-11-26 MED ORDER — TIZANIDINE HCL 4 MG PO TABS: ORAL_TABLET | ORAL | 1 refills | Status: DC

## 2016-11-26 MED ORDER — GABAPENTIN 300 MG PO CAPS: 300.0000 mg | ORAL_CAPSULE | Freq: Two times a day (BID) | ORAL | 1 refills | Status: DC

## 2016-11-26 NOTE — Progress Notes (Signed)
Office Visit Note  Patient: Lauren Montes             Date of Birth: 08-03-1956           MRN: [id number]             PCP: Haywood Pao, MD Referring: Haywood Pao, MD Visit Date: 11/26/2016 Occupation: @GUAROCC @    Subjective:  Follow-up Follow-up on fibromyalgia, refills  History of Present Illness: Lauren Montes is a 61 y.o. female  Last seen 05/16/2016 Patient's fibromyalgia is doing well. She rates her discomfort as 2 on a scale of 0-10. [name] taking Zoloft and she is tolerating it well. My suspicion is that her Zoloft is helping her fibromyalgia.  She also has Raynaud's which flared during the New Year's. She had a flare for about one week. She does wear gloves to help her during the weather. She is not on aspirin and I've advised her to use enteric-coated aspirin 81 mg daily. She is agreeable.  She is a history of degenerative disc disease of the L-spine. [name] had surgery for this. She does well with the physical therapist. They are doing exercises for her whole body but also for her back muscles. [name] had physical therapy for her postsurgical rehabilitation and knows the right exercises to do for her back.  She requests a refill on ties ranitidine and gabapentin.  She has hammertoes and her left second toe was surgically addressed by Dr. Milinda Pointer at [facility]. This was done about 6 weeks ago and patient is doing well. She has not had any flare from her fibromyalgia despite having surgery done.   Activities of Daily Living:  Patient reports morning stiffness for 15 minutes.   Patient Denies nocturnal pain.  Difficulty dressing/grooming: Denies Difficulty climbing stairs: Denies Difficulty getting out of chair: Denies Difficulty using hands for taps, buttons, cutlery, and/or writing: Denies   Review of Systems  Constitutional: Positive for fatigue.  HENT: Negative for mouth sores and mouth dryness.   Eyes: Negative for dryness.    Respiratory: Negative for shortness of breath.   Gastrointestinal: Negative for constipation and diarrhea.  Musculoskeletal: Positive for myalgias and myalgias.  Skin: Negative for sensitivity to sunlight.  Psychiatric/Behavioral: Positive for sleep disturbance. Negative for decreased concentration.    PMFS History:  Patient Active Problem List   Diagnosis Date Noted  . SVT (supraventricular tachycardia) ([place]) 09/26/2016  . HTN (hypertension) 09/24/2015  . Palpitations 03/05/2015  . Fibromyalgia 03/05/2015  . Chronic diastolic CHF (congestive heart failure), NYHA class 2 ([place]) 06/20/2014  . Sinus tachycardia 09/14/2012  . Dysphagia 06/29/2012  . Ocular rosacea   . GERD (gastroesophageal reflux disease)   . Cough 09/09/2011  . Chest tightness, discomfort, or pressure 09/09/2011  . Hyperlipidemia 05/10/2011  . SWELLING OF LIMB 01/20/2011  . GERD 02/03/2010  . ABDOMINAL PAIN-EPIGASTRIC 02/03/2010  . ABDOMINAL PAIN-MULTIPLE SITES 02/03/2010  . CONSTIPATION 09/19/2008  . ABDOMINAL PAIN, LEFT LOWER QUADRANT 09/19/2008  . COLONIC POLYPS, HYPERPLASTIC, HX OF 09/19/2008    Past Medical History:  Diagnosis Date  . Asthma   . Atrophic vaginitis   . Blood transfusion without reported diagnosis 1975   with vaginal delivery--very anemic  . Cystic teratoma   . Elevated cholesterol   . Endometriosis   . Esophageal ulcer   . Fibromyalgia   . Fundic gland polyps of stomach, benign   . GERD (gastroesophageal reflux disease)   . History of diastolic dysfunction   .  History of migraine headaches   . Leg edema    left leg  . Ocular rosacea   . Osteoarthritis    back,hands, knees  . Overactive bladder   . Raynaud's disease   . Tachycardia   . Tubular adenoma of colon 08/2016    Family History  Problem Relation Age of Onset  . Coronary artery disease Father   . Hypertension Father   . Heart disease Father   . Heart attack Father   . Asthma Mother   . Hypertension Mother   .  Alzheimer's disease Maternal Grandmother   . Lung cancer Maternal Grandfather   . Heart attack Maternal Grandfather   . Heart failure Paternal Grandfather   . Heart attack Paternal Grandfather   . Skin cancer Sister     melanoma  . Hepatitis C Brother   . Skin cancer Brother     melanoma  . Pancreatic cancer Maternal Aunt   . Colon cancer Neg Hx   . Colon polyps Neg Hx   . Rectal cancer Neg Hx   . Stomach cancer Neg Hx    Past Surgical History:  Procedure Laterality Date  . APPENDECTOMY    . BACK SURGERY    . BREAST SURGERY  10-09-13   breast reduction--Dr. Dessie Coma  . BUNIONECTOMY     bilateral  . CARDIOVASCULAR STRESS TEST  07/02/2008   EF 55-60%  . colonscopy    . DENTAL SURGERY  2007  . ESOPHAGOGASTRODUODENOSCOPY ENDOSCOPY    . FOOT SURGERY     LEFT FOOT  . left breast mass removed    . OOPHORECTOMY  1982   RSO partial and LSO, uterus  . RSO  2002   partial  . ruptured disc surgery  07,08  . TOTAL ABDOMINAL HYSTERECTOMY     Social History   Social History Narrative  . No narrative on file     Objective: Vital Signs: BP 120/60   Pulse (!) 14   Resp 14   Ht 5' 6"  (1.676 m)   Wt 140 lb (63.5 kg)   LMP 11/07/1980   BMI 22.60 kg/m    Physical Exam  Nursing note and vitals reviewed.    Musculoskeletal Exam:  Full range of motion of all joints Grip strength is equal and strong bilaterally For myalgia tender points are 2 out of 18 positive. Bilateral trapezius muscles are slightly painful  CDAI Exam: CDAI Homunculus Exam:   Joint Counts:  CDAI Tender Joint count: 0 CDAI Swollen Joint count: 0  Global Assessments:  Patient Global Assessment: 2 Provider Global Assessment: 2  CDAI Calculated Score: 4  No synovitis  Investigation: No additional findings.  Office Visit on 09/26/2016  Component Date Value Ref Range Status  . Cholesterol 09/26/2016 129  <200 mg/dL Final   Comment: ** Please note change in reference range(s). **     .  Triglycerides 09/26/2016 72  <150 mg/dL Final   Comment: ** Please note change in reference range(s). **     . HDL 09/26/2016 53  >50 mg/dL Final   Comment: ** Please note change in reference range(s). **     . Total CHOL/HDL Ratio 09/26/2016 2.4  <5.0 Ratio Final  . VLDL 09/26/2016 14  <30 mg/dL Final  . LDL Cholesterol 09/26/2016 62  <100 mg/dL Final   Comment: ** Please note change in reference range(s). **     . Sodium 09/26/2016 142  135 - 146 mmol/L Final  . Potassium 09/26/2016 4.0  3.5 - 5.3 mmol/L Final  . Chloride 09/26/2016 105  98 - 110 mmol/L Final  . CO2 09/26/2016 28  20 - 31 mmol/L Final  . Glucose, Bld 09/26/2016 84  65 - 99 mg/dL Final  . BUN 09/26/2016 10  7 - 25 mg/dL Final  . Creat 09/26/2016 0.70  0.50 - 0.99 mg/dL Final   Comment:   For patients > or = 61 years of age: The upper reference limit for Creatinine is approximately 13% higher for people identified as African-American.     . Total Bilirubin 09/26/2016 0.6  0.2 - 1.2 mg/dL Final  . Alkaline Phosphatase 09/26/2016 83  33 - 130 U/L Final  . AST 09/26/2016 22  10 - 35 U/L Final  . ALT 09/26/2016 17  6 - 29 U/L Final  . Total Protein 09/26/2016 6.7  6.1 - 8.1 g/dL Final  . Albumin 09/26/2016 4.6  3.6 - 5.1 g/dL Final  . Calcium 09/26/2016 9.8  8.6 - 10.4 mg/dL Final  Appointment on 08/17/2016  Component Date Value Ref Range Status  . Campylobacter, PCR 08/17/2016 Not Detected   Final  . C. difficile Tox A/B, PCR 08/17/2016 Not Detected   Final  . E coli 0157, PCR 08/17/2016 Not Detected   Final  . E coli (ETEC) LT/ST PCR 08/17/2016 Not Detected   Final  . E coli (STEC) stx1/stx2, PCR 08/17/2016 Not Detected   Final  . Salmonella, PCR 08/17/2016 Not Detected   Final  . Shigella, PCR 08/17/2016 Not Detected   Final  . Norovirus, PCR 08/17/2016 Not Detected   Final  . Rotavirus A, PCR 08/17/2016 Not Detected   Final  . Giardia lamblia, PCR 08/17/2016 Not Detected   Final  . Cryptosporidium, PCR  08/17/2016 Not Detected   Final   Comment:     ** Normal Reference Range for each Analyte: Not Detected **         ** The xTAG Gastrointestinal Pathogen Panel results are presumptive and must be confirmed by FDA-cleared tests or other acceptable reference methods.  The results of this test should not be used as the sole basis for diagnosis, treatment, or other patient management decisions.   Performed using the Lum[employer] xTAG Gastrointestinal Pathogen Panel test kit.   Appointment on 08/15/2016  Component Date Value Ref Range Status  . Sodium 08/15/2016 142  135 - 145 mEq/L Final  . Potassium 08/15/2016 4.2  3.5 - 5.1 mEq/L Final  . Chloride 08/15/2016 107  96 - 112 mEq/L Final  . CO2 08/15/2016 26  19 - 32 mEq/L Final  . Glucose, Bld 08/15/2016 98  70 - 99 mg/dL Final  . BUN 08/15/2016 9  6 - 23 mg/dL Final  . Creatinine, Ser 08/15/2016 0.73  0.40 - 1.20 mg/dL Final  . Total Bilirubin 08/15/2016 0.3  0.2 - 1.2 mg/dL Final  . Alkaline Phosphatase 08/15/2016 101  39 - 117 U/L Final  . AST 08/15/2016 28  0 - 37 U/L Final  . ALT 08/15/2016 23  0 - 35 U/L Final  . Total Protein 08/15/2016 7.3  6.0 - 8.3 g/dL Final  . Albumin 08/15/2016 4.5  3.5 - 5.2 g/dL Final  . Calcium 08/15/2016 9.8  8.4 - 10.5 mg/dL Final  . GFR 08/15/2016 86.32  >60.00 mL/min Final  . WBC 08/15/2016 7.1  4.0 - 10.5 K/uL Final  . RBC 08/15/2016 4.55  3.87 - 5.11 Mil/uL Final  . Hemoglobin 08/15/2016 13.9  12.0 - 15.0 g/dL  Final  . HCT 08/15/2016 41.4  36.0 - 46.0 % Final  . MCV 08/15/2016 91.1  78.0 - 100.0 fl Final  . MCHC 08/15/2016 33.6  30.0 - 36.0 g/dL Final  . RDW 08/15/2016 12.6  11.5 - 15.5 % Final  . Platelets 08/15/2016 248.0  150.0 - 400.0 K/uL Final  . Neutrophils Relative % 08/15/2016 54.9  43.0 - 77.0 % Final  . Lymphocytes Relative 08/15/2016 36.4  12.0 - 46.0 % Final  . Monocytes Relative 08/15/2016 7.1  3.0 - 12.0 % Final  . Eosinophils Relative 08/15/2016 1.1  0.0 - 5.0 % Final  . Basophils  Relative 08/15/2016 0.5  0.0 - 3.0 % Final  . Neutro Abs 08/15/2016 3.9  1.4 - 7.7 K/uL Final  . Lymphs Abs 08/15/2016 2.6  0.7 - 4.0 K/uL Final  . Monocytes Absolute 08/15/2016 0.5  0.1 - 1.0 K/uL Final  . Eosinophils Absolute 08/15/2016 0.1  0.0 - 0.7 K/uL Final  . Basophils Absolute 08/15/2016 0.0  0.0 - 0.1 K/uL Final  . TSH 08/15/2016 2.26  0.35 - 4.50 uIU/mL Final  . Sed Rate 08/15/2016 19  0 - 30 mm/hr Final  . CRP 08/15/2016 0.4* 0.5 - 20.0 mg/dL Final  . Tissue Transglutaminase Ab, IgA 08/15/2016 1  <4 U/mL Final   Comment: Value Interpretation:   <4:   Antibody Not Detected  >=4:   Antibody Detected   . IgA 08/15/2016 116  68 - 378 mg/dL Final  . Lipase 08/15/2016 17.0  11.0 - 59.0 U/L Final  Office Visit on 08/10/2016  Component Date Value Ref Range Status  . Color, UA 08/10/2016 yellow   Final  . Clarity, UA 08/10/2016 clear   Final  . Glucose, UA 08/10/2016 n   Final  . Bilirubin, UA 08/10/2016 n   Final  . Ketones, UA 08/10/2016 n   Final  . Blood, UA 08/10/2016 n   Final  . pH, UA 08/10/2016 5.0   Final  . Protein, UA 08/10/2016 n   Final  . Urobilinogen, UA 08/10/2016 negative   Final  . Nitrite, UA 08/10/2016 n   Final  . Leukocytes, UA 08/10/2016 Negative  Negative Final  . Candida species 08/10/2016 NEG  Negative Final  . Trichomonas vaginosis 08/10/2016 NEG  Negative Final  . Gardnerella vaginalis 08/10/2016 NEG  Negative Final     Imaging: Dg Foot Complete Left  Result Date: 11/11/2016 Please see detailed radiograph report in office note.  Dg Foot Complete Right  Result Date: 11/11/2016 Please see detailed radiograph report in office note.   Speciality Comments: No specialty comments available.    Procedures:  No procedures performed Allergies: Codeine; Lipitor [atorvastatin]; and Latex   Assessment / Plan:     Visit Diagnoses: Fibromyalgia  Chronic fatigue  Primary insomnia  Raynaud's disease without gangrene   Plan: #1: Patient's  fibromyalgia is doing really well. She rates her discomfort as 2 on a scale of 0-10. I suspect that the Zoloft that she is using is helping her fiber myalgia.  #2: She recently had surgery for hammertoe on the left second toe through Dr. Stephenie Acres office and try it for tender. She is doing really well. The surgery was 6 weeks ago.  #3: She requests a refill on at times and 89 gabapentin. Ninety-day supply with a refill. I'm agreeable.  #4: She is doing well with her degenerative disc disease of the lumbar spine. She has some discomfort and she is using her physical therapist  to help her with this. [name] had surgery for this in the past and she knows the proper exercises to do because she did them for rehabilitation after her surgery.  #5: She has a history of Raynaud's and she's not taking aspirin every day. Advise her to take 81 mg of enteric-coated aspirin daily. Patient is agreeable. She will benefit from this medication especially in the wintertime she can take it year round.  #6: She wants to taper off of the gabapentin. I'm agreeable but right now she is doing so well I have advised her to address this at the next visit in 6 months. She didn't see his taking the minimal amount of gabapentin and Zoloft anti-tizanidine, I feel that this combination is addressing most of her fibromyalgia symptoms well and may not want to make a change. However, it is reasonable in the future to see if she can use a little bit less gabapentin Note that she started Zoloft approximately August and September 2017 and has noticed an improvement in her fibromyalgia ever since then.  #7: Return to clinic in 6 months Orders: No orders of the defined types were placed in this encounter.  Meds ordered this encounter  Medications  . gabapentin (NEURONTIN) 300 MG capsule    Sig: Take 1 capsule (300 mg total) by mouth 2 (two) times daily.    Dispense:  180 capsule    Refill:  1  . tiZANidine (ZANAFLEX) 4 MG tablet     Sig: Take one at bedtime prn    Dispense:  90 tablet    Refill:  1    Face-to-face time spent with patient was 30 minutes. 50% of time was spent in counseling and coordination of care.  Follow-Up Instructions: Return in about 6 months (around 05/26/2017) for [place], [place].   Eliezer Lofts, PA-C  Note - This record has been created using [employer].  Chart creation errors have been sought, but may not always  have been located. Such creation errors do not reflect on  the standard of medical care.

## 2016-12-01 ENCOUNTER — Ambulatory Visit (INDEPENDENT_AMBULATORY_CARE_PROVIDER_SITE_OTHER): Payer: BLUE CROSS/BLUE SHIELD

## 2016-12-01 ENCOUNTER — Ambulatory Visit (INDEPENDENT_AMBULATORY_CARE_PROVIDER_SITE_OTHER): Payer: Self-pay | Admitting: Podiatry

## 2016-12-01 DIAGNOSIS — M21629 Bunionette of unspecified foot: Secondary | ICD-10-CM

## 2016-12-01 DIAGNOSIS — M2041 Other hammer toe(s) (acquired), right foot: Secondary | ICD-10-CM | POA: Diagnosis not present

## 2016-12-01 DIAGNOSIS — M21621 Bunionette of right foot: Secondary | ICD-10-CM

## 2016-12-01 DIAGNOSIS — M2042 Other hammer toe(s) (acquired), left foot: Secondary | ICD-10-CM

## 2016-12-01 NOTE — Progress Notes (Signed)
She presents today for postop visit date of surgery 10/14/2016 fifth metatarsal osteotomy bilateral release second metatarsophalangeal joint of the left foot with hammertoe repair and K wire fixation. She states that she's doing well she states that she's been ambulating in her [place] shoe.  Objective: Vital signs are stable she is alert and oriented 3. Pulses are palpable. Minimal edema no erythema cellulitis drainage or odor. I removed the K wire from the second toe which had broke at the level of the metatarsophalangeal joint. Luckily it broke in the head of the metatarsal itself. It should cause no problems. She states that it felt much better to move her toe  Assessment: Well-healing surgical foot.  Plan: We'll follow up with her in about one month I will allow her to get back into regular shoe gear should she have questions or concerns she will notify us immediately.

## 2016-12-08 ENCOUNTER — Telehealth: Payer: Self-pay | Admitting: Obstetrics and Gynecology

## 2016-12-08 MED ORDER — FLUCONAZOLE 150 MG PO TABS: ORAL_TABLET | ORAL | 0 refills | Status: DC

## 2016-12-08 NOTE — Telephone Encounter (Signed)
Spoke with patient, advised as seen below per Dr. Quincy Simmonds. Patient verbalizes understanding and is agreeable.   Order placed for Diflucan as seen below per Dr. Quincy Simmonds.  Routing to provider for final review. Patient is agreeable to disposition. Will close encounter.

## 2016-12-08 NOTE — Telephone Encounter (Signed)
Patient has a yeast infection and want to know if something can be called in for her.

## 2016-12-08 NOTE — Telephone Encounter (Signed)
Ok for Diflucan 150 mg po x 1.  May repeat in 72 hours if needed.  Dispense:  2, RF none.  Office visit if symptoms do not resolve.

## 2016-12-08 NOTE — Telephone Encounter (Signed)
Spoke with patient. Patient states she has been on 2 antibiotics in the last 45 days and now feels she has a yeast infection. Patient states she has had vaginal itching, burning, white vaginal discharge and is uncomfortable. Patient denies any urinary complaints, fever, lower back pain or vaginal odor. Recommended OV for further evaluation, patient declined. Last OV 08/10/16. Patient states she was hoping Dr. Quincy Simmonds could call something in, she is currently taking care of her parents in [place] who have the flu. Advised patient would review with Dr. Quincy Simmonds and return call with any recommendations. Patient is agreeable.  Dr. Quincy Simmonds -please advise?

## 2016-12-22 ENCOUNTER — Ambulatory Visit (INDEPENDENT_AMBULATORY_CARE_PROVIDER_SITE_OTHER): Payer: Self-pay | Admitting: Podiatry

## 2016-12-22 ENCOUNTER — Encounter: Payer: Self-pay | Admitting: Podiatry

## 2016-12-22 ENCOUNTER — Ambulatory Visit (INDEPENDENT_AMBULATORY_CARE_PROVIDER_SITE_OTHER): Payer: BLUE CROSS/BLUE SHIELD

## 2016-12-22 DIAGNOSIS — M2042 Other hammer toe(s) (acquired), left foot: Secondary | ICD-10-CM

## 2016-12-22 DIAGNOSIS — M2041 Other hammer toe(s) (acquired), right foot: Secondary | ICD-10-CM

## 2016-12-22 DIAGNOSIS — M21629 Bunionette of unspecified foot: Secondary | ICD-10-CM

## 2016-12-22 NOTE — Progress Notes (Signed)
She presents today for postop visit date of surgery 10/14/2016. She is status post metatarsal osteotomy fifth bilateral with a release of the second metatarsophalangeal joint left hammertoe repair fifth right and second left. She also had an exostectomy first right which is going on to heal very nicely. She states that she is doing very well.  Objective: Vital signs are stable she is alert and oriented 3 she has no erythema or edema cellulitis drainage or odor incision sites appear to have gone heal uneventfully radiographs taken today demonstrate bilateral osteotomies to have healed with internal fixation in good position. She does have some stiffness on range of motion of these second metatarsophalangeal joint left foot. This appears to be scar tissue related.  Assessment: Well-healing surgical foot bilateral.  Plan: I encouraged range of motion exercises and I'll allow her to get back to her regular activities I will follow-up with her in 1 month for her final set of x-rays.

## 2016-12-23 DIAGNOSIS — Z0000 Encounter for general adult medical examination without abnormal findings: Secondary | ICD-10-CM | POA: Diagnosis not present

## 2016-12-30 DIAGNOSIS — K224 Dyskinesia of esophagus: Secondary | ICD-10-CM | POA: Diagnosis not present

## 2016-12-30 DIAGNOSIS — E7800 Pure hypercholesterolemia, unspecified: Secondary | ICD-10-CM | POA: Diagnosis not present

## 2016-12-30 DIAGNOSIS — K589 Irritable bowel syndrome without diarrhea: Secondary | ICD-10-CM | POA: Insufficient documentation

## 2016-12-30 DIAGNOSIS — F331 Major depressive disorder, recurrent, moderate: Secondary | ICD-10-CM | POA: Diagnosis not present

## 2016-12-30 DIAGNOSIS — I7300 Raynaud's syndrome without gangrene: Secondary | ICD-10-CM | POA: Diagnosis not present

## 2016-12-30 DIAGNOSIS — Z0000 Encounter for general adult medical examination without abnormal findings: Secondary | ICD-10-CM | POA: Diagnosis not present

## 2016-12-30 DIAGNOSIS — Z1389 Encounter for screening for other disorder: Secondary | ICD-10-CM | POA: Diagnosis not present

## 2017-01-19 ENCOUNTER — Ambulatory Visit (INDEPENDENT_AMBULATORY_CARE_PROVIDER_SITE_OTHER): Payer: BLUE CROSS/BLUE SHIELD

## 2017-01-19 ENCOUNTER — Ambulatory Visit (INDEPENDENT_AMBULATORY_CARE_PROVIDER_SITE_OTHER): Payer: Self-pay | Admitting: Podiatry

## 2017-01-19 DIAGNOSIS — Z9889 Other specified postprocedural states: Secondary | ICD-10-CM | POA: Diagnosis not present

## 2017-01-19 DIAGNOSIS — M21629 Bunionette of unspecified foot: Secondary | ICD-10-CM

## 2017-01-19 DIAGNOSIS — M2041 Other hammer toe(s) (acquired), right foot: Secondary | ICD-10-CM

## 2017-01-19 DIAGNOSIS — M2042 Other hammer toe(s) (acquired), left foot: Secondary | ICD-10-CM

## 2017-01-19 DIAGNOSIS — M21621 Bunionette of right foot: Secondary | ICD-10-CM

## 2017-01-19 NOTE — Progress Notes (Signed)
She presents today for postop visit date of surgery 10/14/2016 status post metatarsal osteotomy fifth bilateral. Capsulotomy. Metatarsophalangeal joint release second left. Hammertoe repair fifth bilateral and second left. Exostectomy first right. She states that she is doing very well.  Objective: Vital signs are stable alert and oriented 3. Pulses are palpable. No erythema or edema cellulitis drainage or odor all incision sites on heel uneventfully radiographs demonstrate completely healed wounds.  Assessment: Well-healed surgical foot bilateral.  Plan: Follow up with [place] on an as-needed basis D back to her regular routine.

## 2017-01-31 ENCOUNTER — Other Ambulatory Visit: Payer: Self-pay | Admitting: Cardiovascular Disease

## 2017-02-01 DIAGNOSIS — L308 Other specified dermatitis: Secondary | ICD-10-CM | POA: Diagnosis not present

## 2017-02-01 DIAGNOSIS — L739 Follicular disorder, unspecified: Secondary | ICD-10-CM | POA: Diagnosis not present

## 2017-02-01 DIAGNOSIS — L738 Other specified follicular disorders: Secondary | ICD-10-CM | POA: Diagnosis not present

## 2017-02-01 NOTE — Telephone Encounter (Signed)
Medication Detail    Disp Refills Start End   rosuvastatin (CRESTOR) 5 MG tablet 30 tablet 11 09/26/2016    Sig - Route: Take 1 tablet (5 mg total) by mouth daily at 6 PM. - Oral   E-Prescribing Status: Receipt confirmed by pharmacy (09/26/2016 2:09 PM EST)   Pharmacy   [id number] - [place], [place] - [place]

## 2017-03-09 NOTE — Progress Notes (Signed)
61 y.o. G90P1001 Married Caucasian female here for annual exam.    Now dx with IBS and receiving tx for that.   Having a lot of hot flashes off ERT.  Stopped one year ago.   Neurontin causes foot swelling so she limits how much she uses.  Having significant vaginal pain with intercourse for 4 - 6 weeks.  Feels like it is her "cervix."  (Had hysterectomy). Some dysuria.   Just took doxycycline for skin rash and finished this 2 weeks ago.   Using Zoloft 25 mg daily for depression.   PCP:   Dr. Osborne Casco - [facility]  Patient's last menstrual period was 11/07/1980.           Sexually active: Yes.    The current method of family planning is status post hysterectomy.    Exercising: Yes.    walking/weights/treadmill Smoker:  no  Health Maintenance: Pap: 03-06-15 Neg:Neg HR HPV History of abnormal Pap:  no MMG: 08-31-16 Density B/Neg/Birads1:TBC Colonoscopy: 08/2016, polyp removed --normal BMD:   2015  Result  Normal TDaP: UTD w/ PCP Gardasil:   no HIV: unsure if ever had Hep C: patient thinks she has had Hep C testing done in the past Screening Labs:  Hb today: PCP, Urine today: 1+ RBC, trace WBC   reports that she has never smoked. She has never used smokeless tobacco. She reports that she drinks about 1.8 - 2.4 oz of alcohol per week . She reports that she does not use drugs.  Past Medical History:  Diagnosis Date  . Asthma   . Atrophic vaginitis   . Blood transfusion without reported diagnosis 1975   with vaginal delivery--very anemic  . Cystic teratoma   . Elevated cholesterol   . Endometriosis   . Esophageal ulcer   . Fibromyalgia   . Fundic gland polyps of stomach, benign   . GERD (gastroesophageal reflux disease)   . History of diastolic dysfunction   . History of migraine headaches   . Leg edema    left leg  . Ocular rosacea   . Osteoarthritis    back,hands, knees  . Overactive bladder   . Raynaud's disease   . Tachycardia   . Tubular  adenoma of colon 08/2016    Past Surgical History:  Procedure Laterality Date  . APPENDECTOMY    . BACK SURGERY    . BREAST SURGERY  10-09-13   breast reduction--Dr. Dessie Coma  . BUNIONECTOMY     bilateral  . CARDIOVASCULAR STRESS TEST  07/02/2008   EF 55-60%  . colonscopy    . DENTAL SURGERY  2007  . ESOPHAGOGASTRODUODENOSCOPY ENDOSCOPY    . FOOT SURGERY     LEFT FOOT  . left breast mass removed    . OOPHORECTOMY  1982   RSO partial and LSO, uterus  . RSO  2002   partial  . ruptured disc surgery  07,08  . TOTAL ABDOMINAL HYSTERECTOMY      Current Outpatient Prescriptions  Medication Sig Dispense Refill  . ARNUITY ELLIPTA 200 MCG/ACT AEPB Inhale 1 puff into the lungs daily.  5  . cetirizine (ZYRTEC) 10 MG tablet Take 10 mg by mouth daily.    . cycloSPORINE (RESTASIS) 0.05 % ophthalmic emulsion Place 1 drop into both eyes 2 (two) times daily.    Marland Kitchen dicyclomine (BENTYL) 10 MG capsule Take 1 capsule (10 mg total) by mouth 4 (four) times daily -  before meals and at bedtime. 120 capsule 5  .  diltiazem (CARDIZEM CD) 120 MG 24 hr capsule TAKE ONE CAPSULE BY MOUTH EVERY DAY 90 capsule 3  . gabapentin (NEURONTIN) 300 MG capsule Take 1 capsule (300 mg total) by mouth 2 (two) times daily. 180 capsule 1  . montelukast (SINGULAIR) 10 MG tablet Take 10 mg by mouth at bedtime.      . Omega-3 Fatty Acids (OMEGA 3 PO) Take 1 capsule by mouth daily.      Marland Kitchen PROAIR HFA 108 (90 BASE) MCG/ACT inhaler Inhale 2 puffs into the lungs every 6 (six) hours as needed for wheezing or shortness of breath.   11  . rosuvastatin (CRESTOR) 5 MG tablet Take 1 tablet (5 mg total) by mouth daily at 6 PM. 30 tablet 11  . sertraline (ZOLOFT) 50 MG tablet 1/2 TABLET ONCE DAILY  2  . tiZANidine (ZANAFLEX) 4 MG tablet Take one at bedtime prn 90 tablet 1   Current Facility-Administered Medications  Medication Dose Route Frequency Provider Last Rate Last Dose  . 0.9 %  sodium chloride infusion  500 mL Intravenous  Continuous Ladene Artist, MD        Family History  Problem Relation Age of Onset  . Coronary artery disease Father   . Hypertension Father   . Heart disease Father   . Heart attack Father   . Asthma Mother   . Hypertension Mother   . Alzheimer's disease Maternal Grandmother   . Lung cancer Maternal Grandfather   . Heart attack Maternal Grandfather   . Heart failure Paternal Grandfather   . Heart attack Paternal Grandfather   . Skin cancer Sister     melanoma  . Hepatitis C Brother   . Skin cancer Brother     melanoma  . Pancreatic cancer Maternal Aunt   . Colon cancer Neg Hx   . Colon polyps Neg Hx   . Rectal cancer Neg Hx   . Stomach cancer Neg Hx     ROS:  Pertinent items are noted in HPI.  Otherwise, a comprehensive ROS was negative.  Exam:   BP 110/66 (BP Location: Right Arm, Patient Position: Sitting, Cuff Size: Normal)   Pulse 84   Resp 16   Ht 5' 5.25" (1.657 m)   Wt 139 lb (63 kg)   LMP 11/07/1980   BMI 22.95 kg/m     General appearance: alert, cooperative and appears stated age Head: Normocephalic, without obvious abnormality, atraumatic Neck: no adenopathy, supple, symmetrical, trachea midline and thyroid normal to inspection and palpation Lungs: clear to auscultation bilaterally Breasts: normal appearance, no masses or tenderness, No nipple retraction or dimpling, No nipple discharge or bleeding, No axillary or supraclavicular adenopathy Heart: regular rate and rhythm Abdomen: soft, non-tender; no masses, no organomegaly Extremities: extremities normal, atraumatic, no cyanosis or edema Skin: Skin color, texture, turgor normal. No rashes or lesions Lymph nodes: Cervical, supraclavicular, and axillary nodes normal. No abnormal inguinal nodes palpated Neurologic: Grossly normal  Pelvic: External genitalia:  no lesions              Urethra:  normal appearing urethra with no masses, tenderness or lesions              Bartholins and Skenes: normal                  Vagina: normal appearing vagina with normal color and discharge, no lesions.  Atrophy noted.              Cervix:  Absent.  Pap taken: No. Bimanual Exam:  Uterus:  Absent.               Adnexa: no mass, fullness, tenderness              Rectal exam: Yes.  .  Confirms.              Anus:  normal sphincter tone, no lesions  Chaperone was present for exam.  Assessment:   Well woman visit with normal exam. Status post TAH.  Status post partial RSO and status post LSO. Hx recurrent vaginitis.  Somewhat improved since stopping ERT and Estring.  Menopausal symptoms.  On Zoloft and Neurontin.  IBS.  Plan: Mammogram screening discussed. Recommended self breast awareness. Pap and HR HPV as above. Guidelines for Calcium, Vitamin D, regular exercise program including cardiovascular and weight bearing exercise. Affirm.  Urine micro and culture.  She will try cooking oils for vaginal hydration.  I discussed Josph Macho Touch as well. We talked about treatment for menopausal symptoms by increasing Neurontin, increasing Zoloft or switching to Effexor.   She will discuss the Effexor with her PCP.  Labs with PCP.  Follow up annually and prn.   After visit summary provided.

## 2017-03-10 ENCOUNTER — Encounter: Payer: Self-pay | Admitting: Obstetrics and Gynecology

## 2017-03-10 ENCOUNTER — Ambulatory Visit (INDEPENDENT_AMBULATORY_CARE_PROVIDER_SITE_OTHER): Payer: BLUE CROSS/BLUE SHIELD | Admitting: Obstetrics and Gynecology

## 2017-03-10 VITALS — BP 110/66 | HR 84 | Resp 16 | Ht 65.25 in | Wt 139.0 lb

## 2017-03-10 DIAGNOSIS — Z01419 Encounter for gynecological examination (general) (routine) without abnormal findings: Secondary | ICD-10-CM

## 2017-03-10 DIAGNOSIS — N941 Unspecified dyspareunia: Secondary | ICD-10-CM

## 2017-03-10 DIAGNOSIS — R300 Dysuria: Secondary | ICD-10-CM | POA: Diagnosis not present

## 2017-03-10 LAB — POCT URINALYSIS DIPSTICK
BILIRUBIN UA: NEGATIVE
Blood, UA: 1
Glucose, UA: NEGATIVE
KETONES UA: NEGATIVE
Nitrite, UA: NEGATIVE
PH UA: 5 (ref 5.0–8.0)
Protein, UA: NEGATIVE
Urobilinogen, UA: 0.2 E.U./dL

## 2017-03-10 MED ORDER — SULFAMETHOXAZOLE-TRIMETHOPRIM 800-160 MG PO TABS: 1.0000 | ORAL_TABLET | Freq: Two times a day (BID) | ORAL | 0 refills | Status: DC

## 2017-03-10 NOTE — Patient Instructions (Signed)
EXERCISE AND DIET:  We recommended that you start or continue a regular exercise program for good health. Regular exercise means any activity that makes your heart beat faster and makes you sweat.  We recommend exercising at least 30 minutes per day at least 3 days a week, preferably 4 or 5.  We also recommend a diet low in fat and sugar.  Inactivity, poor dietary choices and obesity can cause diabetes, heart attack, stroke, and kidney damage, among others.    ALCOHOL AND SMOKING:  Women should limit their alcohol intake to no more than 7 drinks/beers/glasses of wine (combined, not each!) per week. Moderation of alcohol intake to this level decreases your risk of breast cancer and liver damage. And of course, no recreational drugs are part of a healthy lifestyle.  And absolutely no smoking or even second hand smoke. Most people know smoking can cause heart and lung diseases, but did you know it also contributes to weakening of your bones? Aging of your skin?  Yellowing of your teeth and nails?  CALCIUM AND VITAMIN D:  Adequate intake of calcium and Vitamin D are recommended.  The recommendations for exact amounts of these supplements seem to change often, but generally speaking 600 mg of calcium (either carbonate or citrate) and 800 units of Vitamin D per day seems prudent. Certain women may benefit from higher intake of Vitamin D.  If you are among these women, your doctor will have told you during your visit.    PAP SMEARS:  Pap smears, to check for cervical cancer or precancers,  have traditionally been done yearly, although recent scientific advances have shown that most women can have pap smears less often.  However, every woman still should have a physical exam from her gynecologist every year. It will include a breast check, inspection of the vulva and vagina to check for abnormal growths or skin changes, a visual exam of the cervix, and then an exam to evaluate the size and shape of the uterus and  ovaries.  And after 61 years of age, a rectal exam is indicated to check for rectal cancers. We will also provide age appropriate advice regarding health maintenance, like when you should have certain vaccines, screening for sexually transmitted diseases, bone density testing, colonoscopy, mammograms, etc.   MAMMOGRAMS:  All women over 40 years old should have a yearly mammogram. Many facilities now offer a "3D" mammogram, which may cost around $50 extra out of pocket. If possible,  we recommend you accept the option to have the 3D mammogram performed.  It both reduces the number of women who will be called back for extra views which then turn out to be normal, and it is better than the routine mammogram at detecting truly abnormal areas.    COLONOSCOPY:  Colonoscopy to screen for colon cancer is recommended for all women at age 50.  We know, you hate the idea of the prep.  We agree, BUT, having colon cancer and not knowing it is worse!!  Colon cancer so often starts as a polyp that can be seen and removed at colonscopy, which can quite literally save your life!  And if your first colonoscopy is normal and you have no family history of colon cancer, most women don't have to have it again for 10 years.  Once every ten years, you can do something that may end up saving your life, right?  We will be happy to help you get it scheduled when you are ready.    Be sure to check your insurance coverage so you understand how much it will cost.  It may be covered as a preventative service at no cost, but you should check your particular policy.     Venlafaxine tablets What is this medicine? VENLAFAXINE (VEN la fax een) is used to treat depression, anxiety and panic disorder. This medicine may be used for other purposes; ask your health care provider or pharmacist if you have questions. COMMON BRAND NAME(S): Effexor What should I tell my health care provider before I take this medicine? They need to know if you have  any of these conditions: -bleeding disorders -glaucoma -heart disease -high blood pressure -high cholesterol -kidney disease -liver disease -low levels of sodium in the blood -mania or bipolar disorder -seizures -suicidal thoughts, plans, or attempt; a previous suicide attempt by you or a family -take medicines that treat or prevent blood clots -thyroid disease -an unusual or allergic reaction to venlafaxine, desvenlafaxine, other medicines, foods, dyes, or preservatives -pregnant or trying to get pregnant -breast-feeding How should I use this medicine? Take this medicine by mouth with a glass of water. Follow the directions on the prescription label. Take it with food. Take your medicine at regular intervals. Do not take your medicine more often than directed. Do not stop taking this medicine suddenly except upon the advice of your doctor. Stopping this medicine too quickly may cause serious side effects or your condition may worsen. A special MedGuide will be given to you by the pharmacist with each prescription and refill. Be sure to read this information carefully each time. Talk to your pediatrician regarding the use of this medicine in children. Special care may be needed. Overdosage: If you think you have taken too much of this medicine contact a poison control center or emergency room at once. NOTE: This medicine is only for you. Do not share this medicine with others. What if I miss a dose? If you miss a dose, take it as soon as you can. If it is almost time for your next dose, take only that dose. Do not take double or extra doses. What may interact with this medicine? Do not take this medicine with any of the following medications: -certain medicines for fungal infections like fluconazole, itraconazole, ketoconazole, posaconazole, voriconazole -cisapride -desvenlafaxine -dofetilide -dronedarone -duloxetine -levomilnacipran -linezolid -MAOIs like Carbex, Eldepryl,  Marplan, Nardil, and Parnate -methylene blue (injected into a vein) -milnacipran -pimozide -thioridazine -ziprasidone This medicine may also interact with the following medications: -amphetamines -aspirin and aspirin-like medicines -certain medicines for depression, anxiety, or psychotic disturbances -certain medicines for migraine headaches like almotriptan, eletriptan, frovatriptan, naratriptan, rizatriptan, sumatriptan, zolmitriptan -certain medicines for sleep -certain medicines that treat or prevent blood clots like dalteparin, enoxaparin, warfarin -cimetidine -clozapine -diuretics -fentanyl -furazolidone -indinavir -isoniazid -lithium -metoprolol -NSAIDS, medicines for pain and inflammation, like ibuprofen or naproxen -other medicines that prolong the QT interval (cause an abnormal heart rhythm) -procarbazine -rasagiline -supplements like St. John's wort, kava kava, valerian -tramadol -tryptophan This list may not describe all possible interactions. Give your health care provider a list of all the medicines, herbs, non-prescription drugs, or dietary supplements you use. Also tell them if you smoke, drink alcohol, or use illegal drugs. Some items may interact with your medicine. What should I watch for while using this medicine? Tell your doctor if your symptoms do not get better or if they get worse. Visit your doctor or health care professional for regular checks on your progress. Because it may take several  weeks to see the full effects of this medicine, it is important to continue your treatment as prescribed by your doctor. Patients and their families should watch out for new or worsening thoughts of suicide or depression. Also watch out for sudden changes in feelings such as feeling anxious, agitated, panicky, irritable, hostile, aggressive, impulsive, severely restless, overly excited and hyperactive, or not being able to sleep. If this happens, especially at the beginning  of treatment or after a change in dose, call your health care professional. This medicine can cause an increase in blood pressure. Check with your doctor for instructions on monitoring your blood pressure while taking this medicine. You may get drowsy or dizzy. Do not drive, use machinery, or do anything that needs mental alertness until you know how this medicine affects you. Do not stand or sit up quickly, especially if you are an older patient. This reduces the risk of dizzy or fainting spells. Alcohol may interfere with the effect of this medicine. Avoid alcoholic drinks. Your mouth may get dry. Chewing sugarless gum, sucking hard candy and drinking plenty of water will help. Contact your doctor if the problem does not go away or is severe. What side effects may I notice from receiving this medicine? Side effects that you should report to your doctor or health care professional as soon as possible: -allergic reactions like skin rash, itching or hives, swelling of the face, lips, or tongue -anxious -breathing problems -confusion -changes in vision -chest pain -confusion -elevated mood, decreased need for sleep, racing thoughts, impulsive behavior -eye pain -fast, irregular heartbeat -feeling faint or lightheaded, falls -feeling agitated, angry, or irritable -hallucination, loss of contact with reality -high blood pressure -loss of balance or coordination -palpitations -redness, blistering, peeling or loosening of the skin, including inside the mouth -restlessness, pacing, inability to keep still -seizures -stiff muscles -suicidal thoughts or other mood changes -trouble passing urine or change in the amount of urine -trouble sleeping -unusual bleeding or bruising -unusually weak or tired -vomiting Side effects that usually do not require medical attention (report to your doctor or health care professional if they continue or are bothersome): -change in sex drive or  performance -change in appetite or weight -constipation -dizziness -dry mouth -headache -increased sweating -nausea -tired This list may not describe all possible side effects. Call your doctor for medical advice about side effects. You may report side effects to FDA at 1-800-FDA-1088. Where should I keep my medicine? Keep out of the reach of children. Store at a controlled temperature between 20 and 25 degrees C (68 and 77 degrees F), in a dry place. Throw away any unused medicine after the expiration date. NOTE: This sheet is a summary. It may not cover all possible information. If you have questions about this medicine, talk to your doctor, pharmacist, or health care provider.  2018 [employer]vier/Gold Standard (2016-03-24 18:42:26)

## 2017-03-11 LAB — WET PREP BY MOLECULAR PROBE
CANDIDA SPECIES: NOT DETECTED
Gardnerella vaginalis: NOT DETECTED
TRICHOMONAS VAG: NOT DETECTED

## 2017-03-11 LAB — URINALYSIS, MICROSCOPIC ONLY
Bacteria, UA: NONE SEEN [HPF]
CASTS: NONE SEEN [LPF]
CRYSTALS: NONE SEEN [HPF]
Squamous Epithelial / LPF: NONE SEEN [HPF] (ref <=5)
WBC, UA: NONE SEEN WBC/HPF (ref <=5)
YEAST: NONE SEEN [HPF]

## 2017-03-13 ENCOUNTER — Telehealth: Payer: Self-pay

## 2017-03-13 LAB — URINE CULTURE

## 2017-03-13 MED ORDER — AMOXICILLIN 500 MG PO CAPS: 500.0000 mg | ORAL_CAPSULE | Freq: Three times a day (TID) | ORAL | 0 refills | Status: DC

## 2017-03-13 MED ORDER — FLUCONAZOLE 150 MG PO TABS: 150.0000 mg | ORAL_TABLET | Freq: Once | ORAL | 0 refills | Status: AC

## 2017-03-13 NOTE — Telephone Encounter (Signed)
-----   Message from Nunzio Cobbs, MD sent at 03/13/2017  4:35 PM EDT ----- Please contact patient with results of the UC which show Enterococcus bacteria.  The Bactrim does not usually treat this. I am waiting for the final sensitivities but am recommending a switch to Ampicillin 500 mg po q 6 hours x 7 days.  Her Affirm testing is negative for vaginitis.  The Ampicillin can cause yeast infection, so I can also prescribe Diflucan 150 mg po x 1 prn itching and burning following the Ampicillin.  May repeat in 72 hours if needed. Dispense:  2, RF none.

## 2017-03-13 NOTE — Telephone Encounter (Signed)
Spoke with patient and notified of urine culture and affirm results. Advised patient to stop Bactrim and will send in RX for Ampicillin 500mg  #21, 1 po q 6hours and will send in RX for Diflucan 150mg  #2, 1 prn itching/burning and can repeat in 72 hours if needed--Zero refills--[facility]

## 2017-03-17 ENCOUNTER — Other Ambulatory Visit: Payer: Self-pay | Admitting: Gastroenterology

## 2017-05-19 DIAGNOSIS — J4540 Moderate persistent asthma, uncomplicated: Secondary | ICD-10-CM | POA: Diagnosis not present

## 2017-05-19 DIAGNOSIS — J3089 Other allergic rhinitis: Secondary | ICD-10-CM | POA: Diagnosis not present

## 2017-06-01 ENCOUNTER — Ambulatory Visit (INDEPENDENT_AMBULATORY_CARE_PROVIDER_SITE_OTHER): Payer: BLUE CROSS/BLUE SHIELD | Admitting: Rheumatology

## 2017-06-01 ENCOUNTER — Encounter: Payer: Self-pay | Admitting: Rheumatology

## 2017-06-01 VITALS — BP 98/58 | HR 73 | Resp 14 | Ht 65.5 in | Wt 139.0 lb

## 2017-06-01 DIAGNOSIS — R5382 Chronic fatigue, unspecified: Secondary | ICD-10-CM | POA: Diagnosis not present

## 2017-06-01 DIAGNOSIS — M797 Fibromyalgia: Secondary | ICD-10-CM

## 2017-06-01 DIAGNOSIS — F5101 Primary insomnia: Secondary | ICD-10-CM

## 2017-06-01 DIAGNOSIS — I7300 Raynaud's syndrome without gangrene: Secondary | ICD-10-CM

## 2017-06-01 MED ORDER — GABAPENTIN 300 MG PO CAPS: 300.0000 mg | ORAL_CAPSULE | Freq: Two times a day (BID) | ORAL | 1 refills | Status: DC

## 2017-06-01 MED ORDER — TIZANIDINE HCL 4 MG PO TABS: ORAL_TABLET | ORAL | 1 refills | Status: DC

## 2017-06-01 NOTE — Progress Notes (Signed)
Office Visit Note  Patient: Lauren Montes             Date of Birth: 03-Dec-1955           MRN: [id number]             PCP: Haywood Pao, MD Referring: Haywood Pao, MD Visit Date: 06/01/2017 Occupation: @GUAROCC @    Subjective:  Osteoarthritis (Raynauds, doing well)  History of Present Illness: Lauren Montes is a 61 y.o. female  Who was last seen in our office on 11/26/2016 for fibromyalgia and Raynaud's. On that visit, her fibromyalgia was doing well and she rated her discomfort as 2 on a scale of 0-10. She also was on Zoloft which I feel may have been helping her fibromyalgia. Patient has history of Raynaud's which flared when in [place] for about a week. She was not taking enteric-coated aspirin 81 mg daily and I advised her to start taking it and she is agreeable.  A, patient reports that her fibromyalgia discomfort is still a 2 on a scale of 0-10. She is well pleased with the combination of medication she's taking for fibromyalgia. As reported earlier, I think the Zoloft is working well for the patient is giving her some fibromyalgia relief. The zanalfex is also working well and giving the patient very restful sleep. Today is working well for the patient to.  I'm refilling patient's gabapentin at a 90 day supply with a refill today I'm refilling the patient's ties [place] to 90 day supply with a refill. Patient had labs done at her PCPs office at [facility] back in February 2018. These documents are scant. CBC with differential and CMP with GFR were normal. Lipid panel is also normal. No change in treatment at this time. Patient's trapezius muscles are also not problematic at this time and she does not have any pain. Patient uses Voltaren gel judiciously and does not need any refills at this time.  Activities of Daily Living:  Patient reports morning stiffness for 15 minutes.   Patient Denies nocturnal pain.  Difficulty dressing/grooming:  Denies Difficulty climbing stairs: Denies Difficulty getting out of chair: Denies Difficulty using hands for taps, buttons, cutlery, and/or writing: Denies   Review of Systems  Constitutional: Positive for fatigue.  HENT: Negative for mouth sores and mouth dryness.   Eyes: Negative for dryness.  Respiratory: Negative for shortness of breath.   Gastrointestinal: Negative for constipation and diarrhea.  Musculoskeletal: Positive for myalgias and myalgias.  Skin: Negative for sensitivity to sunlight.  Psychiatric/Behavioral: Positive for sleep disturbance. Negative for decreased concentration.    PMFS History:  Patient Active Problem List   Diagnosis Date Noted  . SVT (supraventricular tachycardia) ([place]) 09/26/2016  . HTN (hypertension) 09/24/2015  . Palpitations 03/05/2015  . Fibromyalgia 03/05/2015  . Chronic diastolic CHF (congestive heart failure), NYHA class 2 ([place]) 06/20/2014  . Sinus tachycardia 09/14/2012  . Dysphagia 06/29/2012  . Ocular rosacea   . GERD (gastroesophageal reflux disease)   . Cough 09/09/2011  . Chest tightness, discomfort, or pressure 09/09/2011  . Hyperlipidemia 05/10/2011  . SWELLING OF LIMB 01/20/2011  . GERD 02/03/2010  . ABDOMINAL PAIN-EPIGASTRIC 02/03/2010  . ABDOMINAL PAIN-MULTIPLE SITES 02/03/2010  . CONSTIPATION 09/19/2008  . ABDOMINAL PAIN, LEFT LOWER QUADRANT 09/19/2008  . COLONIC POLYPS, HYPERPLASTIC, HX OF 09/19/2008    Past Medical History:  Diagnosis Date  . Asthma   . Atrophic vaginitis   . Blood transfusion without reported diagnosis 1975  with vaginal delivery--very anemic  . Cystic teratoma   . Elevated cholesterol   . Endometriosis   . Esophageal ulcer   . Fibromyalgia   . Fundic gland polyps of stomach, benign   . GERD (gastroesophageal reflux disease)   . History of diastolic dysfunction   . History of migraine headaches   . Leg edema    left leg  . Ocular rosacea   . Osteoarthritis    back,hands, knees  .  Overactive bladder   . Raynaud's disease   . Tachycardia   . Tubular adenoma of colon 08/2016    Family History  Problem Relation Age of Onset  . Coronary artery disease Father   . Hypertension Father   . Heart disease Father   . Heart attack Father   . Asthma Mother   . Hypertension Mother   . Alzheimer's disease Maternal Grandmother   . Lung cancer Maternal Grandfather   . Heart attack Maternal Grandfather   . Heart failure Paternal Grandfather   . Heart attack Paternal Grandfather   . Skin cancer Sister        melanoma  . Hepatitis C Brother   . Skin cancer Brother        melanoma  . Pancreatic cancer Maternal Aunt   . Colon cancer Neg Hx   . Colon polyps Neg Hx   . Rectal cancer Neg Hx   . Stomach cancer Neg Hx    Past Surgical History:  Procedure Laterality Date  . APPENDECTOMY    . BACK SURGERY    . BREAST SURGERY  10-09-13   breast reduction--Dr. Dessie Coma  . BUNIONECTOMY     bilateral  . CARDIOVASCULAR STRESS TEST  07/02/2008   EF 55-60%  . colonscopy    . DENTAL SURGERY  2007  . ESOPHAGOGASTRODUODENOSCOPY ENDOSCOPY    . FOOT SURGERY     LEFT FOOT  . left breast mass removed    . OOPHORECTOMY  1982   RSO partial and LSO, uterus  . RSO  2002   partial  . ruptured disc surgery  07,08  . TOTAL ABDOMINAL HYSTERECTOMY     Social History   Social History Narrative  . No narrative on file     Objective: Vital Signs: BP (!) 98/58 (BP Location: Left Arm, Patient Position: Sitting, Cuff Size: Normal)   Pulse 73   Resp 14   Ht 5' 5.5" (1.664 m)   Wt 139 lb (63 kg)   LMP 11/07/1980   BMI 22.78 kg/m    Physical Exam  Constitutional: She is oriented to person, place, and time. She appears well-developed and well-nourished.  HENT:  Head: Normocephalic and atraumatic.  Eyes: Pupils are equal, round, and reactive to light. EOM are normal.  Cardiovascular: Normal rate, regular rhythm and normal heart sounds.  Exam reveals no gallop and no friction rub.     No murmur heard. Pulmonary/Chest: Effort normal and breath sounds normal. She has no wheezes. She has no rales.  Abdominal: Soft. Bowel sounds are normal. She exhibits no distension. There is no tenderness. There is no guarding. No hernia.  Musculoskeletal: Normal range of motion. She exhibits no edema, tenderness or deformity.  Lymphadenopathy:    She has no cervical adenopathy.  Neurological: She is alert and oriented to person, place, and time. Coordination normal.  Skin: Skin is warm and dry. Capillary refill takes less than 2 seconds. No rash noted.  Psychiatric: She has a normal mood and affect. Her behavior  is normal.  Nursing note and vitals reviewed.    Musculoskeletal Exam:  Full range of motion of all joints Grip strength is equal and strong bilaterally Fibromyalgia points are 2 out of 18 positive (bilateral trapezius muscles).  CDAI Exam: CDAI Homunculus Exam:   Joint Counts:  CDAI Tender Joint count: 0 CDAI Swollen Joint count: 0  Global Assessments:  Patient Global Assessment: 2 Provider Global Assessment: 2  CDAI Calculated Score: 4    Investigation: No additional findings.  Labs from [facility] have been scanned into chart. February 2018: CBC with differential, CMP with GFR, cholesterol panel are all within normal limits  Office Visit on 03/10/2017  Component Date Value Ref Range Status  . Color, UA 03/10/2017 yellow   Final  . Clarity, UA 03/10/2017 clear   Final  . Glucose, UA 03/10/2017 n   Final  . Bilirubin, UA 03/10/2017 n   Final  . Ketones, UA 03/10/2017 n   Final  . Blood, UA 03/10/2017 1   Final  . pH, UA 03/10/2017 5.0  5.0 - 8.0 Final  . Protein, UA 03/10/2017 n   Final  . Urobilinogen, UA 03/10/2017 0.2  0.2 or 1.0 E.U./dL Final  . Nitrite, UA 03/10/2017 n   Final  . Leukocytes, UA 03/10/2017 Trace* Negative Final  . Candida species 03/10/2017 NOT DETECTED  NOT DETECTED Final  . Trichomonas vaginosis 03/10/2017 NOT DETECTED  NOT DETECTED  Final  . Gardnerella vaginalis 03/10/2017 NOT DETECTED  NOT DETECTED Final  . Culture 03/10/2017 ENTEROCOCCUS SPECIES   Final   SOURCE: URINE&URINE  . Colony Count 03/10/2017 50,000-100,000 CFU/mL   Final  . Organism ID, Bacteria 03/10/2017 ENTEROCOCCUS SPECIES   Final  . WBC, UA 03/10/2017 NONE SEEN  <=5 WBC/HPF Final  . RBC / HPF 03/10/2017 10-20* <=2 RBC/HPF Final  . Squamous Epithelial / LPF 03/10/2017 NONE SEEN  <=5 HPF Final  . Bacteria, UA 03/10/2017 NONE SEEN  NONE SEEN HPF Final  . Crystals 03/10/2017 NONE SEEN  NONE SEEN HPF Final  . Casts 03/10/2017 NONE SEEN  NONE SEEN LPF Final  . Yeast 03/10/2017 NONE SEEN  NONE SEEN HPF Final     Imaging: No results found.  Speciality Comments: No specialty comments available.    Procedures:  No procedures performed Allergies: Codeine; Lipitor [atorvastatin]; and Latex   Assessment / Plan:     Visit Diagnoses: Fibromyalgia  Chronic fatigue  Raynaud's disease without gangrene  Primary insomnia   Plan: #1: Fibromyalgia syndrome. Active disease with generalized pain and no tender points today. Patient uses Zoloft for depression which is helping her fibromyalgia some. She is also using gabapentin for pain. Works very well for her.  #2: Fatigue and insomnia. Insomnia is well controlled with Zanaflex. Although she takes it every night and has good results, she will try taking on a when necessary basis  #3: Return to clinic in 6 months  #4: Raynaud's. No flare. ecasa 81mg  qd in winter advised.     Orders: No orders of the defined types were placed in this encounter.  Meds ordered this encounter  Medications  . gabapentin (NEURONTIN) 300 MG capsule    Sig: Take 1 capsule (300 mg total) by mouth 2 (two) times daily.    Dispense:  180 capsule    Refill:  1    Order Specific Question:   Supervising Provider    Answer:   Bo Merino [[id number]]  . tiZANidine (ZANAFLEX) 4 MG tablet    Sig: Take  one at bedtime prn     Dispense:  90 tablet    Refill:  1    Order Specific Question:   Supervising Provider    Answer:   Bo Merino [[id number]]    Face-to-face time spent with patient was 30 minutes. Greater than 50% of time was spent in counseling and coordination of care.  Follow-Up Instructions: Return in about 6 months (around 12/02/2017) for [place], [place].   Eliezer Lofts, PA-C  Note - This record has been created using [employer].  Chart creation errors have been sought, but may not always  have been located. Such creation errors do not reflect on  the standard of medical care.

## 2017-06-02 DIAGNOSIS — H25813 Combined forms of age-related cataract, bilateral: Secondary | ICD-10-CM | POA: Diagnosis not present

## 2017-06-02 DIAGNOSIS — H04123 Dry eye syndrome of bilateral lacrimal glands: Secondary | ICD-10-CM | POA: Diagnosis not present

## 2017-06-02 DIAGNOSIS — H16203 Unspecified keratoconjunctivitis, bilateral: Secondary | ICD-10-CM | POA: Diagnosis not present

## 2017-06-02 DIAGNOSIS — H524 Presbyopia: Secondary | ICD-10-CM | POA: Diagnosis not present

## 2017-07-07 DIAGNOSIS — L821 Other seborrheic keratosis: Secondary | ICD-10-CM | POA: Diagnosis not present

## 2017-07-07 DIAGNOSIS — Z79890 Hormone replacement therapy (postmenopausal): Secondary | ICD-10-CM | POA: Diagnosis not present

## 2017-07-07 DIAGNOSIS — L814 Other melanin hyperpigmentation: Secondary | ICD-10-CM | POA: Diagnosis not present

## 2017-07-07 DIAGNOSIS — J4540 Moderate persistent asthma, uncomplicated: Secondary | ICD-10-CM | POA: Diagnosis not present

## 2017-07-07 DIAGNOSIS — Z23 Encounter for immunization: Secondary | ICD-10-CM | POA: Diagnosis not present

## 2017-07-07 DIAGNOSIS — G43909 Migraine, unspecified, not intractable, without status migrainosus: Secondary | ICD-10-CM | POA: Diagnosis not present

## 2017-07-07 DIAGNOSIS — D2271 Melanocytic nevi of right lower limb, including hip: Secondary | ICD-10-CM | POA: Diagnosis not present

## 2017-07-07 DIAGNOSIS — M47819 Spondylosis without myelopathy or radiculopathy, site unspecified: Secondary | ICD-10-CM | POA: Diagnosis not present

## 2017-07-07 DIAGNOSIS — D2261 Melanocytic nevi of right upper limb, including shoulder: Secondary | ICD-10-CM | POA: Diagnosis not present

## 2017-07-07 DIAGNOSIS — E7800 Pure hypercholesterolemia, unspecified: Secondary | ICD-10-CM | POA: Diagnosis not present

## 2017-08-07 ENCOUNTER — Other Ambulatory Visit: Payer: Self-pay | Admitting: Obstetrics and Gynecology

## 2017-08-07 DIAGNOSIS — Z1231 Encounter for screening mammogram for malignant neoplasm of breast: Secondary | ICD-10-CM

## 2017-09-05 ENCOUNTER — Ambulatory Visit
Admission: RE | Admit: 2017-09-05 | Discharge: 2017-09-05 | Disposition: A | Discharge status: Home / Self Care | Payer: BLUE CROSS/BLUE SHIELD | Source: Ambulatory Visit | Attending: Obstetrics and Gynecology | Admitting: Obstetrics and Gynecology

## 2017-09-05 DIAGNOSIS — Z1231 Encounter for screening mammogram for malignant neoplasm of breast: Secondary | ICD-10-CM | POA: Diagnosis not present

## 2017-09-26 ENCOUNTER — Ambulatory Visit (INDEPENDENT_AMBULATORY_CARE_PROVIDER_SITE_OTHER): Payer: BLUE CROSS/BLUE SHIELD | Admitting: Cardiovascular Disease

## 2017-09-26 ENCOUNTER — Encounter: Payer: Self-pay | Admitting: Cardiovascular Disease

## 2017-09-26 VITALS — BP 120/72 | HR 78 | Ht 65.5 in | Wt 138.1 lb

## 2017-09-26 DIAGNOSIS — R000 Tachycardia, unspecified: Secondary | ICD-10-CM

## 2017-09-26 DIAGNOSIS — E782 Mixed hyperlipidemia: Secondary | ICD-10-CM

## 2017-09-26 DIAGNOSIS — I471 Supraventricular tachycardia: Secondary | ICD-10-CM | POA: Diagnosis not present

## 2017-09-26 MED ORDER — DILTIAZEM HCL ER COATED BEADS 120 MG PO CP24: 120.0000 mg | ORAL_CAPSULE | Freq: Every day | ORAL | 3 refills | Status: DC

## 2017-09-26 NOTE — Patient Instructions (Signed)
Medication Instructions:  Your physician recommends that you continue on your current medications as directed. Please refer to the Current Medication list given to you today.   Labwork: TODAY - cholesterol, basic metabolic panel, liver panel   Testing/Procedures: None Ordered   Follow-Up: Your physician wants you to follow-up in: 1 year with Dr. Acie Fredrickson.  You will receive a reminder letter in the mail two months in advance. If you don't receive a letter, please call our office to schedule the follow-up appointment.   If you need a refill on your cardiac medications before your next appointment, please call your pharmacy.   Thank you for choosing [facility]! Christen Bame, RN [phone]

## 2017-09-26 NOTE — Progress Notes (Signed)
Lauren Montes Date of Birth  June 07, 1956 [facility] [address] .[place], [place]  [address] [phone]  Fax  [phone]  1. Hyperlipidemia 2. Sinus tachycardia, SVT 3. Chronic asthma     Lauren Montes is a middle-age female the history of mild diastolic dysfunction as well as leg edema. She also has a history of hypercholesterolemia.   Has a history of hyperlipidemia. We have had her on Crestor the past which she tolerated very well. Her insurance and he refused to pay for to try her on atorvastatin. This caused her to have lots of muscle aches as well as memory loss. We discontinued the atorvastatin at that time.  She has been recording her heart rate and blood pressure readings. Her heart rate is frequently above 100. He also has many readings in the 80-90 range.  Has bruising particularly on her hands and on her fingers. [name] never had any easy bleeding. She did not have any bleeding  complications. during her pregnancies or deliveries.  Feb. 4, 2014: Lauren Montes is doing well.  Her HR and BP have been well controlled.   June 17, 2013:  Lauren Montes is doing well.  She has started waking over the past several weeks.    June 21, 2014:  Lauren Montes is having some issues with DOE.    Perhaps slight chest pressure with exertion.  No pain. .  Sleeps on 2 pillows occasionally at night - not every night.  [name] very careful with her salt intake. She and her husband thinks most of her meals of scratch. She denies any leg swelling   Nov. 17, 2015:  Lauren Montes is doing well.  She is seen for chronic dyspnea. Her echo was normal.  myoview showed no ischemia Occasional heart racing.  Not severe.  Does not take any meds.  Resolves after several minutes. She is walking for exercise - 30-45 minutes 3 times a week.  Also do some strength training  Nov. 17, 2016: Overall doing ok Lots of stress - aging parents  Lots of muscle cramps in legs and  feet.   Feb. 27, 2017:  Lauren Montes is seen back  Cholesterol is back up - she was not able to takes atorvastatin or Pravachol because of severe muscle aches and cramping  And mental foggyness. She has  Tried  Crestor in the past and it caused muscle cramps as well . [name] now on Zetia.  May 18 , 2017:  Lauren Montes is seen today for a work in visit.  She has been having lots of swelling in her feet and hands ( feet > hands)  We gave her Lasix and Kdur to take for several days.  She took the Lasix and Kdur for 2 days .   Has not needed it sense  has had some fatigue  Has not really eaten any more salt.  This has been occuring off and of for the past 6 weeks.  Is having palpitations at night  She has had increased anxiety.  Still able to do all of her usual activities without dyspnea.   She recently stopped taking her low dose estrogen ( 2-3 weeks ago )   Nov. 20, 2017:     No issues,  No SVT Takes 1/2 zoloft a day - anxiety may have been plaing a part  Has really cut down on the cholesterol in her diet  Getting exercise. Has lost 20 lbs.  Would like to  decrease the crestor to every other day instead of daily .  Nov. 20, 2018  Doing great No significant  Tachycardia Has greatly reduced her meat intake.   Has a seafood allergy Eats some chicken.    Lots of beans .     Current Outpatient Medications  Medication Sig Dispense Refill  . cycloSPORINE (RESTASIS) 0.05 % ophthalmic emulsion Place 1 drop into both eyes 2 (two) times daily.    Marland Kitchen dicyclomine (BENTYL) 10 MG capsule TAKE 1 CAPSULE BY MOUTH 4 TIMES DAILY - BEFORE MEALS AND AT BEDTIME. 120 capsule 3  . diltiazem (CARDIZEM CD) 120 MG 24 hr capsule TAKE ONE CAPSULE BY MOUTH EVERY DAY 90 capsule 3  . DULERA 200-5 MCG/ACT AERO Inhale 2 puffs into the lungs 2 (two) times daily.  5  . gabapentin (NEURONTIN) 300 MG capsule Take 1 capsule (300 mg total) by mouth 2 (two) times daily. 180 capsule 1  . montelukast (SINGULAIR) 10 MG tablet Take  10 mg by mouth at bedtime.      . Omega-3 Fatty Acids (OMEGA 3 PO) Take 1 capsule by mouth daily.      Marland Kitchen PROAIR HFA 108 (90 BASE) MCG/ACT inhaler Inhale 2 puffs into the lungs every 6 (six) hours as needed for wheezing or shortness of breath.   11  . rosuvastatin (CRESTOR) 5 MG tablet Take 1 tablet (5 mg total) by mouth daily at 6 PM. 30 tablet 11  . sertraline (ZOLOFT) 50 MG tablet 1/2 TABLET ONCE DAILY  2  . tiZANidine (ZANAFLEX) 4 MG tablet Take one at bedtime prn 90 tablet 1   Current Facility-Administered Medications  Medication Dose Route Frequency Provider Last Rate Last Dose  . 0.9 %  sodium chloride infusion  500 mL Intravenous Continuous Ladene Artist, MD         Allergies  Allergen Reactions  . Codeine Nausea And Vomiting    Migraine headache  . Lipitor [Atorvastatin] Other (See Comments)    Muscle ache, mind foggy,   . Latex Hives and Rash    Past Medical History:  Diagnosis Date  . Asthma   . Atrophic vaginitis   . Blood transfusion without reported diagnosis 1975   with vaginal delivery--very anemic  . Cystic teratoma   . Elevated cholesterol   . Endometriosis   . Esophageal ulcer   . Fibromyalgia   . Fundic gland polyps of stomach, benign   . GERD (gastroesophageal reflux disease)   . History of diastolic dysfunction   . History of migraine headaches   . Leg edema    left leg  . Ocular rosacea   . Osteoarthritis    back,hands, knees  . Overactive bladder   . Raynaud's disease   . Tachycardia   . Tubular adenoma of colon 08/2016    Past Surgical History:  Procedure Laterality Date  . APPENDECTOMY    . BACK SURGERY    . BREAST SURGERY  10-09-13   breast reduction--Dr. Dessie Coma  . BUNIONECTOMY     bilateral  . CARDIOVASCULAR STRESS TEST  07/02/2008   EF 55-60%  . colonscopy    . DENTAL SURGERY  2007  . ESOPHAGOGASTRODUODENOSCOPY ENDOSCOPY    . FOOT SURGERY     LEFT FOOT  . left breast mass removed    . OOPHORECTOMY  1982   RSO partial  and LSO, uterus  . REDUCTION MAMMAPLASTY Bilateral   . RSO  2002   partial  . ruptured disc surgery  07,08  . TOTAL ABDOMINAL HYSTERECTOMY      Social History   Tobacco Use  Smoking Status Never Smoker  Smokeless Tobacco Never Used    Social History   Substance and Sexual Activity  Alcohol Use Yes  . Alcohol/week: 1.8 - 2.4 oz  . Types: 3 - 4 Glasses of wine per week    Family History  Problem Relation Age of Onset  . Coronary artery disease Father   . Hypertension Father   . Heart disease Father   . Heart attack Father   . Asthma Mother   . Hypertension Mother   . Alzheimer's disease Maternal Grandmother   . Lung cancer Maternal Grandfather   . Heart attack Maternal Grandfather   . Heart failure Paternal Grandfather   . Heart attack Paternal Grandfather   . Skin cancer Sister        melanoma  . Hepatitis C Brother   . Skin cancer Brother        melanoma  . Pancreatic cancer Maternal Aunt   . Colon cancer Neg Hx   . Colon polyps Neg Hx   . Rectal cancer Neg Hx   . Stomach cancer Neg Hx   . Breast cancer Neg Hx     Reviw of Systems:  Reviewed in the HPI.  All other systems are negative.  Physical Exam: Blood pressure 120/72, pulse 78, height 5' 5.5" (1.664 m), weight 138 lb 1.9 oz (62.7 kg), last menstrual period 11/07/1980, SpO2 97 %.  GEN:  Well nourished, well developed in no acute distress HEENT: Normal NECK: No JVD; No carotid bruits LYMPHATICS: No lymphadenopathy CARDIAC: RR, no murmurs, rubs, gallops RESPIRATORY:  Clear to auscultation without rales, wheezing or rhonchi  ABDOMEN: Soft, non-tender, non-distended MUSCULOSKELETAL:  No edema; No deformity  SKIN: Warm and dry NEUROLOGIC:  Alert and oriented x 3   ECG: September 26, 2017: Normal sinus rhythm at 70.  She has occasional PACs.  Assessment / Plan:   1.chronic diastolic CHF: Lauren Montes seems to be doing great.  She has not had any episodes of chest pain or shortness of breath.  2.  Essential hypertension: Blood pressure is very well controlled.  3. Hyperlipidemia:     Taking the crestor 5 mg 5 days a week .   We will check labs today.  4. Supraventricular  tachycardia: Stable. On Diltiazem 120 mg a day      Mertie Moores, MD  09/26/2017 2:56 PM    [address],  [place] [place], [place]  [address] Pager [phone] Phone: [phone]; Fax: [phone]

## 2017-09-27 LAB — BASIC METABOLIC PANEL
BUN / CREAT RATIO: 15 (ref 12–28)
BUN: 10 mg/dL (ref 8–27)
CALCIUM: 9.8 mg/dL (ref 8.7–10.3)
CHLORIDE: 104 mmol/L (ref 96–106)
CO2: 24 mmol/L (ref 20–29)
Creatinine, Ser: 0.67 mg/dL (ref 0.57–1.00)
GFR calc non Af Amer: 95 mL/min/{1.73_m2} (ref >59)
GFR, EST AFRICAN AMERICAN: 110 mL/min/{1.73_m2} (ref >59)
Glucose: 79 mg/dL (ref 65–99)
POTASSIUM: 4.2 mmol/L (ref 3.5–5.2)
SODIUM: 145 mmol/L — AB (ref 134–144)

## 2017-09-27 LAB — LIPID PANEL
CHOL/HDL RATIO: 2.1 ratio (ref 0.0–4.4)
Cholesterol, Total: 137 mg/dL (ref 100–199)
HDL: 65 mg/dL (ref >39)
LDL CALC: 60 mg/dL (ref 0–99)
TRIGLYCERIDES: 59 mg/dL (ref 0–149)
VLDL CHOLESTEROL CAL: 12 mg/dL (ref 5–40)

## 2017-09-27 LAB — HEPATIC FUNCTION PANEL
ALT: 23 IU/L (ref 0–32)
AST: 27 IU/L (ref 0–40)
Albumin: 4.8 g/dL (ref 3.6–4.8)
Alkaline Phosphatase: 91 IU/L (ref 39–117)
Bilirubin Total: 0.5 mg/dL (ref 0.0–1.2)
Bilirubin, Direct: 0.12 mg/dL (ref 0.00–0.40)
Total Protein: 7.1 g/dL (ref 6.0–8.5)

## 2017-10-20 ENCOUNTER — Other Ambulatory Visit: Payer: Self-pay | Admitting: Gastroenterology

## 2017-10-23 ENCOUNTER — Other Ambulatory Visit: Payer: Self-pay | Admitting: Gastroenterology

## 2017-10-23 MED ORDER — DICYCLOMINE HCL 10 MG PO CAPS: ORAL_CAPSULE | ORAL | 1 refills | Status: DC

## 2017-10-23 NOTE — Telephone Encounter (Signed)
Bentyl refilled until upcoming appointment

## 2017-11-09 DIAGNOSIS — J45901 Unspecified asthma with (acute) exacerbation: Secondary | ICD-10-CM | POA: Diagnosis not present

## 2017-11-09 DIAGNOSIS — J4540 Moderate persistent asthma, uncomplicated: Secondary | ICD-10-CM | POA: Diagnosis not present

## 2017-11-09 DIAGNOSIS — R05 Cough: Secondary | ICD-10-CM | POA: Diagnosis not present

## 2017-11-09 DIAGNOSIS — Z6822 Body mass index (BMI) 22.0-22.9, adult: Secondary | ICD-10-CM | POA: Diagnosis not present

## 2017-11-13 ENCOUNTER — Other Ambulatory Visit: Payer: Self-pay

## 2017-11-13 MED ORDER — DICYCLOMINE HCL 10 MG PO CAPS: ORAL_CAPSULE | ORAL | 0 refills | Status: DC

## 2017-11-14 ENCOUNTER — Other Ambulatory Visit: Payer: Self-pay

## 2017-11-14 MED ORDER — DICYCLOMINE HCL 10 MG PO CAPS: ORAL_CAPSULE | ORAL | 0 refills | Status: DC

## 2017-11-26 ENCOUNTER — Other Ambulatory Visit: Payer: Self-pay | Admitting: Cardiovascular Disease

## 2017-12-01 NOTE — Progress Notes (Signed)
Office Visit Note  Patient: Lauren Montes             Date of Birth: 1956-01-19           MRN: [id number]             PCP: Haywood Pao, MD Referring: Haywood Pao, MD Visit Date: 12/05/2017 Occupation: @GUAROCC @    Subjective:  Joint and muscle pain.   History of Present Illness: Lauren Montes is a 62 y.o. female with history of fibromyalgia, disc disease.she states she's been doing fairly well on current combination of medications. She does have occasional flares with pain and lower back and trochanteric area. Raynauds symptoms are mild. [name] been tolerating her medications well.  Activities of Daily Living:  Patient reports morning stiffness for 1 hour.   Patient Denies nocturnal pain.  Difficulty dressing/grooming: Denies Difficulty climbing stairs: Denies Difficulty getting out of chair: Denies Difficulty using hands for taps, buttons, cutlery, and/or writing: Denies   Review of Systems  Constitutional: Negative for fatigue, night sweats, weight gain, weight loss and weakness.  HENT: Positive for mouth dryness. Negative for mouth sores, trouble swallowing, trouble swallowing and nose dryness.   Eyes: Positive for dryness. Negative for pain, redness and visual disturbance.  Respiratory: Negative for cough, shortness of breath and difficulty breathing.   Cardiovascular: Negative for chest pain, palpitations, hypertension, irregular heartbeat and swelling in legs/feet.  Gastrointestinal: Positive for constipation and diarrhea. Negative for blood in stool.  Endocrine: Negative for increased urination.  Genitourinary: Negative for vaginal dryness.  Musculoskeletal: Positive for arthralgias, joint pain and morning stiffness. Negative for joint swelling, myalgias, muscle weakness, muscle tenderness and myalgias.  Skin: Positive for color change. Negative for rash, hair loss, skin tightness, ulcers and sensitivity to sunlight.  Allergic/Immunologic: Negative for  susceptible to infections.  Neurological: Negative for dizziness, memory loss and night sweats.  Hematological: Negative for swollen glands.  Psychiatric/Behavioral: Positive for depressed mood and sleep disturbance. The patient is not nervous/anxious.     PMFS History:  Patient Active Problem List   Diagnosis Date Noted  . SVT (supraventricular tachycardia) ([place]) 09/26/2016  . HTN (hypertension) 09/24/2015  . Palpitations 03/05/2015  . Fibromyalgia 03/05/2015  . Chronic diastolic CHF (congestive heart failure), NYHA class 2 ([place]) 06/20/2014  . Sinus tachycardia 09/14/2012  . Dysphagia 06/29/2012  . Ocular rosacea   . GERD (gastroesophageal reflux disease)   . Cough 09/09/2011  . Chest tightness, discomfort, or pressure 09/09/2011  . Hyperlipidemia 05/10/2011  . SWELLING OF LIMB 01/20/2011  . GERD 02/03/2010  . ABDOMINAL PAIN-EPIGASTRIC 02/03/2010  . ABDOMINAL PAIN-MULTIPLE SITES 02/03/2010  . CONSTIPATION 09/19/2008  . ABDOMINAL PAIN, LEFT LOWER QUADRANT 09/19/2008  . COLONIC POLYPS, HYPERPLASTIC, HX OF 09/19/2008    Past Medical History:  Diagnosis Date  . Asthma   . Atrophic vaginitis   . Blood transfusion without reported diagnosis 1975   with vaginal delivery--very anemic  . Cystic teratoma   . Elevated cholesterol   . Endometriosis   . Esophageal ulcer   . Fibromyalgia   . Fundic gland polyps of stomach, benign   . GERD (gastroesophageal reflux disease)   . History of diastolic dysfunction   . History of migraine headaches   . Leg edema    left leg  . Ocular rosacea   . Osteoarthritis    back,hands, knees  . Overactive bladder   . Raynaud's disease   . Tachycardia   . Tubular adenoma of colon 08/2016  Family History  Problem Relation Age of Onset  . Coronary artery disease Father   . Hypertension Father   . Heart disease Father   . Heart attack Father   . Asthma Mother   . Hypertension Mother   . Alzheimer's disease Maternal Grandmother   . Lung  cancer Maternal Grandfather   . Heart attack Maternal Grandfather   . Heart failure Paternal Grandfather   . Heart attack Paternal Grandfather   . Skin cancer Sister        melanoma  . Hepatitis C Brother   . Skin cancer Brother        melanoma  . Pancreatic cancer Maternal Aunt   . Colon cancer Neg Hx   . Colon polyps Neg Hx   . Rectal cancer Neg Hx   . Stomach cancer Neg Hx   . Breast cancer Neg Hx    Past Surgical History:  Procedure Laterality Date  . APPENDECTOMY    . BACK SURGERY    . BREAST SURGERY  10-09-13   breast reduction--Dr. Dessie Coma  . BUNIONECTOMY     bilateral  . CARDIOVASCULAR STRESS TEST  07/02/2008   EF 55-60%  . colonscopy    . DENTAL SURGERY  2007  . ESOPHAGOGASTRODUODENOSCOPY ENDOSCOPY    . FOOT SURGERY     LEFT FOOT  . left breast mass removed    . OOPHORECTOMY  1982   RSO partial and LSO, uterus  . REDUCTION MAMMAPLASTY Bilateral   . RSO  2002   partial  . ruptured disc surgery  07,08  . TOTAL ABDOMINAL HYSTERECTOMY     Social History   Social History Narrative  . Not on file     Objective: Vital Signs: BP 106/66 (BP Location: Left Arm, Patient Position: Sitting, Cuff Size: Normal)   Pulse 77   Resp 16   Ht 5\' 6"  (1.676 m)   Wt 142 lb (64.4 kg)   LMP 11/07/1980   BMI 22.92 kg/m    Physical Exam  Constitutional: She is oriented to person, place, and time. She appears well-developed and well-nourished.  HENT:  Head: Normocephalic and atraumatic.  Eyes: Conjunctivae and EOM are normal.  Neck: Normal range of motion.  Cardiovascular: Normal rate, regular rhythm, normal heart sounds and intact distal pulses.  Pulmonary/Chest: Effort normal and breath sounds normal.  Abdominal: Soft. Bowel sounds are normal.  Lymphadenopathy:    She has no cervical adenopathy.  Neurological: She is alert and oriented to person, place, and time.  Skin: Skin is warm and dry. Capillary refill takes less than 2 seconds.  Psychiatric: She has a  normal mood and affect. Her behavior is normal.  Nursing note and vitals reviewed.    Musculoskeletal Exam: C-spine and thoracic lumbar spine good range of motion. She has some tenderness over the coccyx region. No SI joint tenderness was noted. Shoulder joints, elbows, wrist joints, MCPs PIPs DIPs with good range of motion with no synovitis.Hip joints knee joints ankles MTPs PIPs with good range of motion. She is some tenderness over left trochanteric bursa. Fibromyalgia tender points with 10 out of 18 positive.  CDAI Exam: No CDAI exam completed.    Investigation: No additional findings.   Imaging: No results found.  Speciality Comments: No specialty comments available.    Procedures:  No procedures performed Allergies: Codeine; Lipitor [atorvastatin]; and Latex   Assessment / Plan:     Visit Diagnoses: Fibromyalgia: She reports occasional flares with increased pain. On current combination  of medications including Neurontin and tizanidine she is doing much better.prescription refill for Neurontin and tizanidine was given.she gets yearly labs with her PCP. I've advised to forward the labs to [place].  Raynaud's disease without gangrene - warm clothing was discussed.  h/o sicca : [name] been using Restasis and over-the-counter products which is been helpful.  DDD (degenerative disc disease), lumbar - s/p discectomy : She does have some lower back pain.  Trochanteric bursitis, unspecified laterality: She has left trochanteric bursitis for which his stretching exercises were discussed. [name] been also going for yoga classes.  History of asthma  History of gastroesophageal reflux (GERD)  History of hypercholesterolemia  History of migraine  History of tachycardia  Ocular rosacea  History of CHF (congestive heart failure)    Orders: No orders of the defined types were placed in this encounter.  Meds ordered this encounter  Medications  . tiZANidine (ZANAFLEX) 4 MG tablet     Sig: Take one at bedtime prn    Dispense:  90 tablet    Refill:  1  . gabapentin (NEURONTIN) 300 MG capsule    Sig: Take 1 capsule (300 mg total) by mouth at bedtime.    Dispense:  90 capsule    Refill:  1    Face-to-face time spent with patient was 30 minutes. Greater than 50% of time was spent in counseling and coordination of care.  Follow-Up Instructions: Return in about 6 months (around 06/04/2018) for FMS DDD.   Bo Merino, MD  Note - This record has been created using Editor, commissioning.  Chart creation errors have been sought, but may not always  have been located. Such creation errors do not reflect on  the standard of medical care.

## 2017-12-05 ENCOUNTER — Encounter: Payer: Self-pay | Admitting: Rheumatology

## 2017-12-05 ENCOUNTER — Ambulatory Visit: Payer: BLUE CROSS/BLUE SHIELD | Admitting: Rheumatology

## 2017-12-05 VITALS — BP 106/66 | HR 77 | Resp 16 | Ht 66.0 in | Wt 142.0 lb

## 2017-12-05 DIAGNOSIS — Z8669 Personal history of other diseases of the nervous system and sense organs: Secondary | ICD-10-CM

## 2017-12-05 DIAGNOSIS — Z8639 Personal history of other endocrine, nutritional and metabolic disease: Secondary | ICD-10-CM

## 2017-12-05 DIAGNOSIS — Z87898 Personal history of other specified conditions: Secondary | ICD-10-CM | POA: Diagnosis not present

## 2017-12-05 DIAGNOSIS — I7300 Raynaud's syndrome without gangrene: Secondary | ICD-10-CM | POA: Diagnosis not present

## 2017-12-05 DIAGNOSIS — M7060 Trochanteric bursitis, unspecified hip: Secondary | ICD-10-CM

## 2017-12-05 DIAGNOSIS — Z8679 Personal history of other diseases of the circulatory system: Secondary | ICD-10-CM | POA: Diagnosis not present

## 2017-12-05 DIAGNOSIS — Z8709 Personal history of other diseases of the respiratory system: Secondary | ICD-10-CM | POA: Diagnosis not present

## 2017-12-05 DIAGNOSIS — M5136 Other intervertebral disc degeneration, lumbar region: Secondary | ICD-10-CM

## 2017-12-05 DIAGNOSIS — M797 Fibromyalgia: Secondary | ICD-10-CM

## 2017-12-05 DIAGNOSIS — Z8719 Personal history of other diseases of the digestive system: Secondary | ICD-10-CM

## 2017-12-05 DIAGNOSIS — L718 Other rosacea: Secondary | ICD-10-CM

## 2017-12-05 MED ORDER — GABAPENTIN 300 MG PO CAPS: 300.0000 mg | ORAL_CAPSULE | Freq: Every day | ORAL | 1 refills | Status: DC

## 2017-12-05 MED ORDER — TIZANIDINE HCL 4 MG PO TABS: ORAL_TABLET | ORAL | 1 refills | Status: DC

## 2017-12-12 ENCOUNTER — Encounter: Payer: Self-pay | Admitting: Gastroenterology

## 2017-12-12 ENCOUNTER — Ambulatory Visit (INDEPENDENT_AMBULATORY_CARE_PROVIDER_SITE_OTHER): Payer: BLUE CROSS/BLUE SHIELD | Admitting: Gastroenterology

## 2017-12-12 VITALS — BP 100/68 | HR 76 | Ht 66.0 in | Wt 139.8 lb

## 2017-12-12 DIAGNOSIS — K580 Irritable bowel syndrome with diarrhea: Secondary | ICD-10-CM

## 2017-12-12 DIAGNOSIS — Z86010 Personal history of colonic polyps: Secondary | ICD-10-CM

## 2017-12-12 MED ORDER — DICYCLOMINE HCL 10 MG PO CAPS: ORAL_CAPSULE | ORAL | 11 refills | Status: DC

## 2017-12-12 NOTE — Progress Notes (Signed)
    History of Present Illness: This is a 62 year old female returning for follow-up of IBS D.  She relates her symptoms are under excellent control on dicyclomine 4 times daily.  She states she is tried to discontinue the medication however her symptoms return promptly and she resumes medication with again good control of symptoms.  No other gastrointestinal complaints.  Current Medications, Allergies, Past Medical History, Past Surgical History, Family History and Social History were reviewed in [employer] record.  Physical Exam: General: Well developed, well nourished, no acute distress Head: Normocephalic and atraumatic Eyes:  sclerae anicteric, EOMI Ears: Normal auditory acuity Mouth: No deformity or lesions Lungs: Clear throughout to auscultation Heart: Regular rate and rhythm; no murmurs, rubs or bruits Abdomen: Soft, non tender and non distended. No masses, hepatosplenomegaly or hernias noted. Normal Bowel sounds Rectal: not done Musculoskeletal: Symmetrical with no gross deformities  Pulses:  Normal pulses noted Extremities: No clubbing, cyanosis, edema or deformities noted Neurological: Alert oriented x 4, grossly nonfocal Psychological:  Alert and cooperative. Normal mood and affect  Assessment and Recommendations:  1. IBS-D, well controlled.  Continue dicyclomine 10 mg before meals and at bedtime.  Avoid foods that trigger symptoms.  REV in 1 year.   2. Personal history of adenomatous colon polyps.  A 5-year interval surveillance colonoscopy is due in 08/2021.

## 2017-12-12 NOTE — Patient Instructions (Signed)
We have sent the following medications to your pharmacy for you to pick up at your convenience:dicyclomine.   Thank you for choosing me and [facility].  Malcolm T. Stark, Jr., MD., FACG   

## 2017-12-28 DIAGNOSIS — Z0000 Encounter for general adult medical examination without abnormal findings: Secondary | ICD-10-CM | POA: Diagnosis not present

## 2018-01-04 DIAGNOSIS — Z0000 Encounter for general adult medical examination without abnormal findings: Secondary | ICD-10-CM | POA: Diagnosis not present

## 2018-01-04 DIAGNOSIS — K589 Irritable bowel syndrome without diarrhea: Secondary | ICD-10-CM | POA: Diagnosis not present

## 2018-01-04 DIAGNOSIS — Z1389 Encounter for screening for other disorder: Secondary | ICD-10-CM | POA: Diagnosis not present

## 2018-01-04 DIAGNOSIS — E7800 Pure hypercholesterolemia, unspecified: Secondary | ICD-10-CM | POA: Diagnosis not present

## 2018-01-04 DIAGNOSIS — M5416 Radiculopathy, lumbar region: Secondary | ICD-10-CM | POA: Diagnosis not present

## 2018-01-04 DIAGNOSIS — I7300 Raynaud's syndrome without gangrene: Secondary | ICD-10-CM | POA: Diagnosis not present

## 2018-01-08 NOTE — Progress Notes (Signed)
Office Visit Note  Patient: Lauren Montes             Date of Birth: [birth year]           MRN: [id number]             PCP: Haywood Pao, MD Referring: Haywood Pao, MD Visit Date: 01/09/2018 Occupation: @GUAROCC @    Subjective:  Other (flare; low back and right hip )   History of Present Illness: Lauren Montes is a 62 y.o. female with history of fibromyalgia, DDD and osteoarthritis.  She states for the last 1 week she has been having discomfort in her lower back and her right hip which she describes over the trochanteric bursa.  Is been having throbbing sensation in her right hip area.  Fibromyalgia symptoms are tolerable.  Raynauds is not very active currently.  He is to have sicca symptoms especially dry eyes.  Activities of Daily Living:  Patient reports morning stiffness for 1 hour.   Patient Reports nocturnal pain.  Difficulty dressing/grooming: Reports Difficulty climbing stairs: Denies Difficulty getting out of chair: Reports Difficulty using hands for taps, buttons, cutlery, and/or writing: Denies   Review of Systems  Constitutional: Negative for fatigue, night sweats, weight gain, weight loss and weakness.  HENT: Negative for mouth sores, trouble swallowing, trouble swallowing, mouth dryness and nose dryness.   Eyes: Negative for pain, redness, visual disturbance and dryness.  Respiratory: Negative for cough, shortness of breath and difficulty breathing.   Cardiovascular: Negative for chest pain, palpitations, hypertension, irregular heartbeat and swelling in legs/feet.  Gastrointestinal: Negative for blood in stool, constipation, diarrhea and nausea.  Endocrine: Negative for increased urination.  Genitourinary: Negative for pelvic pain and vaginal dryness.  Musculoskeletal: Positive for morning stiffness. Negative for arthralgias, joint pain, joint swelling, myalgias, muscle weakness, muscle tenderness and myalgias.  Skin: Negative for color change,  rash, hair loss, redness, skin tightness, ulcers and sensitivity to sunlight.  Allergic/Immunologic: Negative for susceptible to infections.  Neurological: Negative for dizziness, numbness, memory loss and night sweats.  Hematological: Negative for bruising/bleeding tendency and swollen glands.  Psychiatric/Behavioral: Negative for depressed mood, confusion and sleep disturbance. The patient is not nervous/anxious.     PMFS History:  Patient Active Problem List   Diagnosis Date Noted  . SVT (supraventricular tachycardia) ([place]) 09/26/2016  . HTN (hypertension) 09/24/2015  . Palpitations 03/05/2015  . Fibromyalgia 03/05/2015  . Chronic diastolic CHF (congestive heart failure), NYHA class 2 ([place]) 06/20/2014  . Sinus tachycardia 09/14/2012  . Dysphagia 06/29/2012  . Ocular rosacea   . GERD (gastroesophageal reflux disease)   . Cough 09/09/2011  . Chest tightness, discomfort, or pressure 09/09/2011  . Hyperlipidemia 05/10/2011  . SWELLING OF LIMB 01/20/2011  . GERD 02/03/2010  . ABDOMINAL PAIN-EPIGASTRIC 02/03/2010  . ABDOMINAL PAIN-MULTIPLE SITES 02/03/2010  . CONSTIPATION 09/19/2008  . ABDOMINAL PAIN, LEFT LOWER QUADRANT 09/19/2008  . COLONIC POLYPS, HYPERPLASTIC, HX OF 09/19/2008    Past Medical History:  Diagnosis Date  . Asthma   . Atrophic vaginitis   . Blood transfusion without reported diagnosis 1975   with vaginal delivery--very anemic  . Cystic teratoma   . Elevated cholesterol   . Endometriosis   . Esophageal ulcer   . Fibromyalgia   . Fundic gland polyps of stomach, benign   . GERD (gastroesophageal reflux disease)   . History of diastolic dysfunction   . History of migraine headaches   . Leg edema    left leg  .  Ocular rosacea   . Osteoarthritis    back,hands, knees  . Overactive bladder   . Raynaud's disease   . Tachycardia   . Tubular adenoma of colon 08/2016    Family History  Problem Relation Age of Onset  . Coronary artery disease Father   .  Hypertension Father   . Heart disease Father   . Heart attack Father   . Asthma Mother   . Hypertension Mother   . Alzheimer's disease Maternal Grandmother   . Lung cancer Maternal Grandfather   . Heart attack Maternal Grandfather   . Heart failure Paternal Grandfather   . Heart attack Paternal Grandfather   . Skin cancer Sister        melanoma  . Hepatitis C Brother   . Skin cancer Brother        melanoma  . Pancreatic cancer Maternal Aunt   . Colon cancer Neg Hx   . Colon polyps Neg Hx   . Rectal cancer Neg Hx   . Stomach cancer Neg Hx   . Breast cancer Neg Hx    Past Surgical History:  Procedure Laterality Date  . APPENDECTOMY    . BACK SURGERY    . BREAST SURGERY  10-09-13   breast reduction--Dr. Dessie Coma  . BUNIONECTOMY     bilateral  . CARDIOVASCULAR STRESS TEST  07/02/2008   EF 55-60%  . colonscopy    . DENTAL SURGERY  2007  . ESOPHAGOGASTRODUODENOSCOPY ENDOSCOPY    . FOOT SURGERY     LEFT FOOT  . left breast mass removed    . OOPHORECTOMY  1982   RSO partial and LSO, uterus  . REDUCTION MAMMAPLASTY Bilateral   . RSO  2002   partial  . ruptured disc surgery  07,08  . TOTAL ABDOMINAL HYSTERECTOMY     Social History   Social History Narrative  . Not on file     Objective: Vital Signs: BP 107/73 (BP Location: Left Arm, Patient Position: Sitting, Cuff Size: Normal)   Pulse 82   Resp 16   Ht 5' 5.5" (1.664 m)   Wt 142 lb (64.4 kg)   LMP 11/07/1980   BMI 23.27 kg/m    Physical Exam  Constitutional: She is oriented to person, place, and time. She appears well-developed and well-nourished.  HENT:  Head: Normocephalic and atraumatic.  Eyes: Conjunctivae and EOM are normal.  Neck: Normal range of motion.  Cardiovascular: Normal rate, regular rhythm, normal heart sounds and intact distal pulses.  Pulmonary/Chest: Effort normal and breath sounds normal.  Abdominal: Soft. Bowel sounds are normal.  Lymphadenopathy:    She has no cervical adenopathy.    Neurological: She is alert and oriented to person, place, and time.  Skin: Skin is warm and dry. Capillary refill takes less than 2 seconds.  Psychiatric: She has a normal mood and affect. Her behavior is normal.  Nursing note and vitals reviewed.    Musculoskeletal Exam: C-spine thoracic spine good range of motion.  She has limitation of range of motion of lumbar spine with some discomfort.  Shoulder joints elbow joints wrist joints are good range of motion.  She has minimal DIP PIP thickening in her hands.  She has severe tenderness over right trochanteric bursa with tightness in her IT band.  Hip joints knee joints ankles MTPs PIPs with good range of motion with no synovitis.  Fibromyalgia tender points only 8 out of 18 positive.  CDAI Exam: No CDAI exam completed.  Investigation: No additional findings.   Imaging: No results found.  Speciality Comments: No specialty comments available.    Procedures:  Large Joint Inj: R greater trochanter on 01/09/2018 11:45 AM Indications: pain Details: 27 G 1.5 in needle, lateral approach  Arthrogram: No  Medications: 1.5 mL lidocaine (PF) 1 %; 60 mg triamcinolone acetonide 40 MG/ML Aspirate: 0 mL Outcome: tolerated well, no immediate complications Procedure, treatment alternatives, risks and benefits explained, specific risks discussed. Consent was given by the patient. Immediately prior to procedure a time out was called to verify the correct patient, procedure, equipment, support staff and site/side marked as required. Patient was prepped and draped in the usual sterile fashion.     Allergies: Codeine; Lipitor [atorvastatin]; and Latex   Assessment / Plan:     Visit Diagnoses: Fibromyalgia: She continues to have some generalized pain and discomfort.  She is not flaring currently.  Raynaud's disease without gangrene - h/o sicca : [name] been using Restasis and over-the-counter products which is been helpful.  DDD (degenerative  disc disease), lumbar - s/p discectomy.  She continues to have some lower back discomfort.  Trochanteric bursitis, unspecified laterality: She has been having severe pain and discomfort in her right trochanteric bursa.  After different treatment options were discussed right trochanteric bursa was injected with cortisone as described above.  She tolerated the procedure well.  Have encouraged her to do IT band exercises.  Other medical problems are listed as follows:  History of asthma  History of CHF (congestive heart failure)  History of gastroesophageal reflux (GERD)  Ocular rosacea  History of hypercholesterolemia  History of tachycardia  History of migraine    Orders: Orders Placed This Encounter  Procedures  . Large Joint Inj   No orders of the defined types were placed in this encounter.   Face-to-face time spent with patient was 30 minutes.  Greater than 50% of time was spent in counseling and coordination of care.  Follow-Up Instructions: Return in about 6 months (around 07/12/2018) for FMS, DDD.   Bo Merino, MD  Note - This record has been created using Editor, commissioning.  Chart creation errors have been sought, but may not always  have been located. Such creation errors do not reflect on  the standard of medical care.

## 2018-01-09 ENCOUNTER — Ambulatory Visit: Payer: BLUE CROSS/BLUE SHIELD | Admitting: Rheumatology

## 2018-01-09 ENCOUNTER — Encounter: Payer: Self-pay | Admitting: Rheumatology

## 2018-01-09 VITALS — BP 107/73 | HR 82 | Resp 16 | Ht 65.5 in | Wt 142.0 lb

## 2018-01-09 DIAGNOSIS — Z87898 Personal history of other specified conditions: Secondary | ICD-10-CM

## 2018-01-09 DIAGNOSIS — Z8669 Personal history of other diseases of the nervous system and sense organs: Secondary | ICD-10-CM

## 2018-01-09 DIAGNOSIS — M7060 Trochanteric bursitis, unspecified hip: Secondary | ICD-10-CM

## 2018-01-09 DIAGNOSIS — M5136 Other intervertebral disc degeneration, lumbar region: Secondary | ICD-10-CM

## 2018-01-09 DIAGNOSIS — Z8679 Personal history of other diseases of the circulatory system: Secondary | ICD-10-CM

## 2018-01-09 DIAGNOSIS — Z8639 Personal history of other endocrine, nutritional and metabolic disease: Secondary | ICD-10-CM | POA: Diagnosis not present

## 2018-01-09 DIAGNOSIS — M7061 Trochanteric bursitis, right hip: Secondary | ICD-10-CM

## 2018-01-09 DIAGNOSIS — I7300 Raynaud's syndrome without gangrene: Secondary | ICD-10-CM

## 2018-01-09 DIAGNOSIS — M797 Fibromyalgia: Secondary | ICD-10-CM | POA: Diagnosis not present

## 2018-01-09 DIAGNOSIS — Z8719 Personal history of other diseases of the digestive system: Secondary | ICD-10-CM | POA: Diagnosis not present

## 2018-01-09 DIAGNOSIS — L718 Other rosacea: Secondary | ICD-10-CM

## 2018-01-09 DIAGNOSIS — Z8709 Personal history of other diseases of the respiratory system: Secondary | ICD-10-CM

## 2018-01-09 MED ORDER — TRIAMCINOLONE ACETONIDE 40 MG/ML IJ SUSP
60.0000 mg | INTRAMUSCULAR | Status: AC | PRN
  Administered 2018-01-09: 60 mg via INTRA_ARTICULAR

## 2018-01-09 MED ORDER — LIDOCAINE HCL (PF) 1 % IJ SOLN
1.5000 mL | INTRAMUSCULAR | Status: AC | PRN
  Administered 2018-01-09: 1.5 mL

## 2018-03-14 ENCOUNTER — Other Ambulatory Visit: Payer: Self-pay

## 2018-03-14 ENCOUNTER — Encounter: Payer: Self-pay | Admitting: Obstetrics and Gynecology

## 2018-03-14 ENCOUNTER — Ambulatory Visit (INDEPENDENT_AMBULATORY_CARE_PROVIDER_SITE_OTHER): Payer: BLUE CROSS/BLUE SHIELD | Admitting: Obstetrics and Gynecology

## 2018-03-14 VITALS — BP 110/58 | HR 68 | Resp 14 | Ht 65.5 in | Wt 138.0 lb

## 2018-03-14 DIAGNOSIS — Z01419 Encounter for gynecological examination (general) (routine) without abnormal findings: Secondary | ICD-10-CM

## 2018-03-14 DIAGNOSIS — R3915 Urgency of urination: Secondary | ICD-10-CM | POA: Diagnosis not present

## 2018-03-14 LAB — POCT URINALYSIS DIPSTICK
BILIRUBIN UA: NEGATIVE
GLUCOSE UA: NEGATIVE
Ketones, UA: NEGATIVE
Nitrite, UA: NEGATIVE
Protein, UA: NEGATIVE
RBC UA: NEGATIVE
Urobilinogen, UA: 0.2 E.U./dL
pH, UA: 5 (ref 5.0–8.0)

## 2018-03-14 NOTE — Patient Instructions (Signed)

## 2018-03-14 NOTE — Progress Notes (Addendum)
62 y.o. G50P1001 Married Caucasian female here for annual exam. Patient complains of having urinary urgency for 4 - 6 weeks.  Can have incontinence.  DF - 4 -5 times per day.  Can be every 30 minutes.  NF - maybe once.  No dysuria or hematuria. Does not want medication.  Would like reassurance that she does not have infection.  Feeling like she is in a good place hormonally.  Hot flashes are less than one year ago. Some vaginal dryness.  Use KY jelly.  Urine Dip: small amount of WBC.  Does labs with PCP.   Brother just diagnosed with early onset Alzheimer's.   PCP: Dr. Osborne Casco    Patient's last menstrual period was 11/07/1980.           Sexually active: Yes.    The current method of family planning is status post hysterectomy.    Exercising: No.  The patient does not participate in regular exercise at present. Smoker:  no  Health Maintenance: Pap:  03-06-15 Neg:Neg HR HPV History of abnormal Pap:  no MMG:  09/05/17 BIRADS 1 negative/density b Colonoscopy:  08/2016, polyp removed --normal.  Due in 2025.  BMD:   2015  Result  normal TDaP:  Due next year per PCP Gardasil:   n/a HIV: never Hep C: per patient possibly done in the past through her PCP.  Screening Labs:  PCP   reports that she has never smoked. She has never used smokeless tobacco. She reports that she drinks alcohol. She reports that she does not use drugs.  Past Medical History:  Diagnosis Date  . Asthma   . Atrophic vaginitis   . Blood transfusion without reported diagnosis 1975   with vaginal delivery--very anemic  . Cystic teratoma   . Elevated cholesterol   . Endometriosis   . Esophageal ulcer   . Fibromyalgia   . Fundic gland polyps of stomach, benign   . GERD (gastroesophageal reflux disease)   . History of diastolic dysfunction   . History of migraine headaches   . Leg edema    left leg  . Ocular rosacea   . Osteoarthritis    back,hands, knees  . Overactive bladder   . Raynaud's disease    . Tachycardia   . Tubular adenoma of colon 08/2016    Past Surgical History:  Procedure Laterality Date  . APPENDECTOMY    . BACK SURGERY    . BREAST SURGERY  10-09-13   breast reduction--Dr. Dessie Coma  . BUNIONECTOMY     bilateral  . CARDIOVASCULAR STRESS TEST  07/02/2008   EF 55-60%  . colonscopy    . DENTAL SURGERY  2007  . ESOPHAGOGASTRODUODENOSCOPY ENDOSCOPY    . FOOT SURGERY     LEFT FOOT  . left breast mass removed    . OOPHORECTOMY  1982   RSO partial and LSO, uterus  . REDUCTION MAMMAPLASTY Bilateral   . RSO  2002   partial  . ruptured disc surgery  07,08  . TOTAL ABDOMINAL HYSTERECTOMY      Current Outpatient Medications  Medication Sig Dispense Refill  . cetirizine (ZYRTEC) 10 MG tablet Take 10 mg by mouth daily.    . cycloSPORINE (RESTASIS) 0.05 % ophthalmic emulsion Place 1 drop into both eyes 2 (two) times daily.    Marland Kitchen dicyclomine (BENTYL) 10 MG capsule TAKE 1 CAPSULE BY MOUTH 4 TIMES DAILY - BEFORE MEALS AND AT BEDTIME. 120 capsule 11  . diltiazem (CARDIZEM CD) 120 MG 24 hr  capsule Take 1 capsule (120 mg total) by mouth daily. 90 capsule 3  . DULERA 200-5 MCG/ACT AERO Inhale 2 puffs into the lungs 2 (two) times daily.  5  . gabapentin (NEURONTIN) 300 MG capsule Take 1 capsule (300 mg total) by mouth at bedtime. 90 capsule 1  . montelukast (SINGULAIR) 10 MG tablet Take 10 mg by mouth at bedtime.      . Omega-3 Fatty Acids (OMEGA 3 PO) Take 1 capsule by mouth daily.      Marland Kitchen PROAIR HFA 108 (90 BASE) MCG/ACT inhaler Inhale 2 puffs into the lungs every 6 (six) hours as needed for wheezing or shortness of breath.   11  . rosuvastatin (CRESTOR) 5 MG tablet TAKE 1 TABLET (5 MG TOTAL) BY MOUTH DAILY AT 6 PM. 90 tablet 3  . sertraline (ZOLOFT) 50 MG tablet 1/2 TABLET ONCE DAILY  2  . tiZANidine (ZANAFLEX) 4 MG tablet Take one at bedtime prn 90 tablet 1   Current Facility-Administered Medications  Medication Dose Route Frequency Provider Last Rate Last Dose  . 0.9 %   sodium chloride infusion  500 mL Intravenous Continuous Ladene Artist, MD        Family History  Problem Relation Age of Onset  . Coronary artery disease Father   . Hypertension Father   . Heart disease Father   . Heart attack Father   . Asthma Mother   . Hypertension Mother   . Alzheimer's disease Maternal Grandmother   . Lung cancer Maternal Grandfather   . Heart attack Maternal Grandfather   . Heart failure Paternal Grandfather   . Heart attack Paternal Grandfather   . Skin cancer Sister        melanoma  . Hepatitis C Brother   . Skin cancer Brother        melanoma  . Pancreatic cancer Maternal Aunt   . Colon cancer Neg Hx   . Colon polyps Neg Hx   . Rectal cancer Neg Hx   . Stomach cancer Neg Hx   . Breast cancer Neg Hx     Review of Systems  Constitutional: Negative.   HENT: Negative.   Eyes: Negative.   Respiratory: Negative.   Cardiovascular: Negative.   Gastrointestinal: Positive for constipation and diarrhea.  Endocrine: Negative.   Genitourinary:       Urinary urgency  Musculoskeletal: Negative.   Skin: Negative.   Allergic/Immunologic: Negative.   Neurological: Negative.   Hematological: Negative.   Psychiatric/Behavioral: Negative.     Exam:   BP (!) 110/58 (BP Location: Right Arm, Patient Position: Sitting, Cuff Size: Normal)   Pulse 68   Resp 14   Ht 5' 5.5" (1.664 m)   Wt 138 lb (62.6 kg)   LMP 11/07/1980   BMI 22.62 kg/m     General appearance: alert, cooperative and appears stated age Head: Normocephalic, without obvious abnormality, atraumatic Neck: no adenopathy, supple, symmetrical, trachea midline and thyroid normal to inspection and palpation Lungs: clear to auscultation bilaterally Breasts: normal appearance, no masses or tenderness, No nipple retraction or dimpling, No nipple discharge or bleeding, No axillary or supraclavicular adenopathy Heart: regular rate and rhythm Abdomen: soft, non-tender; no masses, no  organomegaly Extremities: extremities normal, atraumatic, no cyanosis or edema Skin: Skin color, texture, turgor normal. No rashes or lesions Lymph nodes: Cervical, supraclavicular, and axillary nodes normal. No abnormal inguinal nodes palpated Neurologic: Grossly normal  Pelvic: External genitalia:  no lesions  Urethra:  normal appearing urethra with no masses, tenderness or lesions              Bartholins and Skenes: normal                 Vagina: normal appearing vagina with normal color and discharge, no lesions              Cervix:  absent              Pap taken: No. Bimanual Exam:  Uterus:  Absent.              Adnexa: no mass, fullness, tenderness              Rectal exam: Yes.  .  Confirms.              Anus:  normal sphincter tone, no lesions  Chaperone was present for exam.  Assessment:   Well woman visit with normal exam. Status post TAH.  Status post RSO and status post LSO. Hx recurrent vaginitis.  Somewhat improved since stopping ERT and Estring.  Menopausal symptoms.  On Zoloft and Neurontin.  IBS. Urinary urgency.   Plan: Mammogram screening. Recommended self breast awareness. Pap and HR HPV as above. Guidelines for Calcium, Vitamin D, regular exercise program including cardiovascular and weight bearing exercise. Urine micro and culture.  She will check in hep C testing with PCP.  We discussed resources and planning for her care of family member with early onset Alzheimer's. Follow up annually and prn.   After visit summary provided.   Addendum  Final UC showing enterococcus.  Will tx with Ampicillin 500 mg po tid x 5 days.

## 2018-03-15 LAB — URINALYSIS, MICROSCOPIC ONLY
Bacteria, UA: NONE SEEN
Casts: NONE SEEN /lpf
RBC, UA: NONE SEEN /hpf (ref 0–2)

## 2018-03-16 ENCOUNTER — Telehealth: Payer: Self-pay | Admitting: Obstetrics and Gynecology

## 2018-03-16 LAB — URINE CULTURE

## 2018-03-16 MED ORDER — AMPICILLIN 500 MG PO CAPS: 500.0000 mg | ORAL_CAPSULE | Freq: Three times a day (TID) | ORAL | 0 refills | Status: DC

## 2018-03-16 NOTE — Telephone Encounter (Signed)
Phone call to patient regarding UC results showing UTI.  I left a message that I am sending a prescription through to her pharmacy for infection.  Please call if not feeling better.

## 2018-03-16 NOTE — Addendum Note (Signed)
Addended by: Yisroel Ramming, Dietrich Pates E on: 03/16/2018 05:33 PM   Modules accepted: Orders

## 2018-03-26 ENCOUNTER — Telehealth: Payer: Self-pay | Admitting: Obstetrics and Gynecology

## 2018-03-26 MED ORDER — FLUCONAZOLE 150 MG PO TABS: 150.0000 mg | ORAL_TABLET | Freq: Once | ORAL | 0 refills | Status: AC

## 2018-03-26 NOTE — Telephone Encounter (Signed)
I agree to the Dilfucan Rx as you outlined.  She will need an office visit if her symptoms persist.

## 2018-03-26 NOTE — Telephone Encounter (Signed)
Spoke with patient. Patient took Ampicillin for a UTI for 5 days. Over the weekend she began having vaginal itching, irritation, and discharge. Requesting medication for a yeast infection. Advised will review with Dr.Silva and return call.  Dr.Silva, okay to send rx for Diflucan 150 mg po x 1, repeat in 72 hours if symptoms persist?

## 2018-03-26 NOTE — Telephone Encounter (Signed)
Spoke with patient. Advised rx for Diflucan has been sent to pharmacy on file. Paitent verbalizes understanding. Encounter closed.

## 2018-03-26 NOTE — Telephone Encounter (Signed)
Patient left voicemail stating that she was seen about a week ago with a bladder infection. Stated that antibiotics are now causing a yeast infection and would like something to be called in.

## 2018-04-16 NOTE — Progress Notes (Signed)
Office Visit Note  Patient: Lauren Montes             Date of Birth: 1956-04-14           MRN: [id number]             PCP: Haywood Pao, MD Referring: Haywood Pao, MD Visit Date: 04/17/2018 Occupation: @GUAROCC @    Subjective:  Neck pain   History of Present Illness: Lauren Montes is a 62 y.o. female with history of fibromyalgia and DDD.  She denies any fibromyalgia flares recently.  She presents today with neck pain and stiffness for the past 3 days.  She denies any injury or changes in activity.  She reports she has been having pain at night.  She states she has been resting and using a heating pad, which has been improving her symptoms.  She has muscle tenderness and tension in the trapezius region.  She denies any radiation of pain or numbness or tingling.  She states that her neck feels strained and fatigue.  She has been taking gabapentin 3 mg at bedtime as well as Zanaflex 4 mg at bedtime.  She states that on a regular basis she has been sleeping well and her fatigue has improved.  She is no longer having symptoms of trochanteric bursitis.  She continues to have chronic lower back pain.  She has been unable to do her normal exercises and yoga due to her neck pain so her lower back has been causing increased discomfort.  She reports yesterday she was having some hand pain but denies any joint swelling.   Activities of Daily Living:  Patient reports morning stiffness for 1-2 hours.   Patient Reports nocturnal pain.  Difficulty dressing/grooming: Reports Difficulty climbing stairs: Denies Difficulty getting out of chair: Denies Difficulty using hands for taps, buttons, cutlery, and/or writing: Denies   Review of Systems  Constitutional: Positive for fatigue. Negative for fever.  HENT: Negative for ear pain, mouth sores, mouth dryness and nose dryness.   Eyes: Negative for pain, visual disturbance and dryness.  Respiratory: Negative for cough, hemoptysis,  shortness of breath and difficulty breathing.   Cardiovascular: Negative for chest pain, palpitations, hypertension and swelling in legs/feet.  Gastrointestinal: Negative for blood in stool, constipation and diarrhea.  Endocrine: Negative for increased urination.  Genitourinary: Negative for difficulty urinating and painful urination.  Musculoskeletal: Positive for myalgias and myalgias. Negative for arthralgias, joint pain, joint swelling, muscle weakness, morning stiffness and muscle tenderness.  Skin: Negative for color change, pallor, rash, hair loss, nodules/bumps, skin tightness, ulcers and sensitivity to sunlight.  Allergic/Immunologic: Negative for susceptible to infections.  Neurological: Negative for dizziness, numbness and headaches.  Hematological: Negative for swollen glands.  Psychiatric/Behavioral: Positive for sleep disturbance. Negative for depressed mood. The patient is not nervous/anxious.     PMFS History:  Patient Active Problem List   Diagnosis Date Noted  . SVT (supraventricular tachycardia) ([place]) 09/26/2016  . HTN (hypertension) 09/24/2015  . Palpitations 03/05/2015  . Fibromyalgia 03/05/2015  . Chronic diastolic CHF (congestive heart failure), NYHA class 2 ([place]) 06/20/2014  . Sinus tachycardia 09/14/2012  . Dysphagia 06/29/2012  . Ocular rosacea   . GERD (gastroesophageal reflux disease)   . Cough 09/09/2011  . Chest tightness, discomfort, or pressure 09/09/2011  . Hyperlipidemia 05/10/2011  . SWELLING OF LIMB 01/20/2011  . GERD 02/03/2010  . ABDOMINAL PAIN-EPIGASTRIC 02/03/2010  . ABDOMINAL PAIN-MULTIPLE SITES 02/03/2010  . CONSTIPATION 09/19/2008  . ABDOMINAL PAIN, LEFT LOWER  QUADRANT 09/19/2008  . COLONIC POLYPS, HYPERPLASTIC, HX OF 09/19/2008    Past Medical History:  Diagnosis Date  . Asthma   . Atrophic vaginitis   . Blood transfusion without reported diagnosis 1975   with vaginal delivery--very anemic  . Cystic teratoma   . Elevated  cholesterol   . Endometriosis   . Esophageal ulcer   . Fibromyalgia   . Fundic gland polyps of stomach, benign   . GERD (gastroesophageal reflux disease)   . History of diastolic dysfunction   . History of migraine headaches   . Leg edema    left leg  . Ocular rosacea   . Osteoarthritis    back,hands, knees  . Overactive bladder   . Raynaud's disease   . Tachycardia   . Tubular adenoma of colon 08/2016    Family History  Problem Relation Age of Onset  . Coronary artery disease Father   . Hypertension Father   . Heart disease Father   . Heart attack Father   . Asthma Mother   . Hypertension Mother   . Alzheimer's disease Maternal Grandmother   . Lung cancer Maternal Grandfather   . Heart attack Maternal Grandfather   . Heart failure Paternal Grandfather   . Heart attack Paternal Grandfather   . Skin cancer Sister        melanoma  . Hepatitis C Brother   . Skin cancer Brother        melanoma  . Pancreatic cancer Maternal Aunt   . Colon cancer Neg Hx   . Colon polyps Neg Hx   . Rectal cancer Neg Hx   . Stomach cancer Neg Hx   . Breast cancer Neg Hx    Past Surgical History:  Procedure Laterality Date  . APPENDECTOMY    . BACK SURGERY    . BREAST SURGERY  10-09-13   breast reduction--Dr. Dessie Coma  . BUNIONECTOMY     bilateral  . CARDIOVASCULAR STRESS TEST  07/02/2008   EF 55-60%  . colonscopy    . DENTAL SURGERY  2007  . ESOPHAGOGASTRODUODENOSCOPY ENDOSCOPY    . FOOT SURGERY     LEFT FOOT  . left breast mass removed    . OOPHORECTOMY  1982   RSO partial and LSO, uterus  . REDUCTION MAMMAPLASTY Bilateral   . RSO  2002   partial  . ruptured disc surgery  07,08  . TOTAL ABDOMINAL HYSTERECTOMY     Social History   Social History Narrative  . Not on file     Objective: Vital Signs: BP 117/71 (BP Location: Left Arm, Patient Position: Sitting, Cuff Size: Normal)   Pulse 75   Ht 5' 5.5" (1.664 m)   Wt 140 lb (63.5 kg)   LMP 11/07/1980   BMI 22.94  kg/m    Physical Exam  Constitutional: She is oriented to person, place, and time. She appears well-developed and well-nourished.  HENT:  Head: Normocephalic and atraumatic.  Eyes: Conjunctivae and EOM are normal.  Neck: Normal range of motion.  Cardiovascular: Normal rate, regular rhythm, normal heart sounds and intact distal pulses.  Pulmonary/Chest: Effort normal and breath sounds normal.  Abdominal: Soft. Bowel sounds are normal.  Lymphadenopathy:    She has no cervical adenopathy.  Neurological: She is alert and oriented to person, place, and time.  Skin: Skin is warm and dry. Capillary refill takes less than 2 seconds.  Psychiatric: She has a normal mood and affect. Her behavior is normal.  Nursing note and  vitals reviewed.    Musculoskeletal Exam: C-spine limited range of motion with discomfort.  Thoracic and lumbar spine good range of motion.  She has tenderness in the trapezius region bilaterally.  She has no midline spinal tenderness or SI joint tenderness.  Shoulder joints, elbow joints, wrist joints, MCPs, PIPs, DIPs good range of motion with no synovitis.  She has mild DIP and PIP synovial thickening consistent with osteoarthritis of bilateral hands.  Hip joints, knee joints, ankle joints, MTPs, PIPs, DIPs good range of motion with no synovitis.  No warmth or effusion of bilateral knee joints.  She has bilateral knee crepitus.  No tenderness of trochanteric bursa bilaterally.  CDAI Exam: No CDAI exam completed.    Investigation: No additional findings. CBC Latest Ref Rng & Units 08/15/2016 10/27/2010 09/19/2008  WBC 4.0 - 10.5 K/uL 7.1 - 6.8  Hemoglobin 12.0 - 15.0 g/dL 13.9 14.4 13.6  Hematocrit 36.0 - 46.0 % 41.4 - 39.9  Platelets 150.0 - 400.0 K/uL 248.0 - 234   CMP Latest Ref Rng & Units 09/26/2017 09/26/2016 08/15/2016  Glucose 65 - 99 mg/dL 79 84 98  BUN 8 - 27 mg/dL 10 10 9   Creatinine 0.57 - 1.00 mg/dL 0.67 0.70 0.73  Sodium 134 - 144 mmol/L 145(H) 142 142    Potassium 3.5 - 5.2 mmol/L 4.2 4.0 4.2  Chloride 96 - 106 mmol/L 104 105 107  CO2 20 - 29 mmol/L 24 28 26   Calcium 8.7 - 10.3 mg/dL 9.8 9.8 9.8  Total Protein 6.0 - 8.5 g/dL 7.1 6.7 7.3  Total Bilirubin 0.0 - 1.2 mg/dL 0.5 0.6 0.3  Alkaline Phos 39 - 117 IU/L 91 83 101  AST 0 - 40 IU/L 27 22 28   ALT 0 - 32 IU/L 23 17 23      Imaging: No results found.  Speciality Comments: No specialty comments available.    Procedures:  No procedures performed Allergies: Codeine; Lipitor [atorvastatin]; and Latex   Assessment / Plan:     Visit Diagnoses: Fibromyalgia: Her fibromyalgia has been very well controlled.  Overall her insomnia and fatigue have also improved.  She has muscle tenderness and muscle tension in the trapezius region bilaterally today.  She is presenting today with neck pain and stiffness for the past 3 days.  An x-ray of her C-spine was obtained today.  She has been taking gabapentin 300 mg at bedtime and Zanaflex 4 mg at bedtime which has been helping with her sleep and muscle tenderness and spasms.  We discussed the importance of regular exercise as well as stretching.  She does yoga on a regular basis.  Primary insomnia: She takes gabapentin 300 mg at bedtime and Zanaflex 4 mg at bedtime which help with her insomnia and muscle spasms.  Overall she has been sleeping a lot better.  Chronic fatigue: Improving.  Neck pain -she presents today with neck pain and stiffness is lasting for the past 3 days.  She has been resting and using a heating pad on her neck which is not improving her symptoms.  She has limited range of motion especially with lateral rotation to the right.  She has trapezius muscle tension and muscle tenderness as well.  An x-ray of her C-spine was obtained today.  She is encouraged to continue to work on neck exercises.  She can also continue using a heating pad.  Plan: XR Cervical Spine 2 or 3 views showed C5-6 and C6-7 narrowing with facet joint  arthropathy.  DDD (degenerative disc  disease), lumbar: Chronic pain.  She perform stretching exercises on a regular basis as well as going to yoga which helps with her lower back pain.  Over the past few days due to her neck pain she has been unable to do some these exercises to the lower back pain has been causing more discomfort.  Raynaud's disease without gangrene: She continues to have symptoms of Raynolds in her hands.  No digital ulcerations or signs of gangrene were noted.  Trochanteric bursitis, unspecified laterality: Resolved.  She has no tenderness on exam today.  Other medical conditions are listed as follows:  History of asthma  History of CHF (congestive heart failure)  History of gastroesophageal reflux (GERD)  Ocular rosacea  History of hypercholesterolemia  History of migraine   Orders: Orders Placed This Encounter  Procedures  . XR Cervical Spine 2 or 3 views   No orders of the defined types were placed in this encounter.  Face-to-face time spent with patient was 30 minutes. >50% of time was spent in counseling and coordination of care.  Follow-Up Instructions: Return in about 6 months (around 10/17/2018) for Fibromyalgia.   Ofilia Neas, PA-C   I examined and evaluated the patient with Hazel Sams PA.  X-ray findings were discussed with patient.  I offered physical therapy which she declined.  She was given a handout on neck exercises.  If her symptoms do not improve then she will contact us.  Exercises for back were also given.  The plan of care was discussed as noted above.  Bo Merino, MD  Note - This record has been created using Editor, commissioning.  Chart creation errors have been sought, but may not always  have been located. Such creation errors do not reflect on  the standard of medical care.

## 2018-04-17 ENCOUNTER — Ambulatory Visit (INDEPENDENT_AMBULATORY_CARE_PROVIDER_SITE_OTHER): Payer: BLUE CROSS/BLUE SHIELD

## 2018-04-17 ENCOUNTER — Ambulatory Visit: Payer: BLUE CROSS/BLUE SHIELD | Admitting: Physician Assistant

## 2018-04-17 ENCOUNTER — Encounter: Payer: Self-pay | Admitting: Physician Assistant

## 2018-04-17 VITALS — BP 117/71 | HR 75 | Ht 65.5 in | Wt 140.0 lb

## 2018-04-17 DIAGNOSIS — M7060 Trochanteric bursitis, unspecified hip: Secondary | ICD-10-CM

## 2018-04-17 DIAGNOSIS — Z8719 Personal history of other diseases of the digestive system: Secondary | ICD-10-CM

## 2018-04-17 DIAGNOSIS — M797 Fibromyalgia: Secondary | ICD-10-CM

## 2018-04-17 DIAGNOSIS — Z8679 Personal history of other diseases of the circulatory system: Secondary | ICD-10-CM

## 2018-04-17 DIAGNOSIS — Z8639 Personal history of other endocrine, nutritional and metabolic disease: Secondary | ICD-10-CM | POA: Diagnosis not present

## 2018-04-17 DIAGNOSIS — M542 Cervicalgia: Secondary | ICD-10-CM | POA: Diagnosis not present

## 2018-04-17 DIAGNOSIS — I7300 Raynaud's syndrome without gangrene: Secondary | ICD-10-CM

## 2018-04-17 DIAGNOSIS — M5136 Other intervertebral disc degeneration, lumbar region: Secondary | ICD-10-CM

## 2018-04-17 DIAGNOSIS — F5101 Primary insomnia: Secondary | ICD-10-CM

## 2018-04-17 DIAGNOSIS — L718 Other rosacea: Secondary | ICD-10-CM | POA: Diagnosis not present

## 2018-04-17 DIAGNOSIS — Z8669 Personal history of other diseases of the nervous system and sense organs: Secondary | ICD-10-CM

## 2018-04-17 DIAGNOSIS — R5382 Chronic fatigue, unspecified: Secondary | ICD-10-CM

## 2018-04-17 DIAGNOSIS — Z8709 Personal history of other diseases of the respiratory system: Secondary | ICD-10-CM | POA: Diagnosis not present

## 2018-04-17 NOTE — Patient Instructions (Addendum)
Neck Exercises Neck exercises can be important for many reasons:  They can help you to improve and maintain flexibility in your neck. This can be especially important as you age.  They can help to make your neck stronger. This can make movement easier.  They can reduce or prevent neck pain.  They may help your upper back.  Ask your health care provider which neck exercises would be best for you. Exercises Neck Press Repeat this exercise 10 times. Do it first thing in the morning and right before bed or as told by your health care provider. 1. Lie on your back on a firm bed or on the floor with a pillow under your head. 2. Use your neck muscles to push your head down on the pillow and straighten your spine. 3. Hold the position as well as you can. Keep your head facing up and your chin tucked. 4. Slowly count to 5 while holding this position. 5. Relax for a few seconds. Then repeat.  Isometric Strengthening Do a full set of these exercises 2 times a day or as told by your health care provider. 1. Sit in a supportive chair and place your hand on your forehead. 2. Push forward with your head and neck while pushing back with your hand. Hold for 10 seconds. 3. Relax. Then repeat the exercise 3 times. 4. Next, do thesequence again, this time putting your hand against the back of your head. Use your head and neck to push backward against the hand pressure. 5. Finally, do the same exercise on either side of your head, pushing sideways against the pressure of your hand.  Prone Head Lifts Repeat this exercise 5 times. Do this 2 times a day or as told by your health care provider. 1. Lie face-down, resting on your elbows so that your chest and upper back are raised. 2. Start with your head facing downward, near your chest. Position your chin either on or near your chest. 3. Slowly lift your head upward. Lift until you are looking straight ahead. Then continue lifting your head as far back as  you can stretch. 4. Hold your head up for 5 seconds. Then slowly lower it to your starting position.  Supine Head Lifts Repeat this exercise 8-10 times. Do this 2 times a day or as told by your health care provider. 1. Lie on your back, bending your knees to point to the ceiling and keeping your feet flat on the floor. 2. Lift your head slowly off the floor, raising your chin toward your chest. 3. Hold for 5 seconds. 4. Relax and repeat.  Scapular Retraction Repeat this exercise 5 times. Do this 2 times a day or as told by your health care provider. 1. Stand with your arms at your sides. Look straight ahead. 2. Slowly pull both shoulders backward and downward until you feel a stretch between your shoulder blades in your upper back. 3. Hold for 10-30 seconds. 4. Relax and repeat.  Contact a health care provider if:  Your neck pain or discomfort gets much worse when you do an exercise.  Your neck pain or discomfort does not improve within 2 hours after you exercise. If you have any of these problems, stop exercising right away. Do not do the exercises again unless your health care provider says that you can. Get help right away if:  You develop sudden, severe neck pain. If this happens, stop exercising right away. Do not do the exercises again unless your   health care provider says that you can. Exercises Neck Stretch  Repeat this exercise 3-5 times. 1. Do this exercise while standing or while sitting in a chair. 2. Place your feet flat on the floor, shoulder-width apart. 3. Slowly turn your head to the right. Turn it all the way to the right so you can look over your right shoulder. Do not tilt or tip your head. 4. Hold this position for 10-30 seconds. 5. Slowly turn your head to the left, to look over your left shoulder. 6. Hold this position for 10-30 seconds.  Neck Retraction Repeat this exercise 8-10 times. Do this 3-4 times a day or as told by your health care  provider. 1. Do this exercise while standing or while sitting in a sturdy chair. 2. Look straight ahead. Do not bend your neck. 3. Use your fingers to push your chin backward. Do not bend your neck for this movement. Continue to face straight ahead. If you are doing the exercise properly, you will feel a slight sensation in your throat and a stretch at the back of your neck. 4. Hold the stretch for 1-2 seconds. Relax and repeat.  This information is not intended to replace advice given to you by your health care provider. Make sure you discuss any questions you have with your health care provider. Document Released: 10/05/2015 Document Revised: 03/31/2016 Document Reviewed: 05/04/2015 Elsevier Interactive Patient Education  2018 [employer].  Back Exercises The following exercises strengthen the muscles that help to support the back. They also help to keep the lower back flexible. Doing these exercises can help to prevent back pain or lessen existing pain. If you have back pain or discomfort, try doing these exercises 2-3 times each day or as told by your health care provider. When the pain goes away, do them once each day, but increase the number of times that you repeat the steps for each exercise (do more repetitions). If you do not have back pain or discomfort, do these exercises once each day or as told by your health care provider. Exercises Single Knee to Chest  Repeat these steps 3-5 times for each leg: 1. Lie on your back on a firm bed or the floor with your legs extended. 2. Bring one knee to your chest. Your other leg should stay extended and in contact with the floor. 3. Hold your knee in place by grabbing your knee or thigh. 4. Pull on your knee until you feel a gentle stretch in your lower back. 5. Hold the stretch for 10-30 seconds. 6. Slowly release and straighten your leg.  Pelvic Tilt  Repeat these steps 5-10 times: 1. Lie on your back on a firm bed or the floor with  your legs extended. 2. Bend your knees so they are pointing toward the ceiling and your feet are flat on the floor. 3. Tighten your lower abdominal muscles to press your lower back against the floor. This motion will tilt your pelvis so your tailbone points up toward the ceiling instead of pointing to your feet or the floor. 4. With gentle tension and even breathing, hold this position for 5-10 seconds.  Cat-Cow  Repeat these steps until your lower back becomes more flexible: 1. Get into a hands-and-knees position on a firm surface. Keep your hands under your shoulders, and keep your knees under your hips. You may place padding under your knees for comfort. 2. Let your head hang down, and point your tailbone toward the floor so  your lower back becomes rounded like the back of a cat. 3. Hold this position for 5 seconds. 4. Slowly lift your head and point your tailbone up toward the ceiling so your back forms a sagging arch like the back of a cow. 5. Hold this position for 5 seconds.  Press-Ups  Repeat these steps 5-10 times: 1. Lie on your abdomen (face-down) on the floor. 2. Place your palms near your head, about shoulder-width apart. 3. While you keep your back as relaxed as possible and keep your hips on the floor, slowly straighten your arms to raise the top half of your body and lift your shoulders. Do not use your back muscles to raise your upper torso. You may adjust the placement of your hands to make yourself more comfortable. 4. Hold this position for 5 seconds while you keep your back relaxed. 5. Slowly return to lying flat on the floor.  Bridges  Repeat these steps 10 times: 1. Lie on your back on a firm surface. 2. Bend your knees so they are pointing toward the ceiling and your feet are flat on the floor. 3. Tighten your buttocks muscles and lift your buttocks off of the floor until your waist is at almost the same height as your knees. You should feel the muscles working in  your buttocks and the back of your thighs. If you do not feel these muscles, slide your feet 1-2 inches farther away from your buttocks. 4. Hold this position for 3-5 seconds. 5. Slowly lower your hips to the starting position, and allow your buttocks muscles to relax completely.  If this exercise is too easy, try doing it with your arms crossed over your chest. Abdominal Crunches  Repeat these steps 5-10 times: 1. Lie on your back on a firm bed or the floor with your legs extended. 2. Bend your knees so they are pointing toward the ceiling and your feet are flat on the floor. 3. Cross your arms over your chest. 4. Tip your chin slightly toward your chest without bending your neck. 5. Tighten your abdominal muscles and slowly raise your trunk (torso) high enough to lift your shoulder blades a tiny bit off of the floor. Avoid raising your torso higher than that, because it can put too much stress on your low back and it does not help to strengthen your abdominal muscles. 6. Slowly return to your starting position.  Back Lifts Repeat these steps 5-10 times: 1. Lie on your abdomen (face-down) with your arms at your sides, and rest your forehead on the floor. 2. Tighten the muscles in your legs and your buttocks. 3. Slowly lift your chest off of the floor while you keep your hips pressed to the floor. Keep the back of your head in line with the curve in your back. Your eyes should be looking at the floor. 4. Hold this position for 3-5 seconds. 5. Slowly return to your starting position.  Contact a health care provider if:  Your back pain or discomfort gets much worse when you do an exercise.  Your back pain or discomfort does not lessen within 2 hours after you exercise. If you have any of these problems, stop doing these exercises right away. Do not do them again unless your health care provider says that you can. Get help right away if:  You develop sudden, severe back pain. If this  happens, stop doing the exercises right away. Do not do them again unless your health care provider  says that you can. This information is not intended to replace advice given to you by your health care provider. Make sure you discuss any questions you have with your health care provider. Document Released: 12/01/2004 Document Revised: 03/02/2016 Document Reviewed: 12/18/2014 Elsevier Interactive Patient Education  2017 [employer].

## 2018-05-04 ENCOUNTER — Ambulatory Visit: Payer: BLUE CROSS/BLUE SHIELD | Admitting: Rheumatology

## 2018-06-04 DIAGNOSIS — H16103 Unspecified superficial keratitis, bilateral: Secondary | ICD-10-CM | POA: Diagnosis not present

## 2018-06-04 DIAGNOSIS — H04123 Dry eye syndrome of bilateral lacrimal glands: Secondary | ICD-10-CM | POA: Diagnosis not present

## 2018-06-04 DIAGNOSIS — H25813 Combined forms of age-related cataract, bilateral: Secondary | ICD-10-CM | POA: Diagnosis not present

## 2018-06-04 DIAGNOSIS — H524 Presbyopia: Secondary | ICD-10-CM | POA: Diagnosis not present

## 2018-06-05 ENCOUNTER — Ambulatory Visit: Payer: BLUE CROSS/BLUE SHIELD | Admitting: Physician Assistant

## 2018-06-05 ENCOUNTER — Other Ambulatory Visit: Payer: Self-pay | Admitting: Rheumatology

## 2018-06-05 NOTE — Telephone Encounter (Signed)
Last Visit: 04/17/18 Next Visit: 10/18/18  Okay to refill per Dr. Estanislado Pandy

## 2018-06-08 ENCOUNTER — Other Ambulatory Visit: Payer: Self-pay | Admitting: Rheumatology

## 2018-06-08 NOTE — Telephone Encounter (Signed)
Last visit: 04/17/2018 Next visit: 10/18/2018  Okay to refill per Dr. Estanislado Pandy.

## 2018-06-24 ENCOUNTER — Encounter (HOSPITAL_COMMUNITY): Payer: Self-pay

## 2018-06-24 ENCOUNTER — Ambulatory Visit (HOSPITAL_COMMUNITY)
Admission: EM | Admit: 2018-06-24 | Discharge: 2018-06-24 | Disposition: A | Discharge status: Home / Self Care | Payer: BLUE CROSS/BLUE SHIELD | Attending: Family Medicine | Admitting: Family Medicine

## 2018-06-24 DIAGNOSIS — N390 Urinary tract infection, site not specified: Secondary | ICD-10-CM

## 2018-06-24 LAB — POCT URINALYSIS DIP (DEVICE)
Bilirubin Urine: NEGATIVE
Glucose, UA: NEGATIVE mg/dL
Hgb urine dipstick: NEGATIVE
Ketones, ur: NEGATIVE mg/dL
NITRITE: NEGATIVE
PROTEIN: NEGATIVE mg/dL
SPECIFIC GRAVITY, URINE: 1.015 (ref 1.005–1.030)
UROBILINOGEN UA: 0.2 mg/dL (ref 0.0–1.0)
pH: 7 (ref 5.0–8.0)

## 2018-06-24 MED ORDER — FLUCONAZOLE 150 MG PO TABS: 150.0000 mg | ORAL_TABLET | Freq: Every day | ORAL | 0 refills | Status: DC

## 2018-06-24 MED ORDER — AMPICILLIN 500 MG PO CAPS: 500.0000 mg | ORAL_CAPSULE | Freq: Four times a day (QID) | ORAL | 0 refills | Status: DC

## 2018-06-24 NOTE — ED Provider Notes (Signed)
[place]    CSN: [id number] Arrival date & time: 06/24/18  1217     History   Chief Complaint Chief Complaint  Patient presents with  . Urinary Tract Infection    HPI Lauren Montes is a 62 y.o. female.   HPI  Patient is here for urinary tract infection.  She had a bladder infection in May.  She usually does not happen very often.  She states that she has urgency to go to the bathroom, and when she gets there she has very little hearing.  She is been going more frequently.  No burning with urination.  No abdominal pain.  No fever.  No nausea.  No history of kidney stones or kidney infection.  She has many years post hysterectomy.  She is not on any hormone replacement.  Past Medical History:  Diagnosis Date  . Asthma   . Atrophic vaginitis   . Blood transfusion without reported diagnosis 1975   with vaginal delivery--very anemic  . Cystic teratoma   . Elevated cholesterol   . Endometriosis   . Esophageal ulcer   . Fibromyalgia   . Fundic gland polyps of stomach, benign   . GERD (gastroesophageal reflux disease)   . History of diastolic dysfunction   . History of migraine headaches   . Leg edema    left leg  . Ocular rosacea   . Osteoarthritis    back,hands, knees  . Overactive bladder   . Raynaud's disease   . Tachycardia   . Tubular adenoma of colon 08/2016    Patient Active Problem List   Diagnosis Date Noted  . SVT (supraventricular tachycardia) ([place]) 09/26/2016  . HTN (hypertension) 09/24/2015  . Palpitations 03/05/2015  . Fibromyalgia 03/05/2015  . Chronic diastolic CHF (congestive heart failure), NYHA class 2 ([place]) 06/20/2014  . Sinus tachycardia 09/14/2012  . Dysphagia 06/29/2012  . Ocular rosacea   . GERD (gastroesophageal reflux disease)   . Cough 09/09/2011  . Chest tightness, discomfort, or pressure 09/09/2011  . Hyperlipidemia 05/10/2011  . SWELLING OF LIMB 01/20/2011  . GERD 02/03/2010  . ABDOMINAL PAIN-EPIGASTRIC 02/03/2010   . ABDOMINAL PAIN-MULTIPLE SITES 02/03/2010  . CONSTIPATION 09/19/2008  . ABDOMINAL PAIN, LEFT LOWER QUADRANT 09/19/2008  . COLONIC POLYPS, HYPERPLASTIC, HX OF 09/19/2008    Past Surgical History:  Procedure Laterality Date  . APPENDECTOMY    . BACK SURGERY    . BREAST SURGERY  10-09-13   breast reduction--Dr. Dessie Coma  . BUNIONECTOMY     bilateral  . CARDIOVASCULAR STRESS TEST  07/02/2008   EF 55-60%  . colonscopy    . DENTAL SURGERY  2007  . ESOPHAGOGASTRODUODENOSCOPY ENDOSCOPY    . FOOT SURGERY     LEFT FOOT  . left breast mass removed    . OOPHORECTOMY  1982   RSO partial and LSO, uterus  . REDUCTION MAMMAPLASTY Bilateral   . RSO  2002   partial  . ruptured disc surgery  07,08  . TOTAL ABDOMINAL HYSTERECTOMY      OB History    Gravida  1   Para  1   Term  1   Preterm      AB      Living  1     SAB      TAB      Ectopic      Multiple      Live Births  Home Medications    Prior to Admission medications   Medication Sig Start Date End Date Taking? Authorizing Provider  ampicillin (PRINCIPEN) 500 MG capsule Take 1 capsule (500 mg total) by mouth 4 (four) times daily. 06/24/18   Raylene Everts, MD  cetirizine (ZYRTEC) 10 MG tablet Take 10 mg by mouth daily.    [provider]  cycloSPORINE (RESTASIS) 0.05 % ophthalmic emulsion Place 1 drop into both eyes 2 (two) times daily.    [provider]  dicyclomine (BENTYL) 10 MG capsule TAKE 1 CAPSULE BY MOUTH 4 TIMES DAILY - BEFORE MEALS AND AT BEDTIME. 12/12/17   Ladene Artist, MD  diltiazem (CARDIZEM CD) 120 MG 24 hr capsule Take 1 capsule (120 mg total) by mouth daily. 09/26/17   Nahser, Wonda Cheng, MD  DULERA 200-5 MCG/ACT AERO Inhale 2 puffs into the lungs 2 (two) times daily. 05/27/17   [provider]  fluconazole (DIFLUCAN) 150 MG tablet Take 1 tablet (150 mg total) by mouth daily. Repeat in 1 week if needed 06/24/18   Raylene Everts, MD  gabapentin  (NEURONTIN) 300 MG capsule TAKE 1 CAPSULE BY MOUTH EVERYDAY AT BEDTIME 06/05/18   Bo Merino, MD  montelukast (SINGULAIR) 10 MG tablet Take 10 mg by mouth at bedtime.      [provider]  Omega-3 Fatty Acids (OMEGA 3 PO) Take 1 capsule by mouth daily.      [provider]  PROAIR HFA 108 (90 BASE) MCG/ACT inhaler Inhale 2 puffs into the lungs every 6 (six) hours as needed for wheezing or shortness of breath.  08/07/15   [provider]  rosuvastatin (CRESTOR) 5 MG tablet TAKE 1 TABLET (5 MG TOTAL) BY MOUTH DAILY AT 6 PM. 11/27/17   Nahser, Wonda Cheng, MD  sertraline (ZOLOFT) 50 MG tablet 1/2 TABLET ONCE DAILY 06/28/16   [provider]  tiZANidine (ZANAFLEX) 4 MG tablet TAKE ONE AT BEDTIME AS NEEDED 06/08/18   Bo Merino, MD    Family History Family History  Problem Relation Age of Onset  . Coronary artery disease Father   . Hypertension Father   . Heart disease Father   . Heart attack Father   . Asthma Mother   . Hypertension Mother   . Alzheimer's disease Maternal Grandmother   . Lung cancer Maternal Grandfather   . Heart attack Maternal Grandfather   . Heart failure Paternal Grandfather   . Heart attack Paternal Grandfather   . Skin cancer Sister        melanoma  . Hepatitis C Brother   . Skin cancer Brother        melanoma  . Pancreatic cancer Maternal Aunt   . Colon cancer Neg Hx   . Colon polyps Neg Hx   . Rectal cancer Neg Hx   . Stomach cancer Neg Hx   . Breast cancer Neg Hx     Social History Social History   Tobacco Use  . Smoking status: Never Smoker  . Smokeless tobacco: Never Used  Substance Use Topics  . Alcohol use: Yes  . Drug use: No     Allergies   Codeine; Lipitor [atorvastatin]; and Latex   Review of Systems Review of Systems  Constitutional: Negative for chills and fever.  HENT: Negative for ear pain and sore throat.   Eyes: Negative for pain and visual disturbance.  Respiratory: Negative for  cough and shortness of breath.   Cardiovascular: Negative for chest pain and palpitations.  Gastrointestinal: Negative for abdominal pain and vomiting.  Genitourinary: Positive for dysuria and frequency. Negative for flank pain, hematuria, vaginal bleeding and vaginal discharge.  Musculoskeletal: Negative for arthralgias and back pain.  Skin: Negative for color change and rash.  Neurological: Negative for seizures and syncope.  All other systems reviewed and are negative.    Physical Exam Triage Vital Signs ED Triage Vitals  Enc Vitals Group     BP 06/24/18 1317 (!) 113/58     Pulse Rate 06/24/18 1317 79     Resp 06/24/18 1317 20     Temp 06/24/18 1317 97.9 F (36.6 C)     Temp Source 06/24/18 1317 Oral     SpO2 06/24/18 1317 100 %     Weight --      Height --      Head Circumference --      Peak Flow --      Pain Score 06/24/18 1316 3     Pain Loc --      Pain Edu? --      Excl. in [place]? --    No data found.  Updated Vital Signs BP (!) 113/58 (BP Location: Left Arm)   Pulse 79   Temp 97.9 F (36.6 C) (Oral)   Resp 20   LMP 11/07/1980   SpO2 100%   Visual Acuity Right Eye Distance:   Left Eye Distance:   Bilateral Distance:    Right Eye Near:   Left Eye Near:    Bilateral Near:     Physical Exam  Constitutional: She appears well-developed and well-nourished. No distress.  HENT:  Head: Normocephalic and atraumatic.  Mouth/Throat: Oropharynx is clear and moist.  Eyes: Pupils are equal, round, and reactive to light. Conjunctivae are normal.  Neck: Normal range of motion.  Cardiovascular: Normal rate, regular rhythm and normal heart sounds.  Pulmonary/Chest: Effort normal and breath sounds normal. No respiratory distress.  No CVA tenderness  Abdominal: Soft. Bowel sounds are normal. She exhibits no distension.  No abdominal tenderness  Musculoskeletal: Normal range of motion. She exhibits no edema.  Neurological: She is alert.  Skin: Skin is warm and dry.      UC Treatments / Results  Labs (all labs ordered are listed, but only abnormal results are displayed) Labs Reviewed  POCT URINALYSIS DIP (DEVICE) - Abnormal; Notable for the following components:      Result Value   Leukocytes, UA TRACE (*)    All other components within normal limits    EKG None  Radiology No results found.  Procedures Procedures (including critical care time)  Medications Ordered in UC Medications - No data to display  Initial Impression / Assessment and Plan / UC Course  I have reviewed the triage vital signs and the nursing notes.  Pertinent labs & imaging results that were available during my care of the patient were reviewed by me and considered in my medical decision making (see chart for details).     Discussed urinary tract infection.  Discussed atrophic vaginitis.  Discussed follow-up with family doctor or GYN if has recurring urinary problems. Final Clinical Impressions(s) / UC Diagnoses   Final diagnoses:  Lower urinary tract infectious disease     Discharge Instructions     Push fluids Take the antibiotic as directed Use diflucan as needed Follow up with your PCP    ED Prescriptions    Medication Sig Dispense Auth. Provider   ampicillin (PRINCIPEN) 500 MG capsule Take 1 capsule (500  mg total) by mouth 4 (four) times daily. 20 capsule Raylene Everts, MD   fluconazole (DIFLUCAN) 150 MG tablet Take 1 tablet (150 mg total) by mouth daily. Repeat in 1 week if needed 2 tablet Raylene Everts, MD     Controlled Substance Prescriptions [place] Controlled Substance Registry consulted? Not Applicable   Raylene Everts, MD 06/24/18 1353

## 2018-06-24 NOTE — Discharge Instructions (Signed)
Push fluids Take the antibiotic as directed Use diflucan as needed Follow up with your PCP

## 2018-06-24 NOTE — ED Triage Notes (Signed)
Pt presents with urinary tract symptoms; urge to urinate and lower back pain.

## 2018-07-04 DIAGNOSIS — E7800 Pure hypercholesterolemia, unspecified: Secondary | ICD-10-CM | POA: Diagnosis not present

## 2018-07-04 DIAGNOSIS — K589 Irritable bowel syndrome without diarrhea: Secondary | ICD-10-CM | POA: Diagnosis not present

## 2018-07-04 DIAGNOSIS — M5416 Radiculopathy, lumbar region: Secondary | ICD-10-CM | POA: Diagnosis not present

## 2018-07-04 DIAGNOSIS — Z23 Encounter for immunization: Secondary | ICD-10-CM | POA: Diagnosis not present

## 2018-07-04 DIAGNOSIS — M797 Fibromyalgia: Secondary | ICD-10-CM | POA: Diagnosis not present

## 2018-07-30 ENCOUNTER — Other Ambulatory Visit: Payer: Self-pay | Admitting: Obstetrics and Gynecology

## 2018-07-30 DIAGNOSIS — Z1231 Encounter for screening mammogram for malignant neoplasm of breast: Secondary | ICD-10-CM

## 2018-08-24 DIAGNOSIS — D485 Neoplasm of uncertain behavior of skin: Secondary | ICD-10-CM | POA: Diagnosis not present

## 2018-08-24 DIAGNOSIS — D2271 Melanocytic nevi of right lower limb, including hip: Secondary | ICD-10-CM | POA: Diagnosis not present

## 2018-08-24 DIAGNOSIS — L821 Other seborrheic keratosis: Secondary | ICD-10-CM | POA: Diagnosis not present

## 2018-08-24 DIAGNOSIS — D2261 Melanocytic nevi of right upper limb, including shoulder: Secondary | ICD-10-CM | POA: Diagnosis not present

## 2018-08-24 DIAGNOSIS — L814 Other melanin hyperpigmentation: Secondary | ICD-10-CM | POA: Diagnosis not present

## 2018-09-01 ENCOUNTER — Other Ambulatory Visit: Payer: Self-pay | Admitting: Rheumatology

## 2018-09-03 NOTE — Telephone Encounter (Signed)
Last visit: 04/17/2018 Next visit: 10/18/2018  Okay to refill per Dr. Estanislado Pandy

## 2018-09-06 ENCOUNTER — Ambulatory Visit
Admission: RE | Admit: 2018-09-06 | Discharge: 2018-09-06 | Disposition: A | Discharge status: Home / Self Care | Payer: BLUE CROSS/BLUE SHIELD | Source: Ambulatory Visit | Attending: Obstetrics and Gynecology | Admitting: Obstetrics and Gynecology

## 2018-09-06 DIAGNOSIS — J45901 Unspecified asthma with (acute) exacerbation: Secondary | ICD-10-CM | POA: Diagnosis not present

## 2018-09-06 DIAGNOSIS — R5381 Other malaise: Secondary | ICD-10-CM | POA: Diagnosis not present

## 2018-09-06 DIAGNOSIS — Z1231 Encounter for screening mammogram for malignant neoplasm of breast: Secondary | ICD-10-CM

## 2018-09-06 DIAGNOSIS — Z6822 Body mass index (BMI) 22.0-22.9, adult: Secondary | ICD-10-CM | POA: Diagnosis not present

## 2018-09-06 DIAGNOSIS — K589 Irritable bowel syndrome without diarrhea: Secondary | ICD-10-CM | POA: Diagnosis not present

## 2018-09-15 ENCOUNTER — Other Ambulatory Visit: Payer: Self-pay | Admitting: Cardiovascular Disease

## 2018-09-26 ENCOUNTER — Encounter: Payer: Self-pay | Admitting: Cardiovascular Disease

## 2018-09-26 ENCOUNTER — Ambulatory Visit: Payer: BLUE CROSS/BLUE SHIELD | Admitting: Cardiovascular Disease

## 2018-09-26 VITALS — BP 102/60 | HR 79 | Ht 65.5 in | Wt 135.1 lb

## 2018-09-26 DIAGNOSIS — E782 Mixed hyperlipidemia: Secondary | ICD-10-CM | POA: Diagnosis not present

## 2018-09-26 DIAGNOSIS — I471 Supraventricular tachycardia: Secondary | ICD-10-CM

## 2018-09-26 MED ORDER — DILTIAZEM HCL ER COATED BEADS 120 MG PO CP24: ORAL_CAPSULE | ORAL | 3 refills | Status: DC

## 2018-09-26 NOTE — Progress Notes (Signed)
Lauren Montes Date of Birth  June 07, 1956 [facility] / [facility] [address] .[place], [place]  [address] [phone]  Fax  [phone]  1. Hyperlipidemia 2. Sinus tachycardia, SVT 3. Chronic asthma     Lauren Montes is a middle-age female the history of mild diastolic dysfunction as well as leg edema. She also has a history of hypercholesterolemia.   Has a history of hyperlipidemia. We have had her on Crestor the past which she tolerated very well. Her insurance and he refused to pay for to try her on atorvastatin. This caused her to have lots of muscle aches as well as memory loss. We discontinued the atorvastatin at that time.  She has been recording her heart rate and blood pressure readings. Her heart rate is frequently above 100. He also has many readings in the 80-90 range.  Has bruising particularly on her hands and on her fingers. [name] never had any easy bleeding. She did not have any bleeding  complications. during her pregnancies or deliveries.  Feb. 4, 2014: Lauren Montes is doing well.  Her HR and BP have been well controlled.   June 17, 2013:  Lauren Montes is doing well.  She has started waking over the past several weeks.    June 21, 2014:  Lauren Montes is having some issues with DOE.    Perhaps slight chest pressure with exertion.  No pain. .  Sleeps on 2 pillows occasionally at night - not every night.  [name] very careful with her salt intake. She and her husband thinks most of her meals of scratch. She denies any leg swelling   Nov. 17, 2015:  Lauren Montes is doing well.  She is seen for chronic dyspnea. Her echo was normal.  myoview showed no ischemia Occasional heart racing.  Not severe.  Does not take any meds.  Resolves after several minutes. She is walking for exercise - 30-45 minutes 3 times a week.  Also do some strength training  Nov. 17, 2016: Overall doing ok Lots of stress - aging parents  Lots of muscle cramps in legs and  feet.   Feb. 27, 2017:  Lauren Montes is seen back  Cholesterol is back up - she was not able to takes atorvastatin or Pravachol because of severe muscle aches and cramping  And mental foggyness. She has  Tried  Crestor in the past and it caused muscle cramps as well . [name] now on Zetia.  May 18 , 2017:  Lauren Montes is seen today for a work in visit.  She has been having lots of swelling in her feet and hands ( feet > hands)  We gave her Lasix and Kdur to take for several days.  She took the Lasix and Kdur for 2 days .   Has not needed it sense  has had some fatigue  Has not really eaten any more salt.  This has been occuring off and of for the past 6 weeks.  Is having palpitations at night  She has had increased anxiety.  Still able to do all of her usual activities without dyspnea.   She recently stopped taking her low dose estrogen ( 2-3 weeks ago )   Nov. 20, 2017:     No issues,  No SVT Takes 1/2 zoloft a day - anxiety may have been plaing a part  Has really cut down on the cholesterol in her diet  Getting exercise. Has lost 20 lbs.  Would like to  decrease the crestor to every other day instead of daily .  Nov. 20, 2018  Doing great No significant  Tachycardia Has greatly reduced her meat intake.   Has a seafood allergy Eats some chicken.    Lots of beans .     Nov. 20, 2019  Lauren Montes is seen today  Doing very well  BP and HR are well controlled.  Has occasional episodes of tachycardia , esp at night or if she gets overexerted. Has not been exercising  Seems to occur daily  Always when she lies down at night   Some of her symptoms sound like volume depletion She drinks water all day long but then says that she has heart pounding frequently through the day.  She is having issues with her IBS .  Has not been eating much protein   Current Outpatient Medications  Medication Sig Dispense Refill  . cycloSPORINE (RESTASIS) 0.05 % ophthalmic emulsion Place 1 drop into both  eyes 2 (two) times daily.    Marland Kitchen diltiazem (CARDIZEM CD) 120 MG 24 hr capsule TAKE 1 CAPSULE BY MOUTH EVERY DAY 90 capsule 0  . DULERA 200-5 MCG/ACT AERO Inhale 2 puffs into the lungs 2 (two) times daily.  5  . fexofenadine (ALLEGRA) 60 MG tablet Take 60 mg by mouth daily.    . fluconazole (DIFLUCAN) 150 MG tablet Take 1 tablet (150 mg total) by mouth daily. Repeat in 1 week if needed 2 tablet 0  . gabapentin (NEURONTIN) 300 MG capsule TAKE 1 CAPSULE BY MOUTH EVERYDAY AT BEDTIME 90 capsule 1  . montelukast (SINGULAIR) 10 MG tablet Take 10 mg by mouth at bedtime.      . Multiple Vitamin (MULTIVITAMIN) tablet Take 1 tablet by mouth daily.    . Omega-3 Fatty Acids (OMEGA 3 PO) Take 1 capsule by mouth daily.      Marland Kitchen PROAIR HFA 108 (90 BASE) MCG/ACT inhaler Inhale 2 puffs into the lungs every 6 (six) hours as needed for wheezing or shortness of breath.   11  . rosuvastatin (CRESTOR) 5 MG tablet TAKE 1 TABLET (5 MG TOTAL) BY MOUTH DAILY AT 6 PM. 90 tablet 3  . sertraline (ZOLOFT) 50 MG tablet 1/2 TABLET ONCE DAILY  2  . tiZANidine (ZANAFLEX) 4 MG tablet TAKE ONE AT BEDTIME AS NEEDED 90 tablet 0   Current Facility-Administered Medications  Medication Dose Route Frequency Provider Last Rate Last Dose  . 0.9 %  sodium chloride infusion  500 mL Intravenous Continuous Ladene Artist, MD         Allergies  Allergen Reactions  . Codeine Nausea And Vomiting    Migraine headache  . Lipitor [Atorvastatin] Other (See Comments)    Muscle ache, mind foggy,   . Latex Hives and Rash    Past Medical History:  Diagnosis Date  . Asthma   . Atrophic vaginitis   . Blood transfusion without reported diagnosis 1975   with vaginal delivery--very anemic  . Cystic teratoma   . Elevated cholesterol   . Endometriosis   . Esophageal ulcer   . Fibromyalgia   . Fundic gland polyps of stomach, benign   . GERD (gastroesophageal reflux disease)   . History of diastolic dysfunction   . History of migraine  headaches   . Leg edema    left leg  . Ocular rosacea   . Osteoarthritis    back,hands, knees  . Overactive bladder   . Raynaud's disease   . Tachycardia   .  Tubular adenoma of colon 08/2016    Past Surgical History:  Procedure Laterality Date  . APPENDECTOMY    . BACK SURGERY    . BREAST EXCISIONAL BIOPSY Left   . BREAST SURGERY  10-09-13   breast reduction--Dr. Dessie Coma  . BUNIONECTOMY     bilateral  . CARDIOVASCULAR STRESS TEST  07/02/2008   EF 55-60%  . colonscopy    . DENTAL SURGERY  2007  . ESOPHAGOGASTRODUODENOSCOPY ENDOSCOPY    . FOOT SURGERY     LEFT FOOT  . left breast mass removed    . OOPHORECTOMY  1982   RSO partial and LSO, uterus  . REDUCTION MAMMAPLASTY Bilateral   . RSO  2002   partial  . ruptured disc surgery  07,08  . TOTAL ABDOMINAL HYSTERECTOMY      Social History   Tobacco Use  Smoking Status Never Smoker  Smokeless Tobacco Never Used    Social History   Substance and Sexual Activity  Alcohol Use Yes    Family History  Problem Relation Age of Onset  . Coronary artery disease Father   . Hypertension Father   . Heart disease Father   . Heart attack Father   . Asthma Mother   . Hypertension Mother   . Alzheimer's disease Maternal Grandmother   . Lung cancer Maternal Grandfather   . Heart attack Maternal Grandfather   . Heart failure Paternal Grandfather   . Heart attack Paternal Grandfather   . Skin cancer Sister        melanoma  . Hepatitis C Brother   . Skin cancer Brother        melanoma  . Pancreatic cancer Maternal Aunt   . Colon cancer Neg Hx   . Colon polyps Neg Hx   . Rectal cancer Neg Hx   . Stomach cancer Neg Hx   . Breast cancer Neg Hx     Reviw of Systems:  Reviewed in the HPI.  All other systems are negative.  Physical Exam: Blood pressure 102/60, pulse 79, height 5' 5.5" (1.664 m), weight 135 lb 1.9 oz (61.3 kg), last menstrual period 11/07/1980, SpO2 96 %.  GEN:  Well nourished, well developed in no  acute distress HEENT: Normal NECK: No JVD; No carotid bruits LYMPHATICS: No lymphadenopathy CARDIAC: RRR , no murmurs, rubs, gallops RESPIRATORY:  Clear to auscultation without rales, wheezing or rhonchi  ABDOMEN: Soft, non-tender, non-distended MUSCULOSKELETAL:  No edema; No deformity  SKIN: Warm and dry NEUROLOGIC:  Alert and oriented x 3    ECG:    September 26, 2018: Normal sinus rhythm with sinus arrhythmia.  Nonspecific ST abnormalities.  Assessment / Plan:   1.chronic diastolic CHF:    Seems to be well well controlled.   2. Essential hypertension:   BP is well controlled.    3. Hyperlipidemia:    Labs have been stable Check labs in 6 months at her next appt     4. Supraventricular  tachycardia: Stable. On Diltiazem 120 mg a day  Her symptoms actually sound more like volume depletion rather than true SVT at this point.  I have encouraged her to continue drinking lots of water.  She is to drink a can of V8 every day and also to increase her protein.  She has not been eating much protein because of her IBS.     Mertie Moores, MD  09/26/2018 1:36 PM    [address],  [address]  [place], [place]  [address] Pager [phone] Phone: [phone]; Fax: [phone]

## 2018-09-26 NOTE — Patient Instructions (Addendum)

## 2018-10-08 NOTE — Progress Notes (Signed)
Office Visit Note  Patient: Lauren Montes             Date of Birth: [birth year]           MRN: [id number]             PCP: Haywood Pao, MD Referring: Haywood Pao, MD Visit Date: 10/18/2018 Occupation: @GUAROCC @  Subjective:  Left trochanteric bursitis   History of Present Illness: Lauren Montes is a 62 y.o. female with history of fibromyalgia and DDD.  She is on Gabapentin 300 mg po at bedtime and Zanaflex 4 mg by mouth at bedtime PRN for muscle spasms.  She is concerned that Zanaflex will raise her risk of dementia.  She states her younger brother recently diagnosed with early onset Alzheimer's dementia and she is concerned that Zanaflex will increase her risk.  She states that she has been taking 1/2 tablet at bedtime trying to taper off and would like to discuss other options.  She continues to take gabapentin 300 mg 1 tablet by mouth at bedtime.  She states that she sleeps 5 to 7 hours most nights.  She states that her fatigue overall has been stable lately.  She reports that her muscle aches and muscle tenderness have been tolerable recently.  She states that she does have left trochanteric bursitis and tries to perform stretching exercises at home.  She has been walking on a regular basis for exercise and goes to the gym occasionally.  She states that she has been having increased achiness and stiffness in bilateral hands and reports that she has some swelling intermittently.  She states that her sister has a history of rheumatoid arthritis.  She denies any other joint pain or joint swelling at this time.  She continues to get symptoms of Raynaud's in her fingers intermittently but denies any digital ulcerations.   Activities of Daily Living:  Patient reports morning stiffness for 1-2  hours.   Patient Reports nocturnal pain.  Difficulty dressing/grooming: Denies Difficulty climbing stairs: Denies Difficulty getting out of chair: Denies Difficulty using hands for  taps, buttons, cutlery, and/or writing: Denies  Review of Systems  Constitutional: Positive for fatigue.  HENT: Negative for mouth sores, mouth dryness and nose dryness.   Eyes: Positive for dryness (Restasis). Negative for pain and visual disturbance.  Respiratory: Negative for cough, hemoptysis, shortness of breath and difficulty breathing.   Cardiovascular: Negative for chest pain, palpitations, hypertension and swelling in legs/feet.  Gastrointestinal: Positive for constipation (HX of IBS) and diarrhea. Negative for blood in stool.  Endocrine: Negative for increased urination.  Genitourinary: Negative for painful urination.  Musculoskeletal: Positive for arthralgias, joint pain, joint swelling, morning stiffness and muscle tenderness. Negative for myalgias, muscle weakness and myalgias.  Skin: Positive for color change. Negative for pallor, rash, hair loss, nodules/bumps, skin tightness, ulcers and sensitivity to sunlight.  Allergic/Immunologic: Negative for susceptible to infections.  Neurological: Negative for dizziness, numbness, headaches and weakness.  Hematological: Negative for swollen glands.  Psychiatric/Behavioral: Positive for sleep disturbance. Negative for depressed mood. The patient is nervous/anxious.     PMFS History:  Patient Active Problem List   Diagnosis Date Noted  . SVT (supraventricular tachycardia) ([place]) 09/26/2016  . HTN (hypertension) 09/24/2015  . Palpitations 03/05/2015  . Fibromyalgia 03/05/2015  . Chronic diastolic CHF (congestive heart failure), NYHA class 2 ([place]) 06/20/2014  . Sinus tachycardia 09/14/2012  . Dysphagia 06/29/2012  . Ocular rosacea   . GERD (gastroesophageal reflux disease)   .  Cough 09/09/2011  . Chest tightness, discomfort, or pressure 09/09/2011  . Hyperlipidemia 05/10/2011  . SWELLING OF LIMB 01/20/2011  . GERD 02/03/2010  . ABDOMINAL PAIN-EPIGASTRIC 02/03/2010  . ABDOMINAL PAIN-MULTIPLE SITES 02/03/2010  . CONSTIPATION  09/19/2008  . ABDOMINAL PAIN, LEFT LOWER QUADRANT 09/19/2008  . COLONIC POLYPS, HYPERPLASTIC, HX OF 09/19/2008    Past Medical History:  Diagnosis Date  . Asthma   . Atrophic vaginitis   . Blood transfusion without reported diagnosis 1975   with vaginal delivery--very anemic  . Cystic teratoma   . Elevated cholesterol   . Endometriosis   . Esophageal ulcer   . Fibromyalgia   . Fundic gland polyps of stomach, benign   . GERD (gastroesophageal reflux disease)   . History of diastolic dysfunction   . History of migraine headaches   . Leg edema    left leg  . Ocular rosacea   . Osteoarthritis    back,hands, knees  . Overactive bladder   . Raynaud's disease   . Tachycardia   . Tubular adenoma of colon 08/2016    Family History  Problem Relation Age of Onset  . Coronary artery disease Father   . Hypertension Father   . Heart disease Father   . Heart attack Father   . Asthma Mother   . Hypertension Mother   . Alzheimer's disease Maternal Grandmother   . Lung cancer Maternal Grandfather   . Heart attack Maternal Grandfather   . Heart failure Paternal Grandfather   . Heart attack Paternal Grandfather   . Skin cancer Sister        melanoma  . Hepatitis C Brother   . Skin cancer Brother        melanoma  . Pancreatic cancer Maternal Aunt   . Colon cancer Neg Hx   . Colon polyps Neg Hx   . Rectal cancer Neg Hx   . Stomach cancer Neg Hx   . Breast cancer Neg Hx    Past Surgical History:  Procedure Laterality Date  . APPENDECTOMY    . BACK SURGERY    . BREAST EXCISIONAL BIOPSY Left   . BREAST SURGERY  10-09-13   breast reduction--Dr. Dessie Coma  . BUNIONECTOMY     bilateral  . CARDIOVASCULAR STRESS TEST  07/02/2008   EF 55-60%  . colonscopy    . DENTAL SURGERY  2007  . ESOPHAGOGASTRODUODENOSCOPY ENDOSCOPY    . FOOT SURGERY     LEFT FOOT  . left breast mass removed    . OOPHORECTOMY  1982   RSO partial and LSO, uterus  . REDUCTION MAMMAPLASTY Bilateral   . RSO   2002   partial  . ruptured disc surgery  07,08  . TOTAL ABDOMINAL HYSTERECTOMY     Social History   Social History Narrative  . Not on file    Objective: Vital Signs: BP 122/66 (BP Location: Left Arm, Patient Position: Sitting, Cuff Size: Small)   Pulse 91   Resp 12   Ht 5' 5.5" (1.664 m)   Wt 141 lb 6.4 oz (64.1 kg)   LMP 11/07/1980   BMI 23.17 kg/m    Physical Exam Vitals signs and nursing note reviewed.  Constitutional:      Appearance: She is well-developed.  HENT:     Head: Normocephalic and atraumatic.  Eyes:     Conjunctiva/sclera: Conjunctivae normal.  Neck:     Musculoskeletal: Normal range of motion.  Cardiovascular:     Rate and Rhythm: Normal rate and  regular rhythm.     Heart sounds: Normal heart sounds.  Pulmonary:     Effort: Pulmonary effort is normal.     Breath sounds: Normal breath sounds.  Abdominal:     General: Bowel sounds are normal.     Palpations: Abdomen is soft.  Lymphadenopathy:     Cervical: No cervical adenopathy.  Skin:    General: Skin is warm and dry.     Capillary Refill: Capillary refill takes less than 2 seconds.  Neurological:     Mental Status: She is alert and oriented to person, place, and time.  Psychiatric:        Behavior: Behavior normal.      Musculoskeletal Exam: C-spine, thoracic spine, lumbar spine good range of motion.  No midline spinal tenderness.  No SI joint tenderness.  Shoulder joints, elbow joints, wrist joints, MCPs, PIPs, DIPs good range of motion with no synovitis.  She has complete fist formation bilaterally.  She has mild PIP and DIP synovial thickening consistent with osteoarthritis of bilateral hands.  Hip joints, knee joints, ankle joints, MTPs, PIPs, DIPs good range of motion with no synovitis.  No warmth or effusion of bilateral knee joints.  No tenderness or swelling of ankle joints.  She has tenderness over the left trochanteric bursa on exam.  CDAI Exam: CDAI Score: Not documented Patient  Global Assessment: Not documented; Provider Global Assessment: Not documented Swollen: Not documented; Tender: Not documented Joint Exam   Not documented   There is currently no information documented on the homunculus. Go to the Rheumatology activity and complete the homunculus joint exam.  Investigation: No additional findings.  Imaging: No results found.  Recent Labs: Lab Results  Component Value Date   WBC 7.1 08/15/2016   HGB 13.9 08/15/2016   PLT 248.0 08/15/2016   NA 145 (H) 09/26/2017   K 4.2 09/26/2017   CL 104 09/26/2017   CO2 24 09/26/2017   GLUCOSE 79 09/26/2017   BUN 10 09/26/2017   CREATININE 0.67 09/26/2017   BILITOT 0.5 09/26/2017   ALKPHOS 91 09/26/2017   AST 27 09/26/2017   ALT 23 09/26/2017   PROT 7.1 09/26/2017   ALBUMIN 4.8 09/26/2017   CALCIUM 9.8 09/26/2017   GFRAA 110 09/26/2017    Speciality Comments: No specialty comments available.  Procedures:  Large Joint Inj: L greater trochanter on 10/18/2018 1:27 PM Indications: pain Details: 27 G 1.5 in needle, lateral approach  Arthrogram: No  Medications: 1 mL lidocaine 1 %; 40 mg triamcinolone acetonide 40 MG/ML Aspirate: 0 mL Outcome: tolerated well, no immediate complications Procedure, treatment alternatives, risks and benefits explained, specific risks discussed. Consent was given by the patient. Immediately prior to procedure a time out was called to verify the correct patient, procedure, equipment, support staff and site/side marked as required. Patient was prepped and draped in the usual sterile fashion.     Allergies: Codeine; Lipitor [atorvastatin]; and Latex   Assessment / Plan:     Visit Diagnoses: Fibromyalgia: She reports her muscle aches and muscle tenderness have been tolerable. She has left trochanteric bursitis, and requested a left trochanteric bursa cortisone injection today.  She was given a handout of exercises to perform at home.  She has been walking for exercise and  goes to the gym occasionally.  She was encouraged to stay active and exercise on a regular basis.  She is concerned about the anticholinergic effects of Zanaflex and has been taking half tablet of Zanaflex at bedtime for muscle  spasms.  She is going to reduce her dose of gabapentin to 200 mg by mouth at bedtime.  She does not need any refills of medications at this time.  She will follow-up in the office in 6 months.  Primary insomnia: She has been taking gabapentin 300 mg by mouth at bedtime.  We discussed reducing the dose to 200 mg by mouth at bedtime.  She will take Zanaflex 4 mg by mouth at bedtime PRN for muscle spasms.  Chronic fatigue: Stable.  She remains very active and exercise on a regular basis.  She goes to the gym occasionally.  DDD (degenerative disc disease), lumbar: She is good range of motion with no discomfort.  She has no midline spinal tenderness at this time.  Raynaud's disease without gangrene: She continues to have intermittent symptoms of Raynaud's.  She has no digital ulcerations or signs of gangrene on exam.  She was encouraged to wear gloves and keep her core body temperature warm.  Trochanteric bursitis of left hip: She has tenderness of the left trochanteric bursa on exam.  She is a side sleeper and experiences discomfort at night.  She perform stretching exercises on a regular basis at home.  She requested a left trochanteric bursa cortisone injection today in the office.  She tolerated procedure well.  The procedure note is completed above.  She was given a handout of exercises that she can perform at home.  Other medical conditions are listed as follows:  History of asthma  History of CHF (congestive heart failure)  History of gastroesophageal reflux (GERD)  Ocular rosacea  History of hypercholesterolemia  History of migraine   Orders: Orders Placed This Encounter  Procedures  . Large Joint Inj   No orders of the defined types were placed in this  encounter.   Face-to-face time spent with patient was 30 minutes. Greater than 50% of time was spent in counseling and coordination of care.  Follow-Up Instructions: Return in about 6 months (around 04/19/2019) for Fibromyalgia, DDD.   Ofilia Neas, PA-C  Note - This record has been created using Dragon software.  Chart creation errors have been sought, but may not always  have been located. Such creation errors do not reflect on  the standard of medical care.

## 2018-10-18 ENCOUNTER — Ambulatory Visit (INDEPENDENT_AMBULATORY_CARE_PROVIDER_SITE_OTHER): Payer: BLUE CROSS/BLUE SHIELD | Admitting: Physician Assistant

## 2018-10-18 ENCOUNTER — Encounter: Payer: Self-pay | Admitting: Physician Assistant

## 2018-10-18 VITALS — BP 122/66 | HR 91 | Resp 12 | Ht 65.5 in | Wt 141.4 lb

## 2018-10-18 DIAGNOSIS — L718 Other rosacea: Secondary | ICD-10-CM

## 2018-10-18 DIAGNOSIS — M797 Fibromyalgia: Secondary | ICD-10-CM

## 2018-10-18 DIAGNOSIS — Z8709 Personal history of other diseases of the respiratory system: Secondary | ICD-10-CM

## 2018-10-18 DIAGNOSIS — Z8679 Personal history of other diseases of the circulatory system: Secondary | ICD-10-CM

## 2018-10-18 DIAGNOSIS — Z8639 Personal history of other endocrine, nutritional and metabolic disease: Secondary | ICD-10-CM

## 2018-10-18 DIAGNOSIS — M7062 Trochanteric bursitis, left hip: Secondary | ICD-10-CM

## 2018-10-18 DIAGNOSIS — M5136 Other intervertebral disc degeneration, lumbar region: Secondary | ICD-10-CM

## 2018-10-18 DIAGNOSIS — F5101 Primary insomnia: Secondary | ICD-10-CM

## 2018-10-18 DIAGNOSIS — I7300 Raynaud's syndrome without gangrene: Secondary | ICD-10-CM

## 2018-10-18 DIAGNOSIS — Z8719 Personal history of other diseases of the digestive system: Secondary | ICD-10-CM

## 2018-10-18 DIAGNOSIS — R5382 Chronic fatigue, unspecified: Secondary | ICD-10-CM | POA: Diagnosis not present

## 2018-10-18 DIAGNOSIS — Z8669 Personal history of other diseases of the nervous system and sense organs: Secondary | ICD-10-CM

## 2018-10-18 MED ORDER — TRIAMCINOLONE ACETONIDE 40 MG/ML IJ SUSP
40.0000 mg | INTRAMUSCULAR | Status: AC | PRN
  Administered 2018-10-18: 40 mg via INTRA_ARTICULAR

## 2018-10-18 MED ORDER — LIDOCAINE HCL 1 % IJ SOLN
1.0000 mL | INTRAMUSCULAR | Status: AC | PRN
  Administered 2018-10-18: 1 mL

## 2018-10-18 NOTE — Patient Instructions (Signed)

## 2018-10-19 ENCOUNTER — Telehealth: Payer: Self-pay | Admitting: Rheumatology

## 2018-10-19 NOTE — Telephone Encounter (Signed)
Patient left a voicemail checking on the "new prescription" that Dr. Estanislado Pandy was sending to the pharmacy.  Patient requested a return call.

## 2018-10-22 NOTE — Telephone Encounter (Signed)
Patient states at her office visit on 10/18/18 it was mentioned to her about decreasing her Gabapentin and Zanaflex. Patient states there was a mention of Flexeril.  It's not mentioned in the office note. Please advise.

## 2018-10-23 MED ORDER — TIZANIDINE HCL 4 MG PO TABS: ORAL_TABLET | ORAL | 0 refills | Status: DC

## 2018-10-23 NOTE — Telephone Encounter (Signed)
I called patient and discussed that Flexeril has more side effects than Zanaflex.  She was in agreement and wants to proceed with refill on Zanaflex.  Needs refill her Zanaflex prescription.

## 2018-10-23 NOTE — Telephone Encounter (Signed)
Last Visit: 10/18/18 Next Visit: 04/19/19  Okay to refill per Dr. Estanislado Pandy

## 2018-11-07 ENCOUNTER — Ambulatory Visit (HOSPITAL_COMMUNITY)
Admission: EM | Admit: 2018-11-07 | Discharge: 2018-11-07 | Disposition: A | Discharge status: Home / Self Care | Payer: BLUE CROSS/BLUE SHIELD | Attending: Family Medicine | Admitting: Family Medicine

## 2018-11-07 ENCOUNTER — Encounter (HOSPITAL_COMMUNITY): Payer: Self-pay

## 2018-11-07 DIAGNOSIS — Z888 Allergy status to other drugs, medicaments and biological substances status: Secondary | ICD-10-CM | POA: Diagnosis not present

## 2018-11-07 DIAGNOSIS — Z885 Allergy status to narcotic agent status: Secondary | ICD-10-CM | POA: Insufficient documentation

## 2018-11-07 DIAGNOSIS — N390 Urinary tract infection, site not specified: Secondary | ICD-10-CM

## 2018-11-07 DIAGNOSIS — R350 Frequency of micturition: Secondary | ICD-10-CM | POA: Diagnosis not present

## 2018-11-07 DIAGNOSIS — Z79899 Other long term (current) drug therapy: Secondary | ICD-10-CM | POA: Insufficient documentation

## 2018-11-07 DIAGNOSIS — Z91040 Latex allergy status: Secondary | ICD-10-CM | POA: Diagnosis not present

## 2018-11-07 LAB — POCT URINALYSIS DIP (DEVICE)
Bilirubin Urine: NEGATIVE
GLUCOSE, UA: NEGATIVE mg/dL
KETONES UR: NEGATIVE mg/dL
Nitrite: NEGATIVE
PROTEIN: NEGATIVE mg/dL
Specific Gravity, Urine: >=1.03 (ref 1.005–1.030)
Urobilinogen, UA: 0.2 mg/dL (ref 0.0–1.0)
pH: 5.5 (ref 5.0–8.0)

## 2018-11-07 MED ORDER — NITROFURANTOIN MONOHYD MACRO 100 MG PO CAPS: 100.0000 mg | ORAL_CAPSULE | Freq: Two times a day (BID) | ORAL | 0 refills | Status: DC

## 2018-11-07 MED ORDER — FLUCONAZOLE 200 MG PO TABS: ORAL_TABLET | ORAL | 0 refills | Status: DC

## 2018-11-07 MED ORDER — PHENAZOPYRIDINE HCL 200 MG PO TABS: 200.0000 mg | ORAL_TABLET | Freq: Three times a day (TID) | ORAL | 0 refills | Status: DC

## 2018-11-07 NOTE — ED Provider Notes (Signed)
[facility]   CC: UTI symptoms  SUBJECTIVE:  Lauren Montes is a 63 y.o. female who complains of urinary frequency, urgency and dysuria for the past day.  Patient denies a precipitating event, recent sexual encounter, excessive caffeine intake. Complains of mild lower abdominal pressure.  Has NOT tried OTC medications.  Symptoms are made worse with urination.  Admits to similar symptoms in the past.  Denies fever, chills, nausea, vomiting, abdominal pain, flank pain, abnormal vaginal discharge or bleeding, hematuria.    LMP: Patient's last menstrual period was 11/07/1980.  ROS: As in HPI.  Past Medical History:  Diagnosis Date  . Asthma   . Atrophic vaginitis   . Blood transfusion without reported diagnosis 1975   with vaginal delivery--very anemic  . Cystic teratoma   . Elevated cholesterol   . Endometriosis   . Esophageal ulcer   . Fibromyalgia   . Fundic gland polyps of stomach, benign   . GERD (gastroesophageal reflux disease)   . History of diastolic dysfunction   . History of migraine headaches   . Leg edema    left leg  . Ocular rosacea   . Osteoarthritis    back,hands, knees  . Overactive bladder   . Raynaud's disease   . Tachycardia   . Tubular adenoma of colon 08/2016   Past Surgical History:  Procedure Laterality Date  . APPENDECTOMY    . BACK SURGERY    . BREAST EXCISIONAL BIOPSY Left   . BREAST SURGERY  10-09-13   breast reduction--Dr. Dessie Coma  . BUNIONECTOMY     bilateral  . CARDIOVASCULAR STRESS TEST  07/02/2008   EF 55-60%  . colonscopy    . DENTAL SURGERY  2007  . ESOPHAGOGASTRODUODENOSCOPY ENDOSCOPY    . FOOT SURGERY     LEFT FOOT  . left breast mass removed    . OOPHORECTOMY  1982   RSO partial and LSO, uterus  . REDUCTION MAMMAPLASTY Bilateral   . RSO  2002   partial  . ruptured disc surgery  07,08  . TOTAL ABDOMINAL HYSTERECTOMY     Allergies  Allergen Reactions  . Codeine Nausea And Vomiting    Migraine headache    . Lipitor [Atorvastatin] Other (See Comments)    Muscle ache, mind foggy,   . Latex Hives and Rash   Current Facility-Administered Medications on File Prior to Encounter  Medication Dose Route Frequency Provider Last Rate Last Dose  . 0.9 %  sodium chloride infusion  500 mL Intravenous Continuous Ladene Artist, MD       Current Outpatient Medications on File Prior to Encounter  Medication Sig Dispense Refill  . cycloSPORINE (RESTASIS) 0.05 % ophthalmic emulsion Place 1 drop into both eyes 2 (two) times daily.    Marland Kitchen diltiazem (CARDIZEM CD) 120 MG 24 hr capsule TAKE 1 CAPSULE BY MOUTH EVERY DAY 90 capsule 3  . DULERA 200-5 MCG/ACT AERO Inhale 2 puffs into the lungs 2 (two) times daily.  5  . fexofenadine (ALLEGRA) 180 MG tablet Take 180 mg by mouth daily.    Marland Kitchen gabapentin (NEURONTIN) 300 MG capsule TAKE 1 CAPSULE BY MOUTH EVERYDAY AT BEDTIME 90 capsule 1  . montelukast (SINGULAIR) 10 MG tablet Take 10 mg by mouth at bedtime.      . Multiple Vitamin (MULTIVITAMIN) tablet Take 1 tablet by mouth daily.    . Omega-3 Fatty Acids (OMEGA 3 PO) Take 1 capsule by mouth daily.      Marland Kitchen PROAIR HFA  108 (90 BASE) MCG/ACT inhaler Inhale 2 puffs into the lungs every 6 (six) hours as needed for wheezing or shortness of breath.   11  . rosuvastatin (CRESTOR) 5 MG tablet TAKE 1 TABLET (5 MG TOTAL) BY MOUTH DAILY AT 6 PM. 90 tablet 3  . sertraline (ZOLOFT) 50 MG tablet 1/2 TABLET ONCE DAILY  2  . tiZANidine (ZANAFLEX) 4 MG tablet Take one at bedtime as needed 90 tablet 0   Social History   Socioeconomic History  . Marital status: Married    Spouse name: Not on file  . Number of children: 1  . Years of education: Not on file  . Highest education level: Not on file  Occupational History  . Occupation: retired    Fish farm manager: UNEMPLOYED  Social Needs  . Financial resource strain: Not on file  . Food insecurity:    Worry: Not on file    Inability: Not on file  . Transportation needs:    Medical: Not on  file    Non-medical: Not on file  Tobacco Use  . Smoking status: Never Smoker  . Smokeless tobacco: Never Used  Substance and Sexual Activity  . Alcohol use: Yes    Comment: social   . Drug use: No  . Sexual activity: Yes    Partners: Male    Birth control/protection: Surgical    Comment: Hyst  Lifestyle  . Physical activity:    Days per week: Not on file    Minutes per session: Not on file  . Stress: Not on file  Relationships  . Social connections:    Talks on phone: Not on file    Gets together: Not on file    Attends religious service: Not on file    Active member of club or organization: Not on file    Attends meetings of clubs or organizations: Not on file    Relationship status: Not on file  . Intimate partner violence:    Fear of current or ex partner: Not on file    Emotionally abused: Not on file    Physically abused: Not on file    Forced sexual activity: Not on file  Other Topics Concern  . Not on file  Social History Narrative  . Not on file   Family History  Problem Relation Age of Onset  . Coronary artery disease Father   . Hypertension Father   . Heart disease Father   . Heart attack Father   . Asthma Mother   . Hypertension Mother   . Alzheimer's disease Maternal Grandmother   . Lung cancer Maternal Grandfather   . Heart attack Maternal Grandfather   . Heart failure Paternal Grandfather   . Heart attack Paternal Grandfather   . Skin cancer Sister        melanoma  . Hepatitis C Brother   . Skin cancer Brother        melanoma  . Pancreatic cancer Maternal Aunt   . Colon cancer Neg Hx   . Colon polyps Neg Hx   . Rectal cancer Neg Hx   . Stomach cancer Neg Hx   . Breast cancer Neg Hx     OBJECTIVE:  Vitals:   11/07/18 1744  BP: 128/74  Pulse: 80  Resp: 20  Temp: 98.6 F (37 C)  TempSrc: Oral  SpO2: 95%   General appearance: AOx3 in no acute distress HEENT: NCAT.  Oropharynx clear.  Lungs: clear to auscultation bilaterally  without adventitious breath sounds  Heart: regular rate and rhythm.  Radial pulses 2+ symmetrical bilaterally Abdomen: soft; non-distended; no tenderness; bowel sounds present; no guarding Back: no CVA tenderness Extremities: no edema; symmetrical with no gross deformities Skin: warm and dry Neurologic: Ambulates from chair to exam table without difficulty Psychological: alert and cooperative; normal mood and affect  Labs Reviewed  POCT URINALYSIS DIP (DEVICE) - Abnormal; Notable for the following components:      Result Value   Hgb urine dipstick MODERATE (*)    Leukocytes, UA MODERATE (*)    All other components within normal limits  URINE CULTURE    ASSESSMENT & PLAN:  1. Lower urinary tract infectious disease     Meds ordered this encounter  Medications  . nitrofurantoin, macrocrystal-monohydrate, (MACROBID) 100 MG capsule    Sig: Take 1 capsule (100 mg total) by mouth 2 (two) times daily.    Dispense:  10 capsule    Refill:  0    Order Specific Question:   Supervising Provider    Answer:   Raylene Everts [[id number]]  . phenazopyridine (PYRIDIUM) 200 MG tablet    Sig: Take 1 tablet (200 mg total) by mouth 3 (three) times daily.    Dispense:  6 tablet    Refill:  0    Order Specific Question:   Supervising Provider    Answer:   Raylene Everts [[id number]]  . fluconazole (DIFLUCAN) 200 MG tablet    Sig: Take one dose by mouth, wait 72 hours, and then take second dose by mouth    Dispense:  2 tablet    Refill:  0    Order Specific Question:   Supervising Provider    Answer:   Raylene Everts [[id number]]   Urine showed signs of possible UTI Urine culture sent.  We will call you with the results.   Push fluids and get plenty of rest.   Take antibiotic as directed and to completion Take pyridium as prescribed and as needed for symptomatic relief Diflucan for possible yeast infection with antibiotic Follow up with PCP if symptoms persists Return here or go to ER if  you have any new or worsening symptoms such as fever, worsening abdominal pain, nausea/vomiting, flank pain, etc...  Outlined signs and symptoms indicating need for more acute intervention. Patient verbalized understanding. After Visit Summary given.     Lestine Box, PA-C 11/07/18 1836

## 2018-11-07 NOTE — Discharge Instructions (Signed)
Urine showed signs of possible UTI Urine culture sent.  We will call you with the results.   Push fluids and get plenty of rest.   Take antibiotic as directed and to completion Take pyridium as prescribed and as needed for symptomatic relief Diflucan for possible yeast infection with antibiotic Follow up with PCP if symptoms persists Return here or go to ER if you have any new or worsening symptoms such as fever, worsening abdominal pain, nausea/vomiting, flank pain, etc..Marland Kitchen

## 2018-11-07 NOTE — ED Triage Notes (Signed)
Pt presents with urinary tract symptoms; burning with urination and urinary frequency.

## 2018-11-09 ENCOUNTER — Ambulatory Visit (HOSPITAL_COMMUNITY)
Admission: EM | Admit: 2018-11-09 | Discharge: 2018-11-09 | Disposition: A | Discharge status: Home / Self Care | Payer: BLUE CROSS/BLUE SHIELD | Attending: Family Medicine | Admitting: Family Medicine

## 2018-11-09 ENCOUNTER — Encounter (HOSPITAL_COMMUNITY): Payer: Self-pay | Admitting: Emergency Medicine

## 2018-11-09 DIAGNOSIS — N3090 Cystitis, unspecified without hematuria: Secondary | ICD-10-CM | POA: Diagnosis not present

## 2018-11-09 LAB — POCT URINALYSIS DIP (DEVICE)
Bilirubin Urine: NEGATIVE
Glucose, UA: NEGATIVE mg/dL
HGB URINE DIPSTICK: NEGATIVE
Nitrite: NEGATIVE
PH: 5.5 (ref 5.0–8.0)
PROTEIN: NEGATIVE mg/dL
SPECIFIC GRAVITY, URINE: 1.025 (ref 1.005–1.030)
Urobilinogen, UA: 0.2 mg/dL (ref 0.0–1.0)

## 2018-11-09 MED ORDER — CEPHALEXIN 500 MG PO CAPS: 500.0000 mg | ORAL_CAPSULE | Freq: Two times a day (BID) | ORAL | 0 refills | Status: DC

## 2018-11-09 MED ORDER — PHENAZOPYRIDINE HCL 200 MG PO TABS: 200.0000 mg | ORAL_TABLET | Freq: Three times a day (TID) | ORAL | 0 refills | Status: DC

## 2018-11-09 NOTE — ED Triage Notes (Signed)
Pt presents to [facility] for assessment after starting an antibiotic after her visit on 1/1.  States symptoms have not resolved, and she can no longer deal with the burning sensation.

## 2018-11-10 LAB — URINE CULTURE: Culture: 60000 — AB

## 2018-11-10 NOTE — ED Provider Notes (Signed)
[place]    ASSESSMENT & PLAN:  1. Cystitis     Meds ordered this encounter  Medications  . cephALEXin (KEFLEX) 500 MG capsule    Sig: Take 1 capsule (500 mg total) by mouth 2 (two) times daily.    Dispense:  10 capsule    Refill:  0  . phenazopyridine (PYRIDIUM) 200 MG tablet    Sig: Take 1 tablet (200 mg total) by mouth 3 (three) times daily.    Dispense:  6 tablet    Refill:  0   No signs of pyelonephritis. Reassured. Will stop Macrobid and start Keflex. Culture sensitivities still pending from last visit. Will follow up with her PCP or here if not showing improvement over the next 48 hours, sooner if needed.  Outlined signs and symptoms indicating need for more acute intervention. Patient verbalized understanding. After Visit Summary given.  SUBJECTIVE:  Lauren Montes is a 63 y.o. female who complains of urinary frequency, urgency and dysuria for the past several days. Seen here and Rx Macrobid; 2 days and without relief. No flank pain, fever, chills, abnormal vaginal discharge or bleeding. Hematuria: not present. Normal PO intake. No abdominal pain. No self treatment. Ambulatory without difficulty.  LMP: Patient's last menstrual period was 11/07/1980.  ROS: As in HPI.  OBJECTIVE:  Vitals:   11/09/18 2015  BP: 121/70  Pulse: (!) 110  Resp: 20  Temp: 98.3 F (36.8 C)  TempSrc: Oral  SpO2: 99%   Tachycardia noted. Regular. Appears well, in no apparent distress. Abdomen is soft without tenderness, guarding, mass, rebound or organomegaly. No CVA tenderness or inguinal adenopathy noted.  Labs Reviewed  POCT URINALYSIS DIP (DEVICE) - Abnormal; Notable for the following components:      Result Value   Ketones, ur TRACE (*)    Leukocytes, UA SMALL (*)    All other components within normal limits    Allergies  Allergen Reactions  . Codeine Nausea And Vomiting    Migraine headache  . Lipitor [Atorvastatin] Other (See Comments)    Muscle  ache, mind foggy,   . Latex Hives and Rash    Past Medical History:  Diagnosis Date  . Asthma   . Atrophic vaginitis   . Blood transfusion without reported diagnosis 1975   with vaginal delivery--very anemic  . Cystic teratoma   . Elevated cholesterol   . Endometriosis   . Esophageal ulcer   . Fibromyalgia   . Fundic gland polyps of stomach, benign   . GERD (gastroesophageal reflux disease)   . History of diastolic dysfunction   . History of migraine headaches   . Leg edema    left leg  . Ocular rosacea   . Osteoarthritis    back,hands, knees  . Overactive bladder   . Raynaud's disease   . Tachycardia   . Tubular adenoma of colon 08/2016   Social History   Socioeconomic History  . Marital status: Married    Spouse name: Not on file  . Number of children: 1  . Years of education: Not on file  . Highest education level: Not on file  Occupational History  . Occupation: retired    Fish farm manager: UNEMPLOYED  Social Needs  . Financial resource strain: Not on file  . Food insecurity:    Worry: Not on file    Inability: Not on file  . Transportation needs:    Medical: Not on file    Non-medical: Not on file  Tobacco Use  .  Smoking status: Never Smoker  . Smokeless tobacco: Never Used  Substance and Sexual Activity  . Alcohol use: Yes    Comment: social   . Drug use: No  . Sexual activity: Yes    Partners: Male    Birth control/protection: Surgical    Comment: Hyst  Lifestyle  . Physical activity:    Days per week: Not on file    Minutes per session: Not on file  . Stress: Not on file  Relationships  . Social connections:    Talks on phone: Not on file    Gets together: Not on file    Attends religious service: Not on file    Active member of club or organization: Not on file    Attends meetings of clubs or organizations: Not on file    Relationship status: Not on file  . Intimate partner violence:    Fear of current or ex partner: Not on file     Emotionally abused: Not on file    Physically abused: Not on file    Forced sexual activity: Not on file  Other Topics Concern  . Not on file  Social History Narrative  . Not on file   Family History  Problem Relation Age of Onset  . Coronary artery disease Father   . Hypertension Father   . Heart disease Father   . Heart attack Father   . Asthma Mother   . Hypertension Mother   . Alzheimer's disease Maternal Grandmother   . Lung cancer Maternal Grandfather   . Heart attack Maternal Grandfather   . Heart failure Paternal Grandfather   . Heart attack Paternal Grandfather   . Skin cancer Sister        melanoma  . Hepatitis C Brother   . Skin cancer Brother        melanoma  . Pancreatic cancer Maternal Aunt   . Colon cancer Neg Hx   . Colon polyps Neg Hx   . Rectal cancer Neg Hx   . Stomach cancer Neg Hx   . Breast cancer Neg Hx        Vanessa Kick, MD 11/10/18 [phone]

## 2018-11-13 ENCOUNTER — Telehealth (HOSPITAL_COMMUNITY): Payer: Self-pay | Admitting: Emergency Medicine

## 2018-11-13 MED ORDER — FLUCONAZOLE 150 MG PO TABS: 150.0000 mg | ORAL_TABLET | Freq: Once | ORAL | 0 refills | Status: AC

## 2018-11-13 NOTE — Telephone Encounter (Signed)
Urine culture was positive for e coli and was given keflex  at urgent care visit. Pt contacted and made aware, educated on completing antibiotic and to follow up if symptoms are persistent. Verbalized understanding. Pt states she feels like she still has a yeast infection and since she changed antibiotics halfway through, requesting diflucan. Refill sent to pt preferred pharmacy.

## 2018-11-16 ENCOUNTER — Other Ambulatory Visit: Payer: Self-pay

## 2018-11-16 ENCOUNTER — Encounter: Payer: Self-pay | Admitting: Obstetrics and Gynecology

## 2018-11-16 ENCOUNTER — Ambulatory Visit (INDEPENDENT_AMBULATORY_CARE_PROVIDER_SITE_OTHER): Payer: BLUE CROSS/BLUE SHIELD | Admitting: Obstetrics and Gynecology

## 2018-11-16 VITALS — BP 108/66 | HR 70 | Temp 97.5°F | Ht 65.5 in | Wt 137.4 lb

## 2018-11-16 DIAGNOSIS — R102 Pelvic and perineal pain unspecified side: Secondary | ICD-10-CM

## 2018-11-16 DIAGNOSIS — R1032 Left lower quadrant pain: Secondary | ICD-10-CM | POA: Diagnosis not present

## 2018-11-16 DIAGNOSIS — N760 Acute vaginitis: Secondary | ICD-10-CM | POA: Diagnosis not present

## 2018-11-16 LAB — POCT URINALYSIS DIPSTICK
Bilirubin, UA: NEGATIVE
GLUCOSE UA: NEGATIVE
Ketones, UA: NEGATIVE
LEUKOCYTES UA: NEGATIVE
NITRITE UA: NEGATIVE
PH UA: 5 (ref 5.0–8.0)
PROTEIN UA: NEGATIVE
RBC UA: NEGATIVE
UROBILINOGEN UA: 0.2 U/dL

## 2018-11-16 NOTE — Progress Notes (Signed)
GYNECOLOGY  VISIT   HPI: 63 y.o.   Married  Caucasian  female   [id number] with Patient's last menstrual period was 11/07/1980.here for possible UTI.    Patient states she began having lower pelvic discomfort that she describes as a "heavy" feeling on New Years day. She was seen at Urgent Care and treated for a UTI with Macrobid and then switched to Keflex when symptoms did not improve. She states a urine culture was done.  She is also on Diflucan.  Took one dose for itching and vaginal burning.  Used Monistat 4 days ago x 1.   Does not have frequency or burning.  Has a heaviness on her left side and it will not let up.  Pain was a 7 last hs and is a 3 now.  The pain caused her to sit down, and this improved her pain.  It is uncomfortable.  Last Bm was yesterday and did not improve her pain.  No diarrhea.  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  No fever. Felt chilled yesterday.   UC showed E Coli sensitive to Keflex.   UC - Enterococcus 03/14/18. UC - Enterococcus 03/10/17.  Urine Dip:Neg  GYNECOLOGIC HISTORY: Patient's last menstrual period was 11/07/1980. Contraception: Hysterectomy Menopausal hormone therapy:  none Last mammogram: 09-06-18 3D Neg/density B/BiRads1 Last pap smear:  03-06-15 Neg:Neg HR HPV        OB History    Gravida  1   Para  1   Term  1   Preterm      AB      Living  1     SAB      TAB      Ectopic      Multiple      Live Births                 Patient Active Problem List   Diagnosis Date Noted  . SVT (supraventricular tachycardia) ([place]) 09/26/2016  . HTN (hypertension) 09/24/2015  . Palpitations 03/05/2015  . Fibromyalgia 03/05/2015  . Chronic diastolic CHF (congestive heart failure), NYHA class 2 ([place]) 06/20/2014  . Sinus tachycardia 09/14/2012  . Dysphagia 06/29/2012  . Ocular rosacea   . GERD (gastroesophageal reflux disease)   . Cough 09/09/2011  . Chest tightness, discomfort, or pressure 09/09/2011  . Hyperlipidemia  05/10/2011  . SWELLING OF LIMB 01/20/2011  . GERD 02/03/2010  . ABDOMINAL PAIN-EPIGASTRIC 02/03/2010  . ABDOMINAL PAIN-MULTIPLE SITES 02/03/2010  . CONSTIPATION 09/19/2008  . ABDOMINAL PAIN, LEFT LOWER QUADRANT 09/19/2008  . COLONIC POLYPS, HYPERPLASTIC, HX OF 09/19/2008    Past Medical History:  Diagnosis Date  . Asthma   . Atrophic vaginitis   . Blood transfusion without reported diagnosis 1975   with vaginal delivery--very anemic  . Cystic teratoma   . Elevated cholesterol   . Endometriosis   . Esophageal ulcer   . Fibromyalgia   . Fundic gland polyps of stomach, benign   . GERD (gastroesophageal reflux disease)   . History of diastolic dysfunction   . History of migraine headaches   . Leg edema    left leg  . Ocular rosacea   . Osteoarthritis    back,hands, knees  . Overactive bladder   . Raynaud's disease   . Tachycardia   . Tubular adenoma of colon 08/2016    Past Surgical History:  Procedure Laterality Date  . APPENDECTOMY    . BACK SURGERY    . BREAST EXCISIONAL BIOPSY Left   .  BREAST SURGERY  10-09-13   breast reduction--Dr. Dessie Coma  . BUNIONECTOMY     bilateral  . CARDIOVASCULAR STRESS TEST  07/02/2008   EF 55-60%  . colonscopy    . DENTAL SURGERY  2007  . ESOPHAGOGASTRODUODENOSCOPY ENDOSCOPY    . FOOT SURGERY     LEFT FOOT  . left breast mass removed    . OOPHORECTOMY  1982   RSO partial and LSO, uterus  . REDUCTION MAMMAPLASTY Bilateral   . RSO  2002   partial  . ruptured disc surgery  07,08  . TOTAL ABDOMINAL HYSTERECTOMY      Current Outpatient Medications  Medication Sig Dispense Refill  . cephALEXin (KEFLEX) 500 MG capsule Take 1 capsule (500 mg total) by mouth 2 (two) times daily. 10 capsule 0  . cycloSPORINE (RESTASIS) 0.05 % ophthalmic emulsion Place 1 drop into both eyes 2 (two) times daily.    Marland Kitchen diltiazem (CARDIZEM CD) 120 MG 24 hr capsule TAKE 1 CAPSULE BY MOUTH EVERY DAY 90 capsule 3  . DULERA 200-5 MCG/ACT AERO Inhale 2 puffs  into the lungs 2 (two) times daily.  5  . fexofenadine (ALLEGRA) 180 MG tablet Take 180 mg by mouth daily.    . fluconazole (DIFLUCAN) 200 MG tablet Take one dose by mouth, wait 72 hours, and then take second dose by mouth 2 tablet 0  . gabapentin (NEURONTIN) 300 MG capsule TAKE 1 CAPSULE BY MOUTH EVERYDAY AT BEDTIME 90 capsule 1  . montelukast (SINGULAIR) 10 MG tablet Take 10 mg by mouth at bedtime.      . Multiple Vitamin (MULTIVITAMIN) tablet Take 1 tablet by mouth daily.    . Omega-3 Fatty Acids (OMEGA 3 PO) Take 1 capsule by mouth daily.      . phenazopyridine (PYRIDIUM) 200 MG tablet Take 1 tablet (200 mg total) by mouth 3 (three) times daily. 6 tablet 0  . PROAIR HFA 108 (90 BASE) MCG/ACT inhaler Inhale 2 puffs into the lungs every 6 (six) hours as needed for wheezing or shortness of breath.   11  . rosuvastatin (CRESTOR) 5 MG tablet TAKE 1 TABLET (5 MG TOTAL) BY MOUTH DAILY AT 6 PM. 90 tablet 3  . sertraline (ZOLOFT) 50 MG tablet 1/2 TABLET ONCE DAILY  2  . tiZANidine (ZANAFLEX) 4 MG tablet Take one at bedtime as needed 90 tablet 0   Current Facility-Administered Medications  Medication Dose Route Frequency Provider Last Rate Last Dose  . 0.9 %  sodium chloride infusion  500 mL Intravenous Continuous Ladene Artist, MD         ALLERGIES: Codeine; Lipitor [atorvastatin]; and Latex  Family History  Problem Relation Age of Onset  . Coronary artery disease Father   . Hypertension Father   . Heart disease Father   . Heart attack Father   . Asthma Mother   . Hypertension Mother   . Alzheimer's disease Maternal Grandmother   . Lung cancer Maternal Grandfather   . Heart attack Maternal Grandfather   . Heart failure Paternal Grandfather   . Heart attack Paternal Grandfather   . Skin cancer Sister        melanoma  . Hepatitis C Brother   . Skin cancer Brother        melanoma  . Pancreatic cancer Maternal Aunt   . Colon cancer Neg Hx   . Colon polyps Neg Hx   . Rectal cancer  Neg Hx   . Stomach cancer Neg Hx   .  Breast cancer Neg Hx     Social History   Socioeconomic History  . Marital status: Married    Spouse name: Not on file  . Number of children: 1  . Years of education: Not on file  . Highest education level: Not on file  Occupational History  . Occupation: retired    Fish farm manager: UNEMPLOYED  Social Needs  . Financial resource strain: Not on file  . Food insecurity:    Worry: Not on file    Inability: Not on file  . Transportation needs:    Medical: Not on file    Non-medical: Not on file  Tobacco Use  . Smoking status: Never Smoker  . Smokeless tobacco: Never Used  Substance and Sexual Activity  . Alcohol use: Yes    Comment: social   . Drug use: No  . Sexual activity: Yes    Partners: Male    Birth control/protection: Surgical    Comment: Hyst  Lifestyle  . Physical activity:    Days per week: Not on file    Minutes per session: Not on file  . Stress: Not on file  Relationships  . Social connections:    Talks on phone: Not on file    Gets together: Not on file    Attends religious service: Not on file    Active member of club or organization: Not on file    Attends meetings of clubs or organizations: Not on file    Relationship status: Not on file  . Intimate partner violence:    Fear of current or ex partner: Not on file    Emotionally abused: Not on file    Physically abused: Not on file    Forced sexual activity: Not on file  Other Topics Concern  . Not on file  Social History Narrative  . Not on file    Review of Systems  Genitourinary: Positive for pelvic pain.       Discomfort at bladder area  All other systems reviewed and are negative.   PHYSICAL EXAMINATION:    BP 108/66 (BP Location: Right Arm, Patient Position: Sitting, Cuff Size: Normal)   Pulse 70   Temp (!) 97.5 F (36.4 C) (Oral)   Ht 5' 5.5" (1.664 m)   Wt 137 lb 6.4 oz (62.3 kg)   LMP 11/07/1980   BMI 22.52 kg/m     General appearance:  alert, cooperative and appears stated age   Abdomen: soft, no masses,  no organomegaly, tenderness in LLQ without guarding or rebound.  Back:  Negative CVA tenderness.   Pelvic: External genitalia:  no lesions              Urethra:  normal appearing urethra with no masses, tenderness or lesions              Bartholins and Skenes: normal                 Vagina: normal appearing vagina with normal color and discharge, no lesions              Cervix:  absent                Bimanual Exam:  Uterus:  normal size, contour, position, consistency, mobility, non-tender              Adnexa: no mass, fullness.  Tenderness in left adnexal region.              Rectal exam: Yes.  Marland Kitchen  Confirms.              Anus:  normal sphincter tone, no lesions  Chaperone was present for exam.  ASSESSMENT  Hx UTIs.   Recent E Coli UTI. LLQ pain.  Early diverticulitis? IBS.  Vaginitis Status post TAH.  Status post partial RSO and status post LSO. States that she has no more tubes or ovaries.   PLAN  Will send urine culture.  Affirm sent.  I recommend she contact her PCP for further evaluation as I do not think her pain is gynecologic in origin.  Questions invited and answered.   An After Visit Summary was printed and given to the patient.  __25____ minutes face to face time of which over 50% was spent in counseling.

## 2018-11-16 NOTE — Addendum Note (Signed)
Addended by: Lowella Fairy on: 11/16/2018 10:14 AM   Modules accepted: Orders

## 2018-11-17 LAB — VAGINITIS/VAGINOSIS, DNA PROBE
CANDIDA SPECIES: NEGATIVE
Gardnerella vaginalis: NEGATIVE
Trichomonas vaginosis: NEGATIVE

## 2018-11-17 LAB — URINE CULTURE: ORGANISM ID, BACTERIA: NO GROWTH

## 2018-11-20 ENCOUNTER — Other Ambulatory Visit: Payer: Self-pay | Admitting: Internal Medicine

## 2018-11-20 DIAGNOSIS — Z6822 Body mass index (BMI) 22.0-22.9, adult: Secondary | ICD-10-CM | POA: Diagnosis not present

## 2018-11-20 DIAGNOSIS — R1084 Generalized abdominal pain: Secondary | ICD-10-CM | POA: Diagnosis not present

## 2018-11-20 DIAGNOSIS — R109 Unspecified abdominal pain: Secondary | ICD-10-CM

## 2018-11-21 ENCOUNTER — Ambulatory Visit
Admission: RE | Admit: 2018-11-21 | Discharge: 2018-11-21 | Disposition: A | Discharge status: Home / Self Care | Payer: BLUE CROSS/BLUE SHIELD | Source: Ambulatory Visit | Attending: Internal Medicine | Admitting: Internal Medicine

## 2018-11-21 DIAGNOSIS — R109 Unspecified abdominal pain: Secondary | ICD-10-CM

## 2018-11-21 DIAGNOSIS — R1032 Left lower quadrant pain: Secondary | ICD-10-CM | POA: Diagnosis not present

## 2018-11-21 MED ORDER — IOPAMIDOL (ISOVUE-300) INJECTION 61%
100.0000 mL | Freq: Once | INTRAVENOUS | Status: AC | PRN
  Administered 2018-11-21: 100 mL via INTRAVENOUS

## 2018-12-17 ENCOUNTER — Other Ambulatory Visit: Payer: Self-pay | Admitting: Rheumatology

## 2018-12-21 ENCOUNTER — Encounter (HOSPITAL_COMMUNITY): Payer: Self-pay | Admitting: Emergency Medicine

## 2018-12-21 ENCOUNTER — Ambulatory Visit (HOSPITAL_COMMUNITY)
Admission: EM | Admit: 2018-12-21 | Discharge: 2018-12-21 | Disposition: A | Discharge status: Home / Self Care | Payer: BLUE CROSS/BLUE SHIELD | Attending: Family Medicine | Admitting: Family Medicine

## 2018-12-21 DIAGNOSIS — R350 Frequency of micturition: Secondary | ICD-10-CM | POA: Diagnosis not present

## 2018-12-21 DIAGNOSIS — R3915 Urgency of urination: Secondary | ICD-10-CM

## 2018-12-21 DIAGNOSIS — R3989 Other symptoms and signs involving the genitourinary system: Secondary | ICD-10-CM | POA: Diagnosis not present

## 2018-12-21 LAB — POCT URINALYSIS DIP (DEVICE)
Bilirubin Urine: NEGATIVE
Glucose, UA: NEGATIVE mg/dL
HGB URINE DIPSTICK: NEGATIVE
Ketones, ur: NEGATIVE mg/dL
Leukocytes,Ua: NEGATIVE
Nitrite: NEGATIVE
PH: 6 (ref 5.0–8.0)
Protein, ur: NEGATIVE mg/dL
Specific Gravity, Urine: 1.01 (ref 1.005–1.030)
Urobilinogen, UA: 0.2 mg/dL (ref 0.0–1.0)

## 2018-12-21 MED ORDER — PHENAZOPYRIDINE HCL 200 MG PO TABS: 200.0000 mg | ORAL_TABLET | Freq: Three times a day (TID) | ORAL | 0 refills | Status: DC

## 2018-12-21 NOTE — ED Provider Notes (Signed)
[facility]   CC: Bladder pressure  SUBJECTIVE:  Lauren Montes is a 63 y.o. female who complains of bladder pressure, urinary frequency, and urgency x 10 days.  Patient denies a precipitating event, recent sexual encounter, excessive caffeine intake.  Localizes the pressure to over the bladder.  Called PCP 5 days ago and a prescription for bactrim was sent for potential UTI.  Patient has been compliant with medication and states dysuria has improved, but bladder pressure has persisted.  Admits to frequent UTIs in the past month.  Has been seen at [facility] x 3 and prescribed antibiotics for UTIs with temporary relief.  Had CT scan ordered by PCP and was negative.  Denies fever, chills, nausea, vomiting, abdominal pain, flank pain, vaginal pain, abnormal vaginal discharge or bleeding, hematuria.    LMP: Patient's last menstrual period was 11/07/1980.  ROS: As in HPI.  Denies personal hx of ovarian, uterine, bladder cancer  Denies family hx of ovarian, uterine, bladder cancer  Past Medical History:  Diagnosis Date  . Asthma   . Atrophic vaginitis   . Blood transfusion without reported diagnosis 1975   with vaginal delivery--very anemic  . Cystic teratoma   . Elevated cholesterol   . Endometriosis   . Esophageal ulcer   . Fibromyalgia   . Fundic gland polyps of stomach, benign   . GERD (gastroesophageal reflux disease)   . History of diastolic dysfunction   . History of migraine headaches   . Leg edema    left leg  . Ocular rosacea   . Osteoarthritis    back,hands, knees  . Overactive bladder   . Raynaud's disease   . Tachycardia   . Tubular adenoma of colon 08/2016   Past Surgical History:  Procedure Laterality Date  . APPENDECTOMY    . BACK SURGERY    . BREAST EXCISIONAL BIOPSY Left   . BREAST SURGERY  10-09-13   breast reduction--Dr. Dessie Coma  . BUNIONECTOMY     bilateral  . CARDIOVASCULAR STRESS TEST  07/02/2008   EF 55-60%  . colonscopy    . DENTAL  SURGERY  2007  . ESOPHAGOGASTRODUODENOSCOPY ENDOSCOPY    . FOOT SURGERY     LEFT FOOT  . left breast mass removed    . OOPHORECTOMY  1982   RSO partial and LSO, uterus  . REDUCTION MAMMAPLASTY Bilateral   . RSO  2002   partial  . ruptured disc surgery  07,08  . TOTAL ABDOMINAL HYSTERECTOMY     Allergies  Allergen Reactions  . Codeine Nausea And Vomiting    Migraine headache  . Lipitor [Atorvastatin] Other (See Comments)    Muscle ache, mind foggy,   . Latex Hives and Rash   Current Facility-Administered Medications on File Prior to Encounter  Medication Dose Route Frequency Provider Last Rate Last Dose  . 0.9 %  sodium chloride infusion  500 mL Intravenous Continuous Ladene Artist, MD       Current Outpatient Medications on File Prior to Encounter  Medication Sig Dispense Refill  . cycloSPORINE (RESTASIS) 0.05 % ophthalmic emulsion Place 1 drop into both eyes 2 (two) times daily.    Marland Kitchen diltiazem (CARDIZEM CD) 120 MG 24 hr capsule TAKE 1 CAPSULE BY MOUTH EVERY DAY 90 capsule 3  . DULERA 200-5 MCG/ACT AERO Inhale 2 puffs into the lungs 2 (two) times daily.  5  . fexofenadine (ALLEGRA) 180 MG tablet Take 180 mg by mouth daily.    Marland Kitchen gabapentin (NEURONTIN)  300 MG capsule TAKE 1 CAPSULE BY MOUTH EVERYDAY AT BEDTIME 90 capsule 1  . montelukast (SINGULAIR) 10 MG tablet Take 10 mg by mouth at bedtime.      . Multiple Vitamin (MULTIVITAMIN) tablet Take 1 tablet by mouth daily.    . Omega-3 Fatty Acids (OMEGA 3 PO) Take 1 capsule by mouth daily.      Marland Kitchen PROAIR HFA 108 (90 BASE) MCG/ACT inhaler Inhale 2 puffs into the lungs every 6 (six) hours as needed for wheezing or shortness of breath.   11  . rosuvastatin (CRESTOR) 5 MG tablet TAKE 1 TABLET (5 MG TOTAL) BY MOUTH DAILY AT 6 PM. 90 tablet 3  . sertraline (ZOLOFT) 50 MG tablet 1/2 TABLET ONCE DAILY  2  . sulfamethoxazole-trimethoprim (BACTRIM DS,SEPTRA DS) 800-160 MG tablet     . tiZANidine (ZANAFLEX) 4 MG tablet Take one at bedtime as  needed 90 tablet 0   Social History   Socioeconomic History  . Marital status: Married    Spouse name: Not on file  . Number of children: 1  . Years of education: Not on file  . Highest education level: Not on file  Occupational History  . Occupation: retired    Fish farm manager: UNEMPLOYED  Social Needs  . Financial resource strain: Not on file  . Food insecurity:    Worry: Not on file    Inability: Not on file  . Transportation needs:    Medical: Not on file    Non-medical: Not on file  Tobacco Use  . Smoking status: Never Smoker  . Smokeless tobacco: Never Used  Substance and Sexual Activity  . Alcohol use: Yes    Comment: social   . Drug use: No  . Sexual activity: Yes    Partners: Male    Birth control/protection: Surgical    Comment: Hyst  Lifestyle  . Physical activity:    Days per week: Not on file    Minutes per session: Not on file  . Stress: Not on file  Relationships  . Social connections:    Talks on phone: Not on file    Gets together: Not on file    Attends religious service: Not on file    Active member of club or organization: Not on file    Attends meetings of clubs or organizations: Not on file    Relationship status: Not on file  . Intimate partner violence:    Fear of current or ex partner: Not on file    Emotionally abused: Not on file    Physically abused: Not on file    Forced sexual activity: Not on file  Other Topics Concern  . Not on file  Social History Narrative  . Not on file   Family History  Problem Relation Age of Onset  . Coronary artery disease Father   . Hypertension Father   . Heart disease Father   . Heart attack Father   . Asthma Mother   . Hypertension Mother   . Alzheimer's disease Maternal Grandmother   . Lung cancer Maternal Grandfather   . Heart attack Maternal Grandfather   . Heart failure Paternal Grandfather   . Heart attack Paternal Grandfather   . Skin cancer Sister        melanoma  . Hepatitis C Brother     . Skin cancer Brother        melanoma  . Pancreatic cancer Maternal Aunt   . Colon cancer Neg Hx   .  Colon polyps Neg Hx   . Rectal cancer Neg Hx   . Stomach cancer Neg Hx   . Breast cancer Neg Hx     OBJECTIVE:  Vitals:   12/21/18 1928 12/21/18 1929  BP:  130/68  Pulse: 91   Resp: 18   Temp: 98.9 F (37.2 C)   SpO2: 100%    General appearance: Alert in no acute distress HEENT: NCAT.  Oropharynx clear.  Lungs: clear to auscultation bilaterally without adventitious breath sounds Heart: regular rate and rhythm.  Radial pulses 2+ symmetrical bilaterally Abdomen: soft; non-distended; no tenderness; bowel sounds present; no masses or organomegaly; no guarding Back: no CVA tenderness Extremities: no edema; symmetrical with no gross deformities Skin: warm and dry Neurologic: Ambulates from chair to exam table without difficulty Psychological: alert and cooperative; normal mood and affect  Labs Reviewed  URINE CULTURE  POCT URINALYSIS DIP (DEVICE)    Results for orders placed or performed during the hospital encounter of 12/21/18 (from the past 24 hour(s))  POCT urinalysis dip (device)     Status: None   Collection Time: 12/21/18  7:34 PM  Result Value Ref Range   Glucose, UA NEGATIVE NEGATIVE mg/dL   Bilirubin Urine NEGATIVE NEGATIVE   Ketones, ur NEGATIVE NEGATIVE mg/dL   Specific Gravity, Urine 1.010 1.005 - 1.030   Hgb urine dipstick NEGATIVE NEGATIVE   pH 6.0 5.0 - 8.0   Protein, ur NEGATIVE NEGATIVE mg/dL   Urobilinogen, UA 0.2 0.0 - 1.0 mg/dL   Nitrite NEGATIVE NEGATIVE   Leukocytes,Ua NEGATIVE NEGATIVE     ASSESSMENT & PLAN:  1. Sensation of pressure in bladder area     Meds ordered this encounter  Medications  . phenazopyridine (PYRIDIUM) 200 MG tablet    Sig: Take 1 tablet (200 mg total) by mouth 3 (three) times daily.    Dispense:  6 tablet    Refill:  0    Order Specific Question:   Supervising Provider    Answer:   Raylene Everts [[id number]]    Urine did not show sign of infection.  We will sent out to culture and notify you of abnormal results Push fluids and get rest Pyridium prescription given.  Please fill prescription if needed for bladder pressure and/or pain Follow up with urology ASAP Return or go to the ED if you have any new or worsening symptoms such as fever, chills, nausea, vomiting, flank pain, dysuria, loss of control of bowels or bladder, vaginal pain, etc...  Outlined signs and symptoms indicating need for more acute intervention. Patient verbalized understanding. After Visit Summary given.     Lestine Box, PA-C 12/21/18 2131

## 2018-12-21 NOTE — ED Triage Notes (Addendum)
Pt states she thinks she has a uti again, states shes had 3 utis, pressure on her bladder starting last weekend. States this is the 3rd uti shes had since the new year. States her pcp gave her bactrim on 2/10 and it helped a little bit and now its back to hurting again.

## 2018-12-21 NOTE — Discharge Instructions (Addendum)
Urine did not show sign of infection.  We will sent out to culture and notify you of abnormal results Push fluids and get rest Pyridium prescription given.  Please fill prescription if needed for bladder pressure and/or pain Follow up with urology ASAP Return or go to the ED if you have any new or worsening symptoms such as fever, chills, nausea, vomiting, flank pain, dysuria, loss of control of bowels or bladder, vaginal pain, etc..Lauren Montes

## 2018-12-31 DIAGNOSIS — Z0000 Encounter for general adult medical examination without abnormal findings: Secondary | ICD-10-CM | POA: Diagnosis not present

## 2018-12-31 DIAGNOSIS — E7800 Pure hypercholesterolemia, unspecified: Secondary | ICD-10-CM | POA: Diagnosis not present

## 2018-12-31 DIAGNOSIS — R82998 Other abnormal findings in urine: Secondary | ICD-10-CM | POA: Diagnosis not present

## 2019-01-07 DIAGNOSIS — R300 Dysuria: Secondary | ICD-10-CM | POA: Diagnosis not present

## 2019-01-07 DIAGNOSIS — Z79890 Hormone replacement therapy (postmenopausal): Secondary | ICD-10-CM | POA: Diagnosis not present

## 2019-01-07 DIAGNOSIS — G43909 Migraine, unspecified, not intractable, without status migrainosus: Secondary | ICD-10-CM | POA: Diagnosis not present

## 2019-01-07 DIAGNOSIS — Z1331 Encounter for screening for depression: Secondary | ICD-10-CM | POA: Diagnosis not present

## 2019-01-07 DIAGNOSIS — N3010 Interstitial cystitis (chronic) without hematuria: Secondary | ICD-10-CM | POA: Insufficient documentation

## 2019-01-07 DIAGNOSIS — M47819 Spondylosis without myelopathy or radiculopathy, site unspecified: Secondary | ICD-10-CM | POA: Diagnosis not present

## 2019-01-07 DIAGNOSIS — J4540 Moderate persistent asthma, uncomplicated: Secondary | ICD-10-CM | POA: Diagnosis not present

## 2019-01-07 DIAGNOSIS — Z0000 Encounter for general adult medical examination without abnormal findings: Secondary | ICD-10-CM | POA: Diagnosis not present

## 2019-01-09 DIAGNOSIS — Z1212 Encounter for screening for malignant neoplasm of rectum: Secondary | ICD-10-CM | POA: Diagnosis not present

## 2019-01-22 DIAGNOSIS — M5416 Radiculopathy, lumbar region: Secondary | ICD-10-CM | POA: Diagnosis not present

## 2019-01-22 DIAGNOSIS — M549 Dorsalgia, unspecified: Secondary | ICD-10-CM | POA: Diagnosis not present

## 2019-01-22 DIAGNOSIS — M47816 Spondylosis without myelopathy or radiculopathy, lumbar region: Secondary | ICD-10-CM | POA: Diagnosis not present

## 2019-01-22 DIAGNOSIS — M546 Pain in thoracic spine: Secondary | ICD-10-CM | POA: Diagnosis not present

## 2019-01-22 DIAGNOSIS — Z9889 Other specified postprocedural states: Secondary | ICD-10-CM | POA: Diagnosis not present

## 2019-01-22 DIAGNOSIS — M5136 Other intervertebral disc degeneration, lumbar region: Secondary | ICD-10-CM | POA: Diagnosis not present

## 2019-02-07 ENCOUNTER — Other Ambulatory Visit: Payer: Self-pay | Admitting: Gastroenterology

## 2019-02-20 ENCOUNTER — Telehealth: Payer: Self-pay | Admitting: Cardiovascular Disease

## 2019-02-20 NOTE — Telephone Encounter (Signed)
New Message:    Patient calling because she would like a sooner appt. Please call patient.

## 2019-02-20 NOTE — Telephone Encounter (Signed)
Spoke with patient who states she would like to move up her May appointment with Dr. Acie Fredrickson. She states when she saw him in November he advised her to call back if her symptoms of SOB and palpitations do not improve. She states she continues to have these symptoms and is concerned. I advised her of the virtual office visits and scheduled her to see Dr. Acie Fredrickson tomorrow. She verbalized understanding and agreement with plan and thanked me for the call.

## 2019-02-20 NOTE — Telephone Encounter (Signed)
Spoke with patient who verified all demographics. She is active on My Chart.

## 2019-02-21 ENCOUNTER — Other Ambulatory Visit: Payer: Self-pay

## 2019-02-21 ENCOUNTER — Telehealth (INDEPENDENT_AMBULATORY_CARE_PROVIDER_SITE_OTHER): Payer: BLUE CROSS/BLUE SHIELD | Admitting: Cardiovascular Disease

## 2019-02-21 ENCOUNTER — Encounter: Payer: Self-pay | Admitting: Cardiovascular Disease

## 2019-02-21 ENCOUNTER — Telehealth: Payer: Self-pay | Admitting: *Deleted

## 2019-02-21 VITALS — BP 121/76 | HR 69 | Ht 66.0 in | Wt 137.0 lb

## 2019-02-21 DIAGNOSIS — R0789 Other chest pain: Secondary | ICD-10-CM | POA: Diagnosis not present

## 2019-02-21 DIAGNOSIS — I471 Supraventricular tachycardia: Secondary | ICD-10-CM

## 2019-02-21 DIAGNOSIS — R0602 Shortness of breath: Secondary | ICD-10-CM

## 2019-02-21 DIAGNOSIS — R000 Tachycardia, unspecified: Secondary | ICD-10-CM

## 2019-02-21 DIAGNOSIS — R002 Palpitations: Secondary | ICD-10-CM

## 2019-02-21 DIAGNOSIS — Z7189 Other specified counseling: Secondary | ICD-10-CM

## 2019-02-21 DIAGNOSIS — I10 Essential (primary) hypertension: Secondary | ICD-10-CM

## 2019-02-21 MED ORDER — DILTIAZEM HCL 30 MG PO TABS: 30.0000 mg | ORAL_TABLET | Freq: Four times a day (QID) | ORAL | 11 refills | Status: DC | PRN

## 2019-02-21 NOTE — Patient Instructions (Addendum)
Medication Instructions:  Your physician has recommended you make the following change in your medication:  START Diltiazem 30 mg up to 4 times per day as needed in addition to your Diltiazem 120 mg     Lab work: Currently scheduled for May 14 for fasting lipid, liver panel and basic metabolic panel. We will call you if we need to reschedule this appointment    Testing/Procedures: Your physician has recommended that you wear an event monitor. Event monitors are medical devices that record the heart's electrical activity. Doctors most often [place] these monitors to diagnose arrhythmias. Arrhythmias are problems with the speed or rhythm of the heartbeat. The monitor is a small, portable device. You can wear one while you do your normal daily activities. This is usually used to diagnose what is causing palpitations/syncope (passing out).    Follow-Up: Your physician recommends that you keep your follow-up appointment on May 21 at 2:40 pm with Dr. Acie Fredrickson

## 2019-02-21 NOTE — Progress Notes (Signed)
Virtual Visit via Video Note   This visit type was conducted due to national recommendations for restrictions regarding the COVID-19 Pandemic (e.g. social distancing) in an effort to limit this patient's exposure and mitigate transmission in our community.  Due to her co-morbid illnesses, this patient is at least at moderate risk for complications without adequate follow up.  This format is felt to be most appropriate for this patient at this time.  All issues noted in this document were discussed and addressed.  A limited physical exam was performed with this format.  Please refer to the patient's chart for her consent to telehealth for [facility].   Evaluation Performed:  Follow-up visit  Date:  02/21/2019   ID:  Lauren Montes, DOB [birth year], MRN [id number]  Patient Location: Home Provider Location: Home  PCP:  Tisovec, Fransico Him, MD  Cardiologist:  Mertie Moores, MD  Electrophysiologist:  None   Chief Complaint:  SVT   History of Present Illness:    Lauren Montes is a 63 y.o. female with history of mild diastolic dysfunction and leg edema.  She has a history of hyperlipidemia.  She has had a history of SVT in the past.  She has had progressive tightness in her chest  Does not seem to be related to her asthma Has some nasal stuffiness - may be related to pollen  Has tightness in her chest , mostly in the afternoon Gradual worsening of her dyspnea Tries her inhaler without any benefit Makes her HR faster ( 90-100)  Staying hydrated , eating regularly ,   Has tried walking some but she gets winded.  5-10 minutes of exercise causes her to become winded. No PND  We have tried prn propranolol but she developed Raynauds  No cold symptoms. No fever, cough Social distancing Wears a mask  The patient does not have symptoms concerning for COVID-19 infection (fever, chills, cough, or new shortness of breath).    Past Medical History:  Diagnosis Date  . Asthma   .  Atrophic vaginitis   . Blood transfusion without reported diagnosis 1975   with vaginal delivery--very anemic  . Cystic teratoma   . Elevated cholesterol   . Endometriosis   . Esophageal ulcer   . Fibromyalgia   . Fundic gland polyps of stomach, benign   . GERD (gastroesophageal reflux disease)   . History of diastolic dysfunction   . History of migraine headaches   . Leg edema    left leg  . Ocular rosacea   . Osteoarthritis    back,hands, knees  . Overactive bladder   . Raynaud's disease   . Tachycardia   . Tubular adenoma of colon 08/2016   Past Surgical History:  Procedure Laterality Date  . APPENDECTOMY    . BACK SURGERY    . BREAST EXCISIONAL BIOPSY Left   . BREAST SURGERY  10-09-13   breast reduction--Dr. Dessie Coma  . BUNIONECTOMY     bilateral  . CARDIOVASCULAR STRESS TEST  07/02/2008   EF 55-60%  . colonscopy    . DENTAL SURGERY  2007  . ESOPHAGOGASTRODUODENOSCOPY ENDOSCOPY    . FOOT SURGERY     LEFT FOOT  . left breast mass removed    . OOPHORECTOMY  1982   RSO partial and LSO, uterus  . REDUCTION MAMMAPLASTY Bilateral   . RSO  2002   partial  . ruptured disc surgery  07,08  . TOTAL ABDOMINAL HYSTERECTOMY       Current  Meds  Medication Sig  . cycloSPORINE (RESTASIS) 0.05 % ophthalmic emulsion Place 1 drop into both eyes 2 (two) times daily.  Marland Kitchen dicyclomine (BENTYL) 10 MG capsule Take 10 mg by mouth as needed for spasms.  Marland Kitchen diltiazem (CARDIZEM CD) 120 MG 24 hr capsule TAKE 1 CAPSULE BY MOUTH EVERY DAY  . DULERA 200-5 MCG/ACT AERO Inhale 2 puffs into the lungs 2 (two) times daily.  . fexofenadine (ALLEGRA) 180 MG tablet Take 180 mg by mouth daily.  Marland Kitchen gabapentin (NEURONTIN) 300 MG capsule TAKE 1 CAPSULE BY MOUTH EVERYDAY AT BEDTIME  . montelukast (SINGULAIR) 10 MG tablet Take 10 mg by mouth at bedtime.    . Multiple Vitamin (MULTIVITAMIN) tablet Take 1 tablet by mouth daily.  . Omega-3 Fatty Acids (OMEGA 3 PO) Take 1 capsule by mouth daily.    Marland Kitchen PROAIR  HFA 108 (90 BASE) MCG/ACT inhaler Inhale 2 puffs into the lungs every 6 (six) hours as needed for wheezing or shortness of breath.   . rosuvastatin (CRESTOR) 5 MG tablet TAKE 1 TABLET (5 MG TOTAL) BY MOUTH DAILY AT 6 PM.  . sertraline (ZOLOFT) 50 MG tablet 1/2 TABLET ONCE DAILY  . tiZANidine (ZANAFLEX) 4 MG capsule Take 2 mg by mouth as needed for muscle spasms.   Current Facility-Administered Medications for the 02/21/19 encounter (Telemedicine) with Ashwika Freels, Wonda Cheng, MD  Medication  . 0.9 %  sodium chloride infusion     Allergies:   Codeine; Lipitor [atorvastatin]; and Latex   Social History   Tobacco Use  . Smoking status: Never Smoker  . Smokeless tobacco: Never Used  Substance Use Topics  . Alcohol use: Yes    Comment: social   . Drug use: No     Family Hx: The patient's family history includes Alzheimer's disease in her maternal grandmother; Asthma in her mother; Coronary artery disease in her father; Heart attack in her father, maternal grandfather, and paternal grandfather; Heart disease in her father; Heart failure in her paternal grandfather; Hepatitis C in her brother; Hypertension in her father and mother; Lung cancer in her maternal grandfather; Pancreatic cancer in her maternal aunt; Skin cancer in her brother and sister. There is no history of Colon cancer, Colon polyps, Rectal cancer, Stomach cancer, or Breast cancer.  ROS:   Please see the history of present illness.     All other systems reviewed and are negative.   Prior CV studies:   The following studies were reviewed today:    Labs/Other Tests and Data Reviewed:    EKG:  An ECG dated Nov. 20, 2019 was personally reviewed today and demonstrated:   NSR  79 ,  NS ST abn.     Recent Labs: No results found for requested labs within last 8760 hours.   Recent Lipid Panel Lab Results  Component Value Date/Time   CHOL 137 09/26/2017 03:13 PM   TRIG 59 09/26/2017 03:13 PM   HDL 65 09/26/2017 03:13 PM    CHOLHDL 2.1 09/26/2017 03:13 PM   CHOLHDL 2.4 09/26/2016 02:14 PM   LDLCALC 60 09/26/2017 03:13 PM   LDLDIRECT 171.3 09/12/2012 09:10 AM    Wt Readings from Last 3 Encounters:  02/21/19 137 lb (62.1 kg)  11/16/18 137 lb 6.4 oz (62.3 kg)  10/18/18 141 lb 6.4 oz (64.1 kg)     Objective:    Vital Signs:  BP 121/76 (BP Location: Left Arm, Patient Position: Sitting, Cuff Size: Normal)   Pulse 69   Ht 5\' 6"  (  1.676 m)   Wt 137 lb (62.1 kg)   LMP 11/07/1980   BMI 22.11 kg/m    General:   Appears healthy,   NAD HEENT:   No obvious JVD or lymphadenopathy Resp:   Normal work of breathing,   resp rate is normal  CV :   BP and HR are normal ,  No edema Abd:   No abdomina swelling , Ext:   No clubbing, cyanosis, or edema  Neuro:   Alert and oriented x 3.   No obvious motor deficits Skin : no obvious rashes    ASSESSMENT & PLAN:    1. Chest tightness :   Has developed some worsening chest tightness and shortness of breath.  These episodes typically occur in the mid to late afternoon.  They are associated with the increased heart rate.  She thinks her heart rate is in the 90-100 range if she does not think that it goes as high as 150 or 160.  These episodes of chest tightness are described as a bandlike sensation around her chest.  They last for several minutes and resolve when she stops to rest.  We will give her a prescription of diltiazem 30 mg tablets to take in addition to her diltiazem slow release 120 mg tablets.  She is to take 1 in the afternoon and also possibly in the evening to see if this will help slow her heart rate and help with her breathing.  Ideally I would like to get back for a stress test or ischemic work-up when I see her again in 2 to 3 months.  She has an appointment May 21.  Considering doing a coronary CT angiogram versus a Lexiscan Myoview study.  We will also do an echocardiogram to follow-up with her diastolic dysfunction.  We will mail her out an event monitor  to  look for any further episodes of supraventricular tachycardia.  She will send a message via my chart in 2 to 3 weeks to let us know how she is doing.       COVID-19 Education: The signs and symptoms of COVID-19 were discussed with the patient and how to seek care for testing (follow up with PCP or arrange E-visit).  The importance of social distancing was discussed today.  Time:   Today, I have spent 21 minutes with the patient with telehealth technology) discussing the above problems  ( and 10 minutes in chart prep / note writing.     Medication Adjustments/Labs and Tests Ordered: Current medicines are reviewed at length with the patient today.  Concerns regarding medicines are outlined above.   Tests Ordered: No orders of the defined types were placed in this encounter.   Medication Changes: No orders of the defined types were placed in this encounter.   Disposition:  Follow up in 3 month(s)  Signed, Mertie Moores, MD  02/21/2019 9:06 AM    [facility]

## 2019-02-21 NOTE — Telephone Encounter (Signed)
Patient enrolled for [employer] to ship a 30 day cardiac event monitor to her home.  [employer] will call today to confirm shipping address.  Briefly review monitor instructions as they are included in the monitor kit.  Patient will need to call [employer] when she is ready to apply monitor to send baseline recording.

## 2019-02-25 ENCOUNTER — Ambulatory Visit (INDEPENDENT_AMBULATORY_CARE_PROVIDER_SITE_OTHER): Payer: BLUE CROSS/BLUE SHIELD

## 2019-02-25 DIAGNOSIS — R000 Tachycardia, unspecified: Secondary | ICD-10-CM

## 2019-02-25 DIAGNOSIS — I471 Supraventricular tachycardia, unspecified: Secondary | ICD-10-CM

## 2019-02-25 DIAGNOSIS — R0602 Shortness of breath: Secondary | ICD-10-CM | POA: Diagnosis not present

## 2019-02-27 ENCOUNTER — Encounter: Payer: Self-pay | Admitting: Gastroenterology

## 2019-03-11 DIAGNOSIS — N3020 Other chronic cystitis without hematuria: Secondary | ICD-10-CM | POA: Diagnosis not present

## 2019-03-11 DIAGNOSIS — R300 Dysuria: Secondary | ICD-10-CM | POA: Diagnosis not present

## 2019-03-11 DIAGNOSIS — N952 Postmenopausal atrophic vaginitis: Secondary | ICD-10-CM | POA: Diagnosis not present

## 2019-03-15 ENCOUNTER — Telehealth: Payer: Self-pay

## 2019-03-15 NOTE — Telephone Encounter (Signed)
Spoke with pt about upcoming appt and she will be doing a [email or web address] video visit. She had previously given consent on 02/20/2019. She will have her BP, HR, and weight ready for visit.

## 2019-03-21 ENCOUNTER — Other Ambulatory Visit: Payer: BLUE CROSS/BLUE SHIELD

## 2019-03-28 ENCOUNTER — Ambulatory Visit: Payer: BLUE CROSS/BLUE SHIELD | Admitting: Cardiovascular Disease

## 2019-03-28 ENCOUNTER — Telehealth: Payer: BLUE CROSS/BLUE SHIELD | Admitting: Cardiovascular Disease

## 2019-03-29 ENCOUNTER — Other Ambulatory Visit: Payer: Self-pay

## 2019-04-01 NOTE — Progress Notes (Signed)
Virtual Visit via Video Note   This visit type was conducted due to national recommendations for restrictions regarding the COVID-19 Pandemic (e.g. social distancing) in an effort to limit this patient's exposure and mitigate transmission in our community.  Due to her co-morbid illnesses, this patient is at least at moderate risk for complications without adequate follow up.  This format is felt to be most appropriate for this patient at this time.  All issues noted in this document were discussed and addressed.  A limited physical exam was performed with this format.  Please refer to the patient's chart for her consent to telehealth for [facility].   Date:  04/02/2019   ID:  Lauren Montes, DOB [birth year], MRN [id number]  Patient Location: Home Provider Location: Office  PCP:  Haywood Pao, MD Cardiologist:  Mertie Moores, MD  Electrophysiologist:  None   Evaluation Performed:  Follow-Up Visit  1. Hyperlipidemia 2. Sinus tachycardia, SVT 3. Chronic asthma     [name] is a middle-age female the history of mild diastolic dysfunction as well as leg edema. She also has a history of hypercholesterolemia.   Has a history of hyperlipidemia. We have had her on Crestor the past which she tolerated very well. Her insurance and he refused to pay for to try her on atorvastatin. This caused her to have lots of muscle aches as well as memory loss. We discontinued the atorvastatin at that time.  She has been recording her heart rate and blood pressure readings. Her heart rate is frequently above 100. He also has many readings in the 80-90 range.  Has bruising particularly on her hands and on her fingers. [name] never had any easy bleeding. She did not have any bleeding  complications. during her pregnancies or deliveries.  Feb. 4, 2014: Lauren Montes is doing well.  Her HR and BP have been well controlled.   June 17, 2013:  Lauren Montes is doing well.  She has started waking over the  past several weeks.    June 21, 2014:  Lauren Montes is having some issues with DOE.    Perhaps slight chest pressure with exertion.  No pain. .  Sleeps on 2 pillows occasionally at night - not every night.  [name] very careful with her salt intake. She and her husband thinks most of her meals of scratch. She denies any leg swelling   Nov. 17, 2015:  Lauren Montes is doing well.  She is seen for chronic dyspnea. Her echo was normal.  myoview showed no ischemia Occasional heart racing.  Not severe.  Does not take any meds.  Resolves after several minutes. She is walking for exercise - 30-45 minutes 3 times a week.  Also do some strength training  Nov. 17, 2016: Overall doing ok Lots of stress - aging parents  Lots of muscle cramps in legs and feet.   Feb. 27, 2017:  Lauren Montes is seen back  Cholesterol is back up - she was not able to takes atorvastatin or Pravachol because of severe muscle aches and cramping  And mental foggyness. She has  Tried  Crestor in the past and it caused muscle cramps as well . [name] now on Zetia.  May 18 , 2017:  Lauren Montes is seen today for a work in visit.  She has been having lots of swelling in her feet and hands ( feet > hands)  We gave her Lasix and Kdur to take for several days.  She took the Lasix and Kdur for 2  days .   Has not needed it sense  has had some fatigue  Has not really eaten any more salt.  This has been occuring off and of for the past 6 weeks.  Is having palpitations at night  She has had increased anxiety.  Still able to do all of her usual activities without dyspnea.   She recently stopped taking her low dose estrogen ( 2-3 weeks ago )   Nov. 20, 2017:     No issues,  No SVT Takes 1/2 zoloft a day - anxiety may have been plaing a part  Has really cut down on the cholesterol in her diet  Getting exercise. Has lost 20 lbs.  Would like to decrease the crestor to every other day instead of daily .  Nov. 20, 2018  Doing great  No significant  Tachycardia Has greatly reduced her meat intake.   Has a seafood allergy Eats some chicken.    Lots of beans .     Nov. 20, 2019  Lauren Montes is seen today  Doing very well  BP and HR are well controlled.  Has occasional episodes of tachycardia , esp at night or if she gets overexerted. Has not been exercising  Seems to occur daily  Always when she lies down at night   Some of her symptoms sound like volume depletion She drinks water all day long but then says that she has heart pounding frequently through the day.  She is having issues with her IBS .  Has not been eating much protein    Chief Complaint:  SVT   Apr 02, 2019   Lauren Montes is a 63 y.o. female with sinus tach and episodes of SVT . Has occasional episdoes of tachycardia Has hyperlipidemia.  Takes crestor 5 times a week ( get muscle aches with more than than)   Labs from her primary medical doctor reveals a total cholesterol of 130.  The HDL is 48.  The LDL is 74.  The triglyceride level 64. She walks several times a week usually in the evening.  We performed an event monitor on her.  She did not have any significant episodes of SVT and it did not have any atrial fibrillation.  She did have episodes of sinus tachycardia that typically occurred at 6 PM in the evenings.  She admitted that is when she walks or does other exercises.  We discussed the appropriate time for her to take an extra diltiazem 30 mg tablet.  Staying inside,  Wears her mask     The patient does not have symptoms concerning for COVID-19 infection (fever, chills, cough, or new shortness of breath).    Past Medical History:  Diagnosis Date  . Asthma   . Atrophic vaginitis   . Blood transfusion without reported diagnosis 1975   with vaginal delivery--very anemic  . Cystic teratoma   . Elevated cholesterol   . Endometriosis   . Esophageal ulcer   . Fibromyalgia   . Fundic gland polyps of stomach, benign   . GERD  (gastroesophageal reflux disease)   . History of diastolic dysfunction   . History of migraine headaches   . Leg edema    left leg  . Ocular rosacea   . Osteoarthritis    back,hands, knees  . Overactive bladder   . Raynaud's disease   . Tachycardia   . Tubular adenoma of colon 08/2016   Past Surgical History:  Procedure Laterality Date  . APPENDECTOMY    .  BACK SURGERY    . BREAST EXCISIONAL BIOPSY Left   . BREAST SURGERY  10-09-13   breast reduction--Dr. Dessie Coma  . BUNIONECTOMY     bilateral  . CARDIOVASCULAR STRESS TEST  07/02/2008   EF 55-60%  . colonscopy    . DENTAL SURGERY  2007  . ESOPHAGOGASTRODUODENOSCOPY ENDOSCOPY    . FOOT SURGERY     LEFT FOOT  . left breast mass removed    . OOPHORECTOMY  1982   RSO partial and LSO, uterus  . REDUCTION MAMMAPLASTY Bilateral   . RSO  2002   partial  . ruptured disc surgery  07,08  . TOTAL ABDOMINAL HYSTERECTOMY       Current Meds  Medication Sig  . cycloSPORINE (RESTASIS) 0.05 % ophthalmic emulsion Place 1 drop into both eyes 2 (two) times daily.  Marland Kitchen dicyclomine (BENTYL) 10 MG capsule Take 10 mg by mouth as needed for spasms.  Marland Kitchen diltiazem (CARDIZEM CD) 120 MG 24 hr capsule TAKE 1 CAPSULE BY MOUTH EVERY DAY  . diltiazem (CARDIZEM) 30 MG tablet Take 1 tablet (30 mg total) by mouth 4 (four) times daily as needed.  . DULERA 200-5 MCG/ACT AERO Inhale 2 puffs into the lungs 2 (two) times daily.  . fexofenadine (ALLEGRA) 180 MG tablet Take 180 mg by mouth daily.  Marland Kitchen gabapentin (NEURONTIN) 300 MG capsule TAKE 1 CAPSULE BY MOUTH EVERYDAY AT BEDTIME  . montelukast (SINGULAIR) 10 MG tablet Take 10 mg by mouth at bedtime.    . Multiple Vitamin (MULTIVITAMIN) tablet Take 1 tablet by mouth daily.  . Omega-3 Fatty Acids (OMEGA 3 PO) Take 1 capsule by mouth daily.    Marland Kitchen PROAIR HFA 108 (90 BASE) MCG/ACT inhaler Inhale 2 puffs into the lungs every 6 (six) hours as needed for wheezing or shortness of breath.   . rosuvastatin (CRESTOR) 5 MG  tablet Take 5 mg by mouth. Five days a week (Mon-Fri)  . sertraline (ZOLOFT) 50 MG tablet 1/2 TABLET ONCE DAILY  . tiZANidine (ZANAFLEX) 4 MG capsule Take 2 mg by mouth as needed for muscle spasms.   Current Facility-Administered Medications for the 04/02/19 encounter (Telemedicine) with Meilech Virts, Wonda Cheng, MD  Medication  . 0.9 %  sodium chloride infusion     Allergies:   Codeine; Lipitor [atorvastatin]; and Latex   Social History   Tobacco Use  . Smoking status: Never Smoker  . Smokeless tobacco: Never Used  Substance Use Topics  . Alcohol use: Yes    Comment: social   . Drug use: No     Family Hx: The patient's family history includes Alzheimer's disease in her maternal grandmother; Asthma in her mother; Coronary artery disease in her father; Heart attack in her father, maternal grandfather, and paternal grandfather; Heart disease in her father; Heart failure in her paternal grandfather; Hepatitis C in her brother; Hypertension in her father and mother; Lung cancer in her maternal grandfather; Pancreatic cancer in her maternal aunt; Skin cancer in her brother and sister. There is no history of Colon cancer, Colon polyps, Rectal cancer, Stomach cancer, or Breast cancer.  ROS:   Please see the history of present illness.     All other systems reviewed and are negative.   Prior CV studies:   The following studies were reviewed today:    Labs/Other Tests and Data Reviewed:    EKG:  No ECG reviewed.  Recent Labs: No results found for requested labs within last 8760 hours.   Recent Lipid Panel Lab  Results  Component Value Date/Time   CHOL 137 09/26/2017 03:13 PM   TRIG 59 09/26/2017 03:13 PM   HDL 65 09/26/2017 03:13 PM   CHOLHDL 2.1 09/26/2017 03:13 PM   CHOLHDL 2.4 09/26/2016 02:14 PM   LDLCALC 60 09/26/2017 03:13 PM   LDLDIRECT 171.3 09/12/2012 09:10 AM    Wt Readings from Last 3 Encounters:  04/02/19 136 lb (61.7 kg)  02/21/19 137 lb (62.1 kg)  11/16/18 137 lb  6.4 oz (62.3 kg)     Objective:    Vital Signs:  BP 119/71 (BP Location: Left Arm, Patient Position: Sitting, Cuff Size: Normal)   Pulse 81   Ht 5' 5.5" (1.664 m)   Wt 136 lb (61.7 kg)   LMP 11/07/1980   BMI 22.29 kg/m    VITAL SIGNS:  reviewed GEN:  no acute distress EYES:  sclerae anicteric, EOMI - Extraocular Movements Intact RESPIRATORY:  normal respiratory effort, symmetric expansion CARDIOVASCULAR:  no peripheral edema SKIN:  no rash, lesions or ulcers. MUSCULOSKELETAL:  no obvious deformities. NEURO:  alert and oriented x 3, no obvious focal deficit PSYCH:  normal affect  ASSESSMENT & PLAN:    1. Supraventricular tachycardia: That he had a recent event monitor that did not reveal any episodes of atrial fibrillation or SVT.  She did have episodes of sinus tachycardia that typically occurred in the early evening.  These typically correlated to her evening walks.  We reviewed the times when she would need to take an extra Cardizem type I will see her in the office back in 6 months..  2.  Hyperlipidemia: Recent labs from Dr. Loren Racer office look great.  Continue current dose rosuvastatin.  COVID-19 Education: The signs and symptoms of COVID-19 were discussed with the patient and how to seek care for testing (follow up with PCP or arrange E-visit).  The importance of social distancing was discussed today.  Time:   Today, I have spent  18  minutes with the patient with telehealth technology discussing the above problems.     Medication Adjustments/Labs and Tests Ordered: Current medicines are reviewed at length with the patient today.  Concerns regarding medicines are outlined above.   Tests Ordered: No orders of the defined types were placed in this encounter.   Medication Changes: No orders of the defined types were placed in this encounter.   Disposition:  Follow up in 6 month(s)  Signed, Mertie Moores, MD  04/02/2019 5:20 PM    [facility]

## 2019-04-02 ENCOUNTER — Other Ambulatory Visit: Payer: Self-pay

## 2019-04-02 ENCOUNTER — Telehealth (INDEPENDENT_AMBULATORY_CARE_PROVIDER_SITE_OTHER): Payer: BLUE CROSS/BLUE SHIELD | Admitting: Cardiovascular Disease

## 2019-04-02 ENCOUNTER — Encounter: Payer: Self-pay | Admitting: Cardiovascular Disease

## 2019-04-02 VITALS — BP 119/71 | HR 81 | Ht 65.5 in | Wt 136.0 lb

## 2019-04-02 DIAGNOSIS — Z7189 Other specified counseling: Secondary | ICD-10-CM

## 2019-04-02 DIAGNOSIS — I471 Supraventricular tachycardia: Secondary | ICD-10-CM | POA: Diagnosis not present

## 2019-04-02 DIAGNOSIS — R002 Palpitations: Secondary | ICD-10-CM

## 2019-04-02 DIAGNOSIS — J45909 Unspecified asthma, uncomplicated: Secondary | ICD-10-CM

## 2019-04-02 DIAGNOSIS — I10 Essential (primary) hypertension: Secondary | ICD-10-CM

## 2019-04-02 DIAGNOSIS — E785 Hyperlipidemia, unspecified: Secondary | ICD-10-CM

## 2019-04-02 NOTE — Patient Instructions (Signed)

## 2019-04-04 ENCOUNTER — Ambulatory Visit (INDEPENDENT_AMBULATORY_CARE_PROVIDER_SITE_OTHER): Payer: BLUE CROSS/BLUE SHIELD | Admitting: Obstetrics and Gynecology

## 2019-04-04 ENCOUNTER — Encounter: Payer: Self-pay | Admitting: Obstetrics and Gynecology

## 2019-04-04 ENCOUNTER — Other Ambulatory Visit: Payer: Self-pay

## 2019-04-04 VITALS — BP 108/60 | HR 80 | Temp 97.8°F | Resp 12 | Ht 65.0 in | Wt 136.0 lb

## 2019-04-04 DIAGNOSIS — R3989 Other symptoms and signs involving the genitourinary system: Secondary | ICD-10-CM | POA: Diagnosis not present

## 2019-04-04 DIAGNOSIS — Z01419 Encounter for gynecological examination (general) (routine) without abnormal findings: Secondary | ICD-10-CM | POA: Diagnosis not present

## 2019-04-04 DIAGNOSIS — N760 Acute vaginitis: Secondary | ICD-10-CM

## 2019-04-04 MED ORDER — ESTROGENS, CONJUGATED 0.625 MG/GM VA CREA: TOPICAL_CREAM | VAGINAL | 3 refills | Status: DC

## 2019-04-04 NOTE — Progress Notes (Signed)
63 y.o. G84P1001 Married Caucasian female here for annual exam.    Saw urology earlier this month.  She saw Dr. Tresa Moore.  Told everything was normal.   Having pelvic pressure, but less of this.  It feels pressure in the bladder area.  Her pain is specifically more on her left side internally.  Denies external pain.  Has the sensation she needs to get off her feet.  Intercourse is not possible due to pain.  She does not have spontaneous pain.  It is a gradual pain, especially with movement and being on her feet.  Rest and avoidance of intercourse may it worse.  Hot baths are not helpful. Heating pad may improve her pain.  She denies bladder pain today.   She has some itching after using Replens.  She tried OTC Monistat.  Everything doing well with respect to Covid.   PCP:  Haywood Pao, MD Dr. Bennie Dallas Dr. Cathie Olden  Patient's last menstrual period was 11/07/1980.           Sexually active: Yes.    The current method of family planning is status post hysterectomy.    Exercising: Yes.    walking, weights, and yoga Smoker:  no  Health Maintenance: Pap:  03-06-15 Neg:Neg HR HPV History of abnormal Pap:  no MMG:  09-06-18 3D Neg/density B/BiRads1 Colonoscopy:  08/2016, polyp removed --normal.  Due in 2025. BMD:   2015  Result  normal TDaP:  Will do with PCP Gardasil:   n/a HIV and Hep C: never Screening Labs: PCP   reports that she has never smoked. She has never used smokeless tobacco. She reports current alcohol use. She reports that she does not use drugs.  Past Medical History:  Diagnosis Date  . Asthma   . Atrophic vaginitis   . Blood transfusion without reported diagnosis 1975   with vaginal delivery--very anemic  . Cystic teratoma   . Elevated cholesterol   . Endometriosis   . Esophageal ulcer   . Fibromyalgia   . Fundic gland polyps of stomach, benign   . GERD (gastroesophageal reflux disease)   . History of diastolic dysfunction   . History of  migraine headaches   . Leg edema    left leg  . Ocular rosacea   . Osteoarthritis    back,hands, knees  . Overactive bladder   . Raynaud's disease   . Tachycardia   . Tubular adenoma of colon 08/2016    Past Surgical History:  Procedure Laterality Date  . APPENDECTOMY    . BACK SURGERY    . BREAST EXCISIONAL BIOPSY Left   . BREAST SURGERY  10-09-13   breast reduction--Dr. Dessie Coma  . BUNIONECTOMY     bilateral  . CARDIOVASCULAR STRESS TEST  07/02/2008   EF 55-60%  . colonscopy    . DENTAL SURGERY  2007  . ESOPHAGOGASTRODUODENOSCOPY ENDOSCOPY    . FOOT SURGERY     LEFT FOOT  . left breast mass removed    . OOPHORECTOMY  1982   RSO partial and LSO, uterus  . REDUCTION MAMMAPLASTY Bilateral   . RSO  2002   partial  . ruptured disc surgery  07,08  . TOTAL ABDOMINAL HYSTERECTOMY      Current Outpatient Medications  Medication Sig Dispense Refill  . cycloSPORINE (RESTASIS) 0.05 % ophthalmic emulsion Place 1 drop into both eyes 2 (two) times daily.    Marland Kitchen dicyclomine (BENTYL) 10 MG capsule Take 10 mg by mouth as needed for spasms.    Marland Kitchen  diltiazem (CARDIZEM CD) 120 MG 24 hr capsule TAKE 1 CAPSULE BY MOUTH EVERY DAY 90 capsule 3  . diltiazem (CARDIZEM) 30 MG tablet Take 1 tablet (30 mg total) by mouth 4 (four) times daily as needed. 60 tablet 11  . DULERA 200-5 MCG/ACT AERO Inhale 2 puffs into the lungs 2 (two) times daily.  5  . fexofenadine (ALLEGRA) 180 MG tablet Take 180 mg by mouth daily.    Marland Kitchen gabapentin (NEURONTIN) 300 MG capsule TAKE 1 CAPSULE BY MOUTH EVERYDAY AT BEDTIME 90 capsule 1  . montelukast (SINGULAIR) 10 MG tablet Take 10 mg by mouth at bedtime.      . Multiple Vitamin (MULTIVITAMIN) tablet Take 1 tablet by mouth daily.    . Omega-3 Fatty Acids (OMEGA 3 PO) Take 1 capsule by mouth daily.      Marland Kitchen PROAIR HFA 108 (90 BASE) MCG/ACT inhaler Inhale 2 puffs into the lungs every 6 (six) hours as needed for wheezing or shortness of breath.   11  . rosuvastatin (CRESTOR)  5 MG tablet Take 5 mg by mouth. Five days a week (Mon-Fri)    . sertraline (ZOLOFT) 50 MG tablet 1/2 TABLET ONCE DAILY  2  . tiZANidine (ZANAFLEX) 4 MG capsule Take 2 mg by mouth as needed for muscle spasms.     Current Facility-Administered Medications  Medication Dose Route Frequency Provider Last Rate Last Dose  . 0.9 %  sodium chloride infusion  500 mL Intravenous Continuous Ladene Artist, MD        Family History  Problem Relation Age of Onset  . Coronary artery disease Father   . Hypertension Father   . Heart disease Father   . Heart attack Father   . Asthma Mother   . Hypertension Mother   . Alzheimer's disease Maternal Grandmother   . Lung cancer Maternal Grandfather   . Heart attack Maternal Grandfather   . Heart failure Paternal Grandfather   . Heart attack Paternal Grandfather   . Skin cancer Sister        melanoma  . Hepatitis C Brother   . Skin cancer Brother        melanoma  . Pancreatic cancer Maternal Aunt   . Colon cancer Neg Hx   . Colon polyps Neg Hx   . Rectal cancer Neg Hx   . Stomach cancer Neg Hx   . Breast cancer Neg Hx     Review of Systems  Constitutional: Negative.   HENT: Negative.   Eyes: Negative.   Respiratory: Negative.   Cardiovascular: Negative.   Gastrointestinal: Negative.   Endocrine: Negative.   Genitourinary: Negative.   Musculoskeletal: Negative.   Skin: Negative.   Allergic/Immunologic: Negative.   Neurological: Negative.   Hematological: Negative.   Psychiatric/Behavioral: Negative.     Exam:   BP 108/60 (BP Location: Left Arm, Patient Position: Sitting, Cuff Size: Normal)   Pulse 80   Temp 97.8 F (36.6 C) (Temporal)   Resp 12   Ht 5\' 5"  (1.651 m)   Wt 136 lb (61.7 kg)   LMP 11/07/1980   BMI 22.63 kg/m     General appearance: alert, cooperative and appears stated age Head: Normocephalic, without obvious abnormality, atraumatic Neck: no adenopathy, supple, symmetrical, trachea midline and thyroid normal to  inspection and palpation Lungs: clear to auscultation bilaterally Breasts:consistent with bilateral reduction, no masses or tenderness, No nipple retraction or dimpling, No nipple discharge or bleeding, No axillary or supraclavicular adenopathy Heart: regular rate and rhythm  Abdomen: soft, non-tender; no masses, no organomegaly Extremities: extremities normal, atraumatic, no cyanosis or edema Skin: Skin color, texture, turgor normal. No rashes or lesions Lymph nodes: Cervical, supraclavicular, and axillary nodes normal. No abnormal inguinal nodes palpated Neurologic: Grossly normal  Pelvic: External genitalia:  no lesions              Urethra:  normal appearing urethra with no masses, tenderness or lesions              Bartholins and Skenes: normal                 Vagina: normal appearing vagina with normal color and discharge, no lesions              Cervix:  absent              Pap taken: No. Bimanual Exam:  Uterus:  Absent.  Tender to palpation on anterior vagina.               Adnexa: no mass, fullness, tenderness              Rectal exam: Yes.  .  Confirms.              Anus:  normal sphincter tone, no lesions  Chaperone was present for exam.  Assessment:   Well woman visit with normal exam. Status post TAH.  Status post RSO and status post LSO. Vaginitis.  Menopausal symptoms. On Zoloft and Neurontin.  IBS. Bladder pain.  I suspect interstitial cystitis.   Plan: Mammogram screening. Recommended self breast awareness. Pap and HR HPV as above. Guidelines for Calcium, Vitamin D, regular exercise program including cardiovascular and weight bearing exercise. Affirm. Referral to Dr. Alona Bene.  Start Premarin cream.  I discussed potential effect on breast cancer.  Coupon given.  Follow up annually and prn.   After visit summary provided.

## 2019-04-04 NOTE — Patient Instructions (Signed)

## 2019-04-05 LAB — VAGINITIS/VAGINOSIS, DNA PROBE
Candida Species: NEGATIVE
Gardnerella vaginalis: NEGATIVE
Trichomonas vaginosis: NEGATIVE

## 2019-04-05 NOTE — Progress Notes (Signed)
Office Visit Note  Patient: Lauren Montes             Date of Birth: [birth year]           MRN: [id number]             PCP: Haywood Pao, MD Referring: Haywood Pao, MD Visit Date: 04/19/2019 Occupation: @GUAROCC @  Subjective:  Left trochanteric bursitis   History of Present Illness: Lauren Montes is a 63 y.o. female with history of fibromyalgia, osteoarthritis, and Raynaud's. She takes Gabapentin 300 mg 1 capsule by mouth once daily at bedtime and Zanaflex 2 to 4 mg by mouth at bedtime as needed for muscle spasms.  She is experiencing left SI joint pain and left trochanteric bursitis.  She has not been stretching on a regular basis but plans to start.  She has been walking more for exercise recently.  She states that overall her fibromyalgia has been manageable recently.  She states her level of fatigue has been stable.  She states that she does have difficulty falling asleep and often takes her up to 4 hours to fall asleep.  She denies any joint pain or joint swelling at this time.  She denies any recent symptoms of Raynolds.  She denies any ulcerations or signs of gangrene.  She denies any recent rashes.   Activities of Daily Living:  Patient reports morning stiffness for 0 minute.   Patient Reports nocturnal pain.  Difficulty dressing/grooming: Denies Difficulty climbing stairs: Denies Difficulty getting out of chair: Denies Difficulty using hands for taps, buttons, cutlery, and/or writing: Reports  Review of Systems  Constitutional: Negative for fatigue.  HENT: Negative for mouth sores, mouth dryness and nose dryness.   Eyes: Positive for dryness. Negative for pain and visual disturbance.  Respiratory: Negative for cough, hemoptysis, shortness of breath, wheezing and difficulty breathing.   Cardiovascular: Negative for chest pain, palpitations, hypertension and swelling in legs/feet.  Gastrointestinal: Negative for abdominal pain, blood in stool, constipation and  diarrhea.  Endocrine: Negative for excessive hunger and increased urination.  Genitourinary: Negative for difficulty urinating and painful urination.  Musculoskeletal: Negative for arthralgias, joint pain, joint swelling, myalgias, muscle weakness, morning stiffness, muscle tenderness and myalgias.  Skin: Negative for color change, pallor, rash, hair loss, nodules/bumps, redness, skin tightness, ulcers and sensitivity to sunlight.  Allergic/Immunologic: Negative for susceptible to infections.  Neurological: Negative for dizziness, numbness, headaches and weakness.  Hematological: Negative for swollen glands.  Psychiatric/Behavioral: Positive for sleep disturbance. Negative for depressed mood. The patient is not nervous/anxious.     PMFS History:  Patient Active Problem List   Diagnosis Date Noted   Chest tightness 02/21/2019   SVT (supraventricular tachycardia) ([place]) 09/26/2016   HTN (hypertension) 09/24/2015   Palpitations 03/05/2015   Fibromyalgia 03/05/2015   Chronic diastolic CHF (congestive heart failure), NYHA class 2 ([place]) 06/20/2014   Sinus tachycardia 09/14/2012   Dysphagia 06/29/2012   Ocular rosacea    GERD (gastroesophageal reflux disease)    Cough 09/09/2011   Chest tightness, discomfort, or pressure 09/09/2011   Hyperlipidemia 05/10/2011   SWELLING OF LIMB 01/20/2011   GERD 02/03/2010   ABDOMINAL PAIN-EPIGASTRIC 02/03/2010   ABDOMINAL PAIN-MULTIPLE SITES 02/03/2010   CONSTIPATION 09/19/2008   ABDOMINAL PAIN, LEFT LOWER QUADRANT 09/19/2008   COLONIC POLYPS, HYPERPLASTIC, HX OF 09/19/2008    Past Medical History:  Diagnosis Date   Asthma    Atrophic vaginitis    Blood transfusion without reported diagnosis 1975   with vaginal  delivery--very anemic   Cystic teratoma    Elevated cholesterol    Endometriosis    Esophageal ulcer    Fibromyalgia    Fundic gland polyps of stomach, benign    GERD (gastroesophageal reflux disease)     History of diastolic dysfunction    History of migraine headaches    Leg edema    left leg   Ocular rosacea    Osteoarthritis    back,hands, knees   Overactive bladder    Raynaud's disease    Tachycardia    Tubular adenoma of colon 08/2016    Family History  Problem Relation Age of Onset   Coronary artery disease Father    Hypertension Father    Heart disease Father    Heart attack Father    Asthma Mother    Hypertension Mother    Alzheimer's disease Maternal Grandmother    Lung cancer Maternal Grandfather    Heart attack Maternal Grandfather    Heart failure Paternal Grandfather    Heart attack Paternal Grandfather    Skin cancer Sister        melanoma   Hepatitis C Brother    Skin cancer Brother        melanoma   Pancreatic cancer Maternal Aunt    Colon cancer Neg Hx    Colon polyps Neg Hx    Rectal cancer Neg Hx    Stomach cancer Neg Hx    Breast cancer Neg Hx    Past Surgical History:  Procedure Laterality Date   APPENDECTOMY     BACK SURGERY     BREAST EXCISIONAL BIOPSY Left    BREAST SURGERY  10-09-13   breast reduction--Dr. Holderness   BUNIONECTOMY     bilateral   CARDIOVASCULAR STRESS TEST  07/02/2008   EF 55-60%   colonscopy     DENTAL SURGERY  2007   ESOPHAGOGASTRODUODENOSCOPY ENDOSCOPY     FOOT SURGERY     LEFT FOOT   left breast mass removed     OOPHORECTOMY  1982   RSO partial and LSO, uterus   REDUCTION MAMMAPLASTY Bilateral    RSO  2002   partial   ruptured disc surgery  07,08   TOTAL ABDOMINAL HYSTERECTOMY     Social History   Social History Narrative   Not on file   Immunization History  Administered Date(s) Administered   Influenza Split 08/17/2011   Zoster Recombinat (Shingrix) 07/08/2018, 01/03/2019     Objective: Vital Signs: BP 117/69 (BP Location: Left Arm, Patient Position: Sitting, Cuff Size: Normal)    Pulse 64    Resp 12    Ht 5\' 5"  (1.651 m)    Wt 139 lb (63 kg)    LMP  11/07/1980    BMI 23.13 kg/m    Physical Exam Vitals signs and nursing note reviewed.  Constitutional:      Appearance: She is well-developed.  HENT:     Head: Normocephalic and atraumatic.  Eyes:     Conjunctiva/sclera: Conjunctivae normal.  Neck:     Musculoskeletal: Normal range of motion.  Cardiovascular:     Rate and Rhythm: Normal rate and regular rhythm.     Heart sounds: Normal heart sounds.  Pulmonary:     Effort: Pulmonary effort is normal.     Breath sounds: Normal breath sounds.  Abdominal:     General: Bowel sounds are normal.     Palpations: Abdomen is soft.  Lymphadenopathy:     Cervical: No cervical adenopathy.  Skin:    General: Skin is warm and dry.     Capillary Refill: Capillary refill takes less than 2 seconds.  Neurological:     Mental Status: She is alert and oriented to person, place, and time.  Psychiatric:        Behavior: Behavior normal.      Musculoskeletal Exam: C-spine, thoracic spine, lumbar spine good range of motion.  No midline spinal tenderness.  She has left SI joint tenderness.  Shoulder joints, elbow joints, wrist joints, MCPs, PIPs, DIPs good range of motion with no synovitis.  She has complete fist formation bilaterally.  Hip joints, knee joints, ankle joints, MCPs, PIPs, DIPs good range of motion no synovitis.  No warmth or effusion bilateral knee joints.  No tenderness or swelling of ankle joints.  She has tenderness over the left trochanteric bursa.  CDAI Exam: CDAI Score: -- Patient Global: --; Provider Global: -- Swollen: --; Tender: -- Joint Exam   No joint exam has been documented for this visit   There is currently no information documented on the homunculus. Go to the Rheumatology activity and complete the homunculus joint exam.  Investigation: No additional findings.  Imaging: No results found.  Recent Labs: Lab Results  Component Value Date   WBC 7.1 08/15/2016   HGB 13.9 08/15/2016   PLT 248.0 08/15/2016    NA 145 (H) 09/26/2017   K 4.2 09/26/2017   CL 104 09/26/2017   CO2 24 09/26/2017   GLUCOSE 79 09/26/2017   BUN 10 09/26/2017   CREATININE 0.67 09/26/2017   BILITOT 0.5 09/26/2017   ALKPHOS 91 09/26/2017   AST 27 09/26/2017   ALT 23 09/26/2017   PROT 7.1 09/26/2017   ALBUMIN 4.8 09/26/2017   CALCIUM 9.8 09/26/2017   GFRAA 110 09/26/2017    Speciality Comments: No specialty comments available.  Procedures:  No procedures performed Allergies: Codeine, Lipitor [atorvastatin], and Latex   Assessment / Plan:     Visit Diagnoses: Fibromyalgia -She continues to have mild generalized muscle aches and muscle tenderness due to fibromyalgia.  Her symptoms have been well controlled recently.  She is having some left SI joint pain and left trochanter bursitis.  She has not been performing stretching exercises on a regular basis recently.  She was encouraged to start performing more stretching exercises.  She was given several handouts of exercises to perform.  She continues have difficulty falling asleep at night.  She takes gabapentin 300 mg 1 capsule by mouth at bedtime and Zanaflex 2 to 4 mg by mouth at bedtime.  Refills of gabapentin and Zanaflex were sent to the pharmacy today.  We discussed the importance of regular exercise and good sleep hygiene.  She will follow up in 6 months.   Chronic fatigue - Stable.  Primary insomnia - She has been taking gabapentin 300 mg by mouth at bedtime.  She takes Zanaflex 2 to 4 mg by mouth at bedtime to help with muscle spasms.  She has difficulty falling asleep.  Most nights it takes her up to 4 hours to fall asleep.  Good sleep hygiene was discussed.  Refills of gabapentin and Zanaflex were sent to the pharmacy.  DDD (degenerative disc disease), lumbar -she has good range of motion with no discomfort.  No midline spinal tenderness.  She is given a handout of back exercises and core strengthening exercises to perform.  Raynaud's disease without gangrene  -Her symptoms of Raynaud's have improved.  She has no digital ulcerations or  signs of gangrene at this time.  Capillary refill is 2 to 3 seconds.  She is advised to notify us if she develops any new or worsening symptoms.  Trochanteric bursitis of left hip -She has tenderness over the left trochanteric bursa.  She was encouraged to perform stretching exercises on a regular basis.  She is given a handout of exercises to perform.  She declined a cortisone injection at this time.   Other medical conditions are listed as follows:  History of gastroesophageal reflux (GERD)   History of CHF (congestive heart failure)  History of hypercholesterolemia   Ocular rosacea   History of asthma  History of migraine   Orders: No orders of the defined types were placed in this encounter.  Meds ordered this encounter  Medications   gabapentin (NEURONTIN) 300 MG capsule    Sig: TAKE 1 CAPSULE BY MOUTH EVERYDAY AT BEDTIME    Dispense:  90 capsule    Refill:  1   tiZANidine (ZANAFLEX) 4 MG tablet    Sig: Take 1 tablet (4 mg total) by mouth at bedtime.    Dispense:  30 tablet    Refill:  0    Face-to-face time spent with patient was 30 minutes. Greater than 50% of time was spent in counseling and coordination of care.  Follow-Up Instructions: Return in about 6 months (around 10/19/2019) for Fibromyalgia, Osteoarthritis, Raynaud's syndrome.   Ofilia Neas, PA-C   I examined and evaluated the patient with Hazel Sams PA. Patient continues to have generalized discomfort. I did not notice any joint inflammation.The plan of care was discussed as noted above.  Bo Merino, MD  Note - This record has been created using Editor, commissioning.  Chart creation errors have been sought, but may not always  have been located. Such creation errors do not reflect on  the standard of medical care.

## 2019-04-19 ENCOUNTER — Ambulatory Visit (INDEPENDENT_AMBULATORY_CARE_PROVIDER_SITE_OTHER): Payer: BC Managed Care – PPO | Admitting: Rheumatology

## 2019-04-19 ENCOUNTER — Encounter: Payer: Self-pay | Admitting: Rheumatology

## 2019-04-19 ENCOUNTER — Other Ambulatory Visit: Payer: Self-pay

## 2019-04-19 VITALS — BP 117/69 | HR 64 | Resp 12 | Ht 65.0 in | Wt 139.0 lb

## 2019-04-19 DIAGNOSIS — F5101 Primary insomnia: Secondary | ICD-10-CM | POA: Diagnosis not present

## 2019-04-19 DIAGNOSIS — M7062 Trochanteric bursitis, left hip: Secondary | ICD-10-CM

## 2019-04-19 DIAGNOSIS — M5136 Other intervertebral disc degeneration, lumbar region: Secondary | ICD-10-CM | POA: Diagnosis not present

## 2019-04-19 DIAGNOSIS — Z8639 Personal history of other endocrine, nutritional and metabolic disease: Secondary | ICD-10-CM

## 2019-04-19 DIAGNOSIS — R5382 Chronic fatigue, unspecified: Secondary | ICD-10-CM

## 2019-04-19 DIAGNOSIS — Z8709 Personal history of other diseases of the respiratory system: Secondary | ICD-10-CM

## 2019-04-19 DIAGNOSIS — M797 Fibromyalgia: Secondary | ICD-10-CM | POA: Diagnosis not present

## 2019-04-19 DIAGNOSIS — Z8719 Personal history of other diseases of the digestive system: Secondary | ICD-10-CM

## 2019-04-19 DIAGNOSIS — L718 Other rosacea: Secondary | ICD-10-CM

## 2019-04-19 DIAGNOSIS — Z8679 Personal history of other diseases of the circulatory system: Secondary | ICD-10-CM

## 2019-04-19 DIAGNOSIS — Z8669 Personal history of other diseases of the nervous system and sense organs: Secondary | ICD-10-CM

## 2019-04-19 DIAGNOSIS — I7300 Raynaud's syndrome without gangrene: Secondary | ICD-10-CM

## 2019-04-19 MED ORDER — TIZANIDINE HCL 4 MG PO TABS: 4.0000 mg | ORAL_TABLET | Freq: Every day | ORAL | 0 refills | Status: DC

## 2019-04-19 MED ORDER — GABAPENTIN 300 MG PO CAPS: ORAL_CAPSULE | ORAL | 1 refills | Status: DC

## 2019-04-19 NOTE — Patient Instructions (Signed)
Sacroiliac Joint Dysfunction  Sacroiliac joint dysfunction is a condition that causes inflammation on one or both sides of the sacroiliac (SI) joint. The SI joint connects the lower part of the spine (sacrum) with the two upper portions of the pelvis (ilium). This condition causes deep aching or burning pain in the low back. In some cases, the pain may also spread into one or both buttocks, hips, or thighs. What are the causes? This condition may be caused by:  Pregnancy. During pregnancy, extra stress is put on the SI joints because the pelvis widens.  Injury, such as: ? Injuries from car accidents. ? Sports-related injuries. ? Work-related injuries.  Having one leg that is shorter than the other.  Conditions that affect the joints, such as: ? Rheumatoid arthritis. ? Gout. ? Psoriatic arthritis. ? Joint infection (septic arthritis). Sometimes, the cause of SI joint dysfunction is not known. What are the signs or symptoms? Symptoms of this condition include:  Aching or burning pain in the lower back. The pain may also spread to other areas, such as: ? Buttocks. ? Groin. ? Thighs.  Muscle spasms in or around the painful areas.  Increased pain when standing, walking, running, stair climbing, bending, or lifting. How is this diagnosed? This condition is diagnosed with a physical exam and medical history. During the exam, the health care provider may move one or both of your legs to different positions to check for pain. Various tests may be done to confirm the diagnosis, including:  Imaging tests to look for other causes of pain. These may include: ? MRI. ? CT scan. ? Bone scan.  Diagnostic injection. A numbing medicine is injected into the SI joint using a needle. If your pain is temporarily improved or stopped after the injection, this can indicate that SI joint dysfunction is the problem. How is this treated? Treatment depends on the cause and severity of your condition.  Treatment options may include:  Ice or heat applied to the lower back area after an injury. This may help reduce pain and muscle spasms.  Medicines to relieve pain or inflammation or to relax the muscles.  Wearing a back brace (sacroiliac brace) to help support the joint while your back is healing.  Physical therapy to increase muscle strength around the joint and flexibility at the joint. This may also involve learning proper body positions and ways of moving to relieve stress on the joint.  Direct manipulation of the SI joint.  Injections of steroid medicine into the joint to reduce pain and swelling.  Radiofrequency ablation to burn away nerves that are carrying pain messages from the joint.  Use of a device that provides electrical stimulation to help reduce pain at the joint.  Surgery to put in screws and plates that limit or prevent joint motion. This is rare. Follow these instructions at home: Medicines  Take over-the-counter and prescription medicines only as told by your health care provider.  Do not drive or use heavy machinery while taking prescription pain medicine.  If you are taking prescription pain medicine, take actions to prevent or treat constipation. Your health care provider may recommend that you: ? Drink enough fluid to keep your urine pale yellow. ? Eat foods that are high in fiber, such as fresh fruits and vegetables, whole grains, and beans. ? Limit foods that are high in fat and processed sugars, such as fried or sweet foods. ? Take an over-the-counter or prescription medicine for constipation. If you have a brace:    Wear the brace as told by your health care provider. Remove it only as told by your health care provider.  Keep the brace clean.  If the brace is not waterproof: ? Do not let it get wet. ? Cover it with a watertight covering when you take a bath or a shower. Managing pain, stiffness, and swelling      Icing can help with pain and  swelling. Heat may help with muscle tension or spasms. Ask your health care provider if you should use ice or heat.  If directed, put ice on the affected area: ? If you have a removable brace, remove it as told by your health care provider. ? Put ice in a plastic bag. ? Place a towel between your skin and the bag. ? Leave the ice on for 20 minutes, 2-3 times a day.  If directed, apply heat to the affected area. Use the heat source that your health care provider recommends, such as a moist heat pack or a heating pad. ? Place a towel between your skin and the heat source. ? Leave the heat on for 20-30 minutes. ? Remove the heat if your skin turns bright red. This is especially important if you are unable to feel pain, heat, or cold. You may have a greater risk of getting burned. General instructions  Rest as needed. Ask your health care provider what activities are safe for you.  Return to your normal activities as told by your health care provider.  Exercise as directed by your health care provider or physical therapist.  Do not use any products that contain nicotine or tobacco, such as cigarettes and e-cigarettes. These can delay bone healing. If you need help quitting, ask your health care provider.  Keep all follow-up visits as told by your health care provider. This is important. Contact a health care provider if:  Your pain is not controlled with medicine.  You have a fever.  Your pain is getting worse. Get help right away if:  You have weakness, numbness, or tingling in your legs or feet.  You lose control of your bladder or bowel. Summary  Sacroiliac joint dysfunction is a condition that causes inflammation on one or both sides of the sacroiliac (SI) joint.  This condition causes deep aching or burning pain in the low back. In some cases, the pain may also spread into one or both buttocks, hips, or thighs.  Treatment depends on the cause and severity of your condition.  It may include medicines to reduce pain and swelling or to relax muscles. This information is not intended to replace advice given to you by your health care provider. Make sure you discuss any questions you have with your health care provider. Document Released: 01/20/2009 Document Revised: 12/04/2017 Document Reviewed: 12/04/2017 Elsevier Interactive Patient Education  2019 [place]. Trochanteric Bursitis Rehab Ask your health care provider which exercises are safe for you. Do exercises exactly as told by your health care provider and adjust them as directed. It is normal to feel mild stretching, pulling, tightness, or discomfort as you do these exercises, but you should stop right away if you feel sudden pain or your pain gets worse.Do not begin these exercises until told by your health care provider. Stretching exercises These exercises warm up your muscles and joints and improve the movement and flexibility of your hip. These exercises also help to relieve pain and stiffness. Exercise A: Iliotibial band stretch  1. Lie on your side with your  left / right leg in the top position. 2. Bend your left / right knee and grab your ankle. 3. Slowly bring your knee back so your thigh is behind your body. 4. Slowly lower your knee toward the floor until you feel a gentle stretch on the outside of your left / right thigh. If you do not feel a stretch and your knee will not fall farther, place the heel of your other foot on top of your outer knee and pull your thigh down farther. 5. Hold this position for __________ seconds. 6. Slowly return to the starting position. Repeat __________ times. Complete this exercise __________ times a day. Strengthening exercises These exercises build strength and endurance in your hip and pelvis. Endurance is the ability to use your muscles for a long time, even after they get tired. Exercise B: Bridge (hip extensors)  1. Lie on your back on a firm surface with  your knees bent and your feet flat on the floor. 2. Tighten your buttocks muscles and lift your buttocks off the floor until your trunk is level with your thighs. You should feel the muscles working in your buttocks and the back of your thighs. If this exercise is too easy, try doing it with your arms crossed over your chest. 3. Hold this position for __________ seconds. 4. Slowly return to the starting position. 5. Let your muscles relax completely between repetitions. Repeat __________ times. Complete this exercise __________ times a day. Exercise C: Squats (knee extensors and  quadriceps) 1. Stand in front of a table, with your feet and knees pointing straight ahead. You may rest your hands on the table for balance but not for support. 2. Slowly bend your knees and lower your hips like you are going to sit in a chair. ? Keep your weight over your heels, not over your toes. ? Keep your lower legs upright so they are parallel with the table legs. ? Do not let your hips go lower than your knees. ? Do not bend lower than told by your health care provider. ? If your hip pain increases, do not bend as low. 3. Hold this position for __________ seconds. 4. Slowly push with your legs to return to standing. Do not use your hands to pull yourself to standing. Repeat __________ times. Complete this exercise __________ times a day. Exercise D: Hip hike 1. Stand sideways on a bottom step. Stand on your left / right leg with your other foot unsupported next to the step. You can hold onto the railing or wall if needed for balance. 2. Keeping your knees straight and your torso square, lift your left / right hip up toward the ceiling. 3. Hold this position for __________ seconds. 4. Slowly let your left / right hip lower toward the floor, past the starting position. Your foot should get closer to the floor. Do not lean or bend your knees. Repeat __________ times. Complete this exercise __________ times a day.  Exercise E: Single leg stand 1. Stand near a counter or door frame that you can hold onto for balance as needed. It is helpful to stand in front of a mirror for this exercise so you can watch your hip. 2. Squeeze your left / right buttock muscles then lift up your other foot. Do not let your left / right hip push out to the side. 3. Hold this position for __________ seconds. Repeat __________ times. Complete this exercise __________ times a day. This information is not intended  to replace advice given to you by your health care provider. Make sure you discuss any questions you have with your health care provider. Document Released: 12/01/2004 Document Revised: 06/30/2016 Document Reviewed: 10/09/2015 Elsevier Interactive Patient Education  2019 [place]. Core Strength Exercises  Core exercises help to build strength in the muscles between your ribs and your hips (abdominal muscles). These muscles help to support your body and keep your spine stable. It is important to maintain strength in your core to prevent injury and pain. Some activities, such as yoga and Pilates, can help to strengthen core muscles. You can also strengthen core muscles with exercises at home. It is important to talk to your health care provider before you start a new exercise routine. What are the benefits of core strength exercises? Core strength exercises can:  Reduce back pain.  Help to rebuild strength after a back or spine injury.  Help to prevent injury during physical activity, especially injuries to the back and knees. How to do core strength exercises Repeat these exercises 10-15 times, or until you are tired. Do exercises exactly as told by your health care provider and adjust them as directed. It is normal to feel mild stretching, pulling, tightness, or discomfort as you do these exercises. If you feel any pain while doing these exercises, stop. If your pain continues or gets worse when doing core  exercises, contact your health care provider. You may want to use a padded yoga or exercise mat for strength exercises that are done on the floor. Bridging  6. Lie on your back on a firm surface with your knees bent and your feet flat on the floor. 7. Raise your hips so that your knees, hips, and shoulders form a straight line together. Keep your abdominal muscles tight. 8. Hold this position for 3-5 seconds. 9. Slowly lower your hips to the starting position. 10. Let your muscles relax completely between repetitions. Single-leg bridge 1. Lie on your back on a firm surface with your knees bent and your feet flat on the floor. 2. Raise your hips so that your knees, hips, and shoulders form a straight line together. Keep your abdominal muscles tight. 3. Lift one foot off the floor, then completely straighten that leg. 4. Hold this position for 3-5 seconds. 5. Put the straight leg back down in the bent position. 6. Slowly lower your hips to the starting position. 7. Repeat these steps using your other leg. Side bridge 5. Lie on your side with your knees bent. Prop yourself up on the elbow that is near the floor. 6. Using your abdominal muscles and your elbow that is on the floor, raise your body off the floor. Raise your hip so that your shoulder, hip, and foot form a straight line together. 7. Hold this position for 10 seconds. Keep your head and neck raised and away from your shoulder (in their normal, neutral position). Keep your abdominal muscles tight. 8. Slowly lower your hip to the starting position. 9. Repeat and try to hold this position longer, working your way up to 30 seconds. Abdominal crunch 4. Lie on your back on a firm surface. Bend your knees and keep your feet flat on the floor. 5. Cross your arms over your chest. 6. Without bending your neck, tip your chin slightly toward your chest. 7. Tighten your abdominal muscles as you lift your chest just high enough to lift your  shoulder blades off of the floor. Do not hold your breath. You  can do this with short lifts or long lifts. 8. Slowly return to the starting position. Bird dog 1. Get on your hands and knees, with your legs shoulder-width apart and your arms under your shoulders. Keep your back straight. 2. Tighten your abdominal muscles. 3. Raise one of your legs off the floor and straighten it. Try to keep it parallel to the floor. 4. Slowly lower your leg to the starting position. 5. Raise one of your arms off the floor and straighten it. Try to keep it parallel to the floor. 6. Slowly lower your arm to the starting position. 7. Repeat with the other arm and leg. If possible, try raising a leg and arm at the same time, on opposite sides of the body. For example, raise your left hand and your right leg. Plank 1. Lie on your belly. 2. Prop up your body onto your forearms and your feet, keeping your legs straight. Your body should make a straight line between your shoulders and feet. 3. Hold this position for 10 seconds while keeping your abdominal muscles tight. 4. Lower your body to the starting position. 5. Repeat and try to hold this position longer, working your way up to 30 seconds. Cross-core strengthening 1. Stand with your feet shoulder-width apart. 2. Hold a ball out in front of you. Keep your arms straight. 3. Tighten your abdominal muscles and slowly rotate at your waist from side to side. Keep your feet flat. 4. Once you are comfortable, try repeating this exercise with a heavier ball. Top core strengthening 1. Stand about 18 inches (46 cm) in front of a wall, with your back to the wall. 2. Keep your feet flat and shoulder-width apart. 3. Tighten your abdominal muscles. 4. Bend your hips and knees. 5. Slowly reach between your legs to touch the wall behind you. 6. Slowly stand back up. 7. Raise your arms over your head and reach behind you. 8. Return to the starting position. General tips   Do not do any exercises that cause pain. If you have pain while exercising, talk to your health care provider.  Always stretch before and after doing these exercises. This can help prevent injury.  Maintain a healthy weight. Ask your health care provider what weight is healthy for you. Contact a health care provider if:  You have back pain that gets worse or does not go away.  You feel pain while doing core strength exercises. Get help right away if:  You have severe pain that does not get better with medicine. Summary  Core exercises help to build strength in the muscles between your ribs and your waist.  Core muscles help to support your body and keep your spine stable.  Some activities, such as yoga and Pilates, can help to strengthen core muscles.  Core strength exercises can help back pain and can prevent injury.  If you feel any pain while doing core strength exercises, stop. This information is not intended to replace advice given to you by your health care provider. Make sure you discuss any questions you have with your health care provider. Document Released: 03/15/2017 Document Revised: 03/15/2017 Document Reviewed: 03/15/2017 Elsevier Interactive Patient Education  2019 [place]. Back Exercises If you have pain in your back, do these exercises 2-3 times each day or as told by your doctor. When the pain goes away, do the exercises once each day, but repeat the steps more times for each exercise (do more repetitions). If you do not have  pain in your back, do these exercises once each day or as told by your doctor. Exercises Single Knee to Chest Do these steps 3-5 times in a row for each leg: 7. Lie on your back on a firm bed or the floor with your legs stretched out. 8. Bring one knee to your chest. 9. Hold your knee to your chest by grabbing your knee or thigh. 10. Pull on your knee until you feel a gentle stretch in your lower back. 11. Keep doing the stretch for  10-30 seconds. 12. Slowly let go of your leg and straighten it. Pelvic Tilt Do these steps 5-10 times in a row: 11. Lie on your back on a firm bed or the floor with your legs stretched out. 12. Bend your knees so they point up to the ceiling. Your feet should be flat on the floor. 13. Tighten your lower belly (abdomen) muscles to press your lower back against the floor. This will make your tailbone point up to the ceiling instead of pointing down to your feet or the floor. 14. Stay in this position for 5-10 seconds while you gently tighten your muscles and breathe evenly. Cat-Cow Do these steps until your lower back bends more easily: 8. Get on your hands and knees on a firm surface. Keep your hands under your shoulders, and keep your knees under your hips. You may put padding under your knees. 9. Let your head hang down, and make your tailbone point down to the floor so your lower back is round like the back of a cat. 10. Stay in this position for 5 seconds. 11. Slowly lift your head and make your tailbone point up to the ceiling so your back hangs low (sags) like the back of a cow. 12. Stay in this position for 5 seconds.  Press-Ups Do these steps 5-10 times in a row: 10. Lie on your belly (face-down) on the floor. 11. Place your hands near your head, about shoulder-width apart. 12. While you keep your back relaxed and keep your hips on the floor, slowly straighten your arms to raise the top half of your body and lift your shoulders. Do not use your back muscles. To make yourself more comfortable, you may change where you place your hands. 13. Stay in this position for 5 seconds. 14. Slowly return to lying flat on the floor.  Bridges Do these steps 10 times in a row: 9. Lie on your back on a firm surface. 10. Bend your knees so they point up to the ceiling. Your feet should be flat on the floor. 11. Tighten your butt muscles and lift your butt off of the floor until your waist is almost  as high as your knees. If you do not feel the muscles working in your butt and the back of your thighs, slide your feet 1-2 inches farther away from your butt. 12. Stay in this position for 3-5 seconds. 13. Slowly lower your butt to the floor, and let your butt muscles relax. If this exercise is too easy, try doing it with your arms crossed over your chest. Belly Crunches Do these steps 5-10 times in a row: 8. Lie on your back on a firm bed or the floor with your legs stretched out. 9. Bend your knees so they point up to the ceiling. Your feet should be flat on the floor. 10. Cross your arms over your chest. 11. Tip your chin a little bit toward your chest but do not  bend your neck. 12. Tighten your belly muscles and slowly raise your chest just enough to lift your shoulder blades a tiny bit off of the floor. 13. Slowly lower your chest and your head to the floor. Back Lifts Do these steps 5-10 times in a row: 6. Lie on your belly (face-down) with your arms at your sides, and rest your forehead on the floor. 7. Tighten the muscles in your legs and your butt. 8. Slowly lift your chest off of the floor while you keep your hips on the floor. Keep the back of your head in line with the curve in your back. Look at the floor while you do this. 9. Stay in this position for 3-5 seconds. 10. Slowly lower your chest and your face to the floor. Contact a doctor if:  Your back pain gets a lot worse when you do an exercise.  Your back pain does not lessen 2 hours after you exercise. If you have any of these problems, stop doing the exercises. Do not do them again unless your doctor says it is okay. Get help right away if:  You have sudden, very bad back pain. If this happens, stop doing the exercises. Do not do them again unless your doctor says it is okay. This information is not intended to replace advice given to you by your health care provider. Make sure you discuss any questions you have with  your health care provider. Document Released: 11/26/2010 Document Revised: 07/18/2018 Document Reviewed: 12/18/2014 Elsevier Interactive Patient Education  [employer].

## 2019-05-02 ENCOUNTER — Other Ambulatory Visit: Payer: BLUE CROSS/BLUE SHIELD

## 2019-05-08 DIAGNOSIS — R3989 Other symptoms and signs involving the genitourinary system: Secondary | ICD-10-CM | POA: Diagnosis not present

## 2019-05-08 DIAGNOSIS — N941 Unspecified dyspareunia: Secondary | ICD-10-CM | POA: Diagnosis not present

## 2019-06-04 DIAGNOSIS — B9620 Unspecified Escherichia coli [E. coli] as the cause of diseases classified elsewhere: Secondary | ICD-10-CM | POA: Diagnosis not present

## 2019-06-04 DIAGNOSIS — R3989 Other symptoms and signs involving the genitourinary system: Secondary | ICD-10-CM | POA: Diagnosis not present

## 2019-06-04 DIAGNOSIS — R82998 Other abnormal findings in urine: Secondary | ICD-10-CM | POA: Diagnosis not present

## 2019-06-10 DIAGNOSIS — H43813 Vitreous degeneration, bilateral: Secondary | ICD-10-CM | POA: Diagnosis not present

## 2019-06-10 DIAGNOSIS — H5213 Myopia, bilateral: Secondary | ICD-10-CM | POA: Diagnosis not present

## 2019-06-10 DIAGNOSIS — H25813 Combined forms of age-related cataract, bilateral: Secondary | ICD-10-CM | POA: Diagnosis not present

## 2019-06-10 DIAGNOSIS — H04123 Dry eye syndrome of bilateral lacrimal glands: Secondary | ICD-10-CM | POA: Diagnosis not present

## 2019-07-10 DIAGNOSIS — M797 Fibromyalgia: Secondary | ICD-10-CM | POA: Diagnosis not present

## 2019-07-10 DIAGNOSIS — N819 Female genital prolapse, unspecified: Secondary | ICD-10-CM | POA: Insufficient documentation

## 2019-07-10 DIAGNOSIS — M5416 Radiculopathy, lumbar region: Secondary | ICD-10-CM | POA: Diagnosis not present

## 2019-07-10 DIAGNOSIS — J45901 Unspecified asthma with (acute) exacerbation: Secondary | ICD-10-CM | POA: Diagnosis not present

## 2019-07-10 DIAGNOSIS — R3989 Other symptoms and signs involving the genitourinary system: Secondary | ICD-10-CM | POA: Diagnosis not present

## 2019-07-10 DIAGNOSIS — K589 Irritable bowel syndrome without diarrhea: Secondary | ICD-10-CM | POA: Diagnosis not present

## 2019-07-17 ENCOUNTER — Encounter: Payer: Self-pay | Admitting: Rheumatology

## 2019-07-17 ENCOUNTER — Ambulatory Visit (INDEPENDENT_AMBULATORY_CARE_PROVIDER_SITE_OTHER): Payer: BC Managed Care – PPO | Admitting: Rheumatology

## 2019-07-17 ENCOUNTER — Other Ambulatory Visit: Payer: Self-pay

## 2019-07-17 ENCOUNTER — Ambulatory Visit: Payer: Self-pay

## 2019-07-17 VITALS — BP 110/79 | HR 91 | Ht 65.0 in | Wt 141.0 lb

## 2019-07-17 DIAGNOSIS — M7062 Trochanteric bursitis, left hip: Secondary | ICD-10-CM

## 2019-07-17 DIAGNOSIS — M797 Fibromyalgia: Secondary | ICD-10-CM

## 2019-07-17 DIAGNOSIS — Z8679 Personal history of other diseases of the circulatory system: Secondary | ICD-10-CM

## 2019-07-17 DIAGNOSIS — G8929 Other chronic pain: Secondary | ICD-10-CM

## 2019-07-17 DIAGNOSIS — Z8719 Personal history of other diseases of the digestive system: Secondary | ICD-10-CM

## 2019-07-17 DIAGNOSIS — M5136 Other intervertebral disc degeneration, lumbar region: Secondary | ICD-10-CM | POA: Diagnosis not present

## 2019-07-17 DIAGNOSIS — M51369 Other intervertebral disc degeneration, lumbar region without mention of lumbar back pain or lower extremity pain: Secondary | ICD-10-CM

## 2019-07-17 DIAGNOSIS — Z8709 Personal history of other diseases of the respiratory system: Secondary | ICD-10-CM

## 2019-07-17 DIAGNOSIS — R5382 Chronic fatigue, unspecified: Secondary | ICD-10-CM

## 2019-07-17 DIAGNOSIS — M5441 Lumbago with sciatica, right side: Secondary | ICD-10-CM

## 2019-07-17 DIAGNOSIS — F5101 Primary insomnia: Secondary | ICD-10-CM | POA: Diagnosis not present

## 2019-07-17 DIAGNOSIS — Z8639 Personal history of other endocrine, nutritional and metabolic disease: Secondary | ICD-10-CM

## 2019-07-17 DIAGNOSIS — I7300 Raynaud's syndrome without gangrene: Secondary | ICD-10-CM

## 2019-07-17 DIAGNOSIS — Z8669 Personal history of other diseases of the nervous system and sense organs: Secondary | ICD-10-CM

## 2019-07-17 DIAGNOSIS — L718 Other rosacea: Secondary | ICD-10-CM

## 2019-07-17 NOTE — Progress Notes (Signed)
Office Visit Note  Patient: Lauren Montes             Date of Birth: 08-Mar-1956           MRN: [id number]             PCP: Haywood Pao, MD Referring: Haywood Pao, MD Visit Date: 07/17/2019 Occupation: @GUAROCC @  Subjective:  Lower back pain   History of Present Illness: Lauren Montes is a 63 y.o. female with history of fibromyalgia and DDD.  She takes gabapentin 300 mg 1 capsule by mouth at bedtime and Zanaflex 4 mg by mouth at bedtime for muscle spasms.  She states that her fibromyalgia pain has been manageable recently.  She states that 1 week ago she was leaning over to get something out of her dresser and felt a sharp pain in her lower back.  She has been having severe pain for the past 1 week.  She states that 2 to 3 days after the incident she started having radiating pain down the right lower extremity.  She is also been noticing some weakness in the right lower extremity.  She denies any numbness or tingling.  She is been having difficulty getting up from a chair as well as ambulating.  She is also been having significant pain at night.  She has been taking Zanaflex 4 mg by mouth at bedtime which been relieving some of her discomfort.  She denies any muscle spasms.  She tried taking Advil and using a heating pad but did not notice any improvement.  She states that her symptoms have been unchanged over the past 1 week. Lower back pain, right sided sciatica  She states that she was recently diagnosed with interstitial cystitis and has been evaluated by Dr. Amalia Hailey.  She has scheduled for a cystoscopy in the upcoming weeks.  Activities of Daily Living:  Patient reports morning stiffness for 0 minute.   Patient Reports nocturnal pain.  Difficulty dressing/grooming: Denies Difficulty climbing stairs: Reports Difficulty getting out of chair: Reports Difficulty using hands for taps, buttons, cutlery, and/or writing: Denies  Review of Systems  Constitutional: Negative  for fatigue.  HENT: Negative for mouth sores, mouth dryness and nose dryness.   Eyes: Negative for pain, visual disturbance and dryness.  Respiratory: Negative for cough, hemoptysis, shortness of breath and difficulty breathing.   Cardiovascular: Negative for chest pain, palpitations, hypertension and swelling in legs/feet.  Gastrointestinal: Negative for blood in stool, constipation and diarrhea.  Endocrine: Negative for excessive thirst and increased urination.  Genitourinary: Negative for difficulty urinating and painful urination.  Musculoskeletal: Positive for arthralgias, joint pain and muscle tenderness. Negative for joint swelling, myalgias, muscle weakness, morning stiffness and myalgias.  Skin: Negative for color change, pallor, rash, hair loss, nodules/bumps, redness, skin tightness, ulcers and sensitivity to sunlight.  Allergic/Immunologic: Negative for susceptible to infections.  Neurological: Negative for dizziness, numbness, headaches and weakness.  Hematological: Negative for bruising/bleeding tendency and swollen glands.  Psychiatric/Behavioral: Negative for depressed mood and sleep disturbance. The patient is not nervous/anxious.     PMFS History:  Patient Active Problem List   Diagnosis Date Noted  . Chest tightness 02/21/2019  . SVT (supraventricular tachycardia) ([place]) 09/26/2016  . HTN (hypertension) 09/24/2015  . Palpitations 03/05/2015  . Fibromyalgia 03/05/2015  . Chronic diastolic CHF (congestive heart failure), NYHA class 2 ([place]) 06/20/2014  . Sinus tachycardia 09/14/2012  . Dysphagia 06/29/2012  . Ocular rosacea   . GERD (gastroesophageal reflux disease)   .  Cough 09/09/2011  . Chest tightness, discomfort, or pressure 09/09/2011  . Hyperlipidemia 05/10/2011  . SWELLING OF LIMB 01/20/2011  . GERD 02/03/2010  . ABDOMINAL PAIN-EPIGASTRIC 02/03/2010  . ABDOMINAL PAIN-MULTIPLE SITES 02/03/2010  . CONSTIPATION 09/19/2008  . ABDOMINAL PAIN, LEFT LOWER QUADRANT  09/19/2008  . COLONIC POLYPS, HYPERPLASTIC, HX OF 09/19/2008    Past Medical History:  Diagnosis Date  . Asthma   . Atrophic vaginitis   . Blood transfusion without reported diagnosis 1975   with vaginal delivery--very anemic  . Cystic teratoma   . Elevated cholesterol   . Endometriosis   . Esophageal ulcer   . Fibromyalgia   . Fundic gland polyps of stomach, benign   . GERD (gastroesophageal reflux disease)   . History of diastolic dysfunction   . History of migraine headaches   . Leg edema    left leg  . Ocular rosacea   . Osteoarthritis    back,hands, knees  . Overactive bladder   . Raynaud's disease   . Tachycardia   . Tubular adenoma of colon 08/2016    Family History  Problem Relation Age of Onset  . Coronary artery disease Father   . Hypertension Father   . Heart disease Father   . Heart attack Father   . Asthma Mother   . Hypertension Mother   . Alzheimer's disease Maternal Grandmother   . Lung cancer Maternal Grandfather   . Heart attack Maternal Grandfather   . Heart failure Paternal Grandfather   . Heart attack Paternal Grandfather   . Skin cancer Sister        melanoma  . Hepatitis C Brother   . Skin cancer Brother        melanoma  . Pancreatic cancer Maternal Aunt   . Colon cancer Neg Hx   . Colon polyps Neg Hx   . Rectal cancer Neg Hx   . Stomach cancer Neg Hx   . Breast cancer Neg Hx    Past Surgical History:  Procedure Laterality Date  . APPENDECTOMY    . BACK SURGERY    . BREAST EXCISIONAL BIOPSY Left   . BREAST SURGERY  10-09-13   breast reduction--Dr. Dessie Coma  . BUNIONECTOMY     bilateral  . CARDIOVASCULAR STRESS TEST  07/02/2008   EF 55-60%  . colonscopy    . DENTAL SURGERY  2007  . ESOPHAGOGASTRODUODENOSCOPY ENDOSCOPY    . FOOT SURGERY     LEFT FOOT  . left breast mass removed    . OOPHORECTOMY  1982   RSO partial and LSO, uterus  . REDUCTION MAMMAPLASTY Bilateral   . RSO  2002   partial  . ruptured disc surgery  07,08   . TOTAL ABDOMINAL HYSTERECTOMY     Social History   Social History Narrative  . Not on file   Immunization History  Administered Date(s) Administered  . Influenza Split 08/17/2011  . Zoster Recombinat (Shingrix) 07/08/2018, 01/03/2019     Objective: Vital Signs: BP 110/79 (BP Location: Right Arm, Patient Position: Sitting, Cuff Size: Large)   Pulse 91   Ht 5\' 5"  (1.651 m)   Wt 141 lb (64 kg)   LMP 11/07/1980   BMI 23.46 kg/m    Physical Exam Vitals signs and nursing note reviewed.  Constitutional:      Appearance: She is well-developed.  HENT:     Head: Normocephalic and atraumatic.  Eyes:     Conjunctiva/sclera: Conjunctivae normal.  Neck:     Musculoskeletal: Normal  range of motion.  Cardiovascular:     Rate and Rhythm: Normal rate and regular rhythm.     Heart sounds: Normal heart sounds.  Pulmonary:     Effort: Pulmonary effort is normal.     Breath sounds: Normal breath sounds.  Abdominal:     General: Bowel sounds are normal.     Palpations: Abdomen is soft.  Lymphadenopathy:     Cervical: No cervical adenopathy.  Skin:    General: Skin is warm and dry.     Capillary Refill: Capillary refill takes less than 2 seconds.  Neurological:     Mental Status: She is alert and oriented to person, place, and time.  Psychiatric:        Behavior: Behavior normal.      Musculoskeletal Exam: C-spine good ROM.  Limited and painful ROM of lumbar spine. Midline spinal tenderness in lumbar region.  Positive leg raise. Shoulder joints, elbow joints, wrist joints, MCPs, PIPs, and DIPs good ROM with no synovitis.  Complete fist formation bilaterally.  Hip joints, knee joints, ankle joints, MTPs, PIPs, and DIPs good ROM with no synovitis.  No warmth or effusion of knee joints.  No tenderness or swelling of ankle joints.   CDAI Exam: CDAI Score: - Patient Global: -; Provider Global: - Swollen: -; Tender: - Joint Exam   No joint exam has been documented for this visit    There is currently no information documented on the homunculus. Go to the Rheumatology activity and complete the homunculus joint exam.  Investigation: No additional findings.  Imaging: No results found.  Recent Labs: Lab Results  Component Value Date   WBC 7.1 08/15/2016   HGB 13.9 08/15/2016   PLT 248.0 08/15/2016   NA 145 (H) 09/26/2017   K 4.2 09/26/2017   CL 104 09/26/2017   CO2 24 09/26/2017   GLUCOSE 79 09/26/2017   BUN 10 09/26/2017   CREATININE 0.67 09/26/2017   BILITOT 0.5 09/26/2017   ALKPHOS 91 09/26/2017   AST 27 09/26/2017   ALT 23 09/26/2017   PROT 7.1 09/26/2017   ALBUMIN 4.8 09/26/2017   CALCIUM 9.8 09/26/2017   GFRAA 110 09/26/2017    Speciality Comments: No specialty comments available.  Procedures:  No procedures performed Allergies: Codeine, Lipitor [atorvastatin], and Latex   Assessment / Plan:     Visit Diagnoses: Fibromyalgia - Her fibromyalgia pain has been manageable recently.  She has not had any recent flares.  She has not had any fatigue or insomnia until the past 1 week.  She has been experiencing nocturnal pain in her lower back which is caused interrupted sleep at night.  She continues to take gabapentin 300 mg 1 capsule by mouth at bedtime and Zanaflex 4 mg by mouth at bedtime.  Zanaflex has helped her lower back pain to an extent.  She does not need any refills at this time.  She will follow-up in 6 months.   Primary insomnia: She has been experiencing nocturnal pain for the past 1 week due to lower back pain.  She typically sleeps well at night.  She takes gabapentin 300 mg 1 capsule by mouth at bedtime and Zanaflex 4 mg by mouth at bedtime.  Chronic fatigue: She has not had any worsening fatigue recently.   DDD (degenerative disc disease), lumbar: She has had 2 surgeries in the past performed by Dr. Sherwood Gambler.  MRI of the lumbar spine was obtained on 05/18/2013 which revealed postoperative change on the left at L4-L5.  She presents today  with increased lower back pain that started 1 week ago when leaning over to get something out of her dresser.  She experienced severe pain initially and about 2 to 3 days later she started having right sided radiculopathy. She started noticing increased muscle weakness in the right lower extremity as well.  On exam she has painful range of motion limited due to discomfort.  She has midline spinal tenderness in the lumbar region.  She has a positive straight leg raise.  She was in significant discomfort when in a seated position as well as when trying to ambulate.  She has difficulty laying supine.  She has tried taking Advil, Zanaflex, and using a heating pad without much relief.  Her symptoms have been unchanged for the past 1 week.  She has not followed up with Dr. Sherwood Gambler yet.  X-rays of the lumbar spine were obtained today.  Due to the severity of her symptoms as well as prior operations we will order an MRI of the lumbar spine.  Chronic bilateral low back pain with right-sided sciatica - She presents today with lower back pain that started 1 week ago.  She states she has been experiencing severe pain that has "been taking my breath away."  She experiencing right-sided radiculopathy. Positive straight leg raise.   X-rays of the lumbar spine were obtained today.  Plan: XR Lumbar Spine 2-3 Views  Raynaud's disease without gangrene: She has intermittent symptoms of Raynaud's.  No digital ulcerations or signs of gangrene.   Trochanteric bursitis of left hip: She has no tenderness on exam today.    Other medical conditions are listed as follows:   History of migraine  History of hypercholesterolemia  Ocular rosacea  History of gastroesophageal reflux (GERD)  History of asthma  History of CHF (congestive heart failure)   Orders: Orders Placed This Encounter  Procedures  . XR Lumbar Spine 2-3 Views   No orders of the defined types were placed in this encounter.   Face-to-face time spent  with patient was 30 minutes. Greater than 50% of time was spent in counseling and coordination of care.  Follow-Up Instructions: Return in about 6 months (around 01/14/2020) for Fibromyalgia, DDD.   Ofilia Neas, PA-C   I examined and evaluated the patient with Hazel Sams PA.  Patient was experiencing severe lower back pain today.  She had a straight leg raising test positive.  X-rays were unchanged from the previous x-rays of 2017.  She has severe L4-L5 narrowing.  I will schedule MRI to evaluate further as she has right-sided radiculopathy and severe pain.  I offered prednisone but she declined.  Have also advised her to contact Dr. Sherwood Gambler in case her symptoms get worse.  The plan of care was discussed as noted above.  Bo Merino, MD  Note - This record has been created using Editor, commissioning.  Chart creation errors have been sought, but may not always  have been located. Such creation errors do not reflect on  the standard of medical care.

## 2019-07-18 ENCOUNTER — Telehealth: Payer: Self-pay | Admitting: Rheumatology

## 2019-07-18 MED ORDER — METHYLPREDNISOLONE 4 MG PO TBPK: ORAL_TABLET | ORAL | 0 refills | Status: DC

## 2019-07-18 NOTE — Telephone Encounter (Signed)
Patient advised prescription for Medrol dose pack sent to the pharmacy. Patient advised it has the risk of elevating her glucose and blood pressure. Patient advised to monitor.

## 2019-07-18 NOTE — Telephone Encounter (Signed)
We discussed a prednisone taper. OK to call in a six day Medrol dose pack. Please, advise her the risk of elevation of glucose and BP.

## 2019-07-18 NOTE — Telephone Encounter (Signed)
Patient has changed her mind about getting a rx for an ant-iinflammatory. Patient uses [facility] in [place]. ([facility] PHARM[facility])

## 2019-07-22 ENCOUNTER — Telehealth: Payer: Self-pay | Admitting: Rheumatology

## 2019-07-22 NOTE — Telephone Encounter (Signed)
Patient left a voicemail stating she saw Dr. Estanislado Pandy last week who gave her a new medication which has caused her to have a vaginal yeast infection.  Patient is requesting a return call.

## 2019-07-23 DIAGNOSIS — R29898 Other symptoms and signs involving the musculoskeletal system: Secondary | ICD-10-CM | POA: Diagnosis not present

## 2019-07-23 DIAGNOSIS — M47816 Spondylosis without myelopathy or radiculopathy, lumbar region: Secondary | ICD-10-CM | POA: Diagnosis not present

## 2019-07-23 DIAGNOSIS — M5416 Radiculopathy, lumbar region: Secondary | ICD-10-CM | POA: Diagnosis not present

## 2019-07-23 DIAGNOSIS — M5136 Other intervertebral disc degeneration, lumbar region: Secondary | ICD-10-CM | POA: Diagnosis not present

## 2019-07-23 NOTE — Telephone Encounter (Signed)
Attempted to contact the patient and left message for patient to call the office.  

## 2019-07-25 ENCOUNTER — Other Ambulatory Visit: Payer: Self-pay | Admitting: Obstetrics and Gynecology

## 2019-07-25 DIAGNOSIS — M5416 Radiculopathy, lumbar region: Secondary | ICD-10-CM | POA: Diagnosis not present

## 2019-07-25 DIAGNOSIS — Z9889 Other specified postprocedural states: Secondary | ICD-10-CM | POA: Diagnosis not present

## 2019-07-25 DIAGNOSIS — Z1231 Encounter for screening mammogram for malignant neoplasm of breast: Secondary | ICD-10-CM

## 2019-07-25 DIAGNOSIS — M5136 Other intervertebral disc degeneration, lumbar region: Secondary | ICD-10-CM | POA: Diagnosis not present

## 2019-07-25 DIAGNOSIS — M5127 Other intervertebral disc displacement, lumbosacral region: Secondary | ICD-10-CM | POA: Diagnosis not present

## 2019-07-25 DIAGNOSIS — M47816 Spondylosis without myelopathy or radiculopathy, lumbar region: Secondary | ICD-10-CM | POA: Diagnosis not present

## 2019-08-08 ENCOUNTER — Other Ambulatory Visit: Payer: Self-pay | Admitting: Physician Assistant

## 2019-08-08 DIAGNOSIS — Z23 Encounter for immunization: Secondary | ICD-10-CM | POA: Diagnosis not present

## 2019-08-08 NOTE — Telephone Encounter (Signed)
Last Visit: 07/17/19 Next Visit: 01/16/20  Okay to refill per Dr. Estanislado Pandy

## 2019-08-12 DIAGNOSIS — M5126 Other intervertebral disc displacement, lumbar region: Secondary | ICD-10-CM | POA: Diagnosis not present

## 2019-08-12 DIAGNOSIS — M5416 Radiculopathy, lumbar region: Secondary | ICD-10-CM | POA: Diagnosis not present

## 2019-08-12 DIAGNOSIS — M47816 Spondylosis without myelopathy or radiculopathy, lumbar region: Secondary | ICD-10-CM | POA: Diagnosis not present

## 2019-08-12 DIAGNOSIS — Z9889 Other specified postprocedural states: Secondary | ICD-10-CM | POA: Diagnosis not present

## 2019-08-30 DIAGNOSIS — Z01812 Encounter for preprocedural laboratory examination: Secondary | ICD-10-CM | POA: Diagnosis not present

## 2019-08-30 DIAGNOSIS — R3989 Other symptoms and signs involving the genitourinary system: Secondary | ICD-10-CM | POA: Diagnosis not present

## 2019-08-30 DIAGNOSIS — Z20828 Contact with and (suspected) exposure to other viral communicable diseases: Secondary | ICD-10-CM | POA: Diagnosis not present

## 2019-09-06 DIAGNOSIS — N3080 Other cystitis without hematuria: Secondary | ICD-10-CM | POA: Diagnosis not present

## 2019-09-06 DIAGNOSIS — R3915 Urgency of urination: Secondary | ICD-10-CM | POA: Diagnosis not present

## 2019-09-06 DIAGNOSIS — R350 Frequency of micturition: Secondary | ICD-10-CM | POA: Diagnosis not present

## 2019-09-06 DIAGNOSIS — N3010 Interstitial cystitis (chronic) without hematuria: Secondary | ICD-10-CM | POA: Diagnosis not present

## 2019-09-06 DIAGNOSIS — Z9889 Other specified postprocedural states: Secondary | ICD-10-CM

## 2019-09-06 DIAGNOSIS — M7918 Myalgia, other site: Secondary | ICD-10-CM | POA: Diagnosis not present

## 2019-09-06 HISTORY — DX: Other specified postprocedural states: Z98.890

## 2019-09-10 ENCOUNTER — Other Ambulatory Visit: Payer: Self-pay

## 2019-09-10 ENCOUNTER — Ambulatory Visit
Admission: RE | Admit: 2019-09-10 | Discharge: 2019-09-10 | Disposition: A | Discharge status: Home / Self Care | Payer: BC Managed Care – PPO | Source: Ambulatory Visit | Attending: Obstetrics and Gynecology | Admitting: Obstetrics and Gynecology

## 2019-09-10 DIAGNOSIS — Z1231 Encounter for screening mammogram for malignant neoplasm of breast: Secondary | ICD-10-CM

## 2019-09-17 ENCOUNTER — Ambulatory Visit (INDEPENDENT_AMBULATORY_CARE_PROVIDER_SITE_OTHER): Payer: BC Managed Care – PPO | Admitting: Gastroenterology

## 2019-09-17 ENCOUNTER — Encounter: Payer: Self-pay | Admitting: Gastroenterology

## 2019-09-17 VITALS — BP 120/74 | HR 79 | Temp 98.4°F | Ht 65.0 in | Wt 141.5 lb

## 2019-09-17 DIAGNOSIS — K582 Mixed irritable bowel syndrome: Secondary | ICD-10-CM

## 2019-09-17 DIAGNOSIS — Z86010 Personal history of colonic polyps: Secondary | ICD-10-CM

## 2019-09-17 MED ORDER — DICYCLOMINE HCL 10 MG PO CAPS: 10.0000 mg | ORAL_CAPSULE | ORAL | 11 refills | Status: DC | PRN

## 2019-09-17 NOTE — Progress Notes (Signed)
    History of Present Illness: This is a 63 year old female with IBS.  She has alternating diarrhea and constipation.  She can go up to 1 week between bowel movements and then will take Dulcolax.  When she has multiple episodes of diarrhea Imodium is helpful.  She underwent cystoscopy in late October and since then her IBS symptoms have been more active.  She uses dicyclomine infrequently as she is concerned about potential long-term memory related side effects.  Current Medications, Allergies, Past Medical History, Past Surgical History, Family History and Social History were reviewed in [employer] record.  Physical Exam: General: Well developed, well nourished, no acute distress Head: Normocephalic and atraumatic Eyes:  sclerae anicteric, EOMI Ears: Normal auditory acuity Mouth: No deformity or lesions Lungs: Clear throughout to auscultation Heart: Regular rate and rhythm; no murmurs, rubs or bruits Abdomen: Soft, non tender and non distended. No masses, hepatosplenomegaly or hernias noted. Normal Bowel sounds Rectal: Not done Musculoskeletal: Symmetrical with no gross deformities  Pulses:  Normal pulses noted Extremities: No clubbing, cyanosis, edema or deformities noted Neurological: Alert oriented x 4, grossly nonfocal Psychological:  Alert and cooperative. Normal mood and affect   Assessment and Recommendations:  1. IBS-A.  MiraLAX daily to twice daily if no bowel movement for 2 to 3 days.  Imodium 3 times daily as needed diarrhea.  She would like to discontinue anticholinergic medications for management of her IBS.  Trial of IBgard 1-2 p.o. 3 times daily as needed.  If this is not effective we will resume dicyclomine 10 mg p.o. 3 times daily as needed.  Avoid foods that trigger symptoms. REV in 1 year.   2. Personal history of adenomatous colon polyps. Surveillance colonoscopy in 08/2021.    I spent 15 minutes of face-to-face time with the patient.  Greater than 50% of the time was spent counseling and coordinating care.

## 2019-09-17 NOTE — Patient Instructions (Signed)
We have sent the following medications to your pharmacy for you to pick up at your convenience: dicyclomine.   Take over the counter Miralax daily as needed when constipated.   Start IBgard samples to take as directed on packaging.   Thank you for choosing me and [facility].  Pricilla Riffle. Dagoberto Ligas., MD., Marval Regal

## 2019-09-30 DIAGNOSIS — R82998 Other abnormal findings in urine: Secondary | ICD-10-CM | POA: Diagnosis not present

## 2019-09-30 DIAGNOSIS — R3989 Other symptoms and signs involving the genitourinary system: Secondary | ICD-10-CM | POA: Diagnosis not present

## 2019-10-01 ENCOUNTER — Emergency Department (HOSPITAL_COMMUNITY)
Admission: EM | Admit: 2019-10-01 | Discharge: 2019-10-01 | Disposition: A | Discharge status: Home / Self Care | Payer: BC Managed Care – PPO | Attending: Emergency Medicine | Admitting: Emergency Medicine

## 2019-10-01 ENCOUNTER — Other Ambulatory Visit: Payer: Self-pay

## 2019-10-01 ENCOUNTER — Encounter (HOSPITAL_COMMUNITY): Payer: Self-pay | Admitting: Emergency Medicine

## 2019-10-01 DIAGNOSIS — L509 Urticaria, unspecified: Secondary | ICD-10-CM | POA: Diagnosis not present

## 2019-10-01 DIAGNOSIS — L299 Pruritus, unspecified: Secondary | ICD-10-CM | POA: Diagnosis not present

## 2019-10-01 DIAGNOSIS — T7840XA Allergy, unspecified, initial encounter: Secondary | ICD-10-CM

## 2019-10-01 DIAGNOSIS — L500 Allergic urticaria: Secondary | ICD-10-CM | POA: Insufficient documentation

## 2019-10-01 DIAGNOSIS — Z8709 Personal history of other diseases of the respiratory system: Secondary | ICD-10-CM | POA: Diagnosis not present

## 2019-10-01 DIAGNOSIS — Z79899 Other long term (current) drug therapy: Secondary | ICD-10-CM | POA: Diagnosis not present

## 2019-10-01 DIAGNOSIS — I10 Essential (primary) hypertension: Secondary | ICD-10-CM | POA: Diagnosis not present

## 2019-10-01 MED ORDER — METHYLPREDNISOLONE SODIUM SUCC 125 MG IJ SOLR
125.0000 mg | Freq: Once | INTRAMUSCULAR | Status: AC
Start: 2019-10-01 — End: 2019-10-01
  Administered 2019-10-01: 125 mg via INTRAVENOUS
  Filled 2019-10-01: qty 2

## 2019-10-01 MED ORDER — CETIRIZINE HCL 10 MG PO TABS: 10.0000 mg | ORAL_TABLET | Freq: Every day | ORAL | 0 refills | Status: DC

## 2019-10-01 MED ORDER — FAMOTIDINE 20 MG PO TABS: 20.0000 mg | ORAL_TABLET | Freq: Two times a day (BID) | ORAL | 0 refills | Status: DC

## 2019-10-01 MED ORDER — FAMOTIDINE IN NACL 20-0.9 MG/50ML-% IV SOLN
20.0000 mg | Freq: Once | INTRAVENOUS | Status: AC
Start: 2019-10-01 — End: 2019-10-01
  Administered 2019-10-01: 20 mg via INTRAVENOUS
  Filled 2019-10-01: qty 50

## 2019-10-01 MED ORDER — DIPHENHYDRAMINE HCL 50 MG/ML IJ SOLN
25.0000 mg | Freq: Once | INTRAMUSCULAR | Status: AC
Start: 2019-10-01 — End: 2019-10-01
  Administered 2019-10-01: 25 mg via INTRAVENOUS
  Filled 2019-10-01: qty 1

## 2019-10-01 NOTE — ED Provider Notes (Signed)
[facility] EMERGENCY DEPARTMENT Provider Note   CSN: [id number] Arrival date & time: 10/01/19  1421     History   Chief Complaint Chief Complaint  Patient presents with   Allergic Reaction    HPI Delisia Vanalstine is a 63 y.o. female presenting for evaluation of hives and lip swelling.  Patient states yesterday she noticed 1 small hive on her wrist.  She was at the urologist at the time, given hydrocortisone cream for this.  When she woke this morning, she had hives all over both arms and both legs.  She had some hives on her abdomen.  They are itchy, but nontender.  She called her PCP who prescribed her 20 mg of prednisone, which she took with no improvement.  About 1 hour prior to arrival, she started to develop upper lip swelling and she felt it was difficult to swallow.  She called her PCP who told to come to the come to the ED.  She denies new medications or foods.  She denies new environments.  No new detergents, soaps, shampoos.   Additional history obtained from chart review.  Patient with a history of fibromyalgia, ICD, hypertension, migraines, IBS.     HPI  Past Medical History:  Diagnosis Date   Asthma    Atrophic vaginitis    Blood transfusion without reported diagnosis 1975   with vaginal delivery--very anemic   Cystic teratoma    Elevated cholesterol    Endometriosis    Esophageal ulcer    Fibromyalgia    Fundic gland polyps of stomach, benign    GERD (gastroesophageal reflux disease)    History of diastolic dysfunction    History of migraine headaches    Leg edema    left leg   Ocular rosacea    Osteoarthritis    back,hands, knees   Overactive bladder    Raynaud's disease    Status post cystoscopy 09/06/2019   [facility]   Tachycardia    Tubular adenoma of colon 08/2016    Patient Active Problem List   Diagnosis Date Noted   Chest tightness 02/21/2019   SVT (supraventricular tachycardia) ([place])  09/26/2016   HTN (hypertension) 09/24/2015   Palpitations 03/05/2015   Fibromyalgia 03/05/2015   Chronic diastolic CHF (congestive heart failure), NYHA class 2 ([place]) 06/20/2014   Sinus tachycardia 09/14/2012   Dysphagia 06/29/2012   Ocular rosacea    GERD (gastroesophageal reflux disease)    Cough 09/09/2011   Chest tightness, discomfort, or pressure 09/09/2011   Hyperlipidemia 05/10/2011   SWELLING OF LIMB 01/20/2011   GERD 02/03/2010   ABDOMINAL PAIN-EPIGASTRIC 02/03/2010   ABDOMINAL PAIN-MULTIPLE SITES 02/03/2010   CONSTIPATION 09/19/2008   ABDOMINAL PAIN, LEFT LOWER QUADRANT 09/19/2008   COLONIC POLYPS, HYPERPLASTIC, HX OF 09/19/2008    Past Surgical History:  Procedure Laterality Date   APPENDECTOMY     BACK SURGERY     BREAST EXCISIONAL BIOPSY Left    BREAST SURGERY  10-09-13   breast reduction--Dr. Dessie Coma   BUNIONECTOMY     bilateral   CARDIOVASCULAR STRESS TEST  07/02/2008   EF 55-60%   colonscopy     DENTAL SURGERY  2007   ESOPHAGOGASTRODUODENOSCOPY ENDOSCOPY     FOOT SURGERY     LEFT FOOT   left breast mass removed     OOPHORECTOMY  1982   RSO partial and LSO, uterus   REDUCTION MAMMAPLASTY Bilateral    RSO  2002   partial   ruptured disc surgery  07,08   TOTAL ABDOMINAL HYSTERECTOMY       OB History    Gravida  1   Para  1   Term  1   Preterm      AB      Living  1     SAB      TAB      Ectopic      Multiple      Live Births               Home Medications    Prior to Admission medications   Medication Sig Start Date End Date Taking? Authorizing Provider  cycloSPORINE (RESTASIS) 0.05 % ophthalmic emulsion Place 1 drop into both eyes 2 (two) times daily.   Yes [provider]  diazepam (VALIUM) 5 MG tablet Take 5 mg by mouth every 6 (six) hours as needed for anxiety.   Yes [provider]  dicyclomine (BENTYL) 10 MG capsule Take 1 capsule (10 mg total) by mouth as needed  for spasms. 09/17/19  Yes Ladene Artist, MD  diltiazem (CARDIZEM CD) 120 MG 24 hr capsule TAKE 1 CAPSULE BY MOUTH EVERY DAY Patient taking differently: Take 120 mg by mouth daily. TAKE 1 CAPSULE BY MOUTH EVERY DAY 09/26/18  Yes Nahser, Wonda Cheng, MD  diltiazem (CARDIZEM) 30 MG tablet Take 1 tablet (30 mg total) by mouth 4 (four) times daily as needed. 02/21/19  Yes Nahser, Wonda Cheng, MD  DULERA 200-5 MCG/ACT AERO Inhale 2 puffs into the lungs 2 (two) times daily. 05/27/17  Yes [provider]  Estradiol 10 % CREA Apply 1 application topically as directed.    Yes [provider]  fluconazole (DIFLUCAN) 150 MG tablet Take 150 mg by mouth once. 09/30/19  Yes [provider]  gabapentin (NEURONTIN) 300 MG capsule TAKE 1 CAPSULE BY MOUTH EVERYDAY AT BEDTIME Patient taking differently: Take 300 mg by mouth at bedtime. TAKE 1 CAPSULE BY MOUTH EVERYDAY AT BEDTIME 08/08/19  Yes Deveshwar, Abel Presto, MD  hydrocortisone 2.5 % cream Apply 1 application topically 2 (two) times daily. 09/30/19  Yes [provider]  hydrOXYzine (ATARAX/VISTARIL) 10 MG tablet Take 10 mg by mouth 3 (three) times daily as needed for anxiety.    Yes [provider]  Meth-Hyo-M Bl-Na Phos-Ph Sal (URO-MP) 118 MG CAPS Take 1 capsule by mouth 4 (four) times daily as needed (bladder).  09/06/19  Yes [provider]  montelukast (SINGULAIR) 10 MG tablet Take 10 mg by mouth at bedtime.     Yes [provider]  Multiple Vitamin (MULTIVITAMIN) tablet Take 1 tablet by mouth daily.   Yes [provider]  Omega-3 Fatty Acids (OMEGA 3 PO) Take 1 capsule by mouth daily.     Yes [provider]  predniSONE (DELTASONE) 10 MG tablet Take 10 mg by mouth 2 (two) times daily. 10/01/19  Yes [provider]  PROAIR HFA 108 (90 BASE) MCG/ACT inhaler Inhale 2 puffs into the lungs every 6 (six) hours as needed for wheezing or shortness of breath.  08/07/15  Yes [provider]  rosuvastatin (CRESTOR) 5 MG tablet Take 5 mg by mouth. Five days a week (Mon-Fri)   Yes [provider]  tiZANidine (ZANAFLEX) 4 MG tablet Take 1 tablet (4 mg total) by mouth at bedtime. 04/19/19  Yes Ofilia Neas, PA-C  cetirizine (ZYRTEC) 10 MG tablet Take 1 tablet (10 mg total) by mouth daily. 10/01/19   Akshar Starnes, PA-C  famotidine (PEPCID) 20 MG tablet Take 1 tablet (20 mg total) by mouth 2 (two) times daily. 10/01/19   Eisha Chatterjee, PA-C  methylPREDNISolone (MEDROL DOSEPAK) 4 MG TBPK tablet Take as directed Patient not taking: Reported on 10/01/2019 07/18/19   Bo Merino, MD    Family History Family History  Problem Relation Age of Onset   Coronary artery disease Father    Hypertension Father    Heart disease Father    Heart attack Father    Asthma Mother    Hypertension Mother    Alzheimer's disease Maternal Grandmother    Lung cancer Maternal Grandfather    Heart attack Maternal Grandfather    Heart failure Paternal Grandfather    Heart attack Paternal Grandfather    Skin cancer Sister        melanoma   Hepatitis C Brother    Skin cancer Brother        melanoma   Pancreatic cancer Maternal Aunt    Colon cancer Neg Hx    Colon polyps Neg Hx    Rectal cancer Neg Hx    Stomach cancer Neg Hx    Breast cancer Neg Hx     Social History Social History   Tobacco Use   Smoking status: Never Smoker   Smokeless tobacco: Never Used  Substance Use Topics   Alcohol use: Yes    Comment: social    Drug use: No     Allergies   Codeine, Lipitor [atorvastatin], and Latex   Review of Systems Review of Systems  HENT:       Lip swelling and difficulty swallowing  Skin: Positive for wound.  All other systems reviewed and are negative.    Physical Exam Updated Vital Signs BP 118/63 (BP Location: Right Arm)    Pulse 80    Temp 99 F (37.2 C) (Oral)    Resp 14    LMP 11/07/1980    SpO2 100%   Physical  Exam Vitals signs and nursing note reviewed.  Constitutional:      General: She is not in acute distress.    Appearance: She is well-developed.     Comments: Resting comfortably in the bed in no acute distress  HENT:     Head: Normocephalic and atraumatic.     Comments: No obvious lip swelling on exam.  No tongue or throat swelling.  Handling secretions easily.  Airway intact. Eyes:     Extraocular Movements: Extraocular movements intact.     Conjunctiva/sclera: Conjunctivae normal.     Pupils: Pupils are equal, round, and reactive to light.  Neck:     Musculoskeletal: Normal range of motion and neck supple.  Cardiovascular:     Rate and Rhythm: Normal rate and regular rhythm.     Pulses: Normal pulses.  Pulmonary:     Effort: Pulmonary effort is normal. No respiratory distress.     Breath sounds: Normal breath sounds. No wheezing.     Comments: Speaking in full sentences.  Clear lung sounds in all fields.  No respiratory distress Abdominal:     General: There is no distension.     Palpations: Abdomen is soft. There is no mass.     Tenderness: There is no abdominal tenderness. There is no guarding or rebound.     Comments: No ttp of the abd  Musculoskeletal: Normal range of motion.  Skin:    General: Skin is warm and dry.     Capillary Refill: Capillary refill takes less than  2 seconds.     Findings: Rash present.     Comments: Urticaria of bilateral forearms, lower abdomen, upper thighs, and back.  Neurological:     Mental Status: She is alert and oriented to person, place, and time.      ED Treatments / Results  Labs (all labs ordered are listed, but only abnormal results are displayed) Labs Reviewed - No data to display  EKG None  Radiology No results found.  Procedures Procedures (including critical care time)  Medications Ordered in ED Medications  diphenhydrAMINE (BENADRYL) injection 25 mg (25 mg Intravenous Given 10/01/19 1503)  famotidine (PEPCID) IVPB  20 mg premix (20 mg Intravenous New Bag/Given 10/01/19 1505)  methylPREDNISolone sodium succinate (SOLU-MEDROL) 125 mg/2 mL injection 125 mg (125 mg Intravenous Given 10/01/19 1504)     Initial Impression / Assessment and Plan / ED Course  I have reviewed the triage vital signs and the nursing notes.  Pertinent labs & imaging results that were available during my care of the patient were reviewed by me and considered in my medical decision making (see chart for details).        Patient presenting for evaluation of allergic reaction.  Physical exam reassuring, she appears nontoxic.  Patient reporting lip swelling, but no appreciable swelling on my exam.  Airway is intact.  She does have urticaria.  I do not believe she needs epinephrine at this time, will treat with Pepcid, Benadryl, and steroids.  Will reassess.  If not improving, consider need for appendectomy.  If any worsening, will give epi.  On reassessment 1 hour after medications, urticaria is improved.  Lip swelling improved for patient.  No worsening symptoms.  Discussed plan with patient.  Discussed continued H1 and H2 blocker use at home and close monitoring of symptoms.  Referred to the allergy center for allergy testing as needed.  Discussed strict return precautions given, including signs of anaphylaxis.  At this time, patient appears safe for discharge.  Return precautions given.  Patient states she understands and agrees to plan.  Final Clinical Impressions(s) / ED Diagnoses   Final diagnoses:  Hives  Allergic reaction, initial encounter    ED Discharge Orders         Ordered    cetirizine (ZYRTEC) 10 MG tablet  Daily     10/01/19 1610    famotidine (PEPCID) 20 MG tablet  2 times daily     10/01/19 1610           Virgil Lightner, PA-C 10/01/19 1624    Little, Wenda Overland, MD 10/01/19 [phone]

## 2019-10-01 NOTE — Discharge Instructions (Signed)
Take famotidine and cetirizine for the next week.  After this, as needed for hives or continued allergic reaction. Use Benadryl as needed for recurrence of hives. You may continue to take prednisone if you desire. Use hydrocortisone cream as needed for itching. Use a cool rag to help with itching and increasing welts. Follow-up with the allergy center as needed for allergy testing. Return to the emergency room if you develop worsening allergic reaction, difficulty breathing, tongue/throat/lip swelling, or any new, worsening, or concerning symptoms.

## 2019-10-01 NOTE — ED Triage Notes (Signed)
Pt states that she was [place] visiting and started to have a rash / hives on arms and inner thighs , felt like her throat was getting tight and lips were swelling

## 2019-10-06 ENCOUNTER — Other Ambulatory Visit: Payer: Self-pay | Admitting: Cardiovascular Disease

## 2019-10-08 DIAGNOSIS — L501 Idiopathic urticaria: Secondary | ICD-10-CM | POA: Diagnosis not present

## 2019-10-08 DIAGNOSIS — J3089 Other allergic rhinitis: Secondary | ICD-10-CM | POA: Diagnosis not present

## 2019-10-08 DIAGNOSIS — J4540 Moderate persistent asthma, uncomplicated: Secondary | ICD-10-CM | POA: Diagnosis not present

## 2019-10-17 ENCOUNTER — Ambulatory Visit: Payer: BC Managed Care – PPO | Admitting: Rheumatology

## 2019-10-17 DIAGNOSIS — I788 Other diseases of capillaries: Secondary | ICD-10-CM | POA: Diagnosis not present

## 2019-10-17 DIAGNOSIS — D225 Melanocytic nevi of trunk: Secondary | ICD-10-CM | POA: Diagnosis not present

## 2019-10-17 DIAGNOSIS — D692 Other nonthrombocytopenic purpura: Secondary | ICD-10-CM | POA: Diagnosis not present

## 2019-10-17 DIAGNOSIS — D2261 Melanocytic nevi of right upper limb, including shoulder: Secondary | ICD-10-CM | POA: Diagnosis not present

## 2019-10-29 DIAGNOSIS — R300 Dysuria: Secondary | ICD-10-CM | POA: Diagnosis not present

## 2019-10-29 DIAGNOSIS — L509 Urticaria, unspecified: Secondary | ICD-10-CM | POA: Diagnosis not present

## 2019-11-04 ENCOUNTER — Telehealth: Payer: Self-pay | Admitting: Obstetrics and Gynecology

## 2019-11-04 DIAGNOSIS — N3010 Interstitial cystitis (chronic) without hematuria: Secondary | ICD-10-CM | POA: Diagnosis not present

## 2019-11-04 NOTE — Telephone Encounter (Signed)
Patient left voicemail over lunch stating that she has been experiencing left breast pain

## 2019-11-04 NOTE — Telephone Encounter (Signed)
Spoke with patient. Patient reports left breast sensitivity for approximately 1 month. Denies lumps, skin changes, nipple d/c, fever/chills. Last screening MMG 09/11/19 at [facility], negative. No longer using vaginal estradiol cream, stopped 6 wks ago. [id number] prescreen negative, precautions reviewed. OV scheduled for 12/30 at 11am with Dr. Quincy Simmonds. Last AEX 04/04/19  Routing to provider for final review. Patient is agreeable to disposition. Will close encounter.

## 2019-11-05 NOTE — Progress Notes (Signed)
GYNECOLOGY  VISIT   HPI: 63 y.o.   Married  Caucasian  female   [id number] with Patient's last menstrual period was 11/07/1980.here for left breast sensitivity x 2 months. Patient also complaining of urinary frequency and burning with urination and slight vaginal discharge.   I referred the patient to Dr. Amalia Hailey for bladder pain.  She had a cystoscopy and was dx with interstitial cystitis.  She did her first bladder instillation this week.  Some improvement.  She feels a lot of pressure if she gets up and moves around. She is using UroMP, which gives her bladder relief. She is using Diazapem daily vaginally.  She is not using overactive bladder medication.  She does take Neurontin 300 mg at hs.  Taking more causes side effects.   This summer she stopped her Zoloft, which she was using for hot flashes.  Her PCP was prescribing. She feels like she is at rock bottom due to IC.  Some vaginal discharge, and uncertain if it is the vaginal Diazepam.  Did use Monistat, last time 4 days ago.   She stopped using vaginal estrogen cream prior to her cystoscopy, and wanted to restart but she developed left breast sensitivity.   She states she broke out in hives and no one is sure why.   Urine dip - positive nitrates and trace of RBCs.   GYNECOLOGIC HISTORY: Patient's last menstrual period was 11/07/1980. Contraception: Hysterectomy Menopausal hormone therapy:  Estrogen cream Last mammogram: 09-10-19 3D/Neg/density B/BiRads1 Last pap smear:   03-06-15 Neg:Neg HR HPV, 02-07-11 Neg        OB History    Gravida  1   Para  1   Term  1   Preterm      AB      Living  1     SAB      TAB      Ectopic      Multiple      Live Births                 Patient Active Problem List   Diagnosis Date Noted  . Chest tightness 02/21/2019  . SVT (supraventricular tachycardia) ([place]) 09/26/2016  . HTN (hypertension) 09/24/2015  . Palpitations 03/05/2015  . Fibromyalgia 03/05/2015  .  Chronic diastolic CHF (congestive heart failure), NYHA class 2 ([place]) 06/20/2014  . Sinus tachycardia 09/14/2012  . Dysphagia 06/29/2012  . Ocular rosacea   . GERD (gastroesophageal reflux disease)   . Cough 09/09/2011  . Chest tightness, discomfort, or pressure 09/09/2011  . Hyperlipidemia 05/10/2011  . SWELLING OF LIMB 01/20/2011  . GERD 02/03/2010  . ABDOMINAL PAIN-EPIGASTRIC 02/03/2010  . ABDOMINAL PAIN-MULTIPLE SITES 02/03/2010  . CONSTIPATION 09/19/2008  . ABDOMINAL PAIN, LEFT LOWER QUADRANT 09/19/2008  . COLONIC POLYPS, HYPERPLASTIC, HX OF 09/19/2008    Past Medical History:  Diagnosis Date  . Asthma   . Atrophic vaginitis   . Blood transfusion without reported diagnosis 1975   with vaginal delivery--very anemic  . Cystic teratoma   . Elevated cholesterol   . Endometriosis   . Esophageal ulcer   . Fibromyalgia   . Fundic gland polyps of stomach, benign   . GERD (gastroesophageal reflux disease)   . History of diastolic dysfunction   . History of migraine headaches   . Leg edema    left leg  . Ocular rosacea   . Osteoarthritis    back,hands, knees  . Overactive bladder   . Raynaud's disease   .  Status post cystoscopy 09/06/2019   [facility]  . Tachycardia   . Tubular adenoma of colon 08/2016    Past Surgical History:  Procedure Laterality Date  . APPENDECTOMY    . BACK SURGERY    . BREAST EXCISIONAL BIOPSY Left   . BREAST SURGERY  10-09-13   breast reduction--Dr. Dessie Coma  . BUNIONECTOMY     bilateral  . CARDIOVASCULAR STRESS TEST  07/02/2008   EF 55-60%  . colonscopy    . DENTAL SURGERY  2007  . ESOPHAGOGASTRODUODENOSCOPY ENDOSCOPY    . FOOT SURGERY     LEFT FOOT  . left breast mass removed    . OOPHORECTOMY  1982   RSO partial and LSO, uterus  . REDUCTION MAMMAPLASTY Bilateral   . RSO  2002   partial  . ruptured disc surgery  07,08  . TOTAL ABDOMINAL HYSTERECTOMY      Current Outpatient Medications  Medication Sig Dispense Refill  .  cycloSPORINE (RESTASIS) 0.05 % ophthalmic emulsion Place 1 drop into both eyes 2 (two) times daily.    . diazepam (VALIUM) 5 MG tablet Take 5 mg by mouth every 6 (six) hours as needed for anxiety.    . dicyclomine (BENTYL) 10 MG capsule Take 1 capsule (10 mg total) by mouth as needed for spasms. 90 capsule 11  . diltiazem (CARDIZEM CD) 120 MG 24 hr capsule TAKE 1 CAPSULE BY MOUTH EVERY DAY (Patient taking differently: Take 120 mg by mouth daily. TAKE 1 CAPSULE BY MOUTH EVERY DAY) 90 capsule 3  . diltiazem (CARDIZEM) 30 MG tablet Take 1 tablet (30 mg total) by mouth 4 (four) times daily as needed. 60 tablet 11  . DULERA 200-5 MCG/ACT AERO Inhale 2 puffs into the lungs 2 (two) times daily.  5  . Estradiol 10 % CREA Apply 1 application topically as directed.     . famotidine (PEPCID) 20 MG tablet Take 1 tablet (20 mg total) by mouth 2 (two) times daily. 21 tablet 0  . fluconazole (DIFLUCAN) 150 MG tablet Take 1 tablet (150 mg total) by mouth once for 1 dose. Take one tablet.  Repeat in 72 hours if symptoms are not completely resolved. 2 tablet 0  . gabapentin (NEURONTIN) 300 MG capsule TAKE 1 CAPSULE BY MOUTH EVERYDAY AT BEDTIME (Patient taking differently: Take 300 mg by mouth at bedtime. TAKE 1 CAPSULE BY MOUTH EVERYDAY AT BEDTIME) 90 capsule 0  . hydrocortisone 2.5 % cream Apply 1 application topically 2 (two) times daily.    . hydrOXYzine (ATARAX/VISTARIL) 25 MG tablet Take 25 mg by mouth at bedtime.    . Meth-Hyo-M Bl-Na Phos-Ph Sal (URO-MP) 118 MG CAPS Take 1 capsule by mouth 4 (four) times daily as needed (bladder).     . metroNIDAZOLE (METROCREAM) 0.75 % cream Apply 1 application topically as needed.    . montelukast (SINGULAIR) 10 MG tablet Take 10 mg by mouth at bedtime.      . Omega-3 Fatty Acids (OMEGA 3 PO) Take 1 capsule by mouth daily.      Marland Kitchen PROAIR HFA 108 (90 BASE) MCG/ACT inhaler Inhale 2 puffs into the lungs every 6 (six) hours as needed for wheezing or shortness of breath.   11  .  rosuvastatin (CRESTOR) 5 MG tablet TAKE 1 TABLET (5 MG TOTAL) BY MOUTH DAILY AT 6 PM. 90 tablet 1  . sertraline (ZOLOFT) 25 MG tablet Take 1 tablet (25 mg total) by mouth daily. 90 tablet 1  . sulfamethoxazole-trimethoprim (BACTRIM DS)  800-160 MG tablet Take 1 tablet by mouth 2 (two) times daily. One PO BID x 3 days 6 tablet 0  . tiZANidine (ZANAFLEX) 4 MG tablet Take 1 tablet (4 mg total) by mouth at bedtime. 30 tablet 0   Current Facility-Administered Medications  Medication Dose Route Frequency Provider Last Rate Last Admin  . 0.9 %  sodium chloride infusion  500 mL Intravenous Continuous Ladene Artist, MD         ALLERGIES: Codeine, Lipitor [atorvastatin], and Latex  Family History  Problem Relation Age of Onset  . Coronary artery disease Father   . Hypertension Father   . Heart disease Father   . Heart attack Father   . Asthma Mother   . Hypertension Mother   . Alzheimer's disease Maternal Grandmother   . Lung cancer Maternal Grandfather   . Heart attack Maternal Grandfather   . Heart failure Paternal Grandfather   . Heart attack Paternal Grandfather   . Skin cancer Sister        melanoma  . Hepatitis C Brother   . Skin cancer Brother        melanoma  . Pancreatic cancer Maternal Aunt   . Colon cancer Neg Hx   . Colon polyps Neg Hx   . Rectal cancer Neg Hx   . Stomach cancer Neg Hx   . Breast cancer Neg Hx     Social History   Socioeconomic History  . Marital status: Married    Spouse name: Not on file  . Number of children: 1  . Years of education: Not on file  . Highest education level: Not on file  Occupational History  . Occupation: retired    Fish farm manager: UNEMPLOYED  Tobacco Use  . Smoking status: Never Smoker  . Smokeless tobacco: Never Used  Substance and Sexual Activity  . Alcohol use: Yes    Comment: social   . Drug use: No  . Sexual activity: Yes    Partners: Male    Birth control/protection: Surgical    Comment: Hyst  Other Topics Concern   . Not on file  Social History Narrative  . Not on file   Social Determinants of Health   Financial Resource Strain:   . Difficulty of Paying Living Expenses: Not on file  Food Insecurity:   . Worried About Charity fundraiser in the Last Year: Not on file  . Ran Out of Food in the Last Year: Not on file  Transportation Needs:   . Lack of Transportation (Medical): Not on file  . Lack of Transportation (Non-Medical): Not on file  Physical Activity:   . Days of Exercise per Week: Not on file  . Minutes of Exercise per Session: Not on file  Stress:   . Feeling of Stress : Not on file  Social Connections:   . Frequency of Communication with Friends and Family: Not on file  . Frequency of Social Gatherings with Friends and Family: Not on file  . Attends Religious Services: Not on file  . Active Member of Clubs or Organizations: Not on file  . Attends Archivist Meetings: Not on file  . Marital Status: Not on file  Intimate Partner Violence:   . Fear of Current or Ex-Partner: Not on file  . Emotionally Abused: Not on file  . Physically Abused: Not on file  . Sexually Abused: Not on file    Review of Systems  Genitourinary: Positive for frequency.  Burning with urination  All other systems reviewed and are negative.   PHYSICAL EXAMINATION:    BP 122/80   Pulse 76   Temp (!) 97.2 F (36.2 C) (Temporal)   Ht 5' 5.5" (1.664 m)   Wt 142 lb 6.4 oz (64.6 kg)   LMP 11/07/1980   BMI 23.34 kg/m     General appearance:  Distressed and crying at times during the visit.  Appropriate conversation.   Breasts: Normal appearance.   Scars consistent with bilateral mammoplasty.  No masses.  Tenderness at 4:00 left breast.  No nipple retraction or dimpling.  No nipple discharge or bleeding. No axillary or supraclavicular adenopathy  Pelvic: External genitalia:  no lesions              Urethra:  normal appearing urethra with no masses, tenderness or lesions               Bartholins and Skenes: normal                 Vagina: normal appearing vagina with normal color and discharge, no lesions              Cervix: absent.                Bimanual Exam:  Uterus:  Absent.              Adnexa: no mass, fullness, tenderness          Chaperone was present for exam.  ASSESSMENT  Status post TAH.  Status post RSO and status post LSO. Cystitis.  Abnormal urine today. Vaginal discharge. Distress due to pain from IC. Left breast pain.   PLAN  Bactrim DS.  Urine micro and culture.  Rx for Diflucan 150 mg for her to take if she develops a yeast infection following her tx for bladder infection.  She will restart Zoloft 25 mg by mouth daily.  Refills until annual exam is due. Affirm taken.  Start vaginal estrogen again.   Will schedule dx left mammogram and left breast US through Triage. FU for well woman visit in May 2021. She will contact Dr. Amalia Hailey if her pain persists despite treatment for current suspected UTI.   An After Visit Summary was printed and given to the patient.  ___25___ minutes face to face time of which over 50% was spent in counseling.

## 2019-11-06 ENCOUNTER — Encounter: Payer: Self-pay | Admitting: Obstetrics and Gynecology

## 2019-11-06 ENCOUNTER — Ambulatory Visit (INDEPENDENT_AMBULATORY_CARE_PROVIDER_SITE_OTHER): Payer: BC Managed Care – PPO | Admitting: Obstetrics and Gynecology

## 2019-11-06 ENCOUNTER — Telehealth: Payer: Self-pay | Admitting: Obstetrics and Gynecology

## 2019-11-06 ENCOUNTER — Other Ambulatory Visit: Payer: Self-pay

## 2019-11-06 VITALS — BP 122/80 | HR 76 | Temp 97.2°F | Ht 65.5 in | Wt 142.4 lb

## 2019-11-06 DIAGNOSIS — R350 Frequency of micturition: Secondary | ICD-10-CM

## 2019-11-06 DIAGNOSIS — N898 Other specified noninflammatory disorders of vagina: Secondary | ICD-10-CM | POA: Diagnosis not present

## 2019-11-06 DIAGNOSIS — R8290 Unspecified abnormal findings in urine: Secondary | ICD-10-CM

## 2019-11-06 DIAGNOSIS — N644 Mastodynia: Secondary | ICD-10-CM

## 2019-11-06 DIAGNOSIS — Z789 Other specified health status: Secondary | ICD-10-CM

## 2019-11-06 LAB — POCT URINALYSIS DIPSTICK
Bilirubin, UA: NEGATIVE
Glucose, UA: NEGATIVE
Ketones, UA: NEGATIVE
Leukocytes, UA: NEGATIVE
Nitrite, UA: POSITIVE
Protein, UA: NEGATIVE
Urobilinogen, UA: 0.2 E.U./dL
pH, UA: 5 (ref 5.0–8.0)

## 2019-11-06 MED ORDER — FLUCONAZOLE 150 MG PO TABS: 150.0000 mg | ORAL_TABLET | Freq: Once | ORAL | 0 refills | Status: AC

## 2019-11-06 MED ORDER — SULFAMETHOXAZOLE-TRIMETHOPRIM 800-160 MG PO TABS: 1.0000 | ORAL_TABLET | Freq: Two times a day (BID) | ORAL | 0 refills | Status: DC

## 2019-11-06 MED ORDER — SERTRALINE HCL 25 MG PO TABS: 25.0000 mg | ORAL_TABLET | Freq: Every day | ORAL | 1 refills | Status: DC

## 2019-11-06 NOTE — Telephone Encounter (Signed)
Per review of Epic, patient scheduled for Dx MMG on 11/11/19 at 3:30pm.   Call placed to patient to confirm she recived appt details. No answer, left detailed message, ok per dpr. Advised of appt, return call to office if any additional questions.   Placed in [place] hold.   Routing to provider for final review. Patient is agreeable to disposition. Will close encounter.

## 2019-11-06 NOTE — Telephone Encounter (Signed)
Please schedule a dx left mammogram and left breast US for left breast pain at about 4:00.   [facility]

## 2019-11-06 NOTE — Telephone Encounter (Addendum)
Spoke with Arena at [facility]. No appointments in the upcoming week. Lonzo Candy will work on appt availability and contact the patient directly with appt information.   Orders placed for left breast Dx MMG and left breast US

## 2019-11-07 LAB — VAGINITIS/VAGINOSIS, DNA PROBE
Candida Species: NEGATIVE
Gardnerella vaginalis: NEGATIVE
Trichomonas vaginosis: NEGATIVE

## 2019-11-07 LAB — URINALYSIS, MICROSCOPIC ONLY
Casts: NONE SEEN /LPF
WBC, UA: >30 /HPF — AB (ref 0–5)

## 2019-11-09 LAB — URINE CULTURE

## 2019-11-10 ENCOUNTER — Telehealth: Payer: Self-pay | Admitting: Obstetrics and Gynecology

## 2019-11-10 MED ORDER — NITROFURANTOIN MONOHYD MACRO 100 MG PO CAPS: 100.0000 mg | ORAL_CAPSULE | Freq: Two times a day (BID) | ORAL | 0 refills | Status: DC

## 2019-11-10 NOTE — Telephone Encounter (Signed)
Phone call to patient after hours with UC result. She has E Coli resistant to Bactrim.  She has pain in her urethra and some low back pain.   Rx for Macrobid 100 mg po bid x 1 week sent to her pharmacy.   She will call back if she is not feeling better.

## 2019-11-11 ENCOUNTER — Encounter: Payer: Self-pay | Admitting: Cardiovascular Disease

## 2019-11-11 ENCOUNTER — Ambulatory Visit
Admission: RE | Admit: 2019-11-11 | Discharge: 2019-11-11 | Disposition: A | Discharge status: Home / Self Care | Payer: BC Managed Care – PPO | Source: Ambulatory Visit | Attending: Obstetrics and Gynecology | Admitting: Obstetrics and Gynecology

## 2019-11-11 ENCOUNTER — Ambulatory Visit: Payer: BC Managed Care – PPO

## 2019-11-11 ENCOUNTER — Ambulatory Visit (INDEPENDENT_AMBULATORY_CARE_PROVIDER_SITE_OTHER): Payer: BC Managed Care – PPO | Admitting: Cardiovascular Disease

## 2019-11-11 ENCOUNTER — Other Ambulatory Visit: Payer: Self-pay

## 2019-11-11 VITALS — BP 110/80 | HR 65 | Ht 65.5 in | Wt 142.8 lb

## 2019-11-11 DIAGNOSIS — N644 Mastodynia: Secondary | ICD-10-CM

## 2019-11-11 DIAGNOSIS — I471 Supraventricular tachycardia: Secondary | ICD-10-CM

## 2019-11-11 DIAGNOSIS — E782 Mixed hyperlipidemia: Secondary | ICD-10-CM

## 2019-11-11 DIAGNOSIS — R002 Palpitations: Secondary | ICD-10-CM | POA: Diagnosis not present

## 2019-11-11 DIAGNOSIS — I10 Essential (primary) hypertension: Secondary | ICD-10-CM | POA: Diagnosis not present

## 2019-11-11 DIAGNOSIS — I5032 Chronic diastolic (congestive) heart failure: Secondary | ICD-10-CM

## 2019-11-11 DIAGNOSIS — R000 Tachycardia, unspecified: Secondary | ICD-10-CM | POA: Diagnosis not present

## 2019-11-11 DIAGNOSIS — R928 Other abnormal and inconclusive findings on diagnostic imaging of breast: Secondary | ICD-10-CM | POA: Diagnosis not present

## 2019-11-11 NOTE — Patient Instructions (Signed)
Medication Instructions:  Your physician recommends that you continue on your current medications as directed. Please refer to the Current Medication list given to you today.  *If you need a refill on your cardiac medications before your next appointment, please call your pharmacy*   Lab Work: None Ordered    Testing/Procedures: None Ordered   Follow-Up: At [facility], you and your health needs are our priority.  As part of our continuing mission to provide you with exceptional heart care, we have created designated Provider Care Teams.  These Care Teams include your primary Cardiologist (physician) and Advanced Practice Providers (APPs -  Physician Assistants and Nurse Practitioners) who all work together to provide you with the care you need, when you need it.  Your next appointment:   1 year(s)  The format for your next appointment:   In Person  Provider:   You may see Mertie Moores, MD or one of the following Advanced Practice Providers on your designated Care Team:    Richardson Dopp, PA-C  [place], [place]  Daune Perch, [place]

## 2019-11-11 NOTE — Progress Notes (Signed)
Lauren Montes Date of Birth  June 25, 1956 [facility] / [facility] [address] .[place], [place]  [address] [phone]  Fax  [phone]  1. Hyperlipidemia 2. Sinus tachycardia, SVT 3. Chronic asthma     Lauren Montes is a middle-age female the history of mild diastolic dysfunction as well as leg edema. She also has a history of hypercholesterolemia.   Has a history of hyperlipidemia. We have had her on Crestor the past which she tolerated very well. Her insurance and he refused to pay for to try her on atorvastatin. This caused her to have lots of muscle aches as well as memory loss. We discontinued the atorvastatin at that time.  She has been recording her heart rate and blood pressure readings. Her heart rate is frequently above 100. He also has many readings in the 80-90 range.  Has bruising particularly on her hands and on her fingers. [name] never had any easy bleeding. She did not have any bleeding  complications. during her pregnancies or deliveries.  Feb. 4, 2014: Lauren Montes is doing well.  Her HR and BP have been well controlled.   June 17, 2013:  Lauren Montes is doing well.  She has started waking over the past several weeks.    June 21, 2014:  Lauren Montes is having some issues with DOE.    Perhaps slight chest pressure with exertion.  No pain. .  Sleeps on 2 pillows occasionally at night - not every night.  [name] very careful with her salt intake. She and her husband thinks most of her meals of scratch. She denies any leg swelling   Nov. 17, 2015:  Lauren Montes is doing well.  She is seen for chronic dyspnea. Her echo was normal.  myoview showed no ischemia Occasional heart racing.  Not severe.  Does not take any meds.  Resolves after several minutes. She is walking for exercise - 30-45 minutes 3 times a week.  Also do some strength training  Nov. 17, 2016: Overall doing ok Lots of stress - aging parents  Lots of muscle cramps in  legs and feet.   Feb. 27, 2017:  Lauren Montes is seen back  Cholesterol is back up - she was not able to takes atorvastatin or Pravachol because of severe muscle aches and cramping  And mental foggyness. She has  Tried  Crestor in the past and it caused muscle cramps as well . [name] now on Zetia.  May 18 , 2017:  Lauren Montes is seen today for a work in visit.  She has been having lots of swelling in her feet and hands ( feet > hands)  We gave her Lasix and Kdur to take for several days.  She took the Lasix and Kdur for 2 days .   Has not needed it sense  has had some fatigue  Has not really eaten any more salt.  This has been occuring off and of for the past 6 weeks.  Is having palpitations at night  She has had increased anxiety.  Still able to do all of her usual activities without dyspnea.   She recently stopped taking her low dose estrogen ( 2-3 weeks ago )   Nov. 20, 2017:     No issues,  No SVT Takes 1/2 zoloft a day - anxiety may have been plaing a part  Has really cut down on the cholesterol in her diet  Getting exercise. Has lost 20 lbs.  Would like to  decrease the crestor to every other day instead of daily .  Nov. 20, 2018  Doing great No significant  Tachycardia Has greatly reduced her meat intake.   Has a seafood allergy Eats some chicken.    Lots of beans .     Nov. 20, 2019  Lauren Montes is seen today  Doing very well  BP and HR are well controlled.  Has occasional episodes of tachycardia , esp at night or if she gets overexerted. Has not been exercising  Seems to occur daily  Always when she lies down at night   Some of her symptoms sound like volume depletion She drinks water all day long but then says that she has heart pounding frequently through the day.  She is having issues with her IBS .  Has not been eating much protein   November 11, 2019: Lauren Montes is seen today for follow-up of her hypertension and supraventricular tachycardia. Has been diagnosed with  interstitial cyctitis .  Is on lots of pain meds.  Still able to exercise failry well Trying to walk regularly   Current Outpatient Medications  Medication Sig Dispense Refill  . cycloSPORINE (RESTASIS) 0.05 % ophthalmic emulsion Place 1 drop into both eyes 2 (two) times daily.    . diazepam (VALIUM) 5 MG tablet Take 5 mg by mouth every 6 (six) hours as needed for anxiety.    . dicyclomine (BENTYL) 10 MG capsule Take 1 capsule (10 mg total) by mouth as needed for spasms. 90 capsule 11  . diltiazem (CARDIZEM CD) 120 MG 24 hr capsule TAKE 1 CAPSULE BY MOUTH EVERY DAY (Patient taking differently: Take 120 mg by mouth daily. TAKE 1 CAPSULE BY MOUTH EVERY DAY) 90 capsule 3  . diltiazem (CARDIZEM) 30 MG tablet Take 1 tablet (30 mg total) by mouth 4 (four) times daily as needed. 60 tablet 11  . DULERA 200-5 MCG/ACT AERO Inhale 2 puffs into the lungs 2 (two) times daily.  5  . Estradiol 10 % CREA Apply 1 application topically as directed.     . famotidine (PEPCID) 20 MG tablet Take 20 mg by mouth as needed for heartburn or indigestion.    . gabapentin (NEURONTIN) 300 MG capsule TAKE 1 CAPSULE BY MOUTH EVERYDAY AT BEDTIME (Patient taking differently: Take 300 mg by mouth at bedtime. TAKE 1 CAPSULE BY MOUTH EVERYDAY AT BEDTIME) 90 capsule 0  . hydrocortisone 2.5 % cream Apply 1 application topically 2 (two) times daily.    . hydrOXYzine (ATARAX/VISTARIL) 10 MG tablet Take 10 mg by mouth at bedtime.     . Meth-Hyo-M Bl-Na Phos-Ph Sal (URO-MP) 118 MG CAPS Take 1 capsule by mouth 4 (four) times daily as needed (bladder).     . montelukast (SINGULAIR) 10 MG tablet Take 10 mg by mouth at bedtime.      . nitrofurantoin, macrocrystal-monohydrate, (MACROBID) 100 MG capsule Take 1 capsule (100 mg total) by mouth 2 (two) times daily. Take one capsule by mouth twice a day for one week. 14 capsule 0  . Omega-3 Fatty Acids (OMEGA 3 PO) Take 1 capsule by mouth daily.      Marland Kitchen PROAIR HFA 108 (90 BASE) MCG/ACT inhaler  Inhale 2 puffs into the lungs every 6 (six) hours as needed for wheezing or shortness of breath.   11  . rosuvastatin (CRESTOR) 5 MG tablet TAKE 1 TABLET (5 MG TOTAL) BY MOUTH DAILY AT 6 PM. 90 tablet 1  . sertraline (ZOLOFT) 25 MG tablet Take 1 tablet (  25 mg total) by mouth daily. 90 tablet 1  . sulfamethoxazole-trimethoprim (BACTRIM DS) 800-160 MG tablet Take 1 tablet by mouth 2 (two) times daily. One PO BID x 3 days 6 tablet 0  . tiZANidine (ZANAFLEX) 4 MG tablet Take 1 tablet (4 mg total) by mouth at bedtime. 30 tablet 0   Current Facility-Administered Medications  Medication Dose Route Frequency Provider Last Rate Last Admin  . 0.9 %  sodium chloride infusion  500 mL Intravenous Continuous Ladene Artist, MD         Allergies  Allergen Reactions  . Codeine Nausea And Vomiting    Migraine headache  . Lipitor [Atorvastatin] Other (See Comments)    Muscle ache, mind foggy,   . Latex Hives and Rash    Past Medical History:  Diagnosis Date  . Asthma   . Atrophic vaginitis   . Blood transfusion without reported diagnosis 1975   with vaginal delivery--very anemic  . Cystic teratoma   . Elevated cholesterol   . Endometriosis   . Esophageal ulcer   . Fibromyalgia   . Fundic gland polyps of stomach, benign   . GERD (gastroesophageal reflux disease)   . History of diastolic dysfunction   . History of migraine headaches   . Leg edema    left leg  . Ocular rosacea   . Osteoarthritis    back,hands, knees  . Overactive bladder   . Raynaud's disease   . Status post cystoscopy 09/06/2019   [facility]  . Tachycardia   . Tubular adenoma of colon 08/2016    Past Surgical History:  Procedure Laterality Date  . APPENDECTOMY    . BACK SURGERY    . BREAST EXCISIONAL BIOPSY Left   . BREAST SURGERY  10-09-13   breast reduction--Dr. Dessie Coma  . BUNIONECTOMY     bilateral  . CARDIOVASCULAR STRESS TEST  07/02/2008   EF 55-60%  . colonscopy    . DENTAL SURGERY  2007  .  ESOPHAGOGASTRODUODENOSCOPY ENDOSCOPY    . FOOT SURGERY     LEFT FOOT  . left breast mass removed    . OOPHORECTOMY  1982   RSO partial and LSO, uterus  . REDUCTION MAMMAPLASTY Bilateral   . RSO  2002   partial  . ruptured disc surgery  07,08  . TOTAL ABDOMINAL HYSTERECTOMY      Social History   Tobacco Use  Smoking Status Never Smoker  Smokeless Tobacco Never Used    Social History   Substance and Sexual Activity  Alcohol Use Yes   Comment: social     Family History  Problem Relation Age of Onset  . Coronary artery disease Father   . Hypertension Father   . Heart disease Father   . Heart attack Father   . Asthma Mother   . Hypertension Mother   . Alzheimer's disease Maternal Grandmother   . Lung cancer Maternal Grandfather   . Heart attack Maternal Grandfather   . Heart failure Paternal Grandfather   . Heart attack Paternal Grandfather   . Skin cancer Sister        melanoma  . Hepatitis C Brother   . Skin cancer Brother        melanoma  . Pancreatic cancer Maternal Aunt   . Colon cancer Neg Hx   . Colon polyps Neg Hx   . Rectal cancer Neg Hx   . Stomach cancer Neg Hx   . Breast cancer Neg Hx     Reviw  of Systems:  Reviewed in the HPI.  All other systems are negative.   Physical Exam: Blood pressure 110/80, pulse 65, height 5' 5.5" (1.664 m), weight 142 lb 12.8 oz (64.8 kg), last menstrual period 11/07/1980, SpO2 98 %.  GEN:  Well nourished, well developed in no acute distress HEENT: Normal NECK: No JVD; No carotid bruits LYMPHATICS: No lymphadenopathy CARDIAC: RR, softy systolic murmur  RESPIRATORY:  Clear to auscultation without rales, wheezing or rhonchi  ABDOMEN: Soft, non-tender, non-distended MUSCULOSKELETAL:  No edema; No deformity  SKIN: Warm and dry NEUROLOGIC:  Alert and oriented x 3     ECG:    November 11, 2019: Normal sinus rhythm at 65.  No ST or T wave changes.  Assessment / Plan:   1.chronic diastolic CHF:     Seems to be  stable.   Cont current meds.   2. Essential hypertension:    BP is well controlled.   3. Hyperlipidemia:    Labs have been stable Check labs in 6 months at her next appt    4. Supraventricular  tachycardia: Stable. On Diltiazem 120 mg a day  No recent episodes      Mertie Moores, MD  11/11/2019 9:42 AM    [address],  [place], [place]  [address] Pager [phone] Phone: [phone]; Fax: [phone]

## 2019-11-24 ENCOUNTER — Encounter: Payer: Self-pay | Admitting: Obstetrics and Gynecology

## 2019-11-25 ENCOUNTER — Telehealth: Payer: Self-pay | Admitting: *Deleted

## 2019-11-25 NOTE — Telephone Encounter (Signed)
Spoke with patient in f/u to 11/24/19 MyChart message. Offered OV for today at 9:30am, patient declines. OV scheduled for 1/19 at 11:30am with Dr. Quincy Simmonds. [id number] prescreen negative, precautions reviewed.   Routing to provider for final review. Patient is agreeable to disposition. Will close encounter.

## 2019-11-25 NOTE — Progress Notes (Signed)
GYNECOLOGY  VISIT   HPI: 64 y.o.   Married  Caucasian  female   G1P1001 with Patient's last menstrual period was 11/07/1980.   here for vaginal itching.   She was treated recently for a UTI with Bactrim and then Macrobid.   Having some lower back pain and vaginal itching.  No discharge.  Used Diflucan x 2, last one was 5 days ago.  This did relieve itching.  She tried some Monistat during the weekend.  Itching is less intense now.   States her bladder is feeling good for the last 6 days.  She has IC.  She has follow up with her urology PA.   Not using vaginal estrogens now.   Urine dip - negative.  GYNECOLOGIC HISTORY: Patient's last menstrual period was 11/07/1980. Contraception: Hysterectomy Menopausal hormone therapy:  Estrogen cream Last mammogram:  09-10-19 3D/Neg/density B/BiRads1 Last pap smear: 03-06-15 Neg:Neg HR HPV, 02-07-11 Neg        OB History    Gravida  1   Para  1   Term  1   Preterm      AB      Living  1     SAB      TAB      Ectopic      Multiple      Live Births                 Patient Active Problem List   Diagnosis Date Noted  . Chest tightness 02/21/2019  . SVT (supraventricular tachycardia) ([place]) 09/26/2016  . HTN (hypertension) 09/24/2015  . Palpitations 03/05/2015  . Fibromyalgia 03/05/2015  . Chronic diastolic CHF (congestive heart failure), NYHA class 2 ([facility]) 06/20/2014  . Sinus tachycardia 09/14/2012  . Dysphagia 06/29/2012  . Ocular rosacea   . GERD (gastroesophageal reflux disease)   . Cough 09/09/2011  . Chest tightness, discomfort, or pressure 09/09/2011  . Hyperlipidemia 05/10/2011  . SWELLING OF LIMB 01/20/2011  . GERD 02/03/2010  . ABDOMINAL PAIN-EPIGASTRIC 02/03/2010  . ABDOMINAL PAIN-MULTIPLE SITES 02/03/2010  . CONSTIPATION 09/19/2008  . ABDOMINAL PAIN, LEFT LOWER QUADRANT 09/19/2008  . COLONIC POLYPS, HYPERPLASTIC, HX OF 09/19/2008    Past Medical History:  Diagnosis Date  . Asthma   .  Atrophic vaginitis   . Blood transfusion without reported diagnosis 1975   with vaginal delivery--very anemic  . Cystic teratoma   . Elevated cholesterol   . Endometriosis   . Esophageal ulcer   . Fibromyalgia   . Fundic gland polyps of stomach, benign   . GERD (gastroesophageal reflux disease)   . History of diastolic dysfunction   . History of migraine headaches   . Leg edema    left leg  . Ocular rosacea   . Osteoarthritis    back,hands, knees  . Overactive bladder   . Raynaud's disease   . Status post cystoscopy 09/06/2019   [facility]  . Tachycardia   . Tubular adenoma of colon 08/2016    Past Surgical History:  Procedure Laterality Date  . APPENDECTOMY    . BACK SURGERY    . BREAST EXCISIONAL BIOPSY Left   . BREAST SURGERY  10-09-13   breast reduction--Dr. Dessie Coma  . BUNIONECTOMY     bilateral  . CARDIOVASCULAR STRESS TEST  07/02/2008   EF 55-60%  . colonscopy    . DENTAL SURGERY  2007  . ESOPHAGOGASTRODUODENOSCOPY ENDOSCOPY    . FOOT SURGERY     LEFT FOOT  . left breast  mass removed    . OOPHORECTOMY  1982   RSO partial and LSO, uterus  . REDUCTION MAMMAPLASTY Bilateral   . RSO  2002   partial  . ruptured disc surgery  07,08  . TOTAL ABDOMINAL HYSTERECTOMY      Current Outpatient Medications  Medication Sig Dispense Refill  . cycloSPORINE (RESTASIS) 0.05 % ophthalmic emulsion Place 1 drop into both eyes 2 (two) times daily.    . diazepam (VALIUM) 5 MG tablet Take 5 mg by mouth every 6 (six) hours as needed for anxiety.    . dicyclomine (BENTYL) 10 MG capsule Take 1 capsule (10 mg total) by mouth as needed for spasms. 90 capsule 11  . diltiazem (CARDIZEM CD) 120 MG 24 hr capsule TAKE 1 CAPSULE BY MOUTH EVERY DAY (Patient taking differently: Take 120 mg by mouth daily. TAKE 1 CAPSULE BY MOUTH EVERY DAY) 90 capsule 3  . diltiazem (CARDIZEM) 30 MG tablet Take 1 tablet (30 mg total) by mouth 4 (four) times daily as needed. 60 tablet 11  . DULERA 200-5  MCG/ACT AERO Inhale 2 puffs into the lungs 2 (two) times daily.  5  . Estradiol 10 % CREA Apply 1 application topically as directed.     . famotidine (PEPCID) 20 MG tablet Take 20 mg by mouth as needed for heartburn or indigestion.    . gabapentin (NEURONTIN) 300 MG capsule TAKE 1 CAPSULE BY MOUTH EVERYDAY AT BEDTIME (Patient taking differently: Take 300 mg by mouth at bedtime. TAKE 1 CAPSULE BY MOUTH EVERYDAY AT BEDTIME) 90 capsule 0  . hydrocortisone 2.5 % cream Apply 1 application topically 2 (two) times daily.    . Meth-Hyo-M Bl-Na Phos-Ph Sal (URO-MP) 118 MG CAPS Take 1 capsule by mouth 4 (four) times daily as needed (bladder).     . montelukast (SINGULAIR) 10 MG tablet Take 10 mg by mouth at bedtime.      . Omega-3 Fatty Acids (OMEGA 3 PO) Take 1 capsule by mouth daily.      Marland Kitchen PROAIR HFA 108 (90 BASE) MCG/ACT inhaler Inhale 2 puffs into the lungs every 6 (six) hours as needed for wheezing or shortness of breath.   11  . rosuvastatin (CRESTOR) 5 MG tablet TAKE 1 TABLET (5 MG TOTAL) BY MOUTH DAILY AT 6 PM. 90 tablet 1  . sertraline (ZOLOFT) 25 MG tablet Take 1 tablet (25 mg total) by mouth daily. 90 tablet 1  . tiZANidine (ZANAFLEX) 4 MG tablet Take 1 tablet (4 mg total) by mouth at bedtime. 30 tablet 0   Current Facility-Administered Medications  Medication Dose Route Frequency Provider Last Rate Last Admin  . 0.9 %  sodium chloride infusion  500 mL Intravenous Continuous Ladene Artist, MD         ALLERGIES: Codeine, Lipitor [atorvastatin], and Latex  Family History  Problem Relation Age of Onset  . Coronary artery disease Father   . Hypertension Father   . Heart disease Father   . Heart attack Father   . Asthma Mother   . Hypertension Mother   . Alzheimer's disease Maternal Grandmother   . Lung cancer Maternal Grandfather   . Heart attack Maternal Grandfather   . Heart failure Paternal Grandfather   . Heart attack Paternal Grandfather   . Skin cancer Sister        melanoma   . Hepatitis C Brother   . Skin cancer Brother        melanoma  . Pancreatic cancer Maternal Aunt   .  Colon cancer Neg Hx   . Colon polyps Neg Hx   . Rectal cancer Neg Hx   . Stomach cancer Neg Hx   . Breast cancer Neg Hx     Social History   Socioeconomic History  . Marital status: Married    Spouse name: Not on file  . Number of children: 1  . Years of education: Not on file  . Highest education level: Not on file  Occupational History  . Occupation: retired    Fish farm manager: UNEMPLOYED  Tobacco Use  . Smoking status: Never Smoker  . Smokeless tobacco: Never Used  Substance and Sexual Activity  . Alcohol use: Yes    Comment: social   . Drug use: No  . Sexual activity: Yes    Partners: Male    Birth control/protection: Surgical    Comment: Hyst  Other Topics Concern  . Not on file  Social History Narrative  . Not on file   Social Determinants of Health   Financial Resource Strain:   . Difficulty of Paying Living Expenses: Not on file  Food Insecurity:   . Worried About Charity fundraiser in the Last Year: Not on file  . Ran Out of Food in the Last Year: Not on file  Transportation Needs:   . Lack of Transportation (Medical): Not on file  . Lack of Transportation (Non-Medical): Not on file  Physical Activity:   . Days of Exercise per Week: Not on file  . Minutes of Exercise per Session: Not on file  Stress:   . Feeling of Stress : Not on file  Social Connections:   . Frequency of Communication with Friends and Family: Not on file  . Frequency of Social Gatherings with Friends and Family: Not on file  . Attends Religious Services: Not on file  . Active Member of Clubs or Organizations: Not on file  . Attends Archivist Meetings: Not on file  . Marital Status: Not on file  Intimate Partner Violence:   . Fear of Current or Ex-Partner: Not on file  . Emotionally Abused: Not on file  . Physically Abused: Not on file  . Sexually Abused: Not on file     Review of Systems  All other systems reviewed and are negative.   PHYSICAL EXAMINATION:    BP 118/82   Pulse 76   Temp (!) 96.3 F (35.7 C) (Temporal)   Ht 5' 5.5" (1.664 m)   Wt 140 lb 9.6 oz (63.8 kg)   LMP 11/07/1980   BMI 23.04 kg/m     General appearance: alert, cooperative and appears stated age    Pelvic: External genitalia:  no lesions              Urethra:  normal appearing urethra with no masses, tenderness or lesions              Bartholins and Skenes: normal                 Vagina: normal appearing vagina with normal color and discharge, no lesions              Cervix: absent                Bimanual Exam:  Uterus: absent             Adnexa: no mass, fullness, tenderness             Chaperone was present for exam.  ASSESSMENT  Vaginitis.  Recent UTI.  Interstitial cystitis.   PLAN  Affirm.  Will wait to treat after results are back.  If her Affirm is negative, she will restart her vaginal estrogen.  We discussed medication to treat bladder spasms and [place] specifically for IC.  We reviewed dietary items that can flare IC.   An After Visit Summary was printed and given to the patient.  _20____ minutes face to face time of which over 50% was spent in counseling.

## 2019-11-26 ENCOUNTER — Encounter: Payer: Self-pay | Admitting: Obstetrics and Gynecology

## 2019-11-26 ENCOUNTER — Other Ambulatory Visit: Payer: Self-pay

## 2019-11-26 ENCOUNTER — Ambulatory Visit (INDEPENDENT_AMBULATORY_CARE_PROVIDER_SITE_OTHER): Payer: BC Managed Care – PPO | Admitting: Obstetrics and Gynecology

## 2019-11-26 VITALS — BP 118/82 | HR 76 | Temp 96.3°F | Ht 65.5 in | Wt 140.6 lb

## 2019-11-26 DIAGNOSIS — N761 Subacute and chronic vaginitis: Secondary | ICD-10-CM

## 2019-11-26 DIAGNOSIS — N3010 Interstitial cystitis (chronic) without hematuria: Secondary | ICD-10-CM

## 2019-11-26 LAB — POCT URINALYSIS DIPSTICK
Bilirubin, UA: NEGATIVE
Blood, UA: NEGATIVE
Clarity, UA: NEGATIVE
Glucose, UA: NEGATIVE
Ketones, UA: NEGATIVE
Leukocytes, UA: NEGATIVE
Nitrite, UA: NEGATIVE
Protein, UA: NEGATIVE
Urobilinogen, UA: 0.2 E.U./dL
pH, UA: 5 (ref 5.0–8.0)

## 2019-11-27 LAB — VAGINITIS/VAGINOSIS, DNA PROBE
Candida Species: NEGATIVE
Gardnerella vaginalis: NEGATIVE
Trichomonas vaginosis: NEGATIVE

## 2019-12-02 DIAGNOSIS — N390 Urinary tract infection, site not specified: Secondary | ICD-10-CM | POA: Diagnosis not present

## 2019-12-02 DIAGNOSIS — R3989 Other symptoms and signs involving the genitourinary system: Secondary | ICD-10-CM | POA: Diagnosis not present

## 2019-12-02 DIAGNOSIS — N9419 Other specified dyspareunia: Secondary | ICD-10-CM | POA: Diagnosis not present

## 2019-12-09 ENCOUNTER — Other Ambulatory Visit: Payer: Self-pay | Admitting: Cardiovascular Disease

## 2019-12-18 DIAGNOSIS — L503 Dermatographic urticaria: Secondary | ICD-10-CM | POA: Diagnosis not present

## 2019-12-18 DIAGNOSIS — J3089 Other allergic rhinitis: Secondary | ICD-10-CM | POA: Diagnosis not present

## 2019-12-18 DIAGNOSIS — J4540 Moderate persistent asthma, uncomplicated: Secondary | ICD-10-CM | POA: Diagnosis not present

## 2019-12-25 DIAGNOSIS — M6281 Muscle weakness (generalized): Secondary | ICD-10-CM | POA: Diagnosis not present

## 2019-12-25 DIAGNOSIS — N3010 Interstitial cystitis (chronic) without hematuria: Secondary | ICD-10-CM | POA: Diagnosis not present

## 2019-12-25 DIAGNOSIS — M62838 Other muscle spasm: Secondary | ICD-10-CM | POA: Diagnosis not present

## 2019-12-25 DIAGNOSIS — M6289 Other specified disorders of muscle: Secondary | ICD-10-CM | POA: Diagnosis not present

## 2020-01-06 ENCOUNTER — Encounter: Payer: Self-pay | Admitting: Rheumatology

## 2020-01-06 DIAGNOSIS — Z0000 Encounter for general adult medical examination without abnormal findings: Secondary | ICD-10-CM | POA: Diagnosis not present

## 2020-01-06 DIAGNOSIS — E7800 Pure hypercholesterolemia, unspecified: Secondary | ICD-10-CM | POA: Diagnosis not present

## 2020-01-07 DIAGNOSIS — E7800 Pure hypercholesterolemia, unspecified: Secondary | ICD-10-CM | POA: Diagnosis not present

## 2020-01-07 DIAGNOSIS — R8290 Unspecified abnormal findings in urine: Secondary | ICD-10-CM | POA: Diagnosis not present

## 2020-01-09 NOTE — Progress Notes (Signed)
Office Visit Note  Patient: Lauren Montes             Date of Birth: [birth year]           MRN: [id number]             PCP: Haywood Pao, MD Referring: Haywood Pao, MD Visit Date: 01/16/2020 Occupation: @GUAROCC @  Subjective:  Left trochanteric bursitis   History of Present Illness: Lauren Montes is a 64 y.o. female with history of fibromyalgia and DDD.  She continues to take gabapentin 300 mg 1 capsule by mouth at bedtime and Zanaflex 4 mg a mouth at bedtime as needed for muscle spasms.  According to the patient this has been an effective commendation of medications.  She states her fibromyalgia pain has been very tolerable.  She has been experiencing frequent interstitial cystitis flares.  She is following along closely with Dr. Amalia Hailey.  According to the patient she follows a strict diet and has tried several different pain medications without much relief.  She presents today with left trochanter bursitis.  She has been performing stretching exercises daily as well as yoga on a regular basis.  She would like a cortisone injection.  Activities of Daily Living:  Patient reports morning stiffness for 0 none.   Patient Denies nocturnal pain.  Difficulty dressing/grooming: Denies Difficulty climbing stairs: Denies Difficulty getting out of chair: Denies Difficulty using hands for taps, buttons, cutlery, and/or writing: Denies  Review of Systems  Constitutional: Positive for fatigue.  HENT: Negative for mouth sores, mouth dryness and nose dryness.   Eyes: Positive for dryness. Negative for pain and visual disturbance.  Respiratory: Negative for cough, hemoptysis, shortness of breath and difficulty breathing.   Cardiovascular: Negative for chest pain, palpitations, hypertension and swelling in legs/feet.  Gastrointestinal: Negative for blood in stool, constipation and diarrhea.  Endocrine: Negative for increased urination.  Genitourinary: Positive for  difficulty urinating (Hx of IC) and painful urination (Hx of IC).  Musculoskeletal: Negative for arthralgias, joint pain, joint swelling, myalgias, muscle weakness, morning stiffness, muscle tenderness and myalgias.  Skin: Positive for rash. Negative for color change, pallor, hair loss, nodules/bumps, skin tightness, ulcers and sensitivity to sunlight.  Allergic/Immunologic: Negative for susceptible to infections.  Neurological: Negative for dizziness, numbness, headaches and weakness.  Hematological: Negative for bruising/bleeding tendency and swollen glands.  Psychiatric/Behavioral: Negative for depressed mood and sleep disturbance. The patient is not nervous/anxious.     PMFS History:  Patient Active Problem List   Diagnosis Date Noted  . Chest tightness 02/21/2019  . SVT (supraventricular tachycardia) ([place]) 09/26/2016  . HTN (hypertension) 09/24/2015  . Palpitations 03/05/2015  . Fibromyalgia 03/05/2015  . Chronic diastolic CHF (congestive heart failure), NYHA class 2 ([place]) 06/20/2014  . Sinus tachycardia 09/14/2012  . Dysphagia 06/29/2012  . Ocular rosacea   . GERD (gastroesophageal reflux disease)   . Cough 09/09/2011  . Chest tightness, discomfort, or pressure 09/09/2011  . Hyperlipidemia 05/10/2011  . SWELLING OF LIMB 01/20/2011  . GERD 02/03/2010  . ABDOMINAL PAIN-EPIGASTRIC 02/03/2010  . ABDOMINAL PAIN-MULTIPLE SITES 02/03/2010  . CONSTIPATION 09/19/2008  . ABDOMINAL PAIN, LEFT LOWER QUADRANT 09/19/2008  . COLONIC POLYPS, HYPERPLASTIC, HX OF 09/19/2008    Past Medical History:  Diagnosis Date  . Asthma   . Atrophic vaginitis   . Blood transfusion without reported diagnosis 1975   with vaginal delivery--very anemic  . Cystic teratoma   . Elevated cholesterol   . Endometriosis   . Esophageal ulcer   .  Fibromyalgia   . Fundic gland polyps of stomach, benign   . GERD (gastroesophageal reflux disease)   . History of diastolic dysfunction   . History of migraine  headaches   . Leg edema    left leg  . Ocular rosacea   . Osteoarthritis    back,hands, knees  . Overactive bladder   . Raynaud's disease   . Status post cystoscopy 09/06/2019   [facility]  . Tachycardia   . Tubular adenoma of colon 08/2016    Family History  Problem Relation Age of Onset  . Coronary artery disease Father   . Hypertension Father   . Heart disease Father   . Heart attack Father   . Asthma Mother   . Hypertension Mother   . Alzheimer's disease Maternal Grandmother   . Lung cancer Maternal Grandfather   . Heart attack Maternal Grandfather   . Heart failure Paternal Grandfather   . Heart attack Paternal Grandfather   . Skin cancer Sister        melanoma  . Hepatitis C Brother   . Skin cancer Brother        melanoma  . Pancreatic cancer Maternal Aunt   . Colon cancer Neg Hx   . Colon polyps Neg Hx   . Rectal cancer Neg Hx   . Stomach cancer Neg Hx   . Breast cancer Neg Hx    Past Surgical History:  Procedure Laterality Date  . APPENDECTOMY    . BACK SURGERY    . BREAST EXCISIONAL BIOPSY Left   . BREAST SURGERY  10-09-13   breast reduction--Dr. Dessie Coma  . BUNIONECTOMY     bilateral  . CARDIOVASCULAR STRESS TEST  07/02/2008   EF 55-60%  . colonscopy    . DENTAL SURGERY  2007  . ESOPHAGOGASTRODUODENOSCOPY ENDOSCOPY    . FOOT SURGERY     LEFT FOOT  . left breast mass removed    . OOPHORECTOMY  1982   RSO partial and LSO, uterus  . REDUCTION MAMMAPLASTY Bilateral   . RSO  2002   partial  . ruptured disc surgery  07,08  . TOTAL ABDOMINAL HYSTERECTOMY     Social History   Social History Narrative  . Not on file   Immunization History  Administered Date(s) Administered  . Influenza Split 08/17/2011  . Zoster Recombinat (Shingrix) 07/08/2018, 01/03/2019     Objective: Vital Signs: BP (!) 93/52 (BP Location: Left Arm, Patient Position: Sitting, Cuff Size: Normal)   Pulse 70   Resp 14   Ht 5' 5.5" (1.664 m)   Wt 142 lb 3.2 oz (64.5 kg)    LMP 11/07/1980   BMI 23.30 kg/m    Physical Exam Vitals and nursing note reviewed.  Constitutional:      Appearance: She is well-developed.  HENT:     Head: Normocephalic and atraumatic.  Eyes:     Conjunctiva/sclera: Conjunctivae normal.  Pulmonary:     Effort: Pulmonary effort is normal.  Abdominal:     General: Bowel sounds are normal.     Palpations: Abdomen is soft.  Musculoskeletal:     Cervical back: Normal range of motion.  Lymphadenopathy:     Cervical: No cervical adenopathy.  Skin:    General: Skin is warm and dry.     Capillary Refill: Capillary refill takes less than 2 seconds.  Neurological:     Mental Status: She is alert and oriented to person, place, and time.  Psychiatric:  Behavior: Behavior normal.      Musculoskeletal Exam: C-spine, thoracic spine, lumbar spine good range of motion.  Shoulder joints, elbow joints, wrist joints, MCPs, PIPs, DIPs good range of motion with no synovitis.  She has complete fist formation bilaterally.  Hip joints have good range of motion with no discomfort.  She has tenderness over the left trochanteric bursa.  Knee joints have good range of motion with no warmth or effusion.  Ankle joints have good range of motion no tenderness or synovitis.  CDAI Exam: CDAI Score: -- Patient Global: --; Provider Global: -- Swollen: --; Tender: -- Joint Exam 01/16/2020   No joint exam has been documented for this visit   There is currently no information documented on the homunculus. Go to the Rheumatology activity and complete the homunculus joint exam.  Investigation: No additional findings.  Imaging: No results found.  Recent Labs: Lab Results  Component Value Date   WBC 7.1 08/15/2016   HGB 13.9 08/15/2016   PLT 248.0 08/15/2016   NA 145 (H) 09/26/2017   K 4.2 09/26/2017   CL 104 09/26/2017   CO2 24 09/26/2017   GLUCOSE 79 09/26/2017   BUN 10 09/26/2017   CREATININE 0.67 09/26/2017   BILITOT 0.5 09/26/2017    ALKPHOS 91 09/26/2017   AST 27 09/26/2017   ALT 23 09/26/2017   PROT 7.1 09/26/2017   ALBUMIN 4.8 09/26/2017   CALCIUM 9.8 09/26/2017   GFRAA 110 09/26/2017    Speciality Comments: No specialty comments available.  Procedures:  Large Joint Inj: L greater trochanter on 01/16/2020 1:28 PM Indications: pain Details: 27 G 1.5 in needle, lateral approach  Arthrogram: No  Medications: 40 mg triamcinolone acetonide 40 MG/ML; 1.5 mL lidocaine 1 % Aspirate: 0 mL Outcome: tolerated well, no immediate complications Procedure, treatment alternatives, risks and benefits explained, specific risks discussed. Consent was given by the patient. Immediately prior to procedure a time out was called to verify the correct patient, procedure, equipment, support staff and site/side marked as required. Patient was prepped and draped in the usual sterile fashion.     Allergies: Codeine, Lipitor [atorvastatin], and Latex   Assessment / Plan:     Visit Diagnoses: Fibromyalgia: She has not had any recent fibromyalgia flares.  Her discomfort has been very manageable.  She continues to take gabapentin 300 mg 1 capsule by mouth at bedtime and Zanaflex 4 mg by mouth at bedtime as needed for muscle spasms.  According to the patient this has been an effective combination of medications.  She presents today with left trochanteric bursitis and requested a cortisone injection.  She has been performing stretching exercises daily and participates in yoga on a regular basis.  We discussed the importance of regular exercise and good sleep hygiene.  She will follow-up in the office in 6 months.  Chronic fatigue: Chronic and stable.   Primary insomnia -She takes gabapentin 300 mg 1 capsule by mouth at bedtime and Zanaflex 4 mg by mouth at bedtime.  Good sleep hygiene was discussed.   DDD (degenerative disc disease), lumbar -  2 surgeries in the past performed by Dr. Sherwood Gambler.  MRI of the lumbar spine was obtained on 05/18/2013  which revealed postoperative change on the left at L4-L5.  She is not having any increased lower back pain at this time.  No symptoms of radiculopathy at this time.   Raynaud's disease without gangrene: Not currently active.  No digital ulcerations or signs of gangrene were noted.  She  has been wearing gloves on a regular basis.  Trochanteric bursitis of left hip: She has tenderness over the left trochanteric bursa.  She has been experiencing severe pain especially when lying on her side at night.  She is been performing stretching exercises daily and yoga on a regular basis.  Different treatment options were discussed and after, formed consent a left trochanter bursa cortisone injection was performed.  She tolerated the procedure well.  The procedure note completed above.  Aftercare was discussed.  She was encouraged to continue to perform stretching exercises daily and she can apply Voltaren gel topically as needed for pain relief.  She is starting physical therapy at [facility].    IC (interstitial cystitis): She has been experiencing frequent interstitial cystitis flares recently.  She has been following up with Dr. Amalia Hailey every 8 weeks.  She follows a strict diet and has been unable to identify triggers.  She has tried several different pain medications without much relief.  Other medical conditions are listed as follows  History of migraine  Ocular rosacea  History of hypercholesterolemia  History of gastroesophageal reflux (GERD)  History of asthma  History of CHF (congestive heart failure)  Orders: Orders Placed This Encounter  Procedures  . Large Joint Inj: L greater trochanter   No orders of the defined types were placed in this encounter.     Follow-Up Instructions: Return in about 6 months (around 07/18/2020) for Fibromyalgia.   Ofilia Neas, PA-C  Note - This record has been created using Dragon software.  Chart creation errors have been sought, but may not  always  have been located. Such creation errors do not reflect on  the standard of medical care.

## 2020-01-13 DIAGNOSIS — J4540 Moderate persistent asthma, uncomplicated: Secondary | ICD-10-CM | POA: Diagnosis not present

## 2020-01-13 DIAGNOSIS — Z0000 Encounter for general adult medical examination without abnormal findings: Secondary | ICD-10-CM | POA: Diagnosis not present

## 2020-01-13 DIAGNOSIS — Z1331 Encounter for screening for depression: Secondary | ICD-10-CM | POA: Diagnosis not present

## 2020-01-13 DIAGNOSIS — K219 Gastro-esophageal reflux disease without esophagitis: Secondary | ICD-10-CM | POA: Diagnosis not present

## 2020-01-13 DIAGNOSIS — G43909 Migraine, unspecified, not intractable, without status migrainosus: Secondary | ICD-10-CM | POA: Diagnosis not present

## 2020-01-13 DIAGNOSIS — M47819 Spondylosis without myelopathy or radiculopathy, site unspecified: Secondary | ICD-10-CM | POA: Diagnosis not present

## 2020-01-15 ENCOUNTER — Telehealth: Payer: Self-pay | Admitting: *Deleted

## 2020-01-15 NOTE — Telephone Encounter (Signed)
Labs received from [place] on 01/06/20 Reviewed by Dr. Estanislado Pandy   CMP,CBC Lipid Panel  MCV 99.0 RDW 11.5 LDL/ HDL Ratio 1.6 All other labs WNL  Patient on Gabapentin and Zanaflex.

## 2020-01-16 ENCOUNTER — Encounter: Payer: Self-pay | Admitting: Rheumatology

## 2020-01-16 ENCOUNTER — Ambulatory Visit (INDEPENDENT_AMBULATORY_CARE_PROVIDER_SITE_OTHER): Payer: BC Managed Care – PPO | Admitting: Physician Assistant

## 2020-01-16 ENCOUNTER — Other Ambulatory Visit: Payer: Self-pay

## 2020-01-16 VITALS — BP 93/52 | HR 70 | Resp 14 | Ht 65.5 in | Wt 142.2 lb

## 2020-01-16 DIAGNOSIS — M797 Fibromyalgia: Secondary | ICD-10-CM | POA: Diagnosis not present

## 2020-01-16 DIAGNOSIS — Z8639 Personal history of other endocrine, nutritional and metabolic disease: Secondary | ICD-10-CM

## 2020-01-16 DIAGNOSIS — F5101 Primary insomnia: Secondary | ICD-10-CM | POA: Diagnosis not present

## 2020-01-16 DIAGNOSIS — R5382 Chronic fatigue, unspecified: Secondary | ICD-10-CM | POA: Diagnosis not present

## 2020-01-16 DIAGNOSIS — Z8709 Personal history of other diseases of the respiratory system: Secondary | ICD-10-CM

## 2020-01-16 DIAGNOSIS — Z8719 Personal history of other diseases of the digestive system: Secondary | ICD-10-CM

## 2020-01-16 DIAGNOSIS — L718 Other rosacea: Secondary | ICD-10-CM

## 2020-01-16 DIAGNOSIS — M7062 Trochanteric bursitis, left hip: Secondary | ICD-10-CM

## 2020-01-16 DIAGNOSIS — I7300 Raynaud's syndrome without gangrene: Secondary | ICD-10-CM

## 2020-01-16 DIAGNOSIS — M5136 Other intervertebral disc degeneration, lumbar region: Secondary | ICD-10-CM | POA: Diagnosis not present

## 2020-01-16 DIAGNOSIS — N3010 Interstitial cystitis (chronic) without hematuria: Secondary | ICD-10-CM

## 2020-01-16 DIAGNOSIS — Z8669 Personal history of other diseases of the nervous system and sense organs: Secondary | ICD-10-CM

## 2020-01-16 DIAGNOSIS — Z8679 Personal history of other diseases of the circulatory system: Secondary | ICD-10-CM

## 2020-01-16 MED ORDER — LIDOCAINE HCL 1 % IJ SOLN
1.5000 mL | INTRAMUSCULAR | Status: AC | PRN
  Administered 2020-01-16: 1.5 mL

## 2020-01-16 MED ORDER — TRIAMCINOLONE ACETONIDE 40 MG/ML IJ SUSP
40.0000 mg | INTRAMUSCULAR | Status: AC | PRN
  Administered 2020-01-16: 40 mg via INTRA_ARTICULAR

## 2020-01-20 ENCOUNTER — Other Ambulatory Visit: Payer: Self-pay | Admitting: Rheumatology

## 2020-01-20 NOTE — Telephone Encounter (Signed)
Last Visit: 01/16/20 Next Visit: 07/16/20  Per office note on 01/16/20 dose is: gabapentin 300 mg 1 capsule by mouth at bedtime   Okay to refill per Dr. Deveshwar  

## 2020-01-21 DIAGNOSIS — M6281 Muscle weakness (generalized): Secondary | ICD-10-CM | POA: Diagnosis not present

## 2020-01-21 DIAGNOSIS — M6289 Other specified disorders of muscle: Secondary | ICD-10-CM | POA: Diagnosis not present

## 2020-01-21 DIAGNOSIS — N3010 Interstitial cystitis (chronic) without hematuria: Secondary | ICD-10-CM | POA: Diagnosis not present

## 2020-01-21 DIAGNOSIS — M62838 Other muscle spasm: Secondary | ICD-10-CM | POA: Diagnosis not present

## 2020-01-27 DIAGNOSIS — N941 Unspecified dyspareunia: Secondary | ICD-10-CM | POA: Diagnosis not present

## 2020-01-27 DIAGNOSIS — L298 Other pruritus: Secondary | ICD-10-CM | POA: Diagnosis not present

## 2020-01-27 DIAGNOSIS — R3982 Chronic bladder pain: Secondary | ICD-10-CM | POA: Diagnosis not present

## 2020-01-27 DIAGNOSIS — Z87440 Personal history of urinary (tract) infections: Secondary | ICD-10-CM | POA: Diagnosis not present

## 2020-02-04 DIAGNOSIS — N3010 Interstitial cystitis (chronic) without hematuria: Secondary | ICD-10-CM | POA: Diagnosis not present

## 2020-02-04 DIAGNOSIS — M62838 Other muscle spasm: Secondary | ICD-10-CM | POA: Diagnosis not present

## 2020-02-04 DIAGNOSIS — R300 Dysuria: Secondary | ICD-10-CM | POA: Diagnosis not present

## 2020-02-04 DIAGNOSIS — M6281 Muscle weakness (generalized): Secondary | ICD-10-CM | POA: Diagnosis not present

## 2020-02-18 DIAGNOSIS — N3020 Other chronic cystitis without hematuria: Secondary | ICD-10-CM | POA: Diagnosis not present

## 2020-02-18 DIAGNOSIS — R300 Dysuria: Secondary | ICD-10-CM | POA: Diagnosis not present

## 2020-02-18 DIAGNOSIS — N3010 Interstitial cystitis (chronic) without hematuria: Secondary | ICD-10-CM | POA: Diagnosis not present

## 2020-02-18 DIAGNOSIS — N952 Postmenopausal atrophic vaginitis: Secondary | ICD-10-CM | POA: Diagnosis not present

## 2020-03-19 DIAGNOSIS — R3982 Chronic bladder pain: Secondary | ICD-10-CM | POA: Diagnosis not present

## 2020-04-07 NOTE — Progress Notes (Signed)
Opened in error

## 2020-04-08 ENCOUNTER — Other Ambulatory Visit: Payer: Self-pay

## 2020-04-08 NOTE — Progress Notes (Signed)
64 y.o. G72P1001 Married Caucasian female here for annual exam.    Burning with urinating this am.  Patient took an abx for 30 days for her interstitial cystitis, and she felt great and was pain free.  She came off and had a flare, so she went back on Macrodantin 50 mg for 90 days and is feeling well.  This morning she did have a burning pain in the urethra.   She has a prescription for the Vagifem and Vaginal estradiol cream from Dr. Amalia Hailey office, and she would like do take Vagifem through this office.   Doing well on Zoloft 25 mg daily through PCP.   Received her Covid vaccine.   Urine Dip: Neg  Labs with PCP.  Ekron:[id number] T[place], MD  Patient's last menstrual period was 11/07/1980.           Sexually active: Yes.    The current method of family planning is status post hysterectomy.    Exercising: No.   Smoker:  no  Health Maintenance: Pap:  03-06-15 Neg:Neg HR HPV History of abnormal Pap:  no MMG: 09-10-19 3D/Neg/density C/BiRads1 Colonoscopy:  1017, polyp.  Due in 2022? BMD: 2015  Result Normal TDaP: with PCP Gardasil:   no HIV: Neg with Pregnancy? Hep C:Unsure Screening Labs:  PCP.  Shingrix completed.   reports that she has never smoked. She has never used smokeless tobacco. She reports current alcohol use. She reports that she does not use drugs.  Past Medical History:  Diagnosis Date  . Asthma   . Atrophic vaginitis   . Blood transfusion without reported diagnosis 1975   with vaginal delivery--very anemic  . Cystic teratoma   . Elevated cholesterol   . Endometriosis   . Esophageal ulcer   . Fibromyalgia   . Fundic gland polyps of stomach, benign   . GERD (gastroesophageal reflux disease)   . History of diastolic dysfunction   . History of migraine headaches   . Leg edema    left leg  . Ocular rosacea   . Osteoarthritis    back,hands, knees  . Overactive bladder   . Raynaud's disease   . Status post cystoscopy 09/06/2019   [facility]  .  Tachycardia   . Tubular adenoma of colon 08/2016    Past Surgical History:  Procedure Laterality Date  . APPENDECTOMY    . BACK SURGERY    . BREAST EXCISIONAL BIOPSY Left   . BREAST SURGERY  10-09-13   breast reduction--Dr. Dessie Coma  . BUNIONECTOMY     bilateral  . CARDIOVASCULAR STRESS TEST  07/02/2008   EF 55-60%  . colonscopy    . DENTAL SURGERY  2007  . ESOPHAGOGASTRODUODENOSCOPY ENDOSCOPY    . FOOT SURGERY     LEFT FOOT  . left breast mass removed    . OOPHORECTOMY  1982   RSO partial and LSO, uterus  . REDUCTION MAMMAPLASTY Bilateral   . RSO  2002   partial  . ruptured disc surgery  07,08  . TOTAL ABDOMINAL HYSTERECTOMY      Current Outpatient Medications  Medication Sig Dispense Refill  . Aloe Vera 25 MG CAPS Take by mouth.    . cycloSPORINE (RESTASIS) 0.05 % ophthalmic emulsion Place 1 drop into both eyes 2 (two) times daily.    . diazepam (VALIUM) 5 MG tablet Take 5 mg by mouth every 6 (six) hours as needed for anxiety.    . dicyclomine (BENTYL) 10 MG capsule Take 1 capsule (10 mg total)  by mouth as needed for spasms. 90 capsule 11  . diltiazem (CARDIZEM CD) 120 MG 24 hr capsule TAKE 1 CAPSULE BY MOUTH EVERY DAY 90 capsule 3  . diltiazem (CARDIZEM) 30 MG tablet Take 1 tablet (30 mg total) by mouth 4 (four) times daily as needed. 60 tablet 11  . DULERA 200-5 MCG/ACT AERO Inhale 2 puffs into the lungs 2 (two) times daily.  5  . Estradiol (VAGIFEM) 10 MCG TABS vaginal tablet Place 1 tablet (10 mcg total) vaginally 2 (two) times a week. 24 tablet 3  . Estradiol 10 % CREA Apply 1 application topically as directed.     . fexofenadine (ALLEGRA) 180 MG tablet Take 180 mg by mouth every morning.    . gabapentin (NEURONTIN) 300 MG capsule TAKE 1 CAPSULE BY MOUTH EVERYDAY AT BEDTIME 90 capsule 0  . hydrOXYzine (ATARAX/VISTARIL) 25 MG tablet Take 25 mg by mouth daily.    Marland Kitchen ketorolac (TORADOL) 10 MG tablet Take 10 mg by mouth every 6 (six) hours as needed.    . Meth-Hyo-M  Bl-Na Phos-Ph Sal (URO-MP) 118 MG CAPS Take 1 capsule by mouth 4 (four) times daily as needed (bladder).     . montelukast (SINGULAIR) 10 MG tablet Take 10 mg by mouth at bedtime.      . nitrofurantoin (MACRODANTIN) 50 MG capsule Take 50 mg by mouth daily.    . Omega-3 Fatty Acids (OMEGA 3 PO) Take 1 capsule by mouth daily.      Marland Kitchen PROAIR HFA 108 (90 BASE) MCG/ACT inhaler Inhale 2 puffs into the lungs every 6 (six) hours as needed for wheezing or shortness of breath.   11  . rosuvastatin (CRESTOR) 5 MG tablet TAKE 1 TABLET (5 MG TOTAL) BY MOUTH DAILY AT 6 PM. 90 tablet 1  . sertraline (ZOLOFT) 25 MG tablet Take 1 tablet (25 mg total) by mouth daily. 90 tablet 1  . tiZANidine (ZANAFLEX) 4 MG tablet Take 1 tablet (4 mg total) by mouth at bedtime. 30 tablet 0   Current Facility-Administered Medications  Medication Dose Route Frequency Provider Last Rate Last Admin  . 0.9 %  sodium chloride infusion  500 mL Intravenous Continuous Ladene Artist, MD        Family History  Problem Relation Age of Onset  . Coronary artery disease Father   . Hypertension Father   . Heart disease Father   . Heart attack Father   . Asthma Mother   . Hypertension Mother   . Alzheimer's disease Maternal Grandmother   . Lung cancer Maternal Grandfather   . Heart attack Maternal Grandfather   . Heart failure Paternal Grandfather   . Heart attack Paternal Grandfather   . Skin cancer Sister        melanoma  . Hepatitis C Brother   . Skin cancer Brother        melanoma  . Pancreatic cancer Maternal Aunt   . Colon cancer Neg Hx   . Colon polyps Neg Hx   . Rectal cancer Neg Hx   . Stomach cancer Neg Hx   . Breast cancer Neg Hx     Review of Systems  All other systems reviewed and are negative.   Exam:   BP 120/74   Pulse 84   Temp (!) 97.2 F (36.2 C) (Temporal)   Resp 16   Ht 5' 5.5" (1.664 m)   Wt 139 lb 12.8 oz (63.4 kg)   LMP 11/07/1980   BMI 22.91 kg/m  General appearance: alert,  cooperative and appears stated age Head: normocephalic, without obvious abnormality, atraumatic Neck: no adenopathy, supple, symmetrical, trachea midline and thyroid normal to inspection and palpation Lungs: clear to auscultation bilaterally Breasts: normal appearance, no masses or tenderness, No nipple retraction or dimpling, No nipple discharge or bleeding, No axillary adenopathy Heart: regular rate and rhythm Abdomen: soft, non-tender; no masses, no organomegaly Extremities: extremities normal, atraumatic, no cyanosis or edema Skin: skin color, texture, turgor normal. No rashes or lesions Lymph nodes: cervical, supraclavicular, and axillary nodes normal. Neurologic: grossly normal  Pelvic: External genitalia:  no lesions              No abnormal inguinal nodes palpated.              Urethra:  normal appearing urethra with no masses, tenderness or lesions              Bartholins and Skenes: normal                 Vagina: normal appearing vagina with normal color and discharge, no lesions              Cervix: absent              Pap taken: No. Bimanual Exam:  Uterus: absent              Adnexa: no mass, fullness, tenderness              Rectal exam: Yes.  .  Confirms.              Anus:  normal sphincter tone, no lesions  Chaperone was present for exam.  Assessment:   Well woman visit with normal exam. Status post TAH.  Status post RSO and status post LSO. Menopausal symptoms. On Zoloft and Neurontin.  IBS. Bladder pain.  Interstitial cystitis.  Doing well on 90 day course of abx.  Plan: Mammogram screening discussed. Self breast awareness reviewed. Pap and HR HPV as above. Guidelines for Calcium, Vitamin D, regular exercise program including cardiovascular and weight bearing exercise. Refill of Vagifem for one year.  I did discuss the potential effect on breast cancer.  She will check on her last BMD result with her PCP.  Follow up annually and prn.   After visit summary  provided.

## 2020-04-09 ENCOUNTER — Encounter: Payer: Self-pay | Admitting: Obstetrics and Gynecology

## 2020-04-09 ENCOUNTER — Ambulatory Visit (INDEPENDENT_AMBULATORY_CARE_PROVIDER_SITE_OTHER): Payer: BC Managed Care – PPO | Admitting: Obstetrics and Gynecology

## 2020-04-09 VITALS — BP 120/74 | HR 84 | Temp 97.2°F | Resp 16 | Ht 65.5 in | Wt 139.8 lb

## 2020-04-09 DIAGNOSIS — Z01419 Encounter for gynecological examination (general) (routine) without abnormal findings: Secondary | ICD-10-CM | POA: Diagnosis not present

## 2020-04-09 DIAGNOSIS — R300 Dysuria: Secondary | ICD-10-CM

## 2020-04-09 LAB — POCT URINALYSIS DIPSTICK
Bilirubin, UA: NEGATIVE
Blood, UA: NEGATIVE
Glucose, UA: NEGATIVE
Ketones, UA: NEGATIVE
Leukocytes, UA: NEGATIVE
Nitrite, UA: NEGATIVE
Protein, UA: NEGATIVE
Urobilinogen, UA: 0.2 E.U./dL
pH, UA: 5 (ref 5.0–8.0)

## 2020-04-09 MED ORDER — ESTRADIOL 10 MCG VA TABS: 10.0000 ug | ORAL_TABLET | VAGINAL | 3 refills | Status: DC

## 2020-04-09 NOTE — Patient Instructions (Signed)

## 2020-05-03 ENCOUNTER — Other Ambulatory Visit: Payer: Self-pay | Admitting: Obstetrics and Gynecology

## 2020-05-04 NOTE — Telephone Encounter (Signed)
Spoke with patient and she states her PCP is prescribing Zoloft now. She will let pharmacy know to send to PCP for refill.

## 2020-05-04 NOTE — Telephone Encounter (Signed)
Medication refill request: Zoloft 25mg  #90, 1R Last AEX:  04-09-20 Next AEX: 04-12-21 Last MMG (if hormonal medication request): n/a Refill authorized: Please refill if appropriate

## 2020-05-04 NOTE — Telephone Encounter (Signed)
Please check with patient about her current Zoloft prescription.  I think her PCP is now prescribing this for her.

## 2020-05-05 ENCOUNTER — Other Ambulatory Visit: Payer: Self-pay | Admitting: Cardiovascular Disease

## 2020-05-06 ENCOUNTER — Other Ambulatory Visit: Payer: Self-pay | Admitting: Rheumatology

## 2020-05-06 NOTE — Telephone Encounter (Signed)
Last Visit: 01/16/20 Next Visit: 07/16/20  Per office note on 01/16/20 dose is: gabapentin 300 mg 1 capsule by mouth at bedtime   Okay to refill per Dr. Estanislado Pandy

## 2020-06-10 DIAGNOSIS — H5213 Myopia, bilateral: Secondary | ICD-10-CM | POA: Diagnosis not present

## 2020-06-10 DIAGNOSIS — H04123 Dry eye syndrome of bilateral lacrimal glands: Secondary | ICD-10-CM | POA: Diagnosis not present

## 2020-06-10 DIAGNOSIS — H16203 Unspecified keratoconjunctivitis, bilateral: Secondary | ICD-10-CM | POA: Diagnosis not present

## 2020-06-10 DIAGNOSIS — H25813 Combined forms of age-related cataract, bilateral: Secondary | ICD-10-CM | POA: Diagnosis not present

## 2020-06-29 DIAGNOSIS — R3982 Chronic bladder pain: Secondary | ICD-10-CM | POA: Diagnosis not present

## 2020-07-03 NOTE — Progress Notes (Signed)
Office Visit Note  Patient: Lauren Montes             Date of Birth: 09-Dec-1955           MRN: [id number]             PCP: Haywood Pao, MD Referring: Haywood Pao, MD Visit Date: 07/16/2020 Occupation: @GUAROCC @  Subjective:  Pain in both hips.   History of Present Illness: Ilka Lovick is a 64 y.o. female with history of fibromyalgia, osteoarthritis, degenerative disc disease of lumbar spine.  She states that her fibromyalgia is fairly well controlled.  She continues to have pain and discomfort in her bilateral trochanteric bursa.  She is trying to do some stretching exercises.  The lower back pain has been better since she has been taking gabapentin at bedtime and also taking muscle relaxer as needed basis.  Raynauds is currently not active.  Activities of Daily Living:  Patient reports morning stiffness for 45-60 minutes.   Patient Denies nocturnal pain.  Difficulty dressing/grooming: Denies Difficulty climbing stairs: Denies Difficulty getting out of chair: Denies Difficulty using hands for taps, buttons, cutlery, and/or writing: Denies  Review of Systems  Constitutional: Negative for fatigue.  HENT: Negative for mouth sores, mouth dryness and nose dryness.   Eyes: Negative for itching and dryness.  Respiratory: Negative for shortness of breath and difficulty breathing.   Cardiovascular: Negative for chest pain and palpitations.  Gastrointestinal: Negative for blood in stool, constipation and diarrhea.  Endocrine: Negative for increased urination.  Genitourinary: Negative for difficulty urinating.  Musculoskeletal: Positive for arthralgias, joint pain and morning stiffness. Negative for joint swelling, myalgias, muscle tenderness and myalgias.  Skin: Negative for color change, rash and redness.  Allergic/Immunologic: Negative for susceptible to infections.  Neurological: Negative for dizziness, numbness, headaches, memory loss and weakness.    Hematological: Positive for bruising/bleeding tendency.  Psychiatric/Behavioral: Negative for confusion.    PMFS History:  Patient Active Problem List   Diagnosis Date Noted  . Chest tightness 02/21/2019  . SVT (supraventricular tachycardia) ([place]) 09/26/2016  . HTN (hypertension) 09/24/2015  . Palpitations 03/05/2015  . Fibromyalgia 03/05/2015  . Chronic diastolic CHF (congestive heart failure), NYHA class 2 ([place]) 06/20/2014  . Sinus tachycardia 09/14/2012  . Dysphagia 06/29/2012  . Ocular rosacea   . GERD (gastroesophageal reflux disease)   . Cough 09/09/2011  . Chest tightness, discomfort, or pressure 09/09/2011  . Hyperlipidemia 05/10/2011  . SWELLING OF LIMB 01/20/2011  . GERD 02/03/2010  . ABDOMINAL PAIN-EPIGASTRIC 02/03/2010  . ABDOMINAL PAIN-MULTIPLE SITES 02/03/2010  . CONSTIPATION 09/19/2008  . ABDOMINAL PAIN, LEFT LOWER QUADRANT 09/19/2008  . COLONIC POLYPS, HYPERPLASTIC, HX OF 09/19/2008    Past Medical History:  Diagnosis Date  . Asthma   . Atrophic vaginitis   . Blood transfusion without reported diagnosis 1975   with vaginal delivery--very anemic  . Cystic teratoma   . Elevated cholesterol   . Endometriosis   . Esophageal ulcer   . Fibromyalgia   . Fundic gland polyps of stomach, benign   . GERD (gastroesophageal reflux disease)   . History of diastolic dysfunction   . History of migraine headaches   . Leg edema    left leg  . Ocular rosacea   . Osteoarthritis    back,hands, knees  . Overactive bladder   . Raynaud's disease   . Status post cystoscopy 09/06/2019   [facility]  . Tachycardia   . Tubular adenoma of colon 08/2016  Family History  Problem Relation Age of Onset  . Coronary artery disease Father   . Hypertension Father   . Heart disease Father   . Heart attack Father   . Asthma Mother   . Hypertension Mother   . Osteopenia Mother   . Alzheimer's disease Maternal Grandmother   . Lung cancer Maternal Grandfather   . Heart  attack Maternal Grandfather   . Heart failure Paternal Grandfather   . Heart attack Paternal Grandfather   . Skin cancer Sister        melanoma  . Hepatitis C Brother   . Skin cancer Brother        melanoma  . Pancreatic cancer Maternal Aunt   . Colon cancer Neg Hx   . Colon polyps Neg Hx   . Rectal cancer Neg Hx   . Stomach cancer Neg Hx   . Breast cancer Neg Hx    Past Surgical History:  Procedure Laterality Date  . APPENDECTOMY    . BACK SURGERY    . BREAST EXCISIONAL BIOPSY Left   . BREAST SURGERY  10-09-13   breast reduction--Dr. Dessie Coma  . BUNIONECTOMY     bilateral  . CARDIOVASCULAR STRESS TEST  07/02/2008   EF 55-60%  . colonscopy    . DENTAL SURGERY  2007  . ESOPHAGOGASTRODUODENOSCOPY ENDOSCOPY    . FOOT SURGERY     LEFT FOOT  . left breast mass removed    . OOPHORECTOMY  1982   RSO partial and LSO, uterus  . REDUCTION MAMMAPLASTY Bilateral   . RSO  2002   partial  . ruptured disc surgery  07,08  . TOTAL ABDOMINAL HYSTERECTOMY     Social History   Social History Narrative  . Not on file   Immunization History  Administered Date(s) Administered  . Influenza Split 08/17/2011  . PFIZER SARS-COV-2 Vaccination 01/06/2020, 02/08/2020  . Zoster Recombinat (Shingrix) 07/08/2018, 01/03/2019     Objective: Vital Signs: BP 124/77 (BP Location: Left Arm, Patient Position: Sitting, Cuff Size: Normal)   Pulse 80   Resp 13   Ht 5' 5.5" (1.664 m)   Wt 140 lb (63.5 kg)   LMP 11/07/1980   BMI 22.94 kg/m    Physical Exam Vitals and nursing note reviewed.  Constitutional:      Appearance: She is well-developed.  HENT:     Head: Normocephalic and atraumatic.  Eyes:     Conjunctiva/sclera: Conjunctivae normal.  Cardiovascular:     Rate and Rhythm: Normal rate and regular rhythm.     Heart sounds: Normal heart sounds.  Pulmonary:     Effort: Pulmonary effort is normal.     Breath sounds: Normal breath sounds.  Abdominal:     General: Bowel sounds are  normal.     Palpations: Abdomen is soft.  Musculoskeletal:     Cervical back: Normal range of motion.  Lymphadenopathy:     Cervical: No cervical adenopathy.  Skin:    General: Skin is warm and dry.     Capillary Refill: Capillary refill takes less than 2 seconds.  Neurological:     Mental Status: She is alert and oriented to person, place, and time.  Psychiatric:        Behavior: Behavior normal.      Musculoskeletal Exam: C-spine and lumbar spine were in good range of motion without any point tenderness.  Shoulder joints, elbow joints, wrist joints with good range of motion.  She has bilateral PIP and DIP thickening.  Hip joints and knee joints with good range of motion.  She has some DIP and PIP prominence in her feet with no synovitis.  She had tenderness on palpation of bilateral trochanteric bursa.  CDAI Exam: CDAI Score: -- Patient Global: --; Provider Global: -- Swollen: --; Tender: -- Joint Exam 07/16/2020   No joint exam has been documented for this visit   There is currently no information documented on the homunculus. Go to the Rheumatology activity and complete the homunculus joint exam.  Investigation: No additional findings.  Imaging: No results found.  Recent Labs: Lab Results  Component Value Date   WBC 7.1 08/15/2016   HGB 13.9 08/15/2016   PLT 248.0 08/15/2016   NA 145 (H) 09/26/2017   K 4.2 09/26/2017   CL 104 09/26/2017   CO2 24 09/26/2017   GLUCOSE 79 09/26/2017   BUN 10 09/26/2017   CREATININE 0.67 09/26/2017   BILITOT 0.5 09/26/2017   ALKPHOS 91 09/26/2017   AST 27 09/26/2017   ALT 23 09/26/2017   PROT 7.1 09/26/2017   ALBUMIN 4.8 09/26/2017   CALCIUM 9.8 09/26/2017   GFRAA 110 09/26/2017    Speciality Comments: No specialty comments available.  Procedures:  No procedures performed Allergies: Codeine, Lipitor [atorvastatin], and Latex   Assessment / Plan:     Visit Diagnoses: Fibromyalgia-she continues to have some generalized  pain and discomfort from fibromyalgia but is more manageable currently.  Chronic fatigue-secondary to fibromyalgia and insomnia.  Primary insomnia -she states her lower back pain is much better with gabapentin 300 mg 1 capsule by mouth at bedtime and Zanaflex 4 mg by mouth at bedtime.  She is able to sleep better.  She has been trying to taper Zanaflex.  Side effects of both medications were reviewed and medications were renewed.  DDD (degenerative disc disease), lumbar - 2 surgeries in the past performed by Dr. Sherwood Gambler.  MRI of the lumbar spine was obtained on 05/18/2013 which revealed postoperative change on the left at L4-L5.  She has noticed improvement in neuropathy with gabapentin.  She will try to taper gabapentin if tolerated.  Core strengthening exercises were discussed.  Raynaud's disease without gangrene-currently not active.  Keeping core temperature warm was discussed.  Trochanteric bursitis of both hips-she has been having increased pain and discomfort in the bilateral trochanteric bursa.  IT band stretches were demonstrated and handout was given.  Ocular rosacea  History of migraine  History of gastroesophageal reflux (GERD)  History of hypercholesterolemia  History of asthma  IC (interstitial cystitis) - She has been following up with Dr. Amalia Hailey  History of CHF (congestive heart failure)  Osteoporosis screening -I reviewed her last DEXA from 2012 which was normal.  I have advised patient to discuss repeating DEXA with Dr. Osborne Casco.  Use of calcium, vitamin D and resistive exercises were discussed.  Educated about COVID-19 virus infection-she is fully immunized against COVID-19.  I have advised her to get a booster when it is available for her.  Use of mask, social distancing and hand hygiene was discussed.  He also advised that she may be candidate for monoclonal antibody infusion if she develops COVID-19 infection.  Orders: No orders of the defined types were placed in  this encounter.  Meds ordered this encounter  Medications  . tiZANidine (ZANAFLEX) 4 MG tablet    Sig: Take 1 tablet (4 mg total) by mouth at bedtime.    Dispense:  30 tablet    Refill:  0  . gabapentin (  NEURONTIN) 300 MG capsule    Sig: TAKE 1 CAPSULE BY MOUTH EVERYDAY AT BEDTIME    Dispense:  90 capsule    Refill:  0    Follow-Up Instructions: Return in about 6 months (around 01/13/2021) for Osteoarthritis, FMS.   Bo Merino, MD  Note - This record has been created using Editor, commissioning.  Chart creation errors have been sought, but may not always  have been located. Such creation errors do not reflect on  the standard of medical care.

## 2020-07-14 DIAGNOSIS — H903 Sensorineural hearing loss, bilateral: Secondary | ICD-10-CM | POA: Diagnosis not present

## 2020-07-16 ENCOUNTER — Encounter: Payer: Self-pay | Admitting: Rheumatology

## 2020-07-16 ENCOUNTER — Other Ambulatory Visit: Payer: Self-pay

## 2020-07-16 ENCOUNTER — Ambulatory Visit (INDEPENDENT_AMBULATORY_CARE_PROVIDER_SITE_OTHER): Payer: BC Managed Care – PPO | Admitting: Rheumatology

## 2020-07-16 VITALS — BP 124/77 | HR 80 | Resp 13 | Ht 65.5 in | Wt 140.0 lb

## 2020-07-16 DIAGNOSIS — R5382 Chronic fatigue, unspecified: Secondary | ICD-10-CM | POA: Diagnosis not present

## 2020-07-16 DIAGNOSIS — M7062 Trochanteric bursitis, left hip: Secondary | ICD-10-CM

## 2020-07-16 DIAGNOSIS — M7061 Trochanteric bursitis, right hip: Secondary | ICD-10-CM

## 2020-07-16 DIAGNOSIS — N3010 Interstitial cystitis (chronic) without hematuria: Secondary | ICD-10-CM

## 2020-07-16 DIAGNOSIS — Z8719 Personal history of other diseases of the digestive system: Secondary | ICD-10-CM

## 2020-07-16 DIAGNOSIS — Z8639 Personal history of other endocrine, nutritional and metabolic disease: Secondary | ICD-10-CM

## 2020-07-16 DIAGNOSIS — Z8709 Personal history of other diseases of the respiratory system: Secondary | ICD-10-CM

## 2020-07-16 DIAGNOSIS — Z13820 Encounter for screening for osteoporosis: Secondary | ICD-10-CM

## 2020-07-16 DIAGNOSIS — F5101 Primary insomnia: Secondary | ICD-10-CM | POA: Diagnosis not present

## 2020-07-16 DIAGNOSIS — M5136 Other intervertebral disc degeneration, lumbar region: Secondary | ICD-10-CM | POA: Diagnosis not present

## 2020-07-16 DIAGNOSIS — Z8679 Personal history of other diseases of the circulatory system: Secondary | ICD-10-CM

## 2020-07-16 DIAGNOSIS — Z8669 Personal history of other diseases of the nervous system and sense organs: Secondary | ICD-10-CM

## 2020-07-16 DIAGNOSIS — M797 Fibromyalgia: Secondary | ICD-10-CM

## 2020-07-16 DIAGNOSIS — I7300 Raynaud's syndrome without gangrene: Secondary | ICD-10-CM

## 2020-07-16 DIAGNOSIS — Z7189 Other specified counseling: Secondary | ICD-10-CM

## 2020-07-16 DIAGNOSIS — L718 Other rosacea: Secondary | ICD-10-CM

## 2020-07-16 MED ORDER — GABAPENTIN 300 MG PO CAPS: ORAL_CAPSULE | ORAL | 0 refills | Status: DC

## 2020-07-16 MED ORDER — TIZANIDINE HCL 4 MG PO TABS: 4.0000 mg | ORAL_TABLET | Freq: Every day | ORAL | 0 refills | Status: DC

## 2020-07-21 ENCOUNTER — Encounter: Payer: Self-pay | Admitting: Obstetrics and Gynecology

## 2020-07-22 ENCOUNTER — Telehealth: Payer: Self-pay

## 2020-07-22 NOTE — Telephone Encounter (Signed)
Pt sent following mychart message:  Cuca, Benassi "Debbie"  P [facility] Hello Dr. Quincy Simmonds,   I see Dr. Osborne Casco next week and I want to talk with him about a different anti depressant to help with hot flashes. I am currently taking Sertraline but you indicated there is one that works better. Can you tell me the name of it so I can discuss it with him? Thank you!   Neoma Laming [name]) Paulo Fruit

## 2020-07-22 NOTE — Telephone Encounter (Signed)
Effexor or Paxil can treat hot flashes.

## 2020-07-22 NOTE — Telephone Encounter (Signed)
Spoke with pt. Pt given update from Dr Quincy Simmonds. Pt agreeable and verbalized understanding. Pt states has appt next week with Dr Osborne Casco and will mention these meds and will be managed by him. Thankful for advice.  Encounter closed.

## 2020-07-22 NOTE — Telephone Encounter (Signed)
Routing to Dr Quincy Simmonds. Please advise on medication for hot flashes.

## 2020-07-29 DIAGNOSIS — J4540 Moderate persistent asthma, uncomplicated: Secondary | ICD-10-CM | POA: Diagnosis not present

## 2020-07-29 DIAGNOSIS — Z23 Encounter for immunization: Secondary | ICD-10-CM | POA: Diagnosis not present

## 2020-07-29 DIAGNOSIS — E7800 Pure hypercholesterolemia, unspecified: Secondary | ICD-10-CM | POA: Diagnosis not present

## 2020-08-03 ENCOUNTER — Other Ambulatory Visit: Payer: Self-pay | Admitting: Obstetrics and Gynecology

## 2020-08-03 DIAGNOSIS — Z1231 Encounter for screening mammogram for malignant neoplasm of breast: Secondary | ICD-10-CM

## 2020-09-11 ENCOUNTER — Ambulatory Visit: Payer: BC Managed Care – PPO

## 2020-09-24 DIAGNOSIS — Z20822 Contact with and (suspected) exposure to covid-19: Secondary | ICD-10-CM | POA: Diagnosis not present

## 2020-09-28 ENCOUNTER — Telehealth: Payer: Self-pay

## 2020-09-28 NOTE — Telephone Encounter (Signed)
Patient want to speak with nurse about a medication.

## 2020-09-28 NOTE — Telephone Encounter (Signed)
Encounter reviewed and closed.  

## 2020-09-28 NOTE — Telephone Encounter (Signed)
Spoke with patient. She states she is paying  $199 for Vagifem. She is requesting generic. Advised patient Rx was sent as generic, but will check with [facility] and call patient back.  [facility] states her Ins.[employer] will only let her have Brand Name. She can pay cash and get generic Yuvafem for $111 and use [facility] and pay less. This information given to patient. She thanked me for helping her.

## 2020-10-05 DIAGNOSIS — N941 Unspecified dyspareunia: Secondary | ICD-10-CM | POA: Diagnosis not present

## 2020-10-05 DIAGNOSIS — N3010 Interstitial cystitis (chronic) without hematuria: Secondary | ICD-10-CM | POA: Diagnosis not present

## 2020-10-06 ENCOUNTER — Other Ambulatory Visit: Payer: Self-pay | Admitting: Obstetrics and Gynecology

## 2020-10-06 DIAGNOSIS — Z1231 Encounter for screening mammogram for malignant neoplasm of breast: Secondary | ICD-10-CM

## 2020-10-22 ENCOUNTER — Other Ambulatory Visit: Payer: Self-pay | Admitting: Cardiovascular Disease

## 2020-10-22 ENCOUNTER — Other Ambulatory Visit: Payer: Self-pay

## 2020-10-22 ENCOUNTER — Ambulatory Visit
Admission: RE | Admit: 2020-10-22 | Discharge: 2020-10-22 | Disposition: A | Discharge status: Home / Self Care | Payer: BC Managed Care – PPO | Source: Ambulatory Visit

## 2020-10-22 DIAGNOSIS — Z1231 Encounter for screening mammogram for malignant neoplasm of breast: Secondary | ICD-10-CM

## 2020-10-30 IMAGING — CT CT ABD-PELV W/ CM
1 of 3 series · 13 of 32 positions shown, 19 images · IV contrast (APPLIED)
Comparison: CT scan dated 08/19/2016

CLINICAL DATA: Left lower quadrant pain.

EXAM:
CT ABDOMEN AND PELVIS WITH CONTRAST
TECHNIQUE: Multidetector CT imaging of the abdomen and pelvis was performed
using the standard protocol following bolus administration of
intravenous contrast.
CONTRAST:  100mL [id number] IOPAMIDOL ([id number]) INJECTION 61%

[Series 2: abd/pelvis w/cm · axial · 0.76mm/px · z∈[-460,-85]mm · 13 of 89 slices shown, 19 images]
[im 7/89  soft-tissue]
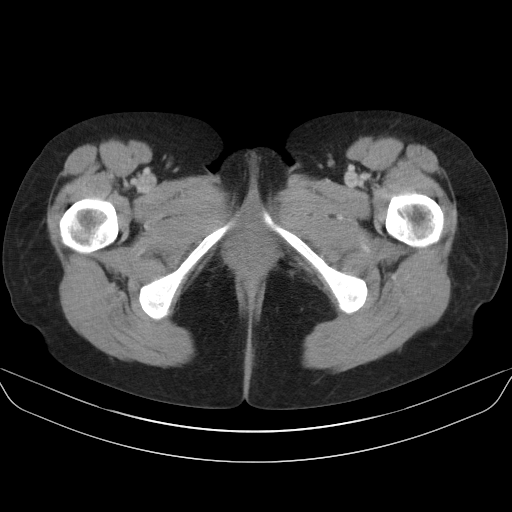
[im 7/89  bone]
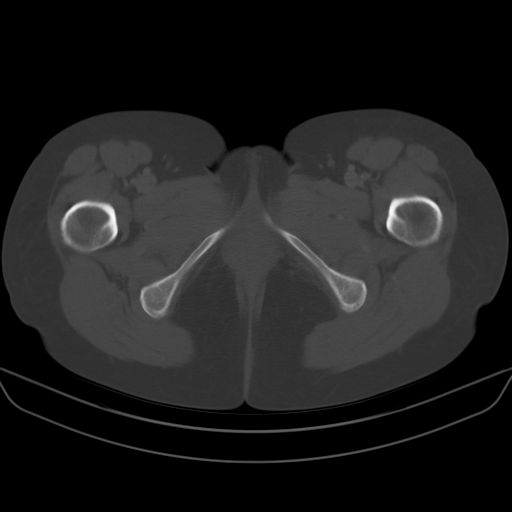
[im 13/89  soft-tissue]
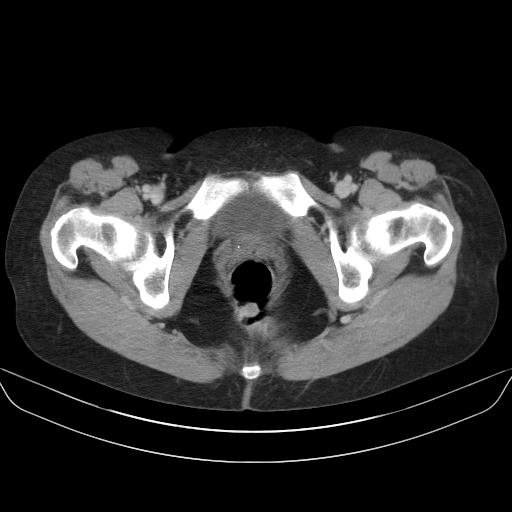
[im 19/89  soft-tissue]
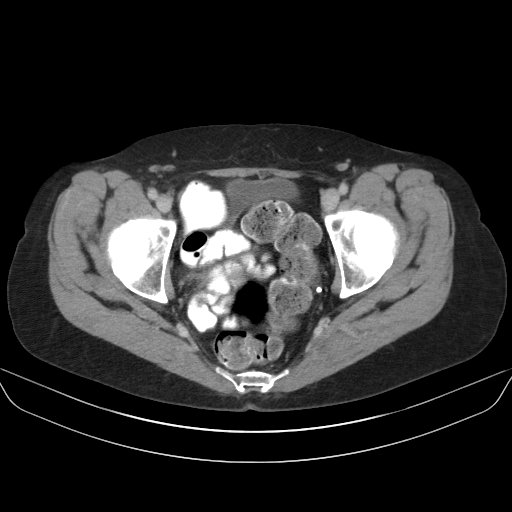
[im 26/89  soft-tissue]
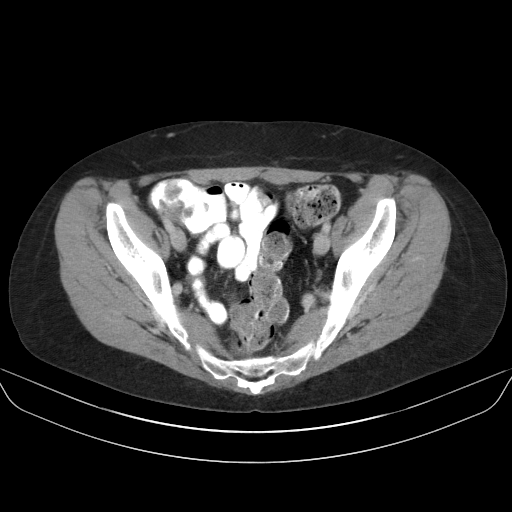
[im 32/89  soft-tissue]
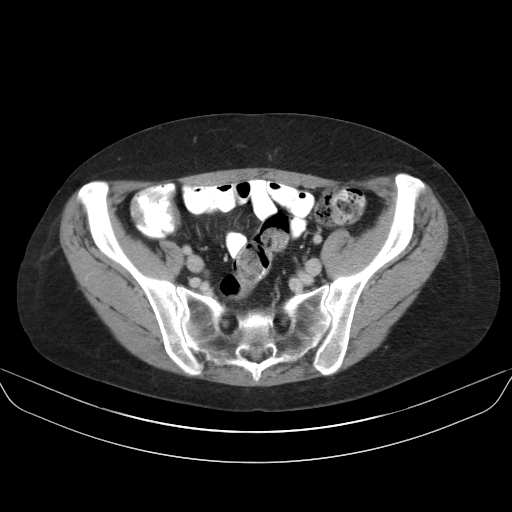
[im 38/89  soft-tissue]
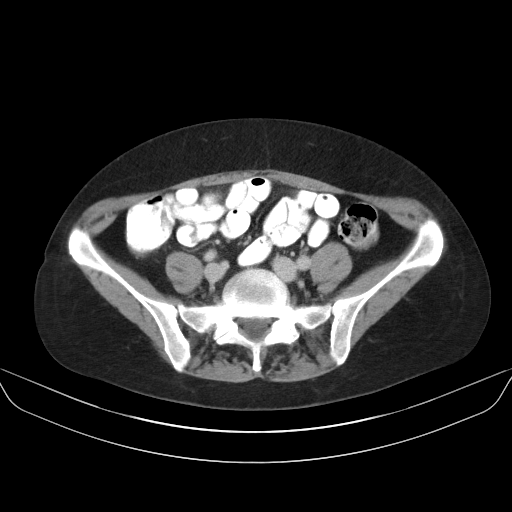
[im 45/89  soft-tissue]
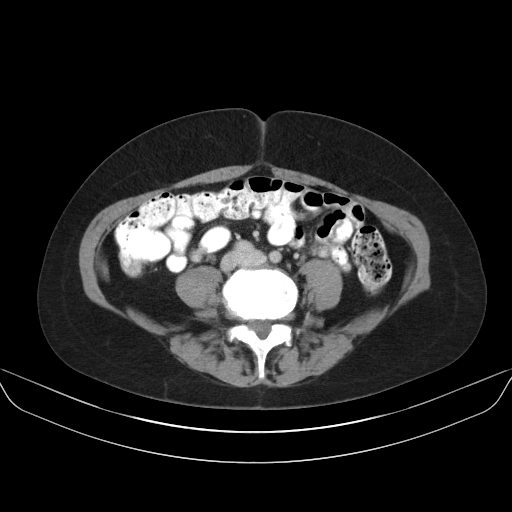
[im 51/89  soft-tissue]
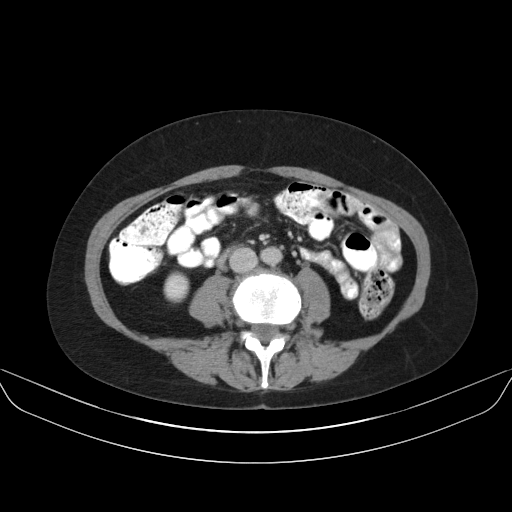
[im 57/89  soft-tissue]
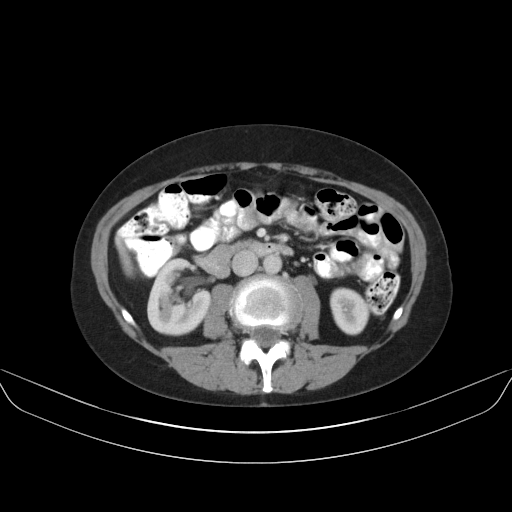
[im 57/89  bone]
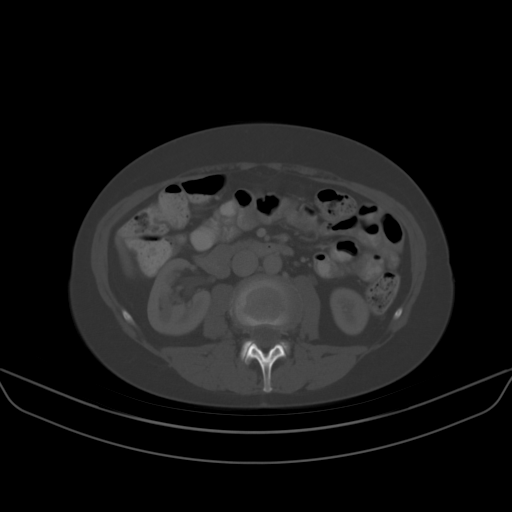
[im 63/89  soft-tissue]
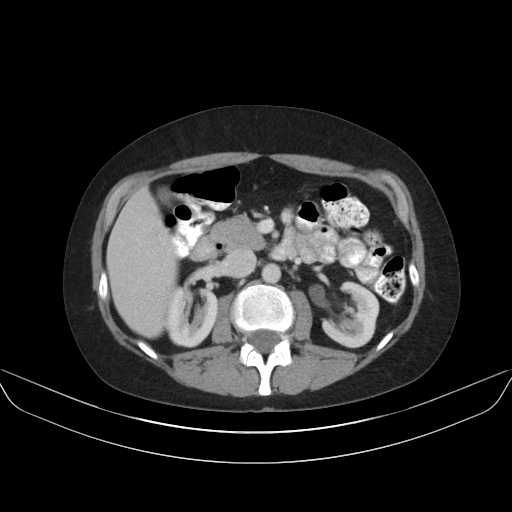
[im 63/89  lung]
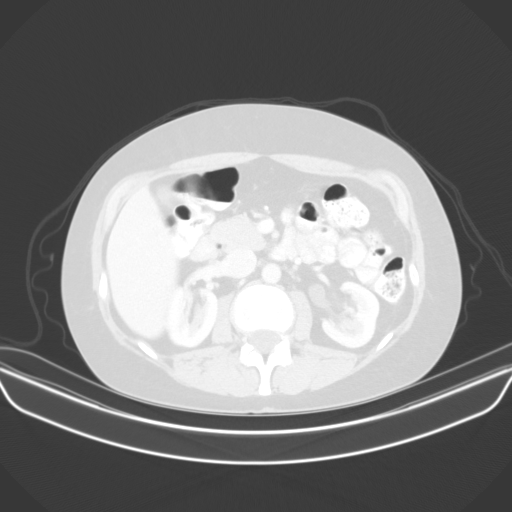
[im 70/89  soft-tissue]
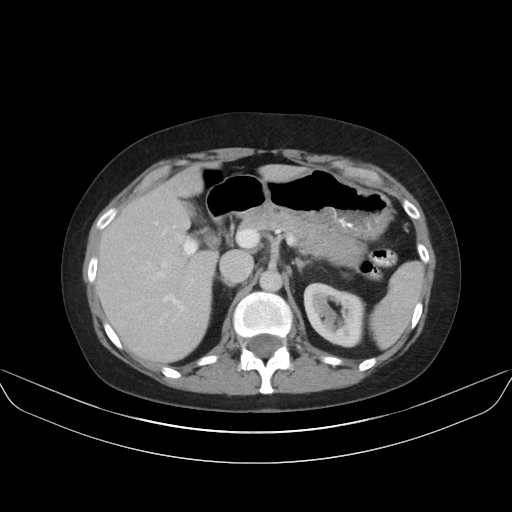
[im 70/89  lung]
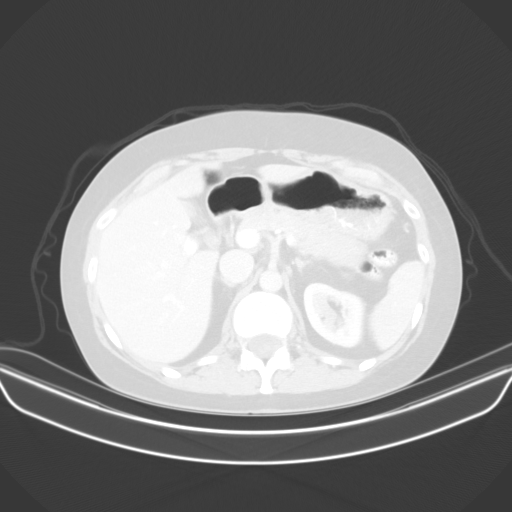
[im 76/89  soft-tissue]
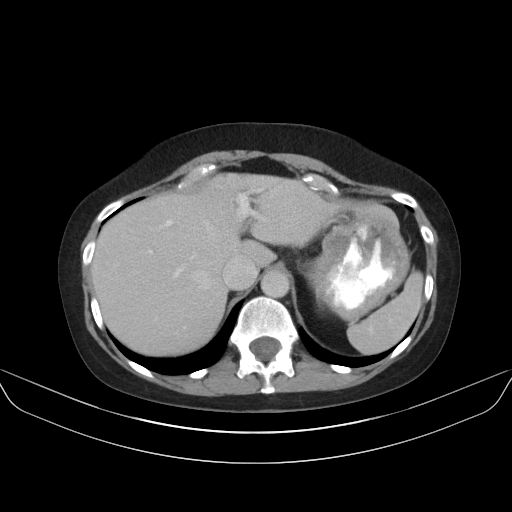
[im 76/89  lung]
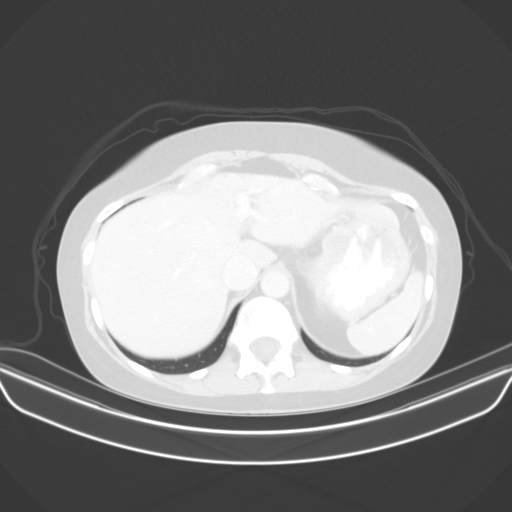
[im 82/89  soft-tissue]
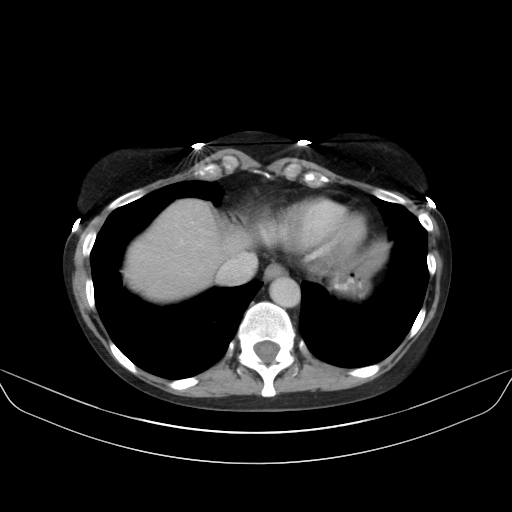
[im 82/89  lung]
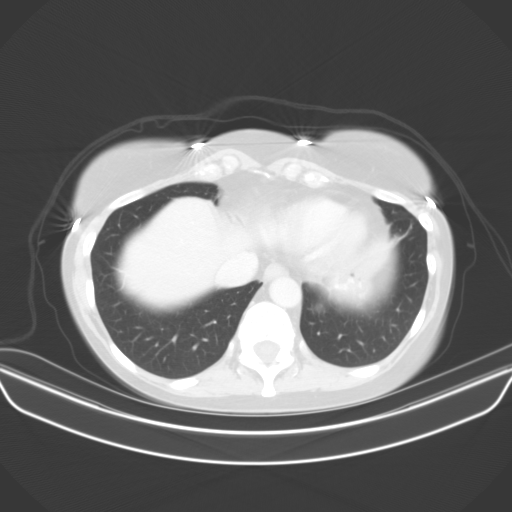

[13 of 32 positions shown; findings below may reference images not displayed]

FINDINGS: Lower chest: No acute abnormality.

Hepatobiliary: There are a few tiny cysts in the left lobe of the
liver. Focal slight fatty infiltration in the left lobe adjacent to
the falciform ligament. Small cyst in the right lobe adjacent to the
gallbladder. Biliary tree is normal. No change since the prior CT
scan.

Pancreas: Unremarkable. No pancreatic ductal dilatation or
surrounding inflammatory changes.

Spleen: Normal in size without focal abnormality.

Adrenals/Urinary Tract: 10 mm cyst in the upper pole of the
otherwise normal left kidney. Right kidney is normal. Adrenal glands
are normal. No hydronephrosis. Bladder is normal.

Stomach/Bowel: Stomach is within normal limits. Appendix has been
removed. No evidence of bowel wall thickening, distention, or
inflammatory changes.

Vascular/Lymphatic: No significant vascular findings are present. No
enlarged abdominal or pelvic lymph nodes.

Reproductive: Status post hysterectomy. No adnexal masses.

Other: No abdominal wall hernia or abnormality. No abdominopelvic
ascites.

Musculoskeletal: No acute abnormality. Fairly severe degenerative
disc disease at L4-5 without discrete disc protrusion.
IMPRESSION: Benign-appearing abdomen.

## 2020-11-08 ENCOUNTER — Encounter: Payer: Self-pay | Admitting: Cardiovascular Disease

## 2020-11-08 NOTE — Progress Notes (Signed)
  Appt was cancelled.   This encounter was created in error - please disregard.

## 2020-11-09 ENCOUNTER — Encounter: Payer: BC Managed Care – PPO | Admitting: Cardiovascular Disease

## 2020-11-13 ENCOUNTER — Ambulatory Visit (INDEPENDENT_AMBULATORY_CARE_PROVIDER_SITE_OTHER): Payer: BC Managed Care – PPO | Admitting: Cardiovascular Disease

## 2020-11-13 ENCOUNTER — Other Ambulatory Visit: Payer: Self-pay

## 2020-11-13 ENCOUNTER — Encounter: Payer: Self-pay | Admitting: Cardiovascular Disease

## 2020-11-13 VITALS — BP 112/76 | HR 72 | Ht 65.5 in | Wt 138.8 lb

## 2020-11-13 DIAGNOSIS — I471 Supraventricular tachycardia: Secondary | ICD-10-CM | POA: Diagnosis not present

## 2020-11-13 DIAGNOSIS — I10 Essential (primary) hypertension: Secondary | ICD-10-CM | POA: Diagnosis not present

## 2020-11-13 MED ORDER — ROSUVASTATIN CALCIUM 5 MG PO TABS
5.0000 mg | ORAL_TABLET | Freq: Every day | ORAL | 3 refills | Status: DC
Start: 2020-11-13 — End: 2022-03-02

## 2020-11-13 NOTE — Progress Notes (Signed)
Lauren Montes Date of Birth  June 25, 1956 [facility] / [facility] [address] .[place], [place]  [address] [phone]  Fax  [phone]  1. Hyperlipidemia 2. Sinus tachycardia, SVT 3. Chronic asthma     Lauren Montes is a middle-age female the history of mild diastolic dysfunction as well as leg edema. She also has a history of hypercholesterolemia.   Has a history of hyperlipidemia. We have had her on Crestor the past which she tolerated very well. Her insurance and he refused to pay for to try her on atorvastatin. This caused her to have lots of muscle aches as well as memory loss. We discontinued the atorvastatin at that time.  She has been recording her heart rate and blood pressure readings. Her heart rate is frequently above 100. He also has many readings in the 80-90 range.  Has bruising particularly on her hands and on her fingers. [name] never had any easy bleeding. She did not have any bleeding  complications. during her pregnancies or deliveries.  Feb. 4, 2014: Lauren Montes is doing well.  Her HR and BP have been well controlled.   June 17, 2013:  Lauren Montes is doing well.  She has started waking over the past several weeks.    June 21, 2014:  Lauren Montes is having some issues with DOE.    Perhaps slight chest pressure with exertion.  No pain. .  Sleeps on 2 pillows occasionally at night - not every night.  [name] very careful with her salt intake. She and her husband thinks most of her meals of scratch. She denies any leg swelling   Nov. 17, 2015:  Lauren Montes is doing well.  She is seen for chronic dyspnea. Her echo was normal.  myoview showed no ischemia Occasional heart racing.  Not severe.  Does not take any meds.  Resolves after several minutes. She is walking for exercise - 30-45 minutes 3 times a week.  Also do some strength training  Nov. 17, 2016: Overall doing ok Lots of stress - aging parents  Lots of muscle cramps in  legs and feet.   Feb. 27, 2017:  Lauren Montes is seen back  Cholesterol is back up - she was not able to takes atorvastatin or Pravachol because of severe muscle aches and cramping  And mental foggyness. She has  Tried  Crestor in the past and it caused muscle cramps as well . [name] now on Zetia.  May 18 , 2017:  Lauren Montes is seen today for a work in visit.  She has been having lots of swelling in her feet and hands ( feet > hands)  We gave her Lasix and Kdur to take for several days.  She took the Lasix and Kdur for 2 days .   Has not needed it sense  has had some fatigue  Has not really eaten any more salt.  This has been occuring off and of for the past 6 weeks.  Is having palpitations at night  She has had increased anxiety.  Still able to do all of her usual activities without dyspnea.   She recently stopped taking her low dose estrogen ( 2-3 weeks ago )   Nov. 20, 2017:     No issues,  No SVT Takes 1/2 zoloft a day - anxiety may have been plaing a part  Has really cut down on the cholesterol in her diet  Getting exercise. Has lost 20 lbs.  Would like to  decrease the crestor to every other day instead of daily .  Nov. 20, 2018  Doing great No significant  Tachycardia Has greatly reduced her meat intake.   Has a seafood allergy Eats some chicken.    Lots of beans .     Nov. 20, 2019  Lauren Montes is seen today  Doing very well  BP and HR are well controlled.  Has occasional episodes of tachycardia , esp at night or if she gets overexerted. Has not been exercising  Seems to occur daily  Always when she lies down at night   Some of her symptoms sound like volume depletion She drinks water all day long but then says that she has heart pounding frequently through the day.  She is having issues with her IBS .  Has not been eating much protein   November 11, 2019: Lauren Montes is seen today for follow-up of her hypertension and supraventricular tachycardia. Has been diagnosed with  interstitial cyctitis .  Is on lots of pain meds.  Still able to exercise failry well Trying to walk regularly   Jan. 3, 2022 Lauren Montes is seen today for follow up of her HTN and SVT. No cardiac issues. Has some leg cramps  - is walking 3 times a week , 45 minutes each time .   Current Outpatient Medications  Medication Sig Dispense Refill  . Aloe Vera 25 MG CAPS Take by mouth.    . Calcium Glycerophosphate (PRELIEF PO) Take by mouth as needed.    . cetirizine (ZYRTEC) 10 MG tablet Take 10 mg by mouth daily.    . cycloSPORINE (RESTASIS) 0.05 % ophthalmic emulsion Place 1 drop into both eyes 2 (two) times daily.    Marland Kitchen diltiazem (CARDIZEM CD) 120 MG 24 hr capsule TAKE 1 CAPSULE BY MOUTH EVERY DAY 90 capsule 3  . diltiazem (CARDIZEM) 30 MG tablet Take 1 tablet (30 mg total) by mouth 4 (four) times daily as needed. 60 tablet 11  . DULERA 200-5 MCG/ACT AERO Inhale 2 puffs into the lungs 2 (two) times daily.  5  . EPINEPHrine 0.3 mg/0.3 mL IJ SOAJ injection Inject 1 Dose into the muscle as needed.    . Estradiol (VAGIFEM) 10 MCG TABS vaginal tablet Place 1 tablet (10 mcg total) vaginally 2 (two) times a week. 24 tablet 3  . gabapentin (NEURONTIN) 300 MG capsule TAKE 1 CAPSULE BY MOUTH EVERYDAY AT BEDTIME 90 capsule 0  . ketorolac (TORADOL) 10 MG tablet Take 10 mg by mouth every 6 (six) hours as needed.    . Meth-Hyo-M Bl-Na Phos-Ph Sal (URO-MP) 118 MG CAPS Take 1 capsule by mouth 4 (four) times daily as needed (bladder).     . montelukast (SINGULAIR) 10 MG tablet Take 10 mg by mouth at bedtime.      . Omega-3 Fatty Acids (OMEGA 3 PO) Take 1 capsule by mouth daily.      Marland Kitchen OVER THE COUNTER MEDICATION     . OVER THE COUNTER MEDICATION 2 (two) times daily.    Marland Kitchen PROAIR HFA 108 (90 BASE) MCG/ACT inhaler Inhale 2 puffs into the lungs every 6 (six) hours as needed for wheezing or shortness of breath.   11  . rosuvastatin (CRESTOR) 5 MG tablet Take 1 tablet (5 mg total) by mouth daily at 6 PM. Please keep  upcoming appt in January 2022 with Dr. Acie Fredrickson before anymore refills. Thank you 90 tablet 0  . sertraline (ZOLOFT) 50 MG tablet Take 25 mg by mouth daily.    Marland Kitchen  tiZANidine (ZANAFLEX) 4 MG tablet Take 1 tablet (4 mg total) by mouth at bedtime. 30 tablet 0   Current Facility-Administered Medications  Medication Dose Route Frequency Provider Last Rate Last Admin  . 0.9 %  sodium chloride infusion  500 mL Intravenous Continuous Ladene Artist, MD         Allergies  Allergen Reactions  . Codeine Nausea And Vomiting    Migraine headache  . Lipitor [Atorvastatin] Other (See Comments)    Muscle ache, mind foggy,   . Latex Hives and Rash    Past Medical History:  Diagnosis Date  . Asthma   . Atrophic vaginitis   . Blood transfusion without reported diagnosis 1975   with vaginal delivery--very anemic  . Cystic teratoma   . Elevated cholesterol   . Endometriosis   . Esophageal ulcer   . Fibromyalgia   . Fundic gland polyps of stomach, benign   . GERD (gastroesophageal reflux disease)   . History of diastolic dysfunction   . History of migraine headaches   . Leg edema    left leg  . Ocular rosacea   . Osteoarthritis    back,hands, knees  . Overactive bladder   . Raynaud's disease   . Status post cystoscopy 09/06/2019   [facility]  . Tachycardia   . Tubular adenoma of colon 08/2016    Past Surgical History:  Procedure Laterality Date  . APPENDECTOMY    . BACK SURGERY    . BREAST EXCISIONAL BIOPSY Left   . BREAST SURGERY  10-09-13   breast reduction--Dr. Dessie Coma  . BUNIONECTOMY     bilateral  . CARDIOVASCULAR STRESS TEST  07/02/2008   EF 55-60%  . colonscopy    . DENTAL SURGERY  2007  . ESOPHAGOGASTRODUODENOSCOPY ENDOSCOPY    . FOOT SURGERY     LEFT FOOT  . left breast mass removed    . OOPHORECTOMY  1982   RSO partial and LSO, uterus  . REDUCTION MAMMAPLASTY Bilateral   . RSO  2002   partial  . ruptured disc surgery  07,08  . TOTAL ABDOMINAL HYSTERECTOMY       Social History   Tobacco Use  Smoking Status Never Smoker  Smokeless Tobacco Never Used    Social History   Substance and Sexual Activity  Alcohol Use Yes   Comment: social     Family History  Problem Relation Age of Onset  . Coronary artery disease Father   . Hypertension Father   . Heart disease Father   . Heart attack Father   . Asthma Mother   . Hypertension Mother   . Osteopenia Mother   . Alzheimer's disease Maternal Grandmother   . Lung cancer Maternal Grandfather   . Heart attack Maternal Grandfather   . Heart failure Paternal Grandfather   . Heart attack Paternal Grandfather   . Skin cancer Sister        melanoma  . Hepatitis C Brother   . Skin cancer Brother        melanoma  . Pancreatic cancer Maternal Aunt   . Colon cancer Neg Hx   . Colon polyps Neg Hx   . Rectal cancer Neg Hx   . Stomach cancer Neg Hx   . Breast cancer Neg Hx     Reviw of Systems:  Reviewed in the HPI.  All other systems are negative.   Physical Exam: Blood pressure 112/76, pulse 72, height 5' 5.5" (1.664 m), weight 138 lb 12.8 oz (  63 kg), last menstrual period 11/07/1980, SpO2 100 %.  GEN:  Well nourished, well developed in no acute distress HEENT: Normal NECK: No JVD; No carotid bruits LYMPHATICS: No lymphadenopathy CARDIAC: RRR , no murmurs, rubs, gallops RESPIRATORY:  Clear to auscultation without rales, wheezing or rhonchi  ABDOMEN: Soft, non-tender, non-distended MUSCULOSKELETAL:  No edema; No deformity  SKIN: Warm and dry NEUROLOGIC:  Alert and oriented x 3   ECG:    Normal sinus rhythm at 72.  She has rare premature ventricular contractions.   Assessment / Plan:     1. Essential hypertension:    Blood pressure looks good.  Continue current medications.  2. Hyperlipidemia:   Managed by Dr. Osborne Casco.  Levels have been stable.    3. Supraventricular  Tachycardia: No recent episodes of SVT.      Mertie Moores, MD  11/13/2020 4:50 PM    [facility] [place],  [place], [place]  [address] Pager [phone] Phone: [phone]; Fax: [phone]

## 2020-11-13 NOTE — Patient Instructions (Addendum)
COVID-19 prevention recommendations: 1) Vitamin D3: 2,000-4,000 iu daily 2) Vitamin C: 1000 mg daily 3) Zinc 50 mg daily 4) Vitamin K2: 100-150 mcg daily 5) Melatonin: 5 mg at bedtime    Medication Instructions:  No changes today *If you need a refill on your cardiac medications before your next appointment, please call your pharmacy*   Lab Work: none If you have labs (blood work) drawn today and your tests are completely normal, you will receive your results only by: Marland Kitchen MyChart Message (if you have MyChart) OR . A paper copy in the mail If you have any lab test that is abnormal or we need to change your treatment, we will call you to review the results.   Testing/Procedures: none   Follow-Up: At [facility], you and your health needs are our priority.  As part of our continuing mission to provide you with exceptional heart care, we have created designated Provider Care Teams.  These Care Teams include your primary Cardiologist (physician) and Advanced Practice Providers (APPs -  Physician Assistants and Nurse Practitioners) who all work together to provide you with the care you need, when you need it.  Your next appointment:   12 month(s)  The format for your next appointment:   In Person  Provider:   You may see Mertie Moores, MD or one of the following Advanced Practice Providers on your designated Care Team:    Richardson Dopp, PA-C  Robbie Lis, [place]    Other Instructions

## 2020-11-25 ENCOUNTER — Other Ambulatory Visit: Payer: Self-pay | Admitting: Cardiovascular Disease

## 2020-12-31 NOTE — Progress Notes (Signed)
Office Visit Note  Patient: Lauren Montes             Date of Birth: [birth year]           MRN: [id number]             PCP: Haywood Pao, MD Referring: Haywood Pao, MD Visit Date: 01/14/2021 Occupation: @GUAROCC @  Subjective:  Lower back pain.   History of Present Illness: Lauralynn Montes is a 65 y.o. female with a history of fibromyalgia, osteoarthritis and degenerative disc disease.  She states she continues to have lower back pain.  Her fibromyalgia symptoms are manageable.  She states she tried to taper gabapentin.  When she completely came off the medication she could not even get out of bed.  She states her lower back pain got worse.  She started taking gabapentin 300 mg at bedtime.  She has been also taking Zanaflex 4 mg p.o. nightly which helps her.  She states she has been active and doing stretching exercises.  Activities of Daily Living:  Patient reports morning stiffness for 1-2 hours.   Patient Reports nocturnal pain.  Difficulty dressing/grooming: Denies Difficulty climbing stairs: Denies Difficulty getting out of chair: Denies Difficulty using hands for taps, buttons, cutlery, and/or writing: Reports  Review of Systems  Constitutional: Negative for fatigue.  HENT: Negative for mouth sores, mouth dryness and nose dryness.   Eyes: Positive for itching, visual disturbance and dryness. Negative for pain.  Respiratory: Negative for cough, hemoptysis, shortness of breath and difficulty breathing.   Cardiovascular: Negative for chest pain, palpitations and swelling in legs/feet.  Gastrointestinal: Negative for abdominal pain, blood in stool, constipation and diarrhea.  Endocrine: Negative for increased urination.  Genitourinary: Negative for painful urination.  Musculoskeletal: Positive for arthralgias, joint pain, joint swelling and morning stiffness. Negative for myalgias, muscle weakness, muscle tenderness and myalgias.  Skin: Negative for  color change, rash and redness.  Allergic/Immunologic: Negative for susceptible to infections.  Neurological: Negative for dizziness, numbness, headaches, memory loss and weakness.  Hematological: Negative for swollen glands.  Psychiatric/Behavioral: Positive for sleep disturbance. Negative for confusion.    PMFS History:  Patient Active Problem List   Diagnosis Date Noted  . Chest tightness 02/21/2019  . SVT (supraventricular tachycardia) ([place]) 09/26/2016  . HTN (hypertension) 09/24/2015  . Palpitations 03/05/2015  . Fibromyalgia 03/05/2015  . Chronic diastolic CHF (congestive heart failure), NYHA class 2 ([place]) 06/20/2014  . Sinus tachycardia 09/14/2012  . Dysphagia 06/29/2012  . Ocular rosacea   . GERD (gastroesophageal reflux disease)   . Cough 09/09/2011  . Chest tightness, discomfort, or pressure 09/09/2011  . Hyperlipidemia 05/10/2011  . SWELLING OF LIMB 01/20/2011  . GERD 02/03/2010  . ABDOMINAL PAIN-EPIGASTRIC 02/03/2010  . ABDOMINAL PAIN-MULTIPLE SITES 02/03/2010  . CONSTIPATION 09/19/2008  . ABDOMINAL PAIN, LEFT LOWER QUADRANT 09/19/2008  . COLONIC POLYPS, HYPERPLASTIC, HX OF 09/19/2008    Past Medical History:  Diagnosis Date  . Asthma   . Atrophic vaginitis   . Blood transfusion without reported diagnosis 1975   with vaginal delivery--very anemic  . Cystic teratoma   . Elevated cholesterol   . Endometriosis   . Esophageal ulcer   . Fibromyalgia   . Fundic gland polyps of stomach, benign   . GERD (gastroesophageal reflux disease)   . History of diastolic dysfunction   . History of migraine headaches   . Leg edema    left leg  . Ocular rosacea   . Osteoarthritis  back,hands, knees  . Overactive bladder   . Raynaud's disease   . Status post cystoscopy 09/06/2019   [facility]  . Tachycardia   . Tubular adenoma of colon 08/2016    Family History  Problem Relation Age of Onset  . Coronary artery disease Father   . Hypertension Father   . Heart  disease Father   . Heart attack Father   . Asthma Mother   . Hypertension Mother   . Osteopenia Mother   . Alzheimer's disease Maternal Grandmother   . Lung cancer Maternal Grandfather   . Heart attack Maternal Grandfather   . Heart failure Paternal Grandfather   . Heart attack Paternal Grandfather   . Skin cancer Sister        melanoma  . Hepatitis C Brother   . Skin cancer Brother        melanoma  . Pancreatic cancer Maternal Aunt   . Colon cancer Neg Hx   . Colon polyps Neg Hx   . Rectal cancer Neg Hx   . Stomach cancer Neg Hx   . Breast cancer Neg Hx    Past Surgical History:  Procedure Laterality Date  . APPENDECTOMY    . BACK SURGERY    . BREAST EXCISIONAL BIOPSY Left   . BREAST SURGERY  10-09-13   breast reduction--Dr. Dessie Coma  . BUNIONECTOMY     bilateral  . CARDIOVASCULAR STRESS TEST  07/02/2008   EF 55-60%  . colonscopy    . DENTAL SURGERY  2007  . ESOPHAGOGASTRODUODENOSCOPY ENDOSCOPY    . FOOT SURGERY     LEFT FOOT  . left breast mass removed    . OOPHORECTOMY  1982   RSO partial and LSO, uterus  . REDUCTION MAMMAPLASTY Bilateral   . RSO  2002   partial  . ruptured disc surgery  07,08  . TOTAL ABDOMINAL HYSTERECTOMY     Social History   Social History Narrative  . Not on file   Immunization History  Administered Date(s) Administered  . Influenza Split 08/17/2011  . PFIZER(Purple Top)SARS-COV-2 Vaccination 01/06/2020, 02/08/2020, 08/30/2020  . Zoster Recombinat (Shingrix) 07/08/2018, 01/03/2019     Objective: Vital Signs: BP 112/62 (BP Location: Left Arm, Patient Position: Sitting, Cuff Size: Normal)   Pulse 77   Ht 5' 5.5" (1.664 m)   Wt 139 lb (63 kg)   LMP 11/07/1980   BMI 22.78 kg/m    Physical Exam Vitals and nursing note reviewed.  Constitutional:      Appearance: She is well-developed.  HENT:     Head: Normocephalic and atraumatic.  Eyes:     Conjunctiva/sclera: Conjunctivae normal.  Cardiovascular:     Rate and Rhythm:  Normal rate and regular rhythm.     Heart sounds: Normal heart sounds.  Pulmonary:     Effort: Pulmonary effort is normal.     Breath sounds: Normal breath sounds.  Abdominal:     General: Bowel sounds are normal.     Palpations: Abdomen is soft.  Musculoskeletal:     Cervical back: Normal range of motion.  Lymphadenopathy:     Cervical: No cervical adenopathy.  Skin:    General: Skin is warm and dry.     Capillary Refill: Capillary refill takes less than 2 seconds.  Neurological:     Mental Status: She is alert and oriented to person, place, and time.  Psychiatric:        Behavior: Behavior normal.      Musculoskeletal Exam: C-spine  was in good range of motion.  She has some discomfort range of motion of her lumbar spine.  She has limited range of motion of her lumbar spine.  Shoulder joints, elbow joints, wrist joints with good range of motion.  She has good range of motion of her hip joints, knee joints.  There was no tenderness over ankles or MTPs.  CDAI Exam: CDAI Score: - Patient Global: -; Provider Global: - Swollen: -; Tender: - Joint Exam 01/14/2021   No joint exam has been documented for this visit   There is currently no information documented on the homunculus. Go to the Rheumatology activity and complete the homunculus joint exam.  Investigation: No additional findings.  Imaging: No results found.  Recent Labs: Lab Results  Component Value Date   WBC 7.1 08/15/2016   HGB 13.9 08/15/2016   PLT 248.0 08/15/2016   NA 145 (H) 09/26/2017   K 4.2 09/26/2017   CL 104 09/26/2017   CO2 24 09/26/2017   GLUCOSE 79 09/26/2017   BUN 10 09/26/2017   CREATININE 0.67 09/26/2017   BILITOT 0.5 09/26/2017   ALKPHOS 91 09/26/2017   AST 27 09/26/2017   ALT 23 09/26/2017   PROT 7.1 09/26/2017   ALBUMIN 4.8 09/26/2017   CALCIUM 9.8 09/26/2017   GFRAA 110 09/26/2017   January 07, 2021 CBC normal, CMP normal, LDL 59  Speciality Comments: No specialty comments  available.  Procedures:  No procedures performed Allergies: Codeine, Lipitor [atorvastatin], and Latex   Assessment / Plan:     Visit Diagnoses: Fibromyalgia-she continues to have some pain and discomfort.  Overall the fibromyalgia symptoms are manageable currently.  Primary insomnia - gabapentin 300 mg 1 capsule by mouth at bedtime and Zanaflex 4 mg by mouth at bedtime.  She tried to taper of gabapentin but could not tolerate due to lower back pain.  We discussed reducing gabapentin to 200 mg p.o. nightly.  She would like to try with her next medication refill.  Chronic fatigue-she continues to have some fatigue.  DDD (degenerative disc disease), lumbar - 2 surgeries in the past performed by Dr. Sherwood Gambler.  MRI of the lumbar spine was obtained on 05/18/2013 which revealed postoperative change on the left at L4-L5.  Core strengthening was emphasized.  Raynaud's disease without gangrene-she has intermittent symptoms.  Trochanteric bursitis of both hips-doing better after IT band stretches.  Ocular rosacea  History of migraine  History of hypercholesterolemia  History of gastroesophageal reflux (GERD)  History of asthma  History of CHF (congestive heart failure)  IC (interstitial cystitis) - She has been following up with Dr. Amalia Hailey  Osteoporosis screening - DEXA from 2012 which was normal.  I have advised patient to discuss repeating DEXA with Dr. Osborne Casco  Orders: No orders of the defined types were placed in this encounter.  Meds ordered this encounter  Medications  . gabapentin (NEURONTIN) 100 MG capsule    Sig: Take 2 capsules (200 mg total) by mouth at bedtime.    Dispense:  60 capsule    Refill:  2     Follow-Up Instructions: Return in about 6 months (around 07/17/2021).   Bo Merino, MD  Note - This record has been created using Editor, commissioning.  Chart creation errors have been sought, but may not always  have been located. Such creation errors do not  reflect on  the standard of medical care.

## 2021-01-14 ENCOUNTER — Ambulatory Visit (INDEPENDENT_AMBULATORY_CARE_PROVIDER_SITE_OTHER): Payer: BC Managed Care – PPO | Admitting: Rheumatology

## 2021-01-14 ENCOUNTER — Other Ambulatory Visit: Payer: Self-pay

## 2021-01-14 ENCOUNTER — Encounter: Payer: Self-pay | Admitting: Rheumatology

## 2021-01-14 VITALS — BP 112/62 | HR 77 | Ht 65.5 in | Wt 139.0 lb

## 2021-01-14 DIAGNOSIS — Z13820 Encounter for screening for osteoporosis: Secondary | ICD-10-CM

## 2021-01-14 DIAGNOSIS — Z8719 Personal history of other diseases of the digestive system: Secondary | ICD-10-CM

## 2021-01-14 DIAGNOSIS — M7061 Trochanteric bursitis, right hip: Secondary | ICD-10-CM

## 2021-01-14 DIAGNOSIS — M7062 Trochanteric bursitis, left hip: Secondary | ICD-10-CM

## 2021-01-14 DIAGNOSIS — M797 Fibromyalgia: Secondary | ICD-10-CM

## 2021-01-14 DIAGNOSIS — N3010 Interstitial cystitis (chronic) without hematuria: Secondary | ICD-10-CM

## 2021-01-14 DIAGNOSIS — R5382 Chronic fatigue, unspecified: Secondary | ICD-10-CM

## 2021-01-14 DIAGNOSIS — Z8679 Personal history of other diseases of the circulatory system: Secondary | ICD-10-CM

## 2021-01-14 DIAGNOSIS — M5136 Other intervertebral disc degeneration, lumbar region: Secondary | ICD-10-CM

## 2021-01-14 DIAGNOSIS — Z8639 Personal history of other endocrine, nutritional and metabolic disease: Secondary | ICD-10-CM

## 2021-01-14 DIAGNOSIS — Z8669 Personal history of other diseases of the nervous system and sense organs: Secondary | ICD-10-CM

## 2021-01-14 DIAGNOSIS — Z8709 Personal history of other diseases of the respiratory system: Secondary | ICD-10-CM

## 2021-01-14 DIAGNOSIS — F5101 Primary insomnia: Secondary | ICD-10-CM | POA: Diagnosis not present

## 2021-01-14 DIAGNOSIS — I7300 Raynaud's syndrome without gangrene: Secondary | ICD-10-CM

## 2021-01-14 DIAGNOSIS — L718 Other rosacea: Secondary | ICD-10-CM

## 2021-01-14 MED ORDER — GABAPENTIN 100 MG PO CAPS: 200.0000 mg | ORAL_CAPSULE | Freq: Every day | ORAL | 2 refills | Status: DC

## 2021-01-27 ENCOUNTER — Other Ambulatory Visit: Payer: Self-pay | Admitting: *Deleted

## 2021-01-27 ENCOUNTER — Other Ambulatory Visit: Payer: Self-pay

## 2021-01-27 MED ORDER — GABAPENTIN 100 MG PO CAPS: 200.0000 mg | ORAL_CAPSULE | Freq: Every day | ORAL | 2 refills | Status: DC

## 2021-01-27 NOTE — Telephone Encounter (Signed)
Patient left a voicemail stating Dr. Estanislado Pandy wanted her to call when she was due for a prescription refill of her Gabapentin so she could change the dosage to 100 mg capsules.  Patient is requesting the refill be sent to [facility] at [address]

## 2021-01-27 NOTE — Telephone Encounter (Signed)
Next Visit: 07/28/2021  Last Visit: 01/14/2021  Last Fill: 07/16/2020  Dx: Primary insomnia  Current Dose per office note on 01/14/2021,  gabapentin (NEURONTIN) 100 MG capsule     Sig: Take 2 capsules (200 mg total) by mouth at bedtime.    Dispense:  60 capsule    Refill:  2     Okay to refill Gabapentin?

## 2021-03-29 NOTE — Progress Notes (Deleted)
65 y.o. G63P1001 Married Caucasian female here for annual exam.    PCP:  Domenick Gong, MD   Patient's last menstrual period was 11/07/1980.           Sexually active: Yes. The current method of family planning is Hysterectomy.    Exercising: No.  Smoker:  No.   Health Maintenance: Pap:   03-06-15 Neg:Neg HR HPV History of abnormal Pap: No. MMG: 10/22/2020 3D/Neg/density B/BiRads1 Colonoscopy: 2017, polyp.  Due in 2022? BMD:   2015 Normal  TDaP:  *** HIV: Hep C: Screening Labs:  PCP Shingrix: completed.   reports that she has never smoked. She has never used smokeless tobacco. She reports current alcohol use. She reports that she does not use drugs.  Past Medical History:  Diagnosis Date  . Asthma   . Atrophic vaginitis   . Blood transfusion without reported diagnosis 1975   with vaginal delivery--very anemic  . Cystic teratoma   . Elevated cholesterol   . Endometriosis   . Esophageal ulcer   . Fibromyalgia   . Fundic gland polyps of stomach, benign   . GERD (gastroesophageal reflux disease)   . History of diastolic dysfunction   . History of migraine headaches   . Leg edema    left leg  . Ocular rosacea   . Osteoarthritis    back,hands, knees  . Overactive bladder   . Raynaud's disease   . Status post cystoscopy 09/06/2019   [facility]  . Tachycardia   . Tubular adenoma of colon 08/2016    Past Surgical History:  Procedure Laterality Date  . APPENDECTOMY    . BACK SURGERY    . BREAST EXCISIONAL BIOPSY Left   . BREAST SURGERY  10-09-13   breast reduction--Dr. Dessie Coma  . BUNIONECTOMY     bilateral  . CARDIOVASCULAR STRESS TEST  07/02/2008   EF 55-60%  . colonscopy    . DENTAL SURGERY  2007  . ESOPHAGOGASTRODUODENOSCOPY ENDOSCOPY    . FOOT SURGERY     LEFT FOOT  . left breast mass removed    . OOPHORECTOMY  1982   RSO partial and LSO, uterus  . REDUCTION MAMMAPLASTY Bilateral   . RSO  2002   partial  . ruptured disc surgery  07,08  . TOTAL  ABDOMINAL HYSTERECTOMY      Current Outpatient Medications  Medication Sig Dispense Refill  . Aloe Vera 25 MG CAPS Take by mouth.    . Ascorbic Acid (VITAMIN C) 1000 MG tablet Take 1,000 mg by mouth daily.    . Calcium Glycerophosphate (PRELIEF PO) Take by mouth as needed.    . cetirizine (ZYRTEC) 10 MG tablet Take 10 mg by mouth daily.    . Cholecalciferol (VITAMIN D3 PO) Take 1,000 Units by mouth daily.    . cycloSPORINE (RESTASIS) 0.05 % ophthalmic emulsion Place 1 drop into both eyes 2 (two) times daily.    Marland Kitchen diltiazem (CARDIZEM CD) 120 MG 24 hr capsule TAKE 1 CAPSULE BY MOUTH EVERY DAY 90 capsule 3  . diltiazem (CARDIZEM) 30 MG tablet Take 1 tablet (30 mg total) by mouth 4 (four) times daily as needed. 60 tablet 11  . DULERA 200-5 MCG/ACT AERO Inhale 2 puffs into the lungs 2 (two) times daily.  5  . EPINEPHrine 0.3 mg/0.3 mL IJ SOAJ injection Inject 1 Dose into the muscle as needed.    . Estradiol (VAGIFEM) 10 MCG TABS vaginal tablet Place 1 tablet (10 mcg total) vaginally 2 (two) times  a week. 24 tablet 3  . gabapentin (NEURONTIN) 100 MG capsule Take 2 capsules (200 mg total) by mouth at bedtime. 60 capsule 2  . ketorolac (TORADOL) 10 MG tablet Take 10 mg by mouth every 6 (six) hours as needed.    . Meth-Hyo-M Bl-Na Phos-Ph Sal (URO-MP) 118 MG CAPS Take 1 capsule by mouth 4 (four) times daily as needed (bladder).     . montelukast (SINGULAIR) 10 MG tablet Take 10 mg by mouth at bedtime.      . Omega-3 Fatty Acids (OMEGA 3 PO) Take 1 capsule by mouth daily.      Marland Kitchen OVER THE COUNTER MEDICATION Metagenics Ultra Flora IB Probiotic    . OVER THE COUNTER MEDICATION 2 (two) times daily. Cysta Q    . PROAIR HFA 108 (90 BASE) MCG/ACT inhaler Inhale 2 puffs into the lungs every 6 (six) hours as needed for wheezing or shortness of breath.   11  . rosuvastatin (CRESTOR) 5 MG tablet Take 1 tablet (5 mg total) by mouth daily at 6 PM. 90 tablet 3  . sertraline (ZOLOFT) 50 MG tablet Take 25 mg by mouth  daily.    Marland Kitchen tiZANidine (ZANAFLEX) 4 MG tablet Take 1 tablet (4 mg total) by mouth at bedtime. 30 tablet 0  . zinc gluconate 50 MG tablet Take 50 mg by mouth daily.     Current Facility-Administered Medications  Medication Dose Route Frequency Provider Last Rate Last Admin  . 0.9 %  sodium chloride infusion  500 mL Intravenous Continuous Ladene Artist, MD        Family History  Problem Relation Age of Onset  . Coronary artery disease Father   . Hypertension Father   . Heart disease Father   . Heart attack Father   . Asthma Mother   . Hypertension Mother   . Osteopenia Mother   . Alzheimer's disease Maternal Grandmother   . Lung cancer Maternal Grandfather   . Heart attack Maternal Grandfather   . Heart failure Paternal Grandfather   . Heart attack Paternal Grandfather   . Skin cancer Sister        melanoma  . Hepatitis C Brother   . Skin cancer Brother        melanoma  . Pancreatic cancer Maternal Aunt   . Colon cancer Neg Hx   . Colon polyps Neg Hx   . Rectal cancer Neg Hx   . Stomach cancer Neg Hx   . Breast cancer Neg Hx     Review of Systems  Exam:   LMP 11/07/1980     General appearance: alert, cooperative and appears stated age Head: normocephalic, without obvious abnormality, atraumatic Neck: no adenopathy, supple, symmetrical, trachea midline and thyroid normal to inspection and palpation Lungs: clear to auscultation bilaterally Breasts: normal appearance, no masses or tenderness, No nipple retraction or dimpling, No nipple discharge or bleeding, No axillary adenopathy Heart: regular rate and rhythm Abdomen: soft, non-tender; no masses, no organomegaly Extremities: extremities normal, atraumatic, no cyanosis or edema Skin: skin color, texture, turgor normal. No rashes or lesions Lymph nodes: cervical, supraclavicular, and axillary nodes normal. Neurologic: grossly normal  Pelvic: External genitalia:  no lesions              No abnormal inguinal nodes  palpated.              Urethra:  normal appearing urethra with no masses, tenderness or lesions  Bartholins and Skenes: normal                 Vagina: normal appearing vagina with normal color and discharge, no lesions              Cervix: no lesions              Pap taken: {yes no:314532} Bimanual Exam:  Uterus:  normal size, contour, position, consistency, mobility, non-tender              Adnexa: no mass, fullness, tenderness              Rectal exam: {yes no:314532}.  Confirms.              Anus:  normal sphincter tone, no lesions  Chaperone was present for exam.  Assessment:   Well woman visit with normal exam.   Plan: Mammogram screening discussed. Self breast awareness reviewed. Pap and HR HPV as above. Guidelines for Calcium, Vitamin D, regular exercise program including cardiovascular and weight bearing exercise.   Follow up annually and prn.   Additional counseling given.  {yes [id number]. _______ minutes face to face time of which over 50% was spent in counseling.    After visit summary provided.

## 2021-04-07 DIAGNOSIS — N3010 Interstitial cystitis (chronic) without hematuria: Secondary | ICD-10-CM | POA: Diagnosis not present

## 2021-04-12 ENCOUNTER — Ambulatory Visit: Payer: BC Managed Care – PPO | Admitting: Obstetrics and Gynecology

## 2021-05-01 ENCOUNTER — Other Ambulatory Visit: Payer: Self-pay | Admitting: Rheumatology

## 2021-05-03 NOTE — Telephone Encounter (Signed)
Next Visit: 07/28/2021   Last Visit: 01/14/2021   Last Fill: 01/27/2021   Dx: Primary insomnia   Current Dose per office note on 01/14/2021,      gabapentin (NEURONTIN) 100 MG capsule        Sig: Take 2 capsules (200 mg total) by mouth at bedtime.   Okay to refill Gabapentin?

## 2021-05-31 ENCOUNTER — Encounter: Payer: Self-pay | Admitting: Gastroenterology

## 2021-06-14 ENCOUNTER — Other Ambulatory Visit: Payer: Self-pay | Admitting: Rheumatology

## 2021-06-15 DIAGNOSIS — H5213 Myopia, bilateral: Secondary | ICD-10-CM | POA: Diagnosis not present

## 2021-06-16 DIAGNOSIS — L503 Dermatographic urticaria: Secondary | ICD-10-CM | POA: Diagnosis not present

## 2021-06-16 DIAGNOSIS — J3089 Other allergic rhinitis: Secondary | ICD-10-CM | POA: Diagnosis not present

## 2021-06-16 DIAGNOSIS — J4540 Moderate persistent asthma, uncomplicated: Secondary | ICD-10-CM | POA: Diagnosis not present

## 2021-06-21 DIAGNOSIS — D485 Neoplasm of uncertain behavior of skin: Secondary | ICD-10-CM | POA: Diagnosis not present

## 2021-06-21 DIAGNOSIS — L821 Other seborrheic keratosis: Secondary | ICD-10-CM | POA: Diagnosis not present

## 2021-06-21 DIAGNOSIS — L814 Other melanin hyperpigmentation: Secondary | ICD-10-CM | POA: Diagnosis not present

## 2021-06-21 DIAGNOSIS — W5501XA Bitten by cat, initial encounter: Secondary | ICD-10-CM | POA: Diagnosis not present

## 2021-06-24 NOTE — Progress Notes (Signed)
65 y.o. G41P1001 Married Caucasian female here for Breast and pelvic exam.    Patient on antibiotics 4 weeks ago for dental infection. Took a course of Diflucan. Having vaginal irritation.  Would like urine checked today. No pain or discomfort.   Sees Dr. Rebekah Chesterfield PA for interstitial cystitis.  Not currently taking any medication.   Would like Yuvafem refill.   PCP:  Domenick Gong, MD   Patient's last menstrual period was 11/07/1980.           Sexually active: Yes.    The current method of family planning is status post hysterectomy.    Exercising: No.   Not currently Smoker:  no  Health Maintenance: Pap:  03-06-15 Neg:Neg HR HPV History of abnormal Pap:  no MMG: 10-22-20 3D/Neg/BiRads1 Colonoscopy: 2017 polyp;next 2022--scheduled 08/2021 BMD: 2015  Result :Normal TDaP:  PCP Gardasil:   no HIV:Neg in Preg Hep C:Unsure Screening Labs:  PCP.   reports that she has never smoked. She has never used smokeless tobacco. She reports current alcohol use of about 3.0 standard drinks per week. She reports that she does not use drugs.  Past Medical History:  Diagnosis Date   Asthma    Atrophic vaginitis    Blood transfusion without reported diagnosis 1975   with vaginal delivery--very anemic   Cystic teratoma    Elevated cholesterol    Endometriosis    Esophageal ulcer    Fibromyalgia    Fundic gland polyps of stomach, benign    GERD (gastroesophageal reflux disease)    History of diastolic dysfunction    History of migraine headaches    Leg edema    left leg   Ocular rosacea    Osteoarthritis    back,hands, knees   Overactive bladder    Raynaud's disease    Status post cystoscopy 09/06/2019   [facility]   Tachycardia    Tubular adenoma of colon 08/2016    Past Surgical History:  Procedure Laterality Date   APPENDECTOMY     BACK SURGERY     BREAST EXCISIONAL BIOPSY Left    BREAST SURGERY  10-09-13   breast reduction--Dr. Dessie Coma   BUNIONECTOMY      bilateral   CARDIOVASCULAR STRESS TEST  07/02/2008   EF 55-60%   colonscopy     DENTAL SURGERY  2007   ESOPHAGOGASTRODUODENOSCOPY ENDOSCOPY     FOOT SURGERY     LEFT FOOT   left breast mass removed     OOPHORECTOMY  1982   RSO partial and LSO, uterus   REDUCTION MAMMAPLASTY Bilateral    RSO  2002   partial   ruptured disc surgery  07,08   TOTAL ABDOMINAL HYSTERECTOMY      Current Outpatient Medications  Medication Sig Dispense Refill   Aloe Vera 25 MG CAPS Take by mouth.     cetirizine (ZYRTEC) 10 MG tablet Take 10 mg by mouth daily.     Cholecalciferol (VITAMIN D3 PO) Take 1,000 Units by mouth daily.     cycloSPORINE (RESTASIS) 0.05 % ophthalmic emulsion Place 1 drop into both eyes 2 (two) times daily.     cycloSPORINE (RESTASIS) 0.05 % ophthalmic emulsion 1  into affected eye     diltiazem (CARDIZEM CD) 120 MG 24 hr capsule TAKE 1 CAPSULE BY MOUTH EVERY DAY 90 capsule 3   DULERA 200-5 MCG/ACT AERO Inhale 2 puffs into the lungs 2 (two) times daily.  5   EPINEPHrine 0.3 mg/0.3 mL IJ SOAJ injection Inject 1  Dose into the muscle as needed.     Estradiol (VAGIFEM) 10 MCG TABS vaginal tablet Place 1 tablet (10 mcg total) vaginally 2 (two) times a week. 24 tablet 3   gabapentin (NEURONTIN) 100 MG capsule TAKE 2 CAPSULES BY MOUTH AT BEDTIME. 60 capsule 2   ketorolac (TORADOL) 10 MG tablet Take 10 mg by mouth every 6 (six) hours as needed.     Meth-Hyo-M Bl-Na Phos-Ph Sal (URO-MP) 118 MG CAPS Take 1 capsule by mouth 4 (four) times daily as needed (bladder).      montelukast (SINGULAIR) 10 MG tablet Take 10 mg by mouth at bedtime.       Omega-3 Fatty Acids (OMEGA 3 PO) Take 1 capsule by mouth daily.       OVER THE COUNTER MEDICATION Metagenics Ultra Flora IB Probiotic     OVER THE COUNTER MEDICATION 2 (two) times daily. Cysta Q     PROAIR HFA 108 (90 BASE) MCG/ACT inhaler Inhale 2 puffs into the lungs every 6 (six) hours as needed for wheezing or shortness of breath.   11   rosuvastatin  (CRESTOR) 5 MG tablet Take 1 tablet (5 mg total) by mouth daily at 6 PM. 90 tablet 3   sertraline (ZOLOFT) 50 MG tablet Take 25 mg by mouth daily.     TRELEGY ELLIPTA 200-62.5-25 MCG/INH AEPB Inhale 1 puff into the lungs daily.     Current Facility-Administered Medications  Medication Dose Route Frequency Provider Last Rate Last Admin   0.9 %  sodium chloride infusion  500 mL Intravenous Continuous Ladene Artist, MD        Family History  Problem Relation Age of Onset   Coronary artery disease Father    Hypertension Father    Heart disease Father    Heart attack Father    Asthma Mother    Hypertension Mother    Osteopenia Mother    Alzheimer's disease Maternal Grandmother    Lung cancer Maternal Grandfather    Heart attack Maternal Grandfather    Heart failure Paternal Grandfather    Heart attack Paternal Grandfather    Skin cancer Sister        melanoma   Hepatitis C Brother    Skin cancer Brother        melanoma   Pancreatic cancer Maternal Aunt    Colon cancer Neg Hx    Colon polyps Neg Hx    Rectal cancer Neg Hx    Stomach cancer Neg Hx    Breast cancer Neg Hx     Review of Systems  All other systems reviewed and are negative.  Exam:   BP 110/70   Pulse 88   Ht 5' 5.5" (1.664 m)   Wt 139 lb (63 kg)   LMP 11/07/1980   SpO2 97%   BMI 22.78 kg/m     General appearance: alert, cooperative and appears stated age Head: normocephalic, without obvious abnormality, atraumatic Neck: no adenopathy, supple, symmetrical, trachea midline and thyroid normal to inspection and palpation Lungs: clear to auscultation bilaterally Breasts: normal appearance, no masses or tenderness, No nipple retraction or dimpling, No nipple discharge or bleeding, No axillary adenopathy Heart: regular rate and rhythm Abdomen: soft, non-tender; no masses, no organomegaly Extremities: extremities normal, atraumatic, no cyanosis or edema Skin: skin color, texture, turgor normal. No rashes or  lesions Lymph nodes: cervical, supraclavicular, and axillary nodes normal. Neurologic: grossly normal  Pelvic: External genitalia:  no lesions  No abnormal inguinal nodes palpated.              Urethra:  normal appearing urethra with no masses, tenderness or lesions              Bartholins and Skenes: normal                 Vagina: normal appearing vagina with normal color and discharge, no lesions              Cervix: absent              Pap taken: no Bimanual Exam:  Uterus:  absent              Adnexa: no mass, fullness, tenderness              Rectal exam: yes..  Confirms.              Anus:  normal sphincter tone, no lesions  Chaperone was present for exam:  Estill Bamberg, CMA.  Assessment:   Screening breat exam.  Pelvic exam with abnormal finding absent.  Vaginitis.  Vaginal atrophy. Medication monitoring.  Yuvafem. Status post TAH.  Status post RSO and status post LSO. IBS. Interstitial cystitis.  Controlled.  Rectal exam. Menopause.  Plan: Mammogram screening discussed. Self breast awareness reviewed. Pap and HR HPV not indicated.  Urinalysis:  Sg 1.010, pH 5.5, negative everything. No UC sent. Wet prep:  negative. She will use Monistat at the introitus if needed. Guidelines for Calcium, Vitamin D, regular exercise program including cardiovascular and weight bearing exercise. Refill of Yuvafem for one year.  I discussed potential effect on breast cancer. Return for bone density. Follow up annually and prn.    After visit summary provided.   20 min  total time was spent for this patient encounter, including preparation, face-to-face counseling with the patient, coordination of care, and documentation of the encounter.

## 2021-06-25 ENCOUNTER — Ambulatory Visit (INDEPENDENT_AMBULATORY_CARE_PROVIDER_SITE_OTHER): Payer: Medicare Other | Admitting: Obstetrics and Gynecology

## 2021-06-25 ENCOUNTER — Other Ambulatory Visit: Payer: Self-pay

## 2021-06-25 ENCOUNTER — Encounter: Payer: Self-pay | Admitting: Obstetrics and Gynecology

## 2021-06-25 VITALS — BP 110/70 | HR 88 | Ht 65.5 in | Wt 139.0 lb

## 2021-06-25 DIAGNOSIS — Z01419 Encounter for gynecological examination (general) (routine) without abnormal findings: Secondary | ICD-10-CM | POA: Diagnosis not present

## 2021-06-25 DIAGNOSIS — Z87440 Personal history of urinary (tract) infections: Secondary | ICD-10-CM | POA: Diagnosis not present

## 2021-06-25 DIAGNOSIS — Z5181 Encounter for therapeutic drug level monitoring: Secondary | ICD-10-CM

## 2021-06-25 DIAGNOSIS — N3010 Interstitial cystitis (chronic) without hematuria: Secondary | ICD-10-CM

## 2021-06-25 DIAGNOSIS — Z780 Asymptomatic menopausal state: Secondary | ICD-10-CM

## 2021-06-25 DIAGNOSIS — N760 Acute vaginitis: Secondary | ICD-10-CM | POA: Diagnosis not present

## 2021-06-25 DIAGNOSIS — Z008 Encounter for other general examination: Secondary | ICD-10-CM

## 2021-06-25 DIAGNOSIS — Z1239 Encounter for other screening for malignant neoplasm of breast: Secondary | ICD-10-CM

## 2021-06-25 DIAGNOSIS — N952 Postmenopausal atrophic vaginitis: Secondary | ICD-10-CM

## 2021-06-25 LAB — URINALYSIS, COMPLETE W/RFL CULTURE
Bacteria, UA: NONE SEEN /HPF
Bilirubin Urine: NEGATIVE
Glucose, UA: NEGATIVE
Hgb urine dipstick: NEGATIVE
Hyaline Cast: NONE SEEN /LPF
Ketones, ur: NEGATIVE
Leukocyte Esterase: NEGATIVE
Nitrites, Initial: NEGATIVE
Protein, ur: NEGATIVE
RBC / HPF: NONE SEEN /HPF (ref 0–2)
Specific Gravity, Urine: 1.01 (ref 1.001–1.035)
WBC, UA: NONE SEEN /HPF (ref 0–5)
pH: 5.5 (ref 5.0–8.0)

## 2021-06-25 LAB — NO CULTURE INDICATED

## 2021-06-25 LAB — WET PREP FOR TRICH, YEAST, CLUE

## 2021-06-25 MED ORDER — ESTRADIOL 10 MCG VA TABS: 10.0000 ug | ORAL_TABLET | VAGINAL | 3 refills | Status: DC

## 2021-06-26 NOTE — Patient Instructions (Signed)

## 2021-07-02 ENCOUNTER — Other Ambulatory Visit: Payer: Self-pay | Admitting: *Deleted

## 2021-07-02 DIAGNOSIS — Z13820 Encounter for screening for osteoporosis: Secondary | ICD-10-CM

## 2021-07-09 DIAGNOSIS — M1990 Unspecified osteoarthritis, unspecified site: Secondary | ICD-10-CM | POA: Insufficient documentation

## 2021-07-09 DIAGNOSIS — H02831 Dermatochalasis of right upper eyelid: Secondary | ICD-10-CM | POA: Diagnosis not present

## 2021-07-09 DIAGNOSIS — H02423 Myogenic ptosis of bilateral eyelids: Secondary | ICD-10-CM | POA: Diagnosis not present

## 2021-07-09 DIAGNOSIS — H02834 Dermatochalasis of left upper eyelid: Secondary | ICD-10-CM | POA: Diagnosis not present

## 2021-07-09 DIAGNOSIS — H02413 Mechanical ptosis of bilateral eyelids: Secondary | ICD-10-CM | POA: Diagnosis not present

## 2021-07-15 NOTE — Progress Notes (Signed)
Office Visit Note  Patient: Lauren Montes             Date of Birth: April 30, 1956           MRN: [id number]             PCP: Haywood Pao, MD Referring: Haywood Pao, MD Visit Date: 07/28/2021 Occupation: '@GUAROCC'$ @  Subjective:  Raynaud's   History of Present Illness: Lauren Montes is a 65 y.o. female with history of fibromyalgia and DDD.  She denies any recent fibromyalgia flares.  She states that she continues to experience intermittent generalized aching and stiffness but denies any joint swelling.  She continues to experience intermittent lower back pain.  No symptoms of radiculopathy at this time.  She has been walking on a regular basis for exercise as well as stretching and performing yoga as well.  She states that she has not had an interstitial cystitis flare in over 1 year.  She has been able to reduce gabapentin to 100 mg 1 capsule at bedtime.  Her fatigue has been stable overall.  Patient reports that over the past 3 weeks she has had more frequent symptoms of Raynaud's a couple of times per week.  She denies any digital ulcerations.  She has not had any skin thickening or tightness.  She denies any recent rashes.    Activities of Daily Living:  Patient reports morning stiffness for 1 hour Patient Denies nocturnal pain.  Difficulty dressing/grooming: Denies Difficulty climbing stairs: Denies Difficulty getting out of chair: Denies Difficulty using hands for taps, buttons, cutlery, and/or writing: Denies  Review of Systems  Constitutional:  Positive for fatigue.  HENT:  Positive for ear pain. Negative for mouth sores, mouth dryness and nose dryness.   Eyes:  Positive for dryness (Restasis). Negative for pain and visual disturbance.  Respiratory:  Positive for cough. Negative for hemoptysis, shortness of breath and difficulty breathing.   Cardiovascular:  Negative for chest pain, palpitations, hypertension and swelling in legs/feet.   Gastrointestinal:  Negative for blood in stool, constipation and diarrhea.  Endocrine: Negative for increased urination.  Genitourinary:  Negative for painful urination.  Musculoskeletal:  Positive for joint pain, joint pain, myalgias, morning stiffness, muscle tenderness and myalgias. Negative for joint swelling and muscle weakness.  Skin:  Positive for color change. Negative for pallor, rash, hair loss, nodules/bumps, skin tightness, ulcers and sensitivity to sunlight.  Allergic/Immunologic: Negative for susceptible to infections.  Neurological:  Negative for dizziness, numbness, headaches and weakness.  Hematological:  Negative for swollen glands.  Psychiatric/Behavioral:  Negative for depressed mood and sleep disturbance. The patient is not nervous/anxious.    PMFS History:  Patient Active Problem List   Diagnosis Date Noted   Chest tightness 02/21/2019   SVT (supraventricular tachycardia) ([place]) 09/26/2016   HTN (hypertension) 09/24/2015   Palpitations 03/05/2015   Fibromyalgia 03/05/2015   Chronic diastolic CHF (congestive heart failure), NYHA class 2 ([place]) 06/20/2014   Sinus tachycardia 09/14/2012   Dysphagia 06/29/2012   Ocular rosacea    GERD (gastroesophageal reflux disease)    Cough 09/09/2011   Chest tightness, discomfort, or pressure 09/09/2011   Hyperlipidemia 05/10/2011   SWELLING OF LIMB 01/20/2011   GERD 02/03/2010   ABDOMINAL PAIN-EPIGASTRIC 02/03/2010   ABDOMINAL PAIN-MULTIPLE SITES 02/03/2010   CONSTIPATION 09/19/2008   ABDOMINAL PAIN, LEFT LOWER QUADRANT 09/19/2008   COLONIC POLYPS, HYPERPLASTIC, HX OF 09/19/2008    Past Medical History:  Diagnosis Date   Asthma    Atrophic  vaginitis    Blood transfusion without reported diagnosis 1975   with vaginal delivery--very anemic   Cystic teratoma    Elevated cholesterol    Endometriosis    Esophageal ulcer    Fibromyalgia    Fundic gland polyps of stomach, benign    GERD (gastroesophageal reflux disease)     History of diastolic dysfunction    History of migraine headaches    Leg edema    left leg   Ocular rosacea    Osteoarthritis    back,hands, knees   Overactive bladder    Raynaud's disease    Status post cystoscopy 09/06/2019   [facility]   Tachycardia    Tubular adenoma of colon 08/2016    Family History  Problem Relation Age of Onset   Coronary artery disease Father    Hypertension Father    Heart disease Father    Heart attack Father    Asthma Mother    Hypertension Mother    Osteopenia Mother    Alzheimer's disease Maternal Grandmother    Lung cancer Maternal Grandfather    Heart attack Maternal Grandfather    Heart failure Paternal Grandfather    Heart attack Paternal Grandfather    Skin cancer Sister        melanoma   Hepatitis C Brother    Skin cancer Brother        melanoma   Pancreatic cancer Maternal Aunt    Colon cancer Neg Hx    Colon polyps Neg Hx    Rectal cancer Neg Hx    Stomach cancer Neg Hx    Breast cancer Neg Hx    Past Surgical History:  Procedure Laterality Date   APPENDECTOMY     BACK SURGERY     BREAST EXCISIONAL BIOPSY Left    BREAST SURGERY  10-09-13   breast reduction--Dr. Holderness   BUNIONECTOMY     bilateral   CARDIOVASCULAR STRESS TEST  07/02/2008   EF 55-60%   colonscopy     DENTAL SURGERY  2007   ESOPHAGOGASTRODUODENOSCOPY ENDOSCOPY     FOOT SURGERY     LEFT FOOT   left breast mass removed     OOPHORECTOMY  1982   RSO partial and LSO, uterus   REDUCTION MAMMAPLASTY Bilateral    RSO  2002   partial   ruptured disc surgery  07,08   TOTAL ABDOMINAL HYSTERECTOMY     Social History   Social History Narrative   Not on file   Immunization History  Administered Date(s) Administered   Influenza Split 08/17/2011   PFIZER(Purple Top)SARS-COV-2 Vaccination 01/06/2020, 02/08/2020, 08/30/2020   Zoster Recombinat (Shingrix) 07/08/2018, 01/03/2019     Objective: Vital Signs: BP 102/60 (BP Location: Left Arm, Patient  Position: Sitting, Cuff Size: Small)   Pulse 77   Resp 12   Ht 5' 5.5" (1.664 m)   Wt 142 lb 3.2 oz (64.5 kg)   LMP 11/07/1980   BMI 23.30 kg/m    Physical Exam Vitals and nursing note reviewed.  Constitutional:      Appearance: She is well-developed.  HENT:     Head: Normocephalic and atraumatic.  Eyes:     Conjunctiva/sclera: Conjunctivae normal.  Cardiovascular:     Rate and Rhythm: Normal rate and regular rhythm.     Heart sounds: Normal heart sounds.  Pulmonary:     Effort: Pulmonary effort is normal.     Breath sounds: Normal breath sounds.  Abdominal:     General: Bowel  sounds are normal.     Palpations: Abdomen is soft.  Musculoskeletal:     Cervical back: Normal range of motion.  Lymphadenopathy:     Cervical: No cervical adenopathy.  Skin:    General: Skin is warm and dry.     Capillary Refill: Capillary refill takes less than 2 seconds.  Neurological:     Mental Status: She is alert and oriented to person, place, and time.  Psychiatric:        Behavior: Behavior normal.     Musculoskeletal Exam: C-spine, thoracic spine, lumbar spine have good range of motion with no discomfort.  Shoulder joints, elbow joints, wrist joints, MCPs, PIPs, DIPs have good range of motion with no synovitis.  PIP and DIP prominence consistent with osteoarthritis of both hands.  Complete fist formation bilaterally.  Hip joints have good range of motion with no discomfort.  No tenderness over trochanteric bursa bilaterally.  Knee joints have good range of motion with no warmth or effusion.  Ankle joints have good range of motion with no tenderness or joint swelling.  CDAI Exam: CDAI Score: -- Patient Global: --; Provider Global: -- Swollen: --; Tender: -- Joint Exam 07/28/2021   No joint exam has been documented for this visit   There is currently no information documented on the homunculus. Go to the Rheumatology activity and complete the homunculus joint exam.  Investigation: No  additional findings.  Imaging: No results found.  Recent Labs: Lab Results  Component Value Date   WBC 7.1 08/15/2016   HGB 13.9 08/15/2016   PLT 248.0 08/15/2016   NA 145 (H) 09/26/2017   K 4.2 09/26/2017   CL 104 09/26/2017   CO2 24 09/26/2017   GLUCOSE 79 09/26/2017   BUN 10 09/26/2017   CREATININE 0.67 09/26/2017   BILITOT 0.5 09/26/2017   ALKPHOS 91 09/26/2017   AST 27 09/26/2017   ALT 23 09/26/2017   PROT 7.1 09/26/2017   ALBUMIN 4.8 09/26/2017   CALCIUM 9.8 09/26/2017   GFRAA 110 09/26/2017    Speciality Comments: No specialty comments available.  Procedures:  No procedures performed Allergies: Codeine, Lipitor [atorvastatin], and Latex   Assessment / Plan:     Visit Diagnoses: Fibromyalgia: She has not had any recent fibromyalgia flares.  She experiences intermittent generalized aching and stiffness but overall her symptoms have been manageable.  She has been walking as well as stretching and practicing yoga on a regular basis for exercise.  She states that overall her energy level has been stable.  She has been able to reduce the dose of gabapentin to 100 mg 1 capsule at bedtime.  Refill gabapentin was sent to the pharmacy today.  We discussed the importance of regular exercise and good sleep hygiene.  She will follow-up in the office in 6 months.  Chronic fatigue: Chronic, stable.  Discussed the importance of regular exercise.    Primary insomnia - She has been able to reduce the dose of gabapentin to 100 mg 1 capsule at bedtime.  She has been unable to completely taper off of gabapentin.  A refill for gabapentin was sent to the pharmacy today.  She is no longer taking zanaflex or any other muscle relaxer's.  Discussed the importance of good sleep hygiene.     DDD (degenerative disc disease), lumbar - 2 surgeries in the past performed by Dr. Sherwood Gambler.  MRI of the lumbar spine was obtained on 05/18/2013 which revealed postoperative change on the left at L4-L5.  Raynaud's disease without gangrene: She has been experiencing intermittent symptoms of raynaud's 2-3 times per week for the past 3 weeks.  No digital ulcerations or signs of gangrene.  No skin thickening or tightness noted.  Good capillary refill <2 seconds.  Discussed the importance of avoiding triggers including exposure to cold temperatures, cigarette smoke, and emotional stressors.    Trochanteric bursitis of both hips: Resolved.  She has no tenderness over the trochanteric bursa of both hips on examination today.  She has been stretching and practicing yoga on a regular basis as recommended.  IC (interstitial cystitis) - She has been following up with Dr. Amalia Hailey.  According to the patient she has not had a flare of IC in over 1 year.  Osteoporosis screening - DEXA from 2012 which was normal.  She is scheduled to update her bone density on 08/03/2021.  Other medical conditions are listed as follows:   Ocular rosacea  History of migraine  History of hypercholesterolemia  History of asthma  History of gastroesophageal reflux (GERD)  History of CHF (congestive heart failure)   Orders: No orders of the defined types were placed in this encounter.  Meds ordered this encounter  Medications   gabapentin (NEURONTIN) 100 MG capsule    Sig: Take 1 capsule (100 mg total) by mouth at bedtime.    Dispense:  90 capsule    Refill:  0    Follow-Up Instructions: Return in about 6 months (around 01/25/2022) for Fibromyalgia, DDD.   Ofilia Neas, PA-C  Note - This record has been created using Dragon software.  Chart creation errors have been sought, but may not always  have been located. Such creation errors do not reflect on  the standard of medical care.

## 2021-07-20 DIAGNOSIS — H53483 Generalized contraction of visual field, bilateral: Secondary | ICD-10-CM | POA: Diagnosis not present

## 2021-07-24 DIAGNOSIS — H66001 Acute suppurative otitis media without spontaneous rupture of ear drum, right ear: Secondary | ICD-10-CM | POA: Diagnosis not present

## 2021-07-24 DIAGNOSIS — B379 Candidiasis, unspecified: Secondary | ICD-10-CM | POA: Diagnosis not present

## 2021-07-24 DIAGNOSIS — T3695XA Adverse effect of unspecified systemic antibiotic, initial encounter: Secondary | ICD-10-CM | POA: Diagnosis not present

## 2021-07-28 ENCOUNTER — Other Ambulatory Visit: Payer: Self-pay

## 2021-07-28 ENCOUNTER — Ambulatory Visit: Payer: Medicare Other | Admitting: Physician Assistant

## 2021-07-28 ENCOUNTER — Encounter: Payer: Self-pay | Admitting: Physician Assistant

## 2021-07-28 VITALS — BP 102/60 | HR 77 | Resp 12 | Ht 65.5 in | Wt 142.2 lb

## 2021-07-28 DIAGNOSIS — M5136 Other intervertebral disc degeneration, lumbar region: Secondary | ICD-10-CM

## 2021-07-28 DIAGNOSIS — Z8669 Personal history of other diseases of the nervous system and sense organs: Secondary | ICD-10-CM

## 2021-07-28 DIAGNOSIS — E7800 Pure hypercholesterolemia, unspecified: Secondary | ICD-10-CM | POA: Diagnosis not present

## 2021-07-28 DIAGNOSIS — M7062 Trochanteric bursitis, left hip: Secondary | ICD-10-CM

## 2021-07-28 DIAGNOSIS — F5101 Primary insomnia: Secondary | ICD-10-CM | POA: Diagnosis not present

## 2021-07-28 DIAGNOSIS — L718 Other rosacea: Secondary | ICD-10-CM

## 2021-07-28 DIAGNOSIS — F331 Major depressive disorder, recurrent, moderate: Secondary | ICD-10-CM | POA: Diagnosis not present

## 2021-07-28 DIAGNOSIS — M797 Fibromyalgia: Secondary | ICD-10-CM | POA: Diagnosis not present

## 2021-07-28 DIAGNOSIS — N3010 Interstitial cystitis (chronic) without hematuria: Secondary | ICD-10-CM

## 2021-07-28 DIAGNOSIS — Z8679 Personal history of other diseases of the circulatory system: Secondary | ICD-10-CM

## 2021-07-28 DIAGNOSIS — Z8639 Personal history of other endocrine, nutritional and metabolic disease: Secondary | ICD-10-CM

## 2021-07-28 DIAGNOSIS — I7300 Raynaud's syndrome without gangrene: Secondary | ICD-10-CM

## 2021-07-28 DIAGNOSIS — Z8709 Personal history of other diseases of the respiratory system: Secondary | ICD-10-CM

## 2021-07-28 DIAGNOSIS — R5382 Chronic fatigue, unspecified: Secondary | ICD-10-CM

## 2021-07-28 DIAGNOSIS — Z13820 Encounter for screening for osteoporosis: Secondary | ICD-10-CM

## 2021-07-28 DIAGNOSIS — Z8719 Personal history of other diseases of the digestive system: Secondary | ICD-10-CM

## 2021-07-28 DIAGNOSIS — G43909 Migraine, unspecified, not intractable, without status migrainosus: Secondary | ICD-10-CM | POA: Diagnosis not present

## 2021-07-28 DIAGNOSIS — M7061 Trochanteric bursitis, right hip: Secondary | ICD-10-CM

## 2021-07-28 DIAGNOSIS — J4540 Moderate persistent asthma, uncomplicated: Secondary | ICD-10-CM | POA: Diagnosis not present

## 2021-07-28 MED ORDER — GABAPENTIN 100 MG PO CAPS: 100.0000 mg | ORAL_CAPSULE | Freq: Every day | ORAL | 0 refills | Status: DC

## 2021-08-03 ENCOUNTER — Ambulatory Visit: Payer: Medicare Other | Admitting: Podiatry

## 2021-08-03 ENCOUNTER — Ambulatory Visit (INDEPENDENT_AMBULATORY_CARE_PROVIDER_SITE_OTHER): Payer: Medicare Other

## 2021-08-03 ENCOUNTER — Other Ambulatory Visit: Payer: Self-pay | Admitting: Obstetrics and Gynecology

## 2021-08-03 ENCOUNTER — Encounter: Payer: Self-pay | Admitting: Podiatry

## 2021-08-03 ENCOUNTER — Other Ambulatory Visit: Payer: Self-pay

## 2021-08-03 DIAGNOSIS — M7752 Other enthesopathy of left foot: Secondary | ICD-10-CM | POA: Diagnosis not present

## 2021-08-03 DIAGNOSIS — M2042 Other hammer toe(s) (acquired), left foot: Secondary | ICD-10-CM | POA: Diagnosis not present

## 2021-08-03 DIAGNOSIS — S90822A Blister (nonthermal), left foot, initial encounter: Secondary | ICD-10-CM | POA: Diagnosis not present

## 2021-08-03 DIAGNOSIS — Z780 Asymptomatic menopausal state: Secondary | ICD-10-CM | POA: Diagnosis not present

## 2021-08-03 DIAGNOSIS — Z13820 Encounter for screening for osteoporosis: Secondary | ICD-10-CM | POA: Diagnosis not present

## 2021-08-03 DIAGNOSIS — L089 Local infection of the skin and subcutaneous tissue, unspecified: Secondary | ICD-10-CM

## 2021-08-03 DIAGNOSIS — Z0000 Encounter for general adult medical examination without abnormal findings: Secondary | ICD-10-CM | POA: Insufficient documentation

## 2021-08-03 NOTE — Progress Notes (Signed)
Subjective:  Patient ID: Lauren Montes, female    DOB: 07/12/1956,  MRN: [id number] HPI Chief Complaint  Patient presents with   Toe Pain    2nd toe left - previous HT surgery in 2017, over the last 2 months continues to get red and swollen   New Patient (Initial Visit)    Est pt 2018    65 y.o. female presents with the above complaint.   ROS: Denies fever chills nausea vomiting muscle aches pains calf pain back pain chest pain shortness of breath.  Past Medical History:  Diagnosis Date   Asthma    Atrophic vaginitis    Blood transfusion without reported diagnosis 1975   with vaginal delivery--very anemic   Cystic teratoma    Elevated cholesterol    Endometriosis    Esophageal ulcer    Fibromyalgia    Fundic gland polyps of stomach, benign    GERD (gastroesophageal reflux disease)    History of diastolic dysfunction    History of migraine headaches    Leg edema    left leg   Ocular rosacea    Osteoarthritis    back,hands, knees   Overactive bladder    Raynaud's disease    Status post cystoscopy 09/06/2019   [facility]   Tachycardia    Tubular adenoma of colon 08/2016   Past Surgical History:  Procedure Laterality Date   APPENDECTOMY     BACK SURGERY     BREAST EXCISIONAL BIOPSY Left    BREAST SURGERY  10-09-13   breast reduction--Dr. Dessie Coma   BUNIONECTOMY     bilateral   CARDIOVASCULAR STRESS TEST  07/02/2008   EF 55-60%   colonscopy     DENTAL SURGERY  2007   ESOPHAGOGASTRODUODENOSCOPY ENDOSCOPY     FOOT SURGERY     LEFT FOOT   left breast mass removed     OOPHORECTOMY  1982   RSO partial and LSO, uterus   REDUCTION MAMMAPLASTY Bilateral    RSO  2002   partial   ruptured disc surgery  07,08   TOTAL ABDOMINAL HYSTERECTOMY      Current Outpatient Medications:    Aloe Vera 25 MG CAPS, Take by mouth., Disp: , Rfl:    cetirizine (ZYRTEC) 10 MG tablet, Take 10 mg by mouth daily., Disp: , Rfl:    Cholecalciferol (VITAMIN D3 PO), Take 1,000  Units by mouth daily., Disp: , Rfl:    cycloSPORINE (RESTASIS) 0.05 % ophthalmic emulsion, Place 1 drop into both eyes 2 (two) times daily., Disp: , Rfl:    cycloSPORINE (RESTASIS) 0.05 % ophthalmic emulsion, 1  into affected eye, Disp: , Rfl:    diltiazem (CARDIZEM CD) 120 MG 24 hr capsule, TAKE 1 CAPSULE BY MOUTH EVERY DAY, Disp: 90 capsule, Rfl: 3   DULERA 200-5 MCG/ACT AERO, Inhale 2 puffs into the lungs 2 (two) times daily., Disp: , Rfl: 5   EPINEPHrine 0.3 mg/0.3 mL IJ SOAJ injection, Inject 1 Dose into the muscle as needed., Disp: , Rfl:    Estradiol (VAGIFEM) 10 MCG TABS vaginal tablet, Place 1 tablet (10 mcg total) vaginally 2 (two) times a week., Disp: 24 tablet, Rfl: 3   gabapentin (NEURONTIN) 100 MG capsule, Take 1 capsule (100 mg total) by mouth at bedtime., Disp: 90 capsule, Rfl: 0   ketorolac (TORADOL) 10 MG tablet, Take 10 mg by mouth every 6 (six) hours as needed., Disp: , Rfl:    Meth-Hyo-M Bl-Na Phos-Ph Sal (URO-MP) 118 MG CAPS, Take 1  capsule by mouth 4 (four) times daily as needed (bladder).  (Patient not taking: Reported on 07/28/2021), Disp: , Rfl:    montelukast (SINGULAIR) 10 MG tablet, Take 10 mg by mouth at bedtime.  , Disp: , Rfl:    Omega-3 Fatty Acids (OMEGA 3 PO), Take 1 capsule by mouth daily.  , Disp: , Rfl:    OVER THE COUNTER MEDICATION, Metagenics Ultra Flora IB Probiotic, Disp: , Rfl:    OVER THE COUNTER MEDICATION, 2 (two) times daily. Cysta Q, Disp: , Rfl:    PROAIR HFA 108 (90 BASE) MCG/ACT inhaler, Inhale 2 puffs into the lungs every 6 (six) hours as needed for wheezing or shortness of breath. , Disp: , Rfl: 11   rosuvastatin (CRESTOR) 5 MG tablet, Take 1 tablet (5 mg total) by mouth daily at 6 PM., Disp: 90 tablet, Rfl: 3   sertraline (ZOLOFT) 50 MG tablet, Take 25 mg by mouth daily., Disp: , Rfl:   Current Facility-Administered Medications:    0.9 %  sodium chloride infusion, 500 mL, Intravenous, Continuous, Ladene Artist, MD  Allergies  Allergen  Reactions   Codeine Nausea And Vomiting and Other (See Comments)    Migraine headache   Lipitor [Atorvastatin] Other (See Comments)    Muscle ache, mind foggy,    Latex Hives, Rash and Other (See Comments)   Review of Systems Objective:  There were no vitals filed for this visit.  General: Well developed, nourished, in no acute distress, alert and oriented x3   Dermatological: Skin is warm, dry and supple bilateral. Nails x 10 are well maintained; remaining integument appears unremarkable at this time. There are no open sores, no preulcerative lesions, no rash or signs of infection present.  Second digit of the left foot demonstrates a fused toe mildly elevated but appears to be rubbing against the medial aspect of the third toe.  There is mild erythema around the DIPJ but on close evaluation demonstrates an open wound with a superficial blister.  Once the blister was debrided there is small superficial ulceration which I debrided and applied dressing.  Vascular: Dorsalis Pedis artery and Posterior Tibial artery pedal pulses are 2/4 bilateral with immedate capillary fill time. Pedal hair growth present. No varicosities and no lower extremity edema present bilateral.   Neruologic: Grossly intact via light touch bilateral. Vibratory intact via tuning fork bilateral. Protective threshold with Semmes Wienstein monofilament intact to all pedal sites bilateral. Patellar and Achilles deep tendon reflexes 2+ bilateral. No Babinski or clonus noted bilateral.   Musculoskeletal: No gross boney pedal deformities bilateral. No pain, crepitus, or limitation noted with foot and ankle range of motion bilateral. Muscular strength 5/5 in all groups tested bilateral.  Gait: Unassisted, Nonantalgic.    Radiographs:  Radiographs taken today demonstrate osseously mature individual with multiple procedures including a Lapidus procedure second third and fourth metatarsal osteotomies fifth metatarsal osteotomy as  well hammertoe fusions intact no significant changes in bone other than some osteoarthritic changes of the DIPJ's to the fused toes.  Assessment & Plan:   Assessment: Superficial blister and ulceration lateral DIPJ second digit left foot.  Plan: Debrided the lesion today placed a dressing encouraged her to soak Epson salts and warm water into place dressing daily we will follow-up with her in a few weeks just to make sure she is healing well.     Damein Gaunce T. [place], [place]

## 2021-08-17 ENCOUNTER — Other Ambulatory Visit: Payer: Self-pay

## 2021-08-17 ENCOUNTER — Ambulatory Visit (AMBULATORY_SURGERY_CENTER): Payer: Medicare Other

## 2021-08-17 ENCOUNTER — Encounter: Payer: Self-pay | Admitting: Gastroenterology

## 2021-08-17 VITALS — Ht 65.5 in | Wt 138.0 lb

## 2021-08-17 DIAGNOSIS — Z86010 Personal history of colonic polyps: Secondary | ICD-10-CM

## 2021-08-17 MED ORDER — PEG 3350-KCL-NA BICARB-NACL 420 G PO SOLR: 4000.0000 mL | Freq: Once | ORAL | 0 refills | Status: AC

## 2021-08-17 NOTE — Progress Notes (Signed)

## 2021-08-31 ENCOUNTER — Encounter: Payer: Self-pay | Admitting: Gastroenterology

## 2021-08-31 ENCOUNTER — Other Ambulatory Visit: Payer: Self-pay

## 2021-08-31 ENCOUNTER — Ambulatory Visit (AMBULATORY_SURGERY_CENTER): Payer: Medicare Other | Admitting: Gastroenterology

## 2021-08-31 VITALS — BP 115/69 | HR 72 | Temp 96.9°F | Resp 13 | Ht 65.5 in | Wt 138.0 lb

## 2021-08-31 DIAGNOSIS — D123 Benign neoplasm of transverse colon: Secondary | ICD-10-CM

## 2021-08-31 DIAGNOSIS — Z86010 Personal history of colonic polyps: Secondary | ICD-10-CM

## 2021-08-31 DIAGNOSIS — M797 Fibromyalgia: Secondary | ICD-10-CM | POA: Diagnosis not present

## 2021-08-31 MED ORDER — SODIUM CHLORIDE 0.9 % IV SOLN: 500.0000 mL | Freq: Once | INTRAVENOUS | Status: DC

## 2021-08-31 NOTE — Progress Notes (Signed)
History & Physical  Primary Care Physician:  Tisovec, Fransico Him, MD Primary Gastroenterologist: Lucio Edward, MD  CHIEF COMPLAINT:  Personal history of colon polyps   HPI: Lauren Montes is a 65 y.o. female with a history of adenomatous colon polyps on colonoscopy 5 years ago for colonoscopy.  She has a history of IBS and relates intermittent fecal urgency.      Past Medical History:  Diagnosis Date   Asthma    Atrophic vaginitis    Blood transfusion without reported diagnosis 1975   with vaginal delivery--very anemic   Cystic teratoma    Elevated cholesterol    Endometriosis    Esophageal ulcer    Fibromyalgia    Fundic gland polyps of stomach, benign    GERD (gastroesophageal reflux disease)    History of diastolic dysfunction    History of migraine headaches    Leg edema    left leg   Ocular rosacea    Osteoarthritis    back,hands, knees   Overactive bladder    Raynaud's disease    Status post cystoscopy 09/06/2019   [facility]   Tachycardia    Tubular adenoma of colon 08/2016    Past Surgical History:  Procedure Laterality Date   APPENDECTOMY     BACK SURGERY     BREAST EXCISIONAL BIOPSY Left    BREAST SURGERY  10-09-13   breast reduction--Dr. Dessie Coma   BUNIONECTOMY     bilateral   CARDIOVASCULAR STRESS TEST  07/02/2008   EF 55-60%   colonscopy     DENTAL SURGERY  2007   ESOPHAGOGASTRODUODENOSCOPY ENDOSCOPY     FOOT SURGERY     LEFT FOOT   left breast mass removed     OOPHORECTOMY  1982   RSO partial and LSO, uterus   REDUCTION MAMMAPLASTY Bilateral    RSO  2002   partial   ruptured disc surgery  07,08   TOTAL ABDOMINAL HYSTERECTOMY      Prior to Admission medications   Medication Sig Start Date End Date Taking? Authorizing Provider  albuterol (VENTOLIN HFA) 108 (90 Base) MCG/ACT inhaler  07/29/21  Yes [provider]  Aloe Vera 25 MG CAPS Take by mouth.   Yes [provider]  cetirizine (ZYRTEC) 10 MG tablet Take  10 mg by mouth daily.   Yes [provider]  Cholecalciferol (VITAMIN D3 PO) Take 1,000 Units by mouth daily.   Yes [provider]  cycloSPORINE (RESTASIS) 0.05 % ophthalmic emulsion Place 1 drop into both eyes 2 (two) times daily.   Yes [provider]  cycloSPORINE (RESTASIS) 0.05 % ophthalmic emulsion 1  into affected eye   Yes [provider]  diltiazem (CARDIZEM CD) 120 MG 24 hr capsule TAKE 1 CAPSULE BY MOUTH EVERY DAY 11/26/20  Yes Nahser, Wonda Cheng, MD  DULERA 200-5 MCG/ACT AERO Inhale 2 puffs into the lungs 2 (two) times daily. 05/27/17  Yes [provider]  Estradiol (VAGIFEM) 10 MCG TABS vaginal tablet Place 1 tablet (10 mcg total) vaginally 2 (two) times a week. 06/28/21  Yes Nunzio Cobbs, MD  gabapentin (NEURONTIN) 100 MG capsule Take 1 capsule (100 mg total) by mouth at bedtime. 07/28/21  Yes Ofilia Neas, PA-C  montelukast (SINGULAIR) 10 MG tablet Take 10 mg by mouth at bedtime.     Yes [provider]  Omega-3 Fatty Acids (OMEGA 3 PO) Take 1 capsule by mouth daily.     Yes [provider]  OVER THE COUNTER MEDICATION Metagenics Ultra Flora IB Probiotic   Yes [provider]  OVER THE COUNTER MEDICATION 2 (two) times daily. Cysta Q   Yes [provider]  PROAIR HFA 108 (90 BASE) MCG/ACT inhaler Inhale 2 puffs into the lungs every 6 (six) hours as needed for wheezing or shortness of breath.  08/07/15  Yes [provider]  rosuvastatin (CRESTOR) 5 MG tablet Take 1 tablet (5 mg total) by mouth daily at 6 PM. 11/13/20  Yes Nahser, Wonda Cheng, MD  sertraline (ZOLOFT) 50 MG tablet Take 25 mg by mouth daily. 10/12/20  Yes [provider]  EPINEPHrine 0.3 mg/0.3 mL IJ SOAJ injection Inject 1 Dose into the muscle as needed. Patient not taking: Reported on 08/31/2021 07/29/20   [provider]  ketorolac (TORADOL) 10 MG tablet Take 10 mg by mouth every 6 (six) hours as  needed. Patient not taking: No sig reported 03/19/20   [provider]  Meth-Hyo-M Bl-Na Phos-Ph Sal (URO-MP) 118 MG CAPS Take 1 capsule by mouth 4 (four) times daily as needed (bladder).  Patient not taking: No sig reported 09/06/19   [provider]    Current Outpatient Medications  Medication Sig Dispense Refill   albuterol (VENTOLIN HFA) 108 (90 Base) MCG/ACT inhaler      Aloe Vera 25 MG CAPS Take by mouth.     cetirizine (ZYRTEC) 10 MG tablet Take 10 mg by mouth daily.     Cholecalciferol (VITAMIN D3 PO) Take 1,000 Units by mouth daily.     cycloSPORINE (RESTASIS) 0.05 % ophthalmic emulsion Place 1 drop into both eyes 2 (two) times daily.     cycloSPORINE (RESTASIS) 0.05 % ophthalmic emulsion 1  into affected eye     diltiazem (CARDIZEM CD) 120 MG 24 hr capsule TAKE 1 CAPSULE BY MOUTH EVERY DAY 90 capsule 3   DULERA 200-5 MCG/ACT AERO Inhale 2 puffs into the lungs 2 (two) times daily.  5   Estradiol (VAGIFEM) 10 MCG TABS vaginal tablet Place 1 tablet (10 mcg total) vaginally 2 (two) times a week. 24 tablet 3   gabapentin (NEURONTIN) 100 MG capsule Take 1 capsule (100 mg total) by mouth at bedtime. 90 capsule 0   montelukast (SINGULAIR) 10 MG tablet Take 10 mg by mouth at bedtime.       Omega-3 Fatty Acids (OMEGA 3 PO) Take 1 capsule by mouth daily.       OVER THE COUNTER MEDICATION Metagenics Ultra Flora IB Probiotic     OVER THE COUNTER MEDICATION 2 (two) times daily. Cysta Q     PROAIR HFA 108 (90 BASE) MCG/ACT inhaler Inhale 2 puffs into the lungs every 6 (six) hours as needed for wheezing or shortness of breath.   11   rosuvastatin (CRESTOR) 5 MG tablet Take 1 tablet (5 mg total) by mouth daily at 6 PM. 90 tablet 3   sertraline (ZOLOFT) 50 MG tablet Take 25 mg by mouth daily.     EPINEPHrine 0.3 mg/0.3 mL IJ SOAJ injection Inject 1 Dose into the muscle as needed. (Patient not taking: Reported on 08/31/2021)     ketorolac (TORADOL) 10 MG tablet Take 10 mg by mouth  every 6 (six) hours as needed. (Patient not taking: No sig reported)     Meth-Hyo-M Bl-Na Phos-Ph Sal (URO-MP) 118 MG CAPS Take 1 capsule by mouth 4 (four) times daily as needed (bladder).  (Patient not taking: No sig reported)     Current  Facility-Administered Medications  Medication Dose Route Frequency Provider Last Rate Last Admin   0.9 %  sodium chloride infusion  500 mL Intravenous Continuous Ladene Artist, MD       0.9 %  sodium chloride infusion  500 mL Intravenous Once Ladene Artist, MD        Allergies as of 08/31/2021 - Review Complete 08/31/2021  Allergen Reaction Noted   Codeine Nausea And Vomiting and Other (See Comments) 06/16/2021   Lipitor [atorvastatin] Other (See Comments) 06/20/2012   Latex Hives, Rash, and Other (See Comments) 12/30/2015    Family History  Problem Relation Age of Onset   Colon polyps Mother    Asthma Mother    Hypertension Mother    Osteopenia Mother    Coronary artery disease Father    Hypertension Father    Heart disease Father    Heart attack Father    Skin cancer Sister        melanoma   Hepatitis C Brother    Skin cancer Brother        melanoma   Stomach cancer Maternal Aunt    Pancreatic cancer Maternal Aunt    Colon polyps Maternal Grandmother    Alzheimer's disease Maternal Grandmother    Lung cancer Maternal Grandfather    Heart attack Maternal Grandfather    Heart failure Paternal Grandfather    Heart attack Paternal Grandfather    Colon cancer Neg Hx    Rectal cancer Neg Hx    Breast cancer Neg Hx    Esophageal cancer Neg Hx     Social History   Socioeconomic History   Marital status: Married    Spouse name: Not on file   Number of children: 1   Years of education: Not on file   Highest education level: Not on file  Occupational History   Occupation: retired    Fish farm manager: UNEMPLOYED  Tobacco Use   Smoking status: Never   Smokeless tobacco: Never  Vaping Use   Vaping Use: Never used  Substance and Sexual  Activity   Alcohol use: Yes    Alcohol/week: 3.0 standard drinks    Types: 3 Standard drinks or equivalent per week    Comment: social    Drug use: No   Sexual activity: Yes    Partners: Male    Birth control/protection: Surgical    Comment: Hyst  Other Topics Concern   Not on file  Social History Narrative   Not on file   Social Determinants of Health   Financial Resource Strain: Not on file  Food Insecurity: Not on file  Transportation Needs: Not on file  Physical Activity: Not on file  Stress: Not on file  Social Connections: Not on file  Intimate Partner Violence: Not on file    Review of Systems:  All systems reviewed an negative except where noted in HPI.  Gen: Denies any fever, chills, sweats, anorexia, fatigue, weakness, malaise, weight loss, and sleep disorder CV: Denies chest pain, angina, palpitations, syncope, orthopnea, PND, peripheral edema, and claudication. Resp: Denies dyspnea at rest, dyspnea with exercise, cough, sputum, wheezing, coughing up blood, and pleurisy. GI: Denies vomiting blood, jaundice, and fecal incontinence.   Denies dysphagia or odynophagia. GU : Denies urinary burning, blood in urine, urinary frequency, urinary hesitancy, nocturnal urination, and urinary incontinence. MS: Denies joint pain, limitation of movement, and swelling, stiffness, low back pain, extremity pain. Denies muscle weakness, cramps, atrophy.  Derm: Denies rash, itching, dry skin, hives, moles, warts,  or unhealing ulcers.  Psych: Denies depression, anxiety, memory loss, suicidal ideation, hallucinations, paranoia, and confusion. Heme: Denies bruising, bleeding, and enlarged lymph nodes. Neuro:  Denies any headaches, dizziness, paresthesias. Endo:  Denies any problems with DM, thyroid, adrenal function.   Physical Exam: General:  Alert, well-developed, in NAD Head:  Normocephalic and atraumatic. Eyes:  Sclera clear, no icterus.   Conjunctiva pink. Ears:  Normal  auditory acuity. Mouth:  No deformity or lesions.  Neck:  Supple; no masses . Lungs:  Clear throughout to auscultation.   No wheezes, crackles, or rhonchi. No acute distress. Heart:  Regular rate and rhythm; no murmurs. Abdomen:  Soft, nondistended, nontender. No masses, hepatomegaly. No obvious masses.  Normal bowel .    Rectal:  Deferred   Msk:  Symmetrical without gross deformities.. Pulses:  Normal pulses noted. Extremities:  Without edema. Neurologic:  Alert and  oriented x4;  grossly normal neurologically. Skin:  Intact without significant lesions or rashes. Cervical Nodes:  No significant cervical adenopathy. Psych:  Alert and cooperative. Normal mood and affect.   Impression / Plan:   Personal history of adenomatous colon polyps.  IBS.  For surveillance colonoscopy.    This patient is appropriate for endoscopic procedures in the ambulatory setting.    Pricilla Riffle. Fuller Plan  08/31/2021, 9:16 AM See Shea Evans, [facility], to contact our on call provider

## 2021-08-31 NOTE — Op Note (Signed)
[facility] Patient Name: Lauren Montes Procedure Date: 08/31/2021 9:13 AM MRN: [id number] Endoscopist: Ladene Artist , MD Age: 65 Referring MD:  Date of Birth: October 05, 1956 Gender: Female Account #: [id number] Procedure:                Colonoscopy Indications:              Surveillance: Personal history of adenomatous                            polyps on last colonoscopy 5 years ago Medicines:                Monitored Anesthesia Care Procedure:                Pre-Anesthesia Assessment:                           - Prior to the procedure, a History and Physical                            was performed, and patient medications and                            allergies were reviewed. The patient's tolerance of                            previous anesthesia was also reviewed. The risks                            and benefits of the procedure and the sedation                            options and risks were discussed with the patient.                            All questions were answered, and informed consent                            was obtained. Prior Anticoagulants: The patient has                            taken no previous anticoagulant or antiplatelet                            agents. ASA Grade Assessment: II - A patient with                            mild systemic disease. After reviewing the risks                            and benefits, the patient was deemed in                            satisfactory condition to undergo the procedure.  After obtaining informed consent, the colonoscope                            was passed under direct vision. Throughout the                            procedure, the patient's blood pressure, pulse, and                            oxygen saturations were monitored continuously. The                            Olympus PCF-H190DL (#[id number]) Colonoscope was                            introduced through the  anus and advanced to the the                            cecum, identified by appendiceal orifice and                            ileocecal valve. The ileocecal valve, appendiceal                            orifice, and rectum were photographed. The quality                            of the bowel preparation was good. The colonoscopy                            was performed without difficulty. The patient                            tolerated the procedure well. Scope In: 9:19:34 AM Scope Out: 9:34:24 AM Scope Withdrawal Time: 0 hours 10 minutes 1 second  Total Procedure Duration: 0 hours 14 minutes 50 seconds  Findings:                 The perianal and digital rectal examinations were                            normal.                           A 7 mm polyp was found in the transverse colon. The                            polyp was sessile. The polyp was removed with a                            cold snare. Resection and retrieval were complete.                           The exam was otherwise without abnormality on  direct and retroflexion views. Complications:            No immediate complications. Estimated blood loss:                            None. Estimated Blood Loss:     Estimated blood loss: none. Impression:               - One 7 mm polyp in the transverse colon, removed                            with a cold snare. Resected and retrieved.                           - The examination was otherwise normal on direct                            and retroflexion views. Recommendation:           - Repeat colonoscopy after studies are complete for                            surveillance based on pathology results.                           - Patient has a contact number available for                            emergencies. The signs and symptoms of potential                            delayed complications were discussed with the                             patient. Return to normal activities tomorrow.                            Written discharge instructions were provided to the                            patient.                           - Resume previous diet.                           - Continue present medications.                           - Resume IBgard 1-2 po tid.                           - Await pathology results. Ladene Artist, MD 08/31/2021 9:37:57 AM This report has been signed electronically.

## 2021-08-31 NOTE — Progress Notes (Signed)
Called to room to assist during endoscopic procedure.  Patient ID and intended procedure confirmed with present staff. Received instructions for my participation in the procedure from the performing physician.  

## 2021-08-31 NOTE — Patient Instructions (Signed)
Handout on polyps given to patient. Await pathology results. Repeat colonoscopy for surveillance will be determined based off of pathology results. Resume IBgard 1-2 tablets three times daily. Resume previous diet and continue present medications.   YOU HAD AN ENDOSCOPIC PROCEDURE TODAY AT [facility]:   Refer to the procedure report that was given to you for any specific questions about what was found during the examination.  If the procedure report does not answer your questions, please call your gastroenterologist to clarify.  If you requested that your care partner not be given the details of your procedure findings, then the procedure report has been included in a sealed envelope for you to review at your convenience later.  YOU SHOULD EXPECT: Some feelings of bloating in the abdomen. Passage of more gas than usual.  Walking can help get rid of the air that was put into your GI tract during the procedure and reduce the bloating. If you had a lower endoscopy (such as a colonoscopy or flexible sigmoidoscopy) you may notice spotting of blood in your stool or on the toilet paper. If you underwent a bowel prep for your procedure, you may not have a normal bowel movement for a few days.  Please Note:  You might notice some irritation and congestion in your nose or some drainage.  This is from the oxygen used during your procedure.  There is no need for concern and it should clear up in a day or so.  SYMPTOMS TO REPORT IMMEDIATELY:  Following lower endoscopy (colonoscopy or flexible sigmoidoscopy):  Excessive amounts of blood in the stool  Significant tenderness or worsening of abdominal pains  Swelling of the abdomen that is new, acute  Fever of 100F or higher  For urgent or emergent issues, a gastroenterologist can be reached at any hour by calling [phone]. Do not use MyChart messaging for urgent concerns.    DIET:  We do recommend a small meal at first, but then you  may proceed to your regular diet.  Drink plenty of fluids but you should avoid alcoholic beverages for 24 hours.  ACTIVITY:  You should plan to take it easy for the rest of today and you should NOT DRIVE or use heavy machinery until tomorrow (because of the sedation medicines used during the test).    FOLLOW UP: Our staff will call the number listed on your records 48-72 hours following your procedure to check on you and address any questions or concerns that you may have regarding the information given to you following your procedure. If we do not reach you, we will leave a message.  We will attempt to reach you two times.  During this call, we will ask if you have developed any symptoms of COVID 19. If you develop any symptoms (ie: fever, flu-like symptoms, shortness of breath, cough etc.) before then, please call [phone].  If you test positive for Covid 19 in the 2 weeks post procedure, please call and report this information to [place].    If any biopsies were taken you will be contacted by phone or by letter within the next 1-3 weeks.  Please call us at [phone] if you have not heard about the biopsies in 3 weeks.    SIGNATURES/CONFIDENTIALITY: You and/or your care partner have signed paperwork which will be entered into your electronic medical record.  These signatures attest to the fact that that the information above on your After Visit Summary has been reviewed and is  understood.  Full responsibility of the confidentiality of this discharge information lies with you and/or your care-partner.

## 2021-08-31 NOTE — Progress Notes (Signed)
Sedate, gd SR, tolerated procedure well, VSS, report to RN 

## 2021-08-31 NOTE — Progress Notes (Signed)
Pt's states no medical or surgical changes since previsit or office visit. 

## 2021-09-01 DIAGNOSIS — N3010 Interstitial cystitis (chronic) without hematuria: Secondary | ICD-10-CM | POA: Diagnosis not present

## 2021-09-02 ENCOUNTER — Telehealth: Payer: Self-pay

## 2021-09-02 NOTE — Telephone Encounter (Signed)
  Follow up Call-  Call back number 08/31/2021  Post procedure Call Back phone  # [phone]  Permission to leave phone message Yes  Some recent data might be hidden     Patient questions:  Do you have a fever, pain , or abdominal swelling? No. Pain Score  0 *  Have you tolerated food without any problems? Yes.    Have you been able to return to your normal activities? Yes.    Do you have any questions about your discharge instructions: Diet   No. Medications  No. Follow up visit  No.  Do you have questions or concerns about your Care? No.  Actions: * If pain score is 4 or above: No action needed, pain <4.   Have you developed a fever since your procedure? no  2.   Have you had an respiratory symptoms (SOB or cough) since your procedure? no  3.   Have you tested positive for COVID 19 since your procedure no  4.   Have you had any family members/close contacts diagnosed with the COVID 19 since your procedure?  no   If yes to any of these questions please route to Joylene John, RN and Joella Prince, RN

## 2021-09-07 ENCOUNTER — Ambulatory Visit: Payer: Medicare Other | Admitting: Podiatry

## 2021-09-10 ENCOUNTER — Encounter: Payer: Self-pay | Admitting: Gastroenterology

## 2021-09-14 ENCOUNTER — Other Ambulatory Visit: Payer: Self-pay | Admitting: Obstetrics and Gynecology

## 2021-09-14 DIAGNOSIS — Z1231 Encounter for screening mammogram for malignant neoplasm of breast: Secondary | ICD-10-CM

## 2021-10-25 ENCOUNTER — Other Ambulatory Visit: Payer: Self-pay | Admitting: Physician Assistant

## 2021-10-26 NOTE — Telephone Encounter (Signed)
Next Visit: 01/27/2022  Last Visit: 07/28/2021  Last Fill: 07/28/2021  Dx: Fibromyalgia  Current Dose per office note on 07/28/2021: has been able to reduce the dose of gabapentin to 100 mg 1 capsule at bedtime  Okay to refill Gabapentin?

## 2021-10-27 ENCOUNTER — Ambulatory Visit
Admission: RE | Admit: 2021-10-27 | Discharge: 2021-10-27 | Disposition: A | Discharge status: Home / Self Care | Payer: Medicare Other | Source: Ambulatory Visit | Attending: Obstetrics and Gynecology | Admitting: Obstetrics and Gynecology

## 2021-10-27 DIAGNOSIS — Z1231 Encounter for screening mammogram for malignant neoplasm of breast: Secondary | ICD-10-CM

## 2021-11-29 ENCOUNTER — Encounter: Payer: Self-pay | Admitting: Cardiovascular Disease

## 2021-11-29 NOTE — Progress Notes (Signed)
Lauren Montes Date of Birth  1956-03-17 [facility] / [facility] [address] .[place], [place]  [address] [phone]  Fax  [phone]  1. Hyperlipidemia 2. Sinus tachycardia, SVT 3. Chronic asthma     Lauren Montes is a middle-age female the history of mild diastolic dysfunction as well as leg edema. She also has a history of hypercholesterolemia.   Has a history of hyperlipidemia. We have had her on Crestor the past which she tolerated very well. Her insurance and he refused to pay for to try her on atorvastatin. This caused her to have lots of muscle aches as well as memory loss. We discontinued the atorvastatin at that time.  She has been recording her heart rate and blood pressure readings. Her heart rate is frequently above 100. He also has many readings in the 80-90 range.  Has bruising particularly on her hands and on her fingers. [name] never had any easy bleeding. She did not have any bleeding  complications. during her pregnancies or deliveries.  Feb. 4, 2014: Lauren Montes is doing well.  Her HR and BP have been well controlled.   June 17, 2013:  Lauren Montes is doing well.  She has started waking over the past several weeks.    June 21, 2014:  Lauren Montes is having some issues with DOE.    Perhaps slight chest pressure with exertion.  No pain. .  Sleeps on 2 pillows occasionally at night - not every night.  [name] very careful with her salt intake. She and her husband thinks most of her meals of scratch. She denies any leg swelling   Nov. 17, 2015:  Lauren Montes is doing well.  She is seen for chronic dyspnea. Her echo was normal.  myoview showed no ischemia Occasional heart racing.  Not severe.  Does not take any meds.  Resolves after several minutes. She is walking for exercise - 30-45 minutes 3 times a week.  Also do some strength training  Nov. 17, 2016: Overall doing ok Lots of stress - aging parents  Lots of muscle cramps in  legs and feet.   Feb. 27, 2017:  Lauren Montes is seen back  Cholesterol is back up - she was not able to takes atorvastatin or Pravachol because of severe muscle aches and cramping  And mental foggyness. She has  Tried  Crestor in the past and it caused muscle cramps as well . [name] now on Zetia.  May 18 , 2017:  Lauren Montes is seen today for a work in visit.  She has been having lots of swelling in her feet and hands ( feet > hands)  We gave her Lasix and Kdur to take for several days.  She took the Lasix and Kdur for 2 days .   Has not needed it sense  has had some fatigue  Has not really eaten any more salt.  This has been occuring off and of for the past 6 weeks.  Is having palpitations at night  She has had increased anxiety.  Still able to do all of her usual activities without dyspnea.   She recently stopped taking her low dose estrogen ( 2-3 weeks ago )   Nov. 20, 2017:     No issues,  No SVT Takes 1/2 zoloft a day - anxiety may have been plaing a part  Has really cut down on the cholesterol in her diet  Getting exercise. Has lost 20 lbs.  Would like to  decrease the crestor to every other day instead of daily .  Nov. 20, 2018  Doing great No significant  Tachycardia Has greatly reduced her meat intake.   Has a seafood allergy Eats some chicken.    Lots of beans .     Nov. 20, 2019  Lauren Montes is seen today  Doing very well  BP and HR are well controlled.  Has occasional episodes of tachycardia , esp at night or if she gets overexerted. Has not been exercising  Seems to occur daily  Always when she lies down at night   Some of her symptoms sound like volume depletion She drinks water all day long but then says that she has heart pounding frequently through the day.  She is having issues with her IBS .  Has not been eating much protein   November 11, 2019: Lauren Montes is seen today for follow-up of her hypertension and supraventricular tachycardia. Has been diagnosed with  interstitial cyctitis .  Is on lots of pain meds.  Still able to exercise failry well Trying to walk regularly   Jan. 3, 2022 Lauren Montes is seen today for follow up of her HTN and SVT. No cardiac issues. Has some leg cramps  - is walking 3 times a week , 45 minutes each time .   Jan. 24, 2023 Lauren Montes is seen for follow up of her HTN and SVT Her Raynaulds symptoms are worse this year   By is on the low side  Suggested liquid iv in her water   She has been having leg aches/muscle cramps.  She thinks it may be related to rosuvastatin.  We will stop the rosuvastatin for a couple of weeks and see if this helps these leg aches/cramps.  If she does end up stopping the rosuvastatin we will get a coronary calcium score to assess her long-term risk of coronary artery disease  Current Outpatient Medications  Medication Sig Dispense Refill   albuterol (VENTOLIN HFA) 108 (90 Base) MCG/ACT inhaler      Aloe Vera 25 MG CAPS Take by mouth.     cetirizine (ZYRTEC) 10 MG tablet Take 10 mg by mouth daily.     Cholecalciferol (VITAMIN D3 PO) Take 1,000 Units by mouth daily.     Coenzyme Q10 (COQ10) 200 MG CAPS      cycloSPORINE (RESTASIS) 0.05 % ophthalmic emulsion Place 1 drop into both eyes 2 (two) times daily.     cycloSPORINE (RESTASIS) 0.05 % ophthalmic emulsion 1  into affected eye     diltiazem (CARDIZEM CD) 120 MG 24 hr capsule TAKE 1 CAPSULE BY MOUTH EVERY DAY 90 capsule 3   DULERA 200-5 MCG/ACT AERO Inhale 2 puffs into the lungs 2 (two) times daily.  5   EPINEPHrine 0.3 mg/0.3 mL IJ SOAJ injection Inject 1 Dose into the muscle as needed.     Estradiol (VAGIFEM) 10 MCG TABS vaginal tablet Place 1 tablet (10 mcg total) vaginally 2 (two) times a week. 24 tablet 3   gabapentin (NEURONTIN) 100 MG capsule TAKE 1 CAPSULE BY MOUTH AT BEDTIME. 90 capsule 0   ketorolac (TORADOL) 10 MG tablet Take 10 mg by mouth every 6 (six) hours as needed.     montelukast (SINGULAIR) 10 MG tablet Take 10 mg by mouth at  bedtime.       Omega-3 Fatty Acids (OMEGA 3 PO) Take 1 capsule by mouth daily.       OVER THE COUNTER MEDICATION Metagenics Ultra Flora IB Probiotic  OVER THE COUNTER MEDICATION 2 (two) times daily. Cysta Q     rosuvastatin (CRESTOR) 5 MG tablet Take 1 tablet (5 mg total) by mouth daily at 6 PM. 90 tablet 3   sertraline (ZOLOFT) 50 MG tablet Take 25 mg by mouth daily.     No current facility-administered medications for this visit.     Allergies  Allergen Reactions   Codeine Nausea And Vomiting and Other (See Comments)    Migraine headache   Lipitor [Atorvastatin] Other (See Comments)    Muscle ache, mind foggy,    Azelastine Hcl     Other reaction(s): Other (See Comments)   Latex Hives, Rash and Other (See Comments)    Past Medical History:  Diagnosis Date   Asthma    Atrophic vaginitis    Blood transfusion without reported diagnosis 1975   with vaginal delivery--very anemic   Cystic teratoma    Elevated cholesterol    Endometriosis    Esophageal ulcer    Fibromyalgia    Fundic gland polyps of stomach, benign    GERD (gastroesophageal reflux disease)    History of diastolic dysfunction    History of migraine headaches    Leg edema    left leg   Ocular rosacea    Osteoarthritis    back,hands, knees   Overactive bladder    Raynaud's disease    Status post cystoscopy 09/06/2019   [facility]   Tachycardia    Tubular adenoma of colon 08/2016    Past Surgical History:  Procedure Laterality Date   APPENDECTOMY     BACK SURGERY     BREAST EXCISIONAL BIOPSY Left    BREAST SURGERY  10-09-13   breast reduction--Dr. Dessie Coma   BUNIONECTOMY     bilateral   CARDIOVASCULAR STRESS TEST  07/02/2008   EF 55-60%   colonscopy     DENTAL SURGERY  2007   ESOPHAGOGASTRODUODENOSCOPY ENDOSCOPY     FOOT SURGERY     LEFT FOOT   left breast mass removed     OOPHORECTOMY  1982   RSO partial and LSO, uterus   REDUCTION MAMMAPLASTY Bilateral    RSO  2002   partial    ruptured disc surgery  07,08   TOTAL ABDOMINAL HYSTERECTOMY      Social History   Tobacco Use  Smoking Status Never  Smokeless Tobacco Never    Social History   Substance and Sexual Activity  Alcohol Use Yes   Alcohol/week: 3.0 standard drinks   Types: 3 Standard drinks or equivalent per week   Comment: social     Family History  Problem Relation Age of Onset   Colon polyps Mother    Asthma Mother    Hypertension Mother    Osteopenia Mother    Coronary artery disease Father    Hypertension Father    Heart disease Father    Heart attack Father    Skin cancer Sister        melanoma   Hepatitis C Brother    Skin cancer Brother        melanoma   Stomach cancer Maternal Aunt    Pancreatic cancer Maternal Aunt    Colon polyps Maternal Grandmother    Alzheimer's disease Maternal Grandmother    Lung cancer Maternal Grandfather    Heart attack Maternal Grandfather    Heart failure Paternal Grandfather    Heart attack Paternal Grandfather    Colon cancer Neg Hx    Rectal cancer Neg Hx  Breast cancer Neg Hx    Esophageal cancer Neg Hx     Reviw of Systems:  Reviewed in the HPI.  All other systems are negative.   Physical Exam: Blood pressure 114/78, pulse 67, height 5' 5.5" (1.664 m), weight 141 lb (64 kg), last menstrual period 11/07/1980, SpO2 98 %.  GEN:  Well nourished, well developed in no acute distress HEENT: Normal NECK: No JVD; No carotid bruits LYMPHATICS: No lymphadenopathy CARDIAC: RRR RESPIRATORY:  Clear to auscultation without rales, wheezing or rhonchi  ABDOMEN: Soft, non-tender, non-distended MUSCULOSKELETAL:  No edema; No deformity  SKIN: Warm and dry NEUROLOGIC:  Alert and oriented x 3   ECG:    November 30, 2021: Normal sinus rhythm at 67.  Incomplete right bundle branch block.  No ST or T wave changes.  No significant changes from previous EKG.   Assessment / Plan:     1. Essential hypertension:    Blood pressure is well controlled.   Continue to follow.  2. Hyperlipidemia: She is having some leg aches that she is described as cramps.  She thinks that this may be due to her rosuvastatin.  She will hold the rosuvastatin for couple weeks and see if this improves her symptoms.    3. Supraventricular  Tachycardia:  She is not having any significant episodes of SVT.  She is having worsening of her Raynaud's phenomenon.  We will discontinue the diltiazem and start her on amlodipine 2.5 mg a day.  We will start on metoprolol XL 25 mg a day for her SVT.  4.  Raynaud's phenomenon: She is having significant Raynaud's.  The weather has been a bit colder recently.  We will discontinue the diltiazem and start her on amlodipine 2.5 mg a day.  We will be adding metoprolol XL to help with her SVT.  I will see her again in 6 months for a follow-up visit.    Mertie Moores, MD  11/30/2021 3:40 PM    [facility] [address],  [place], [place]  [address] Pager [phone] Phone: [phone]; Fax: [phone]

## 2021-11-30 ENCOUNTER — Other Ambulatory Visit: Payer: Self-pay

## 2021-11-30 ENCOUNTER — Encounter: Payer: Self-pay | Admitting: Cardiovascular Disease

## 2021-11-30 ENCOUNTER — Ambulatory Visit (INDEPENDENT_AMBULATORY_CARE_PROVIDER_SITE_OTHER): Payer: Medicare Other | Admitting: Cardiovascular Disease

## 2021-11-30 VITALS — BP 114/78 | HR 67 | Ht 65.5 in | Wt 141.0 lb

## 2021-11-30 DIAGNOSIS — I471 Supraventricular tachycardia: Secondary | ICD-10-CM | POA: Diagnosis not present

## 2021-11-30 DIAGNOSIS — I10 Essential (primary) hypertension: Secondary | ICD-10-CM

## 2021-11-30 DIAGNOSIS — I7300 Raynaud's syndrome without gangrene: Secondary | ICD-10-CM | POA: Diagnosis not present

## 2021-11-30 MED ORDER — AMLODIPINE BESYLATE 2.5 MG PO TABS: 2.5000 mg | ORAL_TABLET | Freq: Every day | ORAL | 3 refills | Status: DC

## 2021-11-30 MED ORDER — METOPROLOL SUCCINATE ER 25 MG PO TB24: 25.0000 mg | ORAL_TABLET | Freq: Every day | ORAL | 3 refills | Status: DC

## 2021-11-30 NOTE — Patient Instructions (Signed)
Medication Instructions:  Your physician has recommended you make the following change in your medication:   STOP Diltiazem  START Amlodipine 2.5 mg taking 1 daily  START Toprol Xl 25 mg taking 1 daily  *If you need a refill on your cardiac medications before your next appointment, please call your pharmacy*   Lab Work: None ordered  If you have labs (blood work) drawn today and your tests are completely normal, you will receive your results only by: [place] (if you have MyChart) OR A paper copy in the mail If you have any lab test that is abnormal or we need to change your treatment, we will call you to review the results.   Testing/Procedures: None ordered   Follow-Up: At [facility], you and your health needs are our priority.  As part of our continuing mission to provide you with exceptional heart care, we have created designated Provider Care Teams.  These Care Teams include your primary Cardiologist (physician) and Advanced Practice Providers (APPs -  Physician Assistants and Nurse Practitioners) who all work together to provide you with the care you need, when you need it.  We recommend signing up for the patient portal called "MyChart".  Sign up information is provided on this After Visit Summary.  MyChart is used to connect with patients for Virtual Visits (Telemedicine).  Patients are able to view lab/test results, encounter notes, upcoming appointments, etc.  Non-urgent messages can be sent to your provider as well.   To learn more about what you can do with MyChart, go to [email or web address].    Your next appointment:   6 month(s)  The format for your next appointment:   In Person  Provider:   Mertie Moores, MD     Other Instructions

## 2021-12-01 ENCOUNTER — Other Ambulatory Visit: Payer: Self-pay | Admitting: Cardiovascular Disease

## 2021-12-09 ENCOUNTER — Encounter: Payer: Self-pay | Admitting: Cardiovascular Disease

## 2021-12-09 ENCOUNTER — Other Ambulatory Visit: Payer: Self-pay | Admitting: Cardiovascular Disease

## 2021-12-09 DIAGNOSIS — I7300 Raynaud's syndrome without gangrene: Secondary | ICD-10-CM

## 2021-12-09 MED ORDER — DILTIAZEM HCL ER COATED BEADS 120 MG PO CP24: 120.0000 mg | ORAL_CAPSULE | Freq: Every day | ORAL | 3 refills | Status: DC

## 2021-12-09 MED ORDER — SILDENAFIL CITRATE 50 MG PO TABS: 25.0000 mg | ORAL_TABLET | Freq: Two times a day (BID) | ORAL | 2 refills | Status: DC

## 2021-12-10 NOTE — Telephone Encounter (Signed)
Left a message to call back. MD orders followed as ordered.

## 2022-01-05 HISTORY — PX: EYE SURGERY: SHX253

## 2022-01-10 DIAGNOSIS — H53453 Other localized visual field defect, bilateral: Secondary | ICD-10-CM | POA: Diagnosis not present

## 2022-01-10 DIAGNOSIS — H02423 Myogenic ptosis of bilateral eyelids: Secondary | ICD-10-CM | POA: Diagnosis not present

## 2022-01-10 DIAGNOSIS — H02413 Mechanical ptosis of bilateral eyelids: Secondary | ICD-10-CM | POA: Diagnosis not present

## 2022-01-13 NOTE — Progress Notes (Unsigned)
Office Visit Note  Patient: Lauren Montes             Date of Birth: 02-13-1956           MRN: [id number]             PCP: Haywood Pao, MD Referring: Haywood Pao, MD Visit Date: 01/27/2022 Occupation: '@GUAROCC'$ @  Subjective:  No chief complaint on file.   History of Present Illness: Deepa Barthel is a 66 y.o. female ***   Activities of Daily Living:  Patient reports morning stiffness for *** {minute/hour:19697}.   Patient {ACTIONS;DENIES/REPORTS:21021675::"Denies"} nocturnal pain.  Difficulty dressing/grooming: {ACTIONS;DENIES/REPORTS:21021675::"Denies"} Difficulty climbing stairs: {ACTIONS;DENIES/REPORTS:21021675::"Denies"} Difficulty getting out of chair: {ACTIONS;DENIES/REPORTS:21021675::"Denies"} Difficulty using hands for taps, buttons, cutlery, and/or writing: {ACTIONS;DENIES/REPORTS:21021675::"Denies"}  No Rheumatology ROS completed.   PMFS History:  Patient Active Problem List   Diagnosis Date Noted   Annual physical exam 08/03/2021   Arthritis 07/09/2021   Prolapse of female genital organs 07/10/2019   Chest tightness 02/21/2019   Interstitial cystitis (chronic) without hematuria 01/07/2019   Irritable bowel syndrome 12/30/2016   SVT (supraventricular tachycardia) ([place]) 09/26/2016   Raynaud's disease 06/28/2016   HTN (hypertension) 09/24/2015   Palpitations 03/05/2015   Fibromyalgia 03/05/2015   Chronic diastolic CHF (congestive heart failure), NYHA class 2 ([place]) 06/20/2014   Sinus tachycardia 09/14/2012   Dysphagia 06/29/2012   Ocular rosacea    Chest tightness, discomfort, or pressure 09/09/2011   Hyperlipidemia 05/10/2011   SWELLING OF LIMB 01/20/2011   Moderate recurrent major depression ([place]) 04/19/2010   ABDOMINAL PAIN-EPIGASTRIC 02/03/2010   ABDOMINAL PAIN-MULTIPLE SITES 02/03/2010   Hormone replacement therapy 10/18/2009   Migraine 10/18/2009   Moderate persistent asthma, uncomplicated 01/77/9390   Spondylosis  without myelopathy 10/18/2009   CONSTIPATION 09/19/2008   ABDOMINAL PAIN, LEFT LOWER QUADRANT 09/19/2008   COLONIC POLYPS, HYPERPLASTIC, HX OF 09/19/2008    Past Medical History:  Diagnosis Date   Asthma    Atrophic vaginitis    Blood transfusion without reported diagnosis 1975   with vaginal delivery--very anemic   Cystic teratoma    Elevated cholesterol    Endometriosis    Esophageal ulcer    Fibromyalgia    Fundic gland polyps of stomach, benign    GERD (gastroesophageal reflux disease)    History of diastolic dysfunction    History of migraine headaches    Leg edema    left leg   Ocular rosacea    Osteoarthritis    back,hands, knees   Overactive bladder    Raynaud's disease    Status post cystoscopy 09/06/2019   [facility]   Tachycardia    Tubular adenoma of colon 08/2016    Family History  Problem Relation Age of Onset   Colon polyps Mother    Asthma Mother    Hypertension Mother    Osteopenia Mother    Coronary artery disease Father    Hypertension Father    Heart disease Father    Heart attack Father    Skin cancer Sister        melanoma   Hepatitis C Brother    Skin cancer Brother        melanoma   Stomach cancer Maternal Aunt    Pancreatic cancer Maternal Aunt    Colon polyps Maternal Grandmother    Alzheimer's disease Maternal Grandmother    Lung cancer Maternal Grandfather    Heart attack Maternal Grandfather    Heart failure Paternal Grandfather    Heart attack Paternal  Grandfather    Colon cancer Neg Hx    Rectal cancer Neg Hx    Breast cancer Neg Hx    Esophageal cancer Neg Hx    Past Surgical History:  Procedure Laterality Date   APPENDECTOMY     BACK SURGERY     BREAST EXCISIONAL BIOPSY Left    BREAST SURGERY  10-09-13   breast reduction--Dr. Holderness   BUNIONECTOMY     bilateral   CARDIOVASCULAR STRESS TEST  07/02/2008   EF 55-60%   colonscopy     DENTAL SURGERY  2007   ESOPHAGOGASTRODUODENOSCOPY ENDOSCOPY     FOOT SURGERY      LEFT FOOT   left breast mass removed     OOPHORECTOMY  1982   RSO partial and LSO, uterus   REDUCTION MAMMAPLASTY Bilateral    RSO  2002   partial   ruptured disc surgery  07,08   TOTAL ABDOMINAL HYSTERECTOMY     Social History   Social History Narrative   Not on file   Immunization History  Administered Date(s) Administered   Influenza Split 08/17/2011, 08/14/2012, 08/15/2013, 08/06/2014   Influenza, Quadrivalent, Recombinant, Inj, Pf 07/04/2018, 08/08/2019, 07/29/2020   Influenza,inj,Quad PF,6+ Mos 08/15/2013, 08/06/2014, 08/08/2015   Influenza-Unspecified 06/28/2016, 07/07/2017   PFIZER(Purple Top)SARS-COV-2 Vaccination 01/06/2020, 02/08/2020, 08/30/2020   Pneumococcal Conjugate-13 06/28/2016   Pneumococcal Polysaccharide-23 12/01/2009   Td 06/04/2013   Tdap 12/01/2009   Zoster Recombinat (Shingrix) 07/08/2018, 01/03/2019   Zoster, Live 01/03/2019     Objective: Vital Signs: LMP 11/07/1980    Physical Exam   Musculoskeletal Exam: ***  CDAI Exam: CDAI Score: -- Patient Global: --; Provider Global: -- Swollen: --; Tender: -- Joint Exam 01/27/2022   No joint exam has been documented for this visit   There is currently no information documented on the homunculus. Go to the Rheumatology activity and complete the homunculus joint exam.  Investigation: No additional findings.  Imaging: No results found.  Recent Labs: Lab Results  Component Value Date   WBC 7.1 08/15/2016   HGB 13.9 08/15/2016   PLT 248.0 08/15/2016   NA 145 (H) 09/26/2017   K 4.2 09/26/2017   CL 104 09/26/2017   CO2 24 09/26/2017   GLUCOSE 79 09/26/2017   BUN 10 09/26/2017   CREATININE 0.67 09/26/2017   BILITOT 0.5 09/26/2017   ALKPHOS 91 09/26/2017   AST 27 09/26/2017   ALT 23 09/26/2017   PROT 7.1 09/26/2017   ALBUMIN 4.8 09/26/2017   CALCIUM 9.8 09/26/2017   GFRAA 110 09/26/2017    Speciality Comments: No specialty comments available.  Procedures:  No procedures  performed Allergies: Codeine, Lipitor [atorvastatin], Azelastine hcl, and Latex   Assessment / Plan:     Visit Diagnoses: No diagnosis found.  Orders: No orders of the defined types were placed in this encounter.  No orders of the defined types were placed in this encounter.   Face-to-face time spent with patient was *** minutes. Greater than 50% of time was spent in counseling and coordination of care.  Follow-Up Instructions: No follow-ups on file.   Earnestine Mealing, CMA  Note - This record has been created using Editor, commissioning.  Chart creation errors have been sought, but may not always  have been located. Such creation errors do not reflect on  the standard of medical care.

## 2022-01-16 ENCOUNTER — Encounter: Payer: Self-pay | Admitting: Cardiovascular Disease

## 2022-01-20 DIAGNOSIS — E7800 Pure hypercholesterolemia, unspecified: Secondary | ICD-10-CM | POA: Diagnosis not present

## 2022-01-20 DIAGNOSIS — Z0000 Encounter for general adult medical examination without abnormal findings: Secondary | ICD-10-CM | POA: Diagnosis not present

## 2022-01-27 ENCOUNTER — Ambulatory Visit (INDEPENDENT_AMBULATORY_CARE_PROVIDER_SITE_OTHER): Payer: Medicare Other | Admitting: Rheumatology

## 2022-01-27 ENCOUNTER — Encounter: Payer: Self-pay | Admitting: Rheumatology

## 2022-01-27 ENCOUNTER — Other Ambulatory Visit: Payer: Self-pay

## 2022-01-27 VITALS — BP 97/66 | HR 87 | Ht 65.75 in | Wt 141.8 lb

## 2022-01-27 DIAGNOSIS — M5416 Radiculopathy, lumbar region: Secondary | ICD-10-CM | POA: Diagnosis not present

## 2022-01-27 DIAGNOSIS — Z8669 Personal history of other diseases of the nervous system and sense organs: Secondary | ICD-10-CM | POA: Diagnosis not present

## 2022-01-27 DIAGNOSIS — Z1212 Encounter for screening for malignant neoplasm of rectum: Secondary | ICD-10-CM | POA: Diagnosis not present

## 2022-01-27 DIAGNOSIS — Z13820 Encounter for screening for osteoporosis: Secondary | ICD-10-CM | POA: Diagnosis not present

## 2022-01-27 DIAGNOSIS — M51369 Other intervertebral disc degeneration, lumbar region without mention of lumbar back pain or lower extremity pain: Secondary | ICD-10-CM

## 2022-01-27 DIAGNOSIS — R82998 Other abnormal findings in urine: Secondary | ICD-10-CM | POA: Diagnosis not present

## 2022-01-27 DIAGNOSIS — E7800 Pure hypercholesterolemia, unspecified: Secondary | ICD-10-CM | POA: Diagnosis not present

## 2022-01-27 DIAGNOSIS — Z8639 Personal history of other endocrine, nutritional and metabolic disease: Secondary | ICD-10-CM

## 2022-01-27 DIAGNOSIS — M7061 Trochanteric bursitis, right hip: Secondary | ICD-10-CM

## 2022-01-27 DIAGNOSIS — N3010 Interstitial cystitis (chronic) without hematuria: Secondary | ICD-10-CM | POA: Diagnosis not present

## 2022-01-27 DIAGNOSIS — M5136 Other intervertebral disc degeneration, lumbar region: Secondary | ICD-10-CM

## 2022-01-27 DIAGNOSIS — Z8709 Personal history of other diseases of the respiratory system: Secondary | ICD-10-CM

## 2022-01-27 DIAGNOSIS — Z0000 Encounter for general adult medical examination without abnormal findings: Secondary | ICD-10-CM | POA: Diagnosis not present

## 2022-01-27 DIAGNOSIS — M546 Pain in thoracic spine: Secondary | ICD-10-CM

## 2022-01-27 DIAGNOSIS — I7300 Raynaud's syndrome without gangrene: Secondary | ICD-10-CM | POA: Diagnosis not present

## 2022-01-27 DIAGNOSIS — F5101 Primary insomnia: Secondary | ICD-10-CM | POA: Diagnosis not present

## 2022-01-27 DIAGNOSIS — L718 Other rosacea: Secondary | ICD-10-CM | POA: Diagnosis not present

## 2022-01-27 DIAGNOSIS — Z8719 Personal history of other diseases of the digestive system: Secondary | ICD-10-CM

## 2022-01-27 DIAGNOSIS — R5382 Chronic fatigue, unspecified: Secondary | ICD-10-CM

## 2022-01-27 DIAGNOSIS — M797 Fibromyalgia: Secondary | ICD-10-CM | POA: Diagnosis not present

## 2022-01-27 DIAGNOSIS — M7062 Trochanteric bursitis, left hip: Secondary | ICD-10-CM

## 2022-01-27 DIAGNOSIS — J4540 Moderate persistent asthma, uncomplicated: Secondary | ICD-10-CM | POA: Diagnosis not present

## 2022-01-27 DIAGNOSIS — M25511 Pain in right shoulder: Secondary | ICD-10-CM | POA: Diagnosis not present

## 2022-01-27 DIAGNOSIS — G43909 Migraine, unspecified, not intractable, without status migrainosus: Secondary | ICD-10-CM | POA: Diagnosis not present

## 2022-01-27 DIAGNOSIS — K224 Dyskinesia of esophagus: Secondary | ICD-10-CM | POA: Diagnosis not present

## 2022-01-27 DIAGNOSIS — F331 Major depressive disorder, recurrent, moderate: Secondary | ICD-10-CM | POA: Diagnosis not present

## 2022-01-27 DIAGNOSIS — Z8679 Personal history of other diseases of the circulatory system: Secondary | ICD-10-CM

## 2022-01-27 DIAGNOSIS — D126 Benign neoplasm of colon, unspecified: Secondary | ICD-10-CM | POA: Diagnosis not present

## 2022-01-27 MED ORDER — GABAPENTIN 100 MG PO CAPS: 100.0000 mg | ORAL_CAPSULE | Freq: Every day | ORAL | 0 refills | Status: DC

## 2022-01-27 NOTE — Patient Instructions (Signed)

## 2022-01-31 DIAGNOSIS — H35372 Puckering of macula, left eye: Secondary | ICD-10-CM | POA: Diagnosis not present

## 2022-01-31 DIAGNOSIS — H2513 Age-related nuclear cataract, bilateral: Secondary | ICD-10-CM | POA: Diagnosis not present

## 2022-02-01 DIAGNOSIS — D2271 Melanocytic nevi of right lower limb, including hip: Secondary | ICD-10-CM | POA: Diagnosis not present

## 2022-02-01 DIAGNOSIS — D225 Melanocytic nevi of trunk: Secondary | ICD-10-CM | POA: Diagnosis not present

## 2022-02-01 DIAGNOSIS — L814 Other melanin hyperpigmentation: Secondary | ICD-10-CM | POA: Diagnosis not present

## 2022-02-01 DIAGNOSIS — L821 Other seborrheic keratosis: Secondary | ICD-10-CM | POA: Diagnosis not present

## 2022-02-01 DIAGNOSIS — D2261 Melanocytic nevi of right upper limb, including shoulder: Secondary | ICD-10-CM | POA: Diagnosis not present

## 2022-02-04 ENCOUNTER — Other Ambulatory Visit: Payer: Medicare Other

## 2022-02-14 DIAGNOSIS — M25511 Pain in right shoulder: Secondary | ICD-10-CM | POA: Diagnosis not present

## 2022-02-24 DIAGNOSIS — M25511 Pain in right shoulder: Secondary | ICD-10-CM | POA: Diagnosis not present

## 2022-03-02 ENCOUNTER — Other Ambulatory Visit: Payer: Self-pay | Admitting: Cardiovascular Disease

## 2022-03-03 DIAGNOSIS — M25511 Pain in right shoulder: Secondary | ICD-10-CM | POA: Diagnosis not present

## 2022-03-24 DIAGNOSIS — H25812 Combined forms of age-related cataract, left eye: Secondary | ICD-10-CM | POA: Diagnosis not present

## 2022-03-24 DIAGNOSIS — H2512 Age-related nuclear cataract, left eye: Secondary | ICD-10-CM | POA: Diagnosis not present

## 2022-03-31 DIAGNOSIS — H2511 Age-related nuclear cataract, right eye: Secondary | ICD-10-CM | POA: Diagnosis not present

## 2022-03-31 DIAGNOSIS — H25811 Combined forms of age-related cataract, right eye: Secondary | ICD-10-CM | POA: Diagnosis not present

## 2022-05-10 ENCOUNTER — Other Ambulatory Visit: Payer: Self-pay | Admitting: Rheumatology

## 2022-05-11 NOTE — Telephone Encounter (Signed)
Next Visit: 07/29/2022  Last Visit: 01/27/2022  Last Fill: 01/27/2022  Dx: Primary insomnia   Current Dose per office note on 01/27/2022: gabapentin to 100 mg 1 capsule at bedtime.    Okay to refill Gabapentin?

## 2022-05-28 ENCOUNTER — Encounter: Payer: Self-pay | Admitting: Cardiovascular Disease

## 2022-05-28 NOTE — Progress Notes (Unsigned)
Lauren Montes Date of Birth  June 25, 1956 [facility] / [facility] [address] .[place], [place]  [address] [phone]  Fax  [phone]  1. Hyperlipidemia 2. Sinus tachycardia, SVT 3. Chronic asthma     Lauren Montes is a middle-age female the history of mild diastolic dysfunction as well as leg edema. She also has a history of hypercholesterolemia.   Has a history of hyperlipidemia. We have had her on Crestor the past which she tolerated very well. Her insurance and he refused to pay for to try her on atorvastatin. This caused her to have lots of muscle aches as well as memory loss. We discontinued the atorvastatin at that time.  She has been recording her heart rate and blood pressure readings. Her heart rate is frequently above 100. He also has many readings in the 80-90 range.  Has bruising particularly on her hands and on her fingers. [name] never had any easy bleeding. She did not have any bleeding  complications. during her pregnancies or deliveries.  Feb. 4, 2014: Lauren Montes is doing well.  Her HR and BP have been well controlled.   June 17, 2013:  Lauren Montes is doing well.  She has started waking over the past several weeks.    June 21, 2014:  Lauren Montes is having some issues with DOE.    Perhaps slight chest pressure with exertion.  No pain. .  Sleeps on 2 pillows occasionally at night - not every night.  [name] very careful with her salt intake. She and her husband thinks most of her meals of scratch. She denies any leg swelling   Nov. 17, 2015:  Lauren Montes is doing well.  She is seen for chronic dyspnea. Her echo was normal.  myoview showed no ischemia Occasional heart racing.  Not severe.  Does not take any meds.  Resolves after several minutes. She is walking for exercise - 30-45 minutes 3 times a week.  Also do some strength training  Nov. 17, 2016: Overall doing ok Lots of stress - aging parents  Lots of muscle cramps in  legs and feet.   Feb. 27, 2017:  Lauren Montes is seen back  Cholesterol is back up - she was not able to takes atorvastatin or Pravachol because of severe muscle aches and cramping  And mental foggyness. She has  Tried  Crestor in the past and it caused muscle cramps as well . [name] now on Zetia.  May 18 , 2017:  Lauren Montes is seen today for a work in visit.  She has been having lots of swelling in her feet and hands ( feet > hands)  We gave her Lasix and Kdur to take for several days.  She took the Lasix and Kdur for 2 days .   Has not needed it sense  has had some fatigue  Has not really eaten any more salt.  This has been occuring off and of for the past 6 weeks.  Is having palpitations at night  She has had increased anxiety.  Still able to do all of her usual activities without dyspnea.   She recently stopped taking her low dose estrogen ( 2-3 weeks ago )   Nov. 20, 2017:     No issues,  No SVT Takes 1/2 zoloft a day - anxiety may have been plaing a part  Has really cut down on the cholesterol in her diet  Getting exercise. Has lost 20 lbs.  Would like to  decrease the crestor to every other day instead of daily .  Nov. 20, 2018  Doing great No significant  Tachycardia Has greatly reduced her meat intake.   Has a seafood allergy Eats some chicken.    Lots of beans .     Nov. 20, 2019  Lauren Montes is seen today  Doing very well  BP and HR are well controlled.  Has occasional episodes of tachycardia , esp at night or if she gets overexerted. Has not been exercising  Seems to occur daily  Always when she lies down at night   Some of her symptoms sound like volume depletion She drinks water all day long but then says that she has heart pounding frequently through the day.  She is having issues with her IBS .  Has not been eating much protein   November 11, 2019: Lauren Montes is seen today for follow-up of her hypertension and supraventricular tachycardia. Has been diagnosed with  interstitial cyctitis .  Is on lots of pain meds.  Still able to exercise failry well Trying to walk regularly   Jan. 3, 2022 Lauren Montes is seen today for follow up of her HTN and SVT. No cardiac issues. Has some leg cramps  - is walking 3 times a week , 45 minutes each time .   Jan. 24, 2023 Lauren Montes is seen for follow up of her HTN and SVT Her Raynaulds symptoms are worse this year   By is on the low side  Suggested liquid iv in her water   She has been having leg aches/muscle cramps.  She thinks it may be related to rosuvastatin.  We will stop the rosuvastatin for a couple of weeks and see if this helps these leg aches/cramps.  If she does end up stopping the rosuvastatin we will get a coronary calcium score to assess her long-term risk of coronary artery disease  May 30, 2022 Lauren Montes is seen for follow up of her HTN and SVT Had Raynaulds symptoms this past winter. We gave her a prescription for Sildenafil but she could not get it through insurance - we discussed Good rx   Just got back from a vacation at [facility]     Current Outpatient Medications  Medication Sig Dispense Refill   albuterol (VENTOLIN HFA) 108 (90 Base) MCG/ACT inhaler      Aloe Vera 25 MG CAPS Take by mouth.     cetirizine (ZYRTEC) 10 MG tablet Take 10 mg by mouth daily.     Cholecalciferol (VITAMIN D3 PO) Take 1,000 Units by mouth daily.     cycloSPORINE (RESTASIS) 0.05 % ophthalmic emulsion 1  into affected eye     diltiazem (CARDIZEM CD) 120 MG 24 hr capsule Take 1 capsule (120 mg total) by mouth daily. 90 capsule 3   DULERA 200-5 MCG/ACT AERO Inhale 2 puffs into the lungs 2 (two) times daily.  5   EPINEPHrine 0.3 mg/0.3 mL IJ SOAJ injection Inject 1 Dose into the muscle as needed.     Estradiol (VAGIFEM) 10 MCG TABS vaginal tablet Place 1 tablet (10 mcg total) vaginally 2 (two) times a week. 24 tablet 3   gabapentin (NEURONTIN) 100 MG capsule TAKE 1 CAPSULE BY MOUTH AT BEDTIME. 90 capsule 0   ketorolac  (TORADOL) 10 MG tablet Take 10 mg by mouth every 6 (six) hours as needed.     montelukast (SINGULAIR) 10 MG tablet Take 10 mg by mouth at bedtime.       Omega-3 Fatty  Acids (OMEGA 3 PO) Take 1 capsule by mouth daily.       OVER THE COUNTER MEDICATION Metagenics Ultra Flora IB Probiotic     OVER THE COUNTER MEDICATION 2 (two) times daily. Cysta Q     rosuvastatin (CRESTOR) 5 MG tablet TAKE 1 TABLET (5 MG TOTAL) BY MOUTH DAILY AT 6 PM. 90 tablet 3   sertraline (ZOLOFT) 50 MG tablet Take 25 mg by mouth daily.     No current facility-administered medications for this visit.     Allergies  Allergen Reactions   Codeine Nausea And Vomiting and Other (See Comments)    Migraine headache   Lipitor [Atorvastatin] Other (See Comments)    Muscle ache, mind foggy,    Azelastine Hcl     Other reaction(s): Other (See Comments)   Latex Hives, Rash and Other (See Comments)    Past Medical History:  Diagnosis Date   Asthma    Atrophic vaginitis    Blood transfusion without reported diagnosis 1975   with vaginal delivery--very anemic   Cystic teratoma    Elevated cholesterol    Endometriosis    Esophageal ulcer    Fibromyalgia    Fundic gland polyps of stomach, benign    GERD (gastroesophageal reflux disease)    History of diastolic dysfunction    History of migraine headaches    Leg edema    left leg   Ocular rosacea    Osteoarthritis    back,hands, knees   Overactive bladder    Raynaud's disease    Status post cystoscopy 09/06/2019   [facility]   Tachycardia    Tubular adenoma of colon 08/2016    Past Surgical History:  Procedure Laterality Date   APPENDECTOMY     BACK SURGERY     BREAST EXCISIONAL BIOPSY Left    BREAST SURGERY  10/09/2013   breast reduction--Dr. Dessie Coma   BUNIONECTOMY     bilateral   CARDIOVASCULAR STRESS TEST  07/02/2008   EF 55-60%   colonscopy     DENTAL SURGERY  2007   ESOPHAGOGASTRODUODENOSCOPY ENDOSCOPY     EYE SURGERY Bilateral 01/2022    FOOT SURGERY     LEFT FOOT   left breast mass removed     OOPHORECTOMY  1982   RSO partial and LSO, uterus   REDUCTION MAMMAPLASTY Bilateral    RSO  2002   partial   ruptured disc surgery  05/2007   TOTAL ABDOMINAL HYSTERECTOMY      Social History   Tobacco Use  Smoking Status Never   Passive exposure: Never  Smokeless Tobacco Never    Social History   Substance and Sexual Activity  Alcohol Use Yes   Alcohol/week: 3.0 standard drinks of alcohol   Types: 3 Standard drinks or equivalent per week   Comment: social     Family History  Problem Relation Age of Onset   Colon polyps Mother    Asthma Mother    Hypertension Mother    Osteopenia Mother    Coronary artery disease Father    Hypertension Father    Heart disease Father    Heart attack Father    Skin cancer Sister        melanoma   Hepatitis C Brother    Skin cancer Brother        melanoma   Stomach cancer Maternal Aunt    Pancreatic cancer Maternal Aunt    Colon polyps Maternal Grandmother    Alzheimer's disease Maternal Grandmother  Lung cancer Maternal Grandfather    Heart attack Maternal Grandfather    Heart failure Paternal Grandfather    Heart attack Paternal Grandfather    Colon cancer Neg Hx    Rectal cancer Neg Hx    Breast cancer Neg Hx    Esophageal cancer Neg Hx     Reviw of Systems:  Reviewed in the HPI.  All other systems are negative.   Physical Exam: Blood pressure 118/82, pulse 69, height '5\' 6"'$  (1.676 m), weight 140 lb (63.5 kg), last menstrual period 11/07/1980, SpO2 98 %.  GEN:  Well nourished, well developed in no acute distress HEENT: Normal NECK: No JVD; No carotid bruits LYMPHATICS: No lymphadenopathy CARDIAC: RRR , no murmurs, rubs, gallops RESPIRATORY:  Clear to auscultation without rales, wheezing or rhonchi  ABDOMEN: Soft, non-tender, non-distended MUSCULOSKELETAL:  No edema; No deformity  SKIN: Warm and dry NEUROLOGIC:  Alert and oriented x 3   ECG:         Assessment / Plan:     1. Essential hypertension:     Blood pressure remains well controlled.  2. Hyperlipidemia: Lipid levels were checked at her primary medical doctor.  Levels look good.  She    3. Supraventricular  Tachycardia: She remains on Cardizem.  Continue current dose.    4.  Raynaud's phenomenon: She is on Cardizem.  She still has episodes of Raynaud's syndrome when the weather gets very cold.  We discussed using sildenafil.  In the past we have given her a prescription for 50 mg tablets.  I have suggested that she try half a tablet a day as needed on cold days to to prevent Raynaud's phenomenon.  We discussed having her use a good Rx coupon. She will call us if that is an issue later this winter.     Mertie Moores, MD  05/30/2022 11:31 AM    [address],  [place], [place]  [address] Pager [phone] Phone: [phone]; Fax: [phone]

## 2022-05-30 ENCOUNTER — Ambulatory Visit (INDEPENDENT_AMBULATORY_CARE_PROVIDER_SITE_OTHER): Payer: Medicare Other | Admitting: Cardiovascular Disease

## 2022-05-30 ENCOUNTER — Encounter: Payer: Self-pay | Admitting: Cardiovascular Disease

## 2022-05-30 VITALS — BP 118/82 | HR 69 | Ht 66.0 in | Wt 140.0 lb

## 2022-05-30 DIAGNOSIS — I7300 Raynaud's syndrome without gangrene: Secondary | ICD-10-CM | POA: Diagnosis not present

## 2022-05-30 DIAGNOSIS — I471 Supraventricular tachycardia: Secondary | ICD-10-CM

## 2022-05-30 NOTE — Patient Instructions (Signed)
Medication Instructions:  Your physician recommends that you continue on your current medications as directed. Please refer to the Current Medication list given to you today.  *If you need a refill on your cardiac medications before your next appointment, please call your pharmacy*  Follow-Up: At [facility], you and your health needs are our priority.  As part of our continuing mission to provide you with exceptional heart care, we have created designated Provider Care Teams.  These Care Teams include your primary Cardiologist (physician) and Advanced Practice Providers (APPs -  Physician Assistants and Nurse Practitioners) who all work together to provide you with the care you need, when you need it.  We recommend signing up for the patient portal called "MyChart".  Sign up information is provided on this After Visit Summary.  MyChart is used to connect with patients for Virtual Visits (Telemedicine).  Patients are able to view lab/test results, encounter notes, upcoming appointments, etc.  Non-urgent messages can be sent to your provider as well.   To learn more about what you can do with MyChart, go to [email or web address].    Your next appointment:   1 year  The format for your next appointment:   In Person  Provider:   Mertie Moores, MD {

## 2022-06-20 DIAGNOSIS — H9201 Otalgia, right ear: Secondary | ICD-10-CM | POA: Diagnosis not present

## 2022-06-20 DIAGNOSIS — H66001 Acute suppurative otitis media without spontaneous rupture of ear drum, right ear: Secondary | ICD-10-CM | POA: Diagnosis not present

## 2022-06-23 DIAGNOSIS — Z23 Encounter for immunization: Secondary | ICD-10-CM | POA: Diagnosis not present

## 2022-06-27 ENCOUNTER — Ambulatory Visit: Payer: Medicare Other | Admitting: Obstetrics and Gynecology

## 2022-07-25 ENCOUNTER — Encounter: Payer: Self-pay | Admitting: Obstetrics and Gynecology

## 2022-07-25 ENCOUNTER — Ambulatory Visit (INDEPENDENT_AMBULATORY_CARE_PROVIDER_SITE_OTHER): Payer: Medicare Other | Admitting: Obstetrics and Gynecology

## 2022-07-25 VITALS — BP 100/64 | HR 84 | Ht 65.5 in | Wt 138.0 lb

## 2022-07-25 DIAGNOSIS — Z01419 Encounter for gynecological examination (general) (routine) without abnormal findings: Secondary | ICD-10-CM | POA: Diagnosis not present

## 2022-07-25 DIAGNOSIS — Z5181 Encounter for therapeutic drug level monitoring: Secondary | ICD-10-CM | POA: Diagnosis not present

## 2022-07-25 DIAGNOSIS — N952 Postmenopausal atrophic vaginitis: Secondary | ICD-10-CM

## 2022-07-25 MED ORDER — ESTRADIOL 10 MCG VA TABS: 10.0000 ug | ORAL_TABLET | VAGINAL | 3 refills | Status: DC

## 2022-07-25 NOTE — Progress Notes (Signed)
66 y.o. G71P1001 Married Caucasian female here for annual breast and pelvic exam.    Patient is followed for vaginal atrophy.  She is using Yuvafem and notices if she misses a dose.  Notices some sensitivity in her left breast.  No lumps per patient.   Doing well with her IC.  Sees Dr. Amalia Hailey once a year.   PCP: Domenick Gong, MD  Patient's last menstrual period was 11/07/1980.           Sexually active: Yes.    The current method of family planning is status post hysterectomy.    Exercising: No.  The patient does not participate in regular exercise at present. Smoker:  no  Health Maintenance: Pap:   03-06-15 Neg:Neg HR HPV History of abnormal Pap:  no MMG:  10-27-21 Neg/BiRads1 Colonoscopy:  08-31-21 Polyp;next 7 years BMD:  08-03-21  Result :Normal TDaP:  PCP Gardasil:   no HIV: Neg in past Hep C: Unsure Screening Labs:  PCP   reports that she has never smoked. She has never been exposed to tobacco smoke. She has never used smokeless tobacco. She reports current alcohol use of about 3.0 standard drinks of alcohol per week. She reports that she does not use drugs.  Past Medical History:  Diagnosis Date   Asthma    Atrophic vaginitis    Blood transfusion without reported diagnosis 1975   with vaginal delivery--very anemic   Cystic teratoma    Elevated cholesterol    Endometriosis    Esophageal ulcer    Fibromyalgia    Fundic gland polyps of stomach, benign    GERD (gastroesophageal reflux disease)    History of diastolic dysfunction    History of migraine headaches    Leg edema    left leg   Ocular rosacea    Osteoarthritis    back,hands, knees   Overactive bladder    Raynaud's disease    Status post cystoscopy 09/06/2019   [facility]   Tachycardia    Tubular adenoma of colon 08/2016    Past Surgical History:  Procedure Laterality Date   APPENDECTOMY     BACK SURGERY     BREAST EXCISIONAL BIOPSY Left    BREAST SURGERY  10/09/2013   breast  reduction--Dr. Dessie Coma   BUNIONECTOMY     bilateral   CARDIOVASCULAR STRESS TEST  07/02/2008   EF 55-60%   CATARACT EXTRACTION BILATERAL W/ ANTERIOR VITRECTOMY     2023   colonscopy     DENTAL SURGERY  2007   ESOPHAGOGASTRODUODENOSCOPY ENDOSCOPY     EYE SURGERY Bilateral 01/2022   FOOT SURGERY     LEFT FOOT   left breast mass removed     OOPHORECTOMY  1982   RSO partial and LSO, uterus   REDUCTION MAMMAPLASTY Bilateral    RSO  2002   partial   ruptured disc surgery  05/2007   TOTAL ABDOMINAL HYSTERECTOMY      Current Outpatient Medications  Medication Sig Dispense Refill   albuterol (VENTOLIN HFA) 108 (90 Base) MCG/ACT inhaler      Aloe Vera 25 MG CAPS Take by mouth.     cetirizine (ZYRTEC) 10 MG tablet Take 10 mg by mouth daily.     Cholecalciferol (VITAMIN D3 PO) Take 1,000 Units by mouth daily.     cycloSPORINE (RESTASIS) 0.05 % ophthalmic emulsion 1  into affected eye     diltiazem (CARDIZEM CD) 120 MG 24 hr capsule Take 1 capsule (120 mg total) by mouth  daily. 90 capsule 3   DULERA 200-5 MCG/ACT AERO Inhale 2 puffs into the lungs 2 (two) times daily.  5   EPINEPHrine 0.3 mg/0.3 mL IJ SOAJ injection Inject 1 Dose into the muscle as needed.     Estradiol (VAGIFEM) 10 MCG TABS vaginal tablet Place 1 tablet (10 mcg total) vaginally 2 (two) times a week. 24 tablet 3   gabapentin (NEURONTIN) 100 MG capsule TAKE 1 CAPSULE BY MOUTH AT BEDTIME. 90 capsule 0   ketorolac (TORADOL) 10 MG tablet Take 10 mg by mouth every 6 (six) hours as needed.     montelukast (SINGULAIR) 10 MG tablet Take 10 mg by mouth at bedtime.       Omega-3 Fatty Acids (OMEGA 3 PO) Take 1 capsule by mouth daily.       OVER THE COUNTER MEDICATION Metagenics Ultra Flora IB Probiotic     OVER THE COUNTER MEDICATION 2 (two) times daily. Cysta Q     rosuvastatin (CRESTOR) 5 MG tablet TAKE 1 TABLET (5 MG TOTAL) BY MOUTH DAILY AT 6 PM. 90 tablet 3   sertraline (ZOLOFT) 50 MG tablet Take 25 mg by mouth daily.      No current facility-administered medications for this visit.    Family History  Problem Relation Age of Onset   Colon polyps Mother    Asthma Mother    Hypertension Mother    Osteopenia Mother    Coronary artery disease Father    Hypertension Father    Heart disease Father    Heart attack Father    Skin cancer Sister        melanoma   Hepatitis C Brother    Skin cancer Brother        melanoma   Stomach cancer Maternal Aunt    Pancreatic cancer Maternal Aunt    Colon polyps Maternal Grandmother    Alzheimer's disease Maternal Grandmother    Lung cancer Maternal Grandfather    Heart attack Maternal Grandfather    Heart failure Paternal Grandfather    Heart attack Paternal Grandfather    Colon cancer Neg Hx    Rectal cancer Neg Hx    Breast cancer Neg Hx    Esophageal cancer Neg Hx     Review of Systems  All other systems reviewed and are negative.   Exam:   BP 100/64   Pulse 84   Ht 5' 5.5" (1.664 m)   Wt 138 lb (62.6 kg)   LMP 11/07/1980   SpO2 (!) 84%   BMI 22.62 kg/m     General appearance: alert, cooperative and appears stated age Head: normocephalic, without obvious abnormality, atraumatic Neck: no adenopathy, supple, symmetrical, trachea midline and thyroid normal to inspection and palpation Lungs: clear to auscultation bilaterally Breasts: consistent with bilateral breast reduction, no masses or tenderness, No nipple retraction or dimpling, No nipple discharge or bleeding, No axillary adenopathy Heart: regular rate and rhythm Abdomen: soft, non-tender; no masses, no organomegaly Extremities: extremities normal, atraumatic, no cyanosis or edema Skin: skin color, texture, turgor normal. No rashes or lesions Lymph nodes: cervical, supraclavicular, and axillary nodes normal. Neurologic: grossly normal  Pelvic: External genitalia:  no lesions              No abnormal inguinal nodes palpated.              Urethra:  normal appearing urethra with no masses,  tenderness or lesions  Bartholins and Skenes: normal                 Vagina: normal appearing vagina with normal color and discharge, no lesions              Cervix: absent              Pap taken: no Bimanual Exam:  Uterus:  absent              Adnexa: no mass, fullness, tenderness              Rectal exam: yes.  Confirms.              Anus:  normal sphincter tone, no lesions  Chaperone was present for exam:  Estill Bamberg, CMA  Assessment:   Well woman visit with gynecologic exam. Status post TAH.  Status post RSO and status post LSO. Vaginal atrophy. Medication monitoring.  On Yuvafem. Status post bilateral breast reduction.  Interstitial cystitis.  Controlled.   Plan: Mammogram screening discussed. Self breast awareness reviewed. Pap and HR HPV not indicated.  Guidelines for Calcium, Vitamin D, regular exercise program including cardiovascular and weight bearing exercise. Refill of Yuvafem 10 mcg pv twice weekly.  We discussed different dosages of vaginal estradiol tablets along with risks and benefits.     Follow up annually and prn.   After visit summary provided.   25 min  total time was spent for this patient encounter, including preparation, face-to-face counseling with the patient, coordination of care, and documentation of the encounter.

## 2022-07-25 NOTE — Patient Instructions (Signed)
EXERCISE AND DIET:  We recommended that you start or continue a regular exercise program for good health. Regular exercise means any activity that makes your heart beat faster and makes you sweat.  We recommend exercising at least 30 minutes per day at least 3 days a week, preferably 4 or 5.  We also recommend a diet low in fat and sugar.  Inactivity, poor dietary choices and obesity can cause diabetes, heart attack, stroke, and kidney damage, among others.    ALCOHOL AND SMOKING:  Women should limit their alcohol intake to no more than 7 drinks/beers/glasses of wine (combined, not each!) per week. Moderation of alcohol intake to this level decreases your risk of breast cancer and liver damage. And of course, no recreational drugs are part of a healthy lifestyle.  And absolutely no smoking or even second hand smoke. Most people know smoking can cause heart and lung diseases, but did you know it also contributes to weakening of your bones? Aging of your skin?  Yellowing of your teeth and nails?  CALCIUM AND VITAMIN D:  Adequate intake of calcium and Vitamin D are recommended.  The recommendations for exact amounts of these supplements seem to change often, but generally speaking 600 mg of calcium (either carbonate or citrate) and 800 units of Vitamin D per day seems prudent. Certain women may benefit from higher intake of Vitamin D.  If you are among these women, your doctor will have told you during your visit.    PAP SMEARS:  Pap smears, to check for cervical cancer or precancers,  have traditionally been done yearly, although recent scientific advances have shown that most women can have pap smears less often.  However, every woman still should have a physical exam from her gynecologist every year. It will include a breast check, inspection of the vulva and vagina to check for abnormal growths or skin changes, a visual exam of the cervix, and then an exam to evaluate the size and shape of the uterus and  ovaries.  And after 66 years of age, a rectal exam is indicated to check for rectal cancers. We will also provide age appropriate advice regarding health maintenance, like when you should have certain vaccines, screening for sexually transmitted diseases, bone density testing, colonoscopy, mammograms, etc.   MAMMOGRAMS:  All women over 40 years old should have a yearly mammogram. Many facilities now offer a "3D" mammogram, which may cost around $50 extra out of pocket. If possible,  we recommend you accept the option to have the 3D mammogram performed.  It both reduces the number of women who will be called back for extra views which then turn out to be normal, and it is better than the routine mammogram at detecting truly abnormal areas.    COLONOSCOPY:  Colonoscopy to screen for colon cancer is recommended for all women at age 50.  We know, you hate the idea of the prep.  We agree, BUT, having colon cancer and not knowing it is worse!!  Colon cancer so often starts as a polyp that can be seen and removed at colonscopy, which can quite literally save your life!  And if your first colonoscopy is normal and you have no family history of colon cancer, most women don't have to have it again for 10 years.  Once every ten years, you can do something that may end up saving your life, right?  We will be happy to help you get it scheduled when you are ready.    Be sure to check your insurance coverage so you understand how much it will cost.  It may be covered as a preventative service at no cost, but you should check your particular policy.    Calcium Content in Foods Calcium is the most abundant mineral in the body. Most of the body's calcium supply is stored in bones and teeth. Calcium helps many parts of the body function normally, including: Blood and blood vessels. Nerves. Hormones. Muscles. Bones and teeth. When your calcium stores are low, you may be at risk for low bone mass, bone loss, and broken bones  (fractures). When you get enough calcium, it helps to support strong bones and teeth throughout your life. Calcium is especially important for: Children during growth spurts. Girls during adolescence. Women who are pregnant or breastfeeding. Women after their menstrual cycle stops (postmenopause). Women whose menstrual cycle has stopped due to anorexia nervosa or regular intense exercise. People who cannot eat or digest dairy products. Vegans. Recommended daily amounts of calcium: Women (ages 19 to 50): 1,000 mg per day. Women (ages 51 and older): 1,200 mg per day. Men (ages 19 to 70): 1,000 mg per day. Men (ages 71 and older): 1,200 mg per day. Women (ages 9 to 18): 1,300 mg per day. Men (ages 9 to 18): 1,300 mg per day. General information Eat foods that are high in calcium. Try to get most of your calcium from food. Some people may benefit from taking calcium supplements. Check with your health care provider or diet and nutrition specialist (dietitian) before starting any calcium supplements. Calcium supplements may interact with certain medicines. Too much calcium may cause other health problems, such as constipation and kidney stones. For the body to absorb calcium, it needs vitamin D. Sources of vitamin D include: Skin exposure to direct sunlight. Foods, such as egg yolks, liver, mushrooms, saltwater fish, and fortified milk. Vitamin D supplements. Check with your health care provider or dietitian before starting any vitamin D supplements. What foods are high in calcium?  Foods that are high in calcium contain more than 100 milligrams per serving. Fruits Fortified orange juice or other fruit juice, 300 mg per 8 oz serving. Vegetables Collard greens, 360 mg per 8 oz serving. Kale, 100 mg per 8 oz serving. Bok choy, 160 mg per 8 oz serving. Grains Fortified ready-to-eat cereals, 100 to 1,000 mg per 8 oz serving. Fortified frozen waffles, 200 mg in 2 waffles. Oatmeal, 140 mg in  1 cup. Meats and other proteins Sardines, canned with bones, 325 mg per 3 oz serving. Salmon, canned with bones, 180 mg per 3 oz serving. Canned shrimp, 125 mg per 3 oz serving. Baked beans, 160 mg per 4 oz serving. Tofu, firm, made with calcium sulfate, 253 mg per 4 oz serving. Dairy Yogurt, plain, low-fat, 310 mg per 6 oz serving. Nonfat milk, 300 mg per 8 oz serving. American cheese, 195 mg per 1 oz serving. Cheddar cheese, 205 mg per 1 oz serving. Cottage cheese 2%, 105 mg per 4 oz serving. Fortified soy, rice, or almond milk, 300 mg per 8 oz serving. Mozzarella, part skim, 210 mg per 1 oz serving. The items listed above may not be a complete list of foods high in calcium. Actual amounts of calcium may be different depending on processing. Contact a dietitian for more information. What foods are lower in calcium? Foods that are lower in calcium contain 50 mg or less per serving. Fruits Apple, about 6 mg. Banana, about 12 mg.   Vegetables Lettuce, 19 mg per 2 oz serving. Tomato, about 11 mg. Grains Rice, 4 mg per 6 oz serving. Boiled potatoes, 14 mg per 8 oz serving. White bread, 6 mg per slice. Meats and other proteins Egg, 27 mg per 2 oz serving. Red meat, 7 mg per 4 oz serving. Chicken, 17 mg per 4 oz serving. Fish, cod, or trout, 20 mg per 4 oz serving. Dairy Cream cheese, regular, 14 mg per 1 Tbsp serving. Brie cheese, 50 mg per 1 oz serving. Parmesan cheese, 70 mg per 1 Tbsp serving. The items listed above may not be a complete list of foods lower in calcium. Actual amounts of calcium may be different depending on processing. Contact a dietitian for more information. Summary Calcium is an important mineral in the body because it affects many functions. Getting enough calcium helps support strong bones and teeth throughout your life. Try to get most of your calcium from food. Calcium supplements may interact with certain medicines. Check with your health care provider  or dietitian before starting any calcium supplements. This information is not intended to replace advice given to you by your health care provider. Make sure you discuss any questions you have with your health care provider. Document Revised: 02/19/2020 Document Reviewed: 02/19/2020 Elsevier Patient Education  2023 [employer] Inc[employer]  

## 2022-07-25 NOTE — Progress Notes (Unsigned)
Office Visit Note  Patient: Lauren Montes             Date of Birth: 03-Nov-1956           MRN: [id number]             PCP: Haywood Pao, MD Referring: Haywood Pao, MD Visit Date: 07/27/2022 Occupation: '@GUAROCC'$ @  Subjective:  Raynaud's phenomenon   History of Present Illness: Ceniya Fowers is a 66 y.o. female with history of Raynaud's phenomenon, DDD, and fibromyalgia.  Patient reports that during the cooler weather months she experienced more frequent and severe episodes of Raynaud's phenomenon.  She states that she tried wearing gloves and socks but continued to have breakthrough symptoms.  Her insurance would not cover sildenafil in the past.  She is not a good candidate for amlodipine due to her blood pressure running on the lower side of normal.  She would like to discuss other treatment options to help better manage her Raynaud's.  She denies any digital ulcerations or signs of gangrene.  She has not had any skin tightness or thickening.  She denies any recent rashes.   She denies any fibromyalgia flares recently.  Her energy level has been stable.  She continues to take gabapentin 100 mg at bedtime which helps her sleep at night.  She requested a gabapentin refill to be sent to the pharmacy.    Activities of Daily Living:  Patient reports morning stiffness for 5-10 minutes.   Patient Denies nocturnal pain.  Difficulty dressing/grooming: Denies Difficulty climbing stairs: Denies Difficulty getting out of chair: Denies Difficulty using hands for taps, buttons, cutlery, and/or writing: Denies  Review of Systems  Constitutional:  Positive for fatigue.  HENT:  Negative for mouth sores and mouth dryness.   Eyes:  Positive for dryness.  Respiratory:  Positive for shortness of breath.   Cardiovascular:  Negative for chest pain and palpitations.  Gastrointestinal:  Negative for blood in stool, constipation and diarrhea.  Endocrine: Negative for  increased urination.  Genitourinary:  Negative for involuntary urination.  Musculoskeletal:  Positive for morning stiffness. Negative for joint pain, gait problem, joint pain, joint swelling, myalgias, muscle weakness, muscle tenderness and myalgias.  Skin:  Negative for color change, rash, hair loss and sensitivity to sunlight.  Allergic/Immunologic: Negative for susceptible to infections.  Neurological:  Negative for dizziness and headaches.  Hematological:  Negative for swollen glands.  Psychiatric/Behavioral:  Negative for depressed mood and sleep disturbance. The patient is not nervous/anxious.     PMFS History:  Patient Active Problem List   Diagnosis Date Noted   Annual physical exam 08/03/2021   Arthritis 07/09/2021   Prolapse of female genital organs 07/10/2019   Chest tightness 02/21/2019   Interstitial cystitis (chronic) without hematuria 01/07/2019   Irritable bowel syndrome 12/30/2016   SVT (supraventricular tachycardia) ([place]) 09/26/2016   Raynaud's disease 06/28/2016   HTN (hypertension) 09/24/2015   Palpitations 03/05/2015   Fibromyalgia 03/05/2015   Chronic diastolic CHF (congestive heart failure), NYHA class 2 ([place]) 06/20/2014   Sinus tachycardia 09/14/2012   Dysphagia 06/29/2012   Ocular rosacea    Chest tightness, discomfort, or pressure 09/09/2011   Hyperlipidemia 05/10/2011   SWELLING OF LIMB 01/20/2011   Moderate recurrent major depression ([place]) 04/19/2010   ABDOMINAL PAIN-EPIGASTRIC 02/03/2010   ABDOMINAL PAIN-MULTIPLE SITES 02/03/2010   Hormone replacement therapy 10/18/2009   Migraine 10/18/2009   Moderate persistent asthma, uncomplicated 03/54/6568   Spondylosis without myelopathy 10/18/2009   CONSTIPATION 09/19/2008  ABDOMINAL PAIN, LEFT LOWER QUADRANT 09/19/2008   COLONIC POLYPS, HYPERPLASTIC, HX OF 09/19/2008    Past Medical History:  Diagnosis Date   Asthma    Atrophic vaginitis    Blood transfusion without reported diagnosis 1975   with  vaginal delivery--very anemic   Cystic teratoma    Elevated cholesterol    Endometriosis    Esophageal ulcer    Fibromyalgia    Fundic gland polyps of stomach, benign    GERD (gastroesophageal reflux disease)    History of diastolic dysfunction    History of migraine headaches    Leg edema    left leg   Ocular rosacea    Osteoarthritis    back,hands, knees   Overactive bladder    Raynaud's disease    Status post cystoscopy 09/06/2019   [facility]   Tachycardia    Tubular adenoma of colon 08/2016    Family History  Problem Relation Age of Onset   Colon polyps Mother    Asthma Mother    Hypertension Mother    Osteopenia Mother    Coronary artery disease Father    Hypertension Father    Heart disease Father    Heart attack Father    Skin cancer Sister        melanoma   Hepatitis C Brother    Skin cancer Brother        melanoma   Stomach cancer Maternal Aunt    Pancreatic cancer Maternal Aunt    Colon polyps Maternal Grandmother    Alzheimer's disease Maternal Grandmother    Lung cancer Maternal Grandfather    Heart attack Maternal Grandfather    Heart failure Paternal Grandfather    Heart attack Paternal Grandfather    Colon cancer Neg Hx    Rectal cancer Neg Hx    Breast cancer Neg Hx    Esophageal cancer Neg Hx    Past Surgical History:  Procedure Laterality Date   APPENDECTOMY     BACK SURGERY     BREAST EXCISIONAL BIOPSY Left    BREAST SURGERY  10/09/2013   breast reduction--Dr. Dessie Coma   BUNIONECTOMY     bilateral   CARDIOVASCULAR STRESS TEST  07/02/2008   EF 55-60%   CATARACT EXTRACTION BILATERAL W/ ANTERIOR VITRECTOMY     2023   colonscopy     DENTAL SURGERY  2007   ESOPHAGOGASTRODUODENOSCOPY ENDOSCOPY     EYE SURGERY Bilateral 01/2022   cataracts   FOOT SURGERY     LEFT FOOT   left breast mass removed     OOPHORECTOMY  1982   RSO partial and LSO, uterus   REDUCTION MAMMAPLASTY Bilateral    RSO  2002   partial   ruptured disc surgery   05/2007   TOTAL ABDOMINAL HYSTERECTOMY     Social History   Social History Narrative   Not on file   Immunization History  Administered Date(s) Administered   Influenza Split 08/17/2011, 08/14/2012, 08/15/2013, 08/06/2014   Influenza, Quadrivalent, Recombinant, Inj, Pf 07/04/2018, 08/08/2019, 07/29/2020   Influenza,inj,Quad PF,6+ Mos 08/15/2013, 08/06/2014, 08/08/2015   Influenza-Unspecified 06/28/2016, 07/07/2017   PFIZER(Purple Top)SARS-COV-2 Vaccination 01/06/2020, 02/08/2020, 08/30/2020   Pneumococcal Conjugate-13 06/28/2016   Pneumococcal Polysaccharide-23 12/01/2009   Td 06/04/2013   Tdap 12/01/2009   Zoster Recombinat (Shingrix) 07/08/2018, 01/03/2019   Zoster, Live 01/03/2019     Objective: Vital Signs: BP 107/73 (BP Location: Left Arm, Patient Position: Sitting, Cuff Size: Normal)   Pulse 79   Resp 14  Ht 5' 5.5" (1.664 m)   Wt 139 lb 12.8 oz (63.4 kg)   LMP 11/07/1980   SpO2 94%   BMI 22.91 kg/m    Physical Exam Vitals and nursing note reviewed.  Constitutional:      Appearance: She is well-developed.  HENT:     Head: Normocephalic and atraumatic.  Eyes:     Conjunctiva/sclera: Conjunctivae normal.  Cardiovascular:     Rate and Rhythm: Normal rate and regular rhythm.     Heart sounds: Normal heart sounds.  Pulmonary:     Effort: Pulmonary effort is normal.     Breath sounds: Normal breath sounds.  Abdominal:     General: Bowel sounds are normal.     Palpations: Abdomen is soft.  Musculoskeletal:     Cervical back: Normal range of motion.  Skin:    General: Skin is warm and dry.     Capillary Refill: Capillary refill takes 2 to 3 seconds.     Comments: No signs of sclerodactyly. No digital ulcerations or signs of gangrene noted.  Neurological:     Mental Status: She is alert and oriented to person, place, and time.  Psychiatric:        Behavior: Behavior normal.      Musculoskeletal Exam: C-spine, thoracic spine, lumbar spine have good range  of motion with no discomfort.  Shoulder joints, elbow joints, wrist joints, MCPs, PIPs, DIPs have good range of motion with no synovitis.  Complete fist formation bilaterally.  Hip joints have good range of motion with no groin pain.  No tenderness over trochanteric bursa bilaterally.  Knee joints have good range of motion with no warmth or effusion.  Ankle joints have good range of motion with no tenderness or joint swelling.  CDAI Exam: CDAI Score: -- Patient Global: --; Provider Global: -- Swollen: --; Tender: -- Joint Exam 07/27/2022   No joint exam has been documented for this visit   There is currently no information documented on the homunculus. Go to the Rheumatology activity and complete the homunculus joint exam.  Investigation: No additional findings.  Imaging: No results found.  Recent Labs: Lab Results  Component Value Date   WBC 7.1 08/15/2016   HGB 13.9 08/15/2016   PLT 248.0 08/15/2016   NA 145 (H) 09/26/2017   K 4.2 09/26/2017   CL 104 09/26/2017   CO2 24 09/26/2017   GLUCOSE 79 09/26/2017   BUN 10 09/26/2017   CREATININE 0.67 09/26/2017   BILITOT 0.5 09/26/2017   ALKPHOS 91 09/26/2017   AST 27 09/26/2017   ALT 23 09/26/2017   PROT 7.1 09/26/2017   ALBUMIN 4.8 09/26/2017   CALCIUM 9.8 09/26/2017   GFRAA 110 09/26/2017    Speciality Comments: No specialty comments available.  Procedures:  No procedures performed Allergies: Codeine, Lipitor [atorvastatin], and Latex   Assessment / Plan:     Visit Diagnoses: Raynaud's disease without gangrene: She continues to experience intermittent symptoms of Raynaud's phenomenon.  Her symptoms are more frequent and more severe with cooler weather temperatures.  In the past her insurance would not cover sildenafil.  She is not a good candidate for amlodipine due to her blood pressure running on the lower side of normal.  Different treatment options were discussed today.  Discussed the use of nitroglycerin ointment  which she can apply topically as needed for symptomatic relief.  Reviewed possible side effects with nitroglycerin ointment.  Patient plans on discussing the use of nitroglycerin ointment with her cardiologist prior  to initiating therapy. A prescription for nitroglycerin ointment was sent to the pharmacy today.  She will notify us if she has any other questions or concerns or cannot tolerate the use of the topical ointment. I also discussed conservative management including avoiding triggers, wearing gloves and thick socks, keeping her core body temperature warm, and avoiding exposure to tobacco smoke. On examination no signs of sclerodactyly were noted.  No digital ulcerations or signs of gangrene.  No cyanosis noted.  Capillary refill 2 to 3 seconds. Discussed signs and symptoms to monitor for closely.  She will notify us if she develops any new or worsening symptoms.  She will follow-up in the office in 6 months or sooner if needed.  Pain in thoracic spine - Resolved. No midline spinal tenderness.   DDD (degenerative disc disease), lumbar - 2 surgeries in the past performed by Dr. Sherwood Gambler.  MRI of the lumbar spine was obtained on 05/18/2013 which revealed postoperative change on the left at L4-L5. She is not experiencing increased discomfort at this time.   Trochanteric bursitis of both hips - Resolved.  No tenderness upon palpation.   Osteoporosis screening - DEXA from 2022 which was normal.   Fibromyalgia: No generalized hyperalgesia or positive tender points on examination today.  She has not had any recent fibromyalgia flares.  Her symptoms have been very manageable.  She continues to take gabapentin 100 mg at bedtime.  A refill was sent to the pharmacy today.  Primary insomnia - She takes gabapentin 100 mg 1 capsule at bedtime.  A refill was sent to the pharmacy today.    Chronic fatigue: Stable.   Other medical conditions are listed as follows:   Ocular rosacea  IC (interstitial  cystitis) - She has been following up with Dr. Amalia Hailey.    History of migraine  History of gastroesophageal reflux (GERD)  History of hypercholesterolemia  History of CHF (congestive heart failure)  History of asthma  Orders: No orders of the defined types were placed in this encounter.  Meds ordered this encounter  Medications   gabapentin (NEURONTIN) 100 MG capsule    Sig: Take 1 capsule (100 mg total) by mouth at bedtime.    Dispense:  90 capsule    Refill:  0   nitroGLYCERIN (NITROGLYN) 2 % ointment    Sig: Apply 0.25 inches topically 4 (four) times daily.    Dispense:  60 g    Refill:  0    Follow-Up Instructions: Return in about 6 months (around 01/25/2023).   Ofilia Neas, PA-C  Note - This record has been created using Dragon software.  Chart creation errors have been sought, but may not always  have been located. Such creation errors do not reflect on  the standard of medical care.

## 2022-07-27 ENCOUNTER — Encounter: Payer: Self-pay | Admitting: Physician Assistant

## 2022-07-27 ENCOUNTER — Ambulatory Visit: Payer: Medicare Other | Attending: Physician Assistant | Admitting: Physician Assistant

## 2022-07-27 VITALS — BP 107/73 | HR 79 | Resp 14 | Ht 65.5 in | Wt 139.8 lb

## 2022-07-27 DIAGNOSIS — M7061 Trochanteric bursitis, right hip: Secondary | ICD-10-CM

## 2022-07-27 DIAGNOSIS — M5136 Other intervertebral disc degeneration, lumbar region: Secondary | ICD-10-CM | POA: Diagnosis not present

## 2022-07-27 DIAGNOSIS — F5101 Primary insomnia: Secondary | ICD-10-CM | POA: Diagnosis not present

## 2022-07-27 DIAGNOSIS — M546 Pain in thoracic spine: Secondary | ICD-10-CM | POA: Diagnosis not present

## 2022-07-27 DIAGNOSIS — I7300 Raynaud's syndrome without gangrene: Secondary | ICD-10-CM

## 2022-07-27 DIAGNOSIS — Z8679 Personal history of other diseases of the circulatory system: Secondary | ICD-10-CM

## 2022-07-27 DIAGNOSIS — Z13820 Encounter for screening for osteoporosis: Secondary | ICD-10-CM | POA: Diagnosis not present

## 2022-07-27 DIAGNOSIS — Z8639 Personal history of other endocrine, nutritional and metabolic disease: Secondary | ICD-10-CM

## 2022-07-27 DIAGNOSIS — Z8669 Personal history of other diseases of the nervous system and sense organs: Secondary | ICD-10-CM | POA: Diagnosis not present

## 2022-07-27 DIAGNOSIS — Z8719 Personal history of other diseases of the digestive system: Secondary | ICD-10-CM | POA: Diagnosis not present

## 2022-07-27 DIAGNOSIS — M797 Fibromyalgia: Secondary | ICD-10-CM

## 2022-07-27 DIAGNOSIS — M7062 Trochanteric bursitis, left hip: Secondary | ICD-10-CM | POA: Diagnosis not present

## 2022-07-27 DIAGNOSIS — L718 Other rosacea: Secondary | ICD-10-CM | POA: Diagnosis not present

## 2022-07-27 DIAGNOSIS — Z8709 Personal history of other diseases of the respiratory system: Secondary | ICD-10-CM | POA: Diagnosis not present

## 2022-07-27 DIAGNOSIS — R5382 Chronic fatigue, unspecified: Secondary | ICD-10-CM | POA: Diagnosis not present

## 2022-07-27 DIAGNOSIS — N3010 Interstitial cystitis (chronic) without hematuria: Secondary | ICD-10-CM

## 2022-07-27 DIAGNOSIS — M51369 Other intervertebral disc degeneration, lumbar region without mention of lumbar back pain or lower extremity pain: Secondary | ICD-10-CM

## 2022-07-27 MED ORDER — GABAPENTIN 100 MG PO CAPS: 100.0000 mg | ORAL_CAPSULE | Freq: Every day | ORAL | 0 refills | Status: DC

## 2022-07-27 MED ORDER — NITROGLYCERIN 2 % TD OINT
TOPICAL_OINTMENT | TRANSDERMAL | 0 refills | Status: DC
Start: 2022-07-27 — End: 2022-08-18

## 2022-07-27 NOTE — Patient Instructions (Signed)
Nitroglycerin ointment

## 2022-07-29 ENCOUNTER — Ambulatory Visit: Payer: Medicare Other | Admitting: Physician Assistant

## 2022-07-29 DIAGNOSIS — E7800 Pure hypercholesterolemia, unspecified: Secondary | ICD-10-CM | POA: Diagnosis not present

## 2022-07-29 DIAGNOSIS — K589 Irritable bowel syndrome without diarrhea: Secondary | ICD-10-CM | POA: Diagnosis not present

## 2022-07-29 DIAGNOSIS — F331 Major depressive disorder, recurrent, moderate: Secondary | ICD-10-CM | POA: Diagnosis not present

## 2022-07-29 DIAGNOSIS — M797 Fibromyalgia: Secondary | ICD-10-CM | POA: Diagnosis not present

## 2022-07-29 DIAGNOSIS — K219 Gastro-esophageal reflux disease without esophagitis: Secondary | ICD-10-CM | POA: Diagnosis not present

## 2022-07-29 DIAGNOSIS — N3010 Interstitial cystitis (chronic) without hematuria: Secondary | ICD-10-CM | POA: Diagnosis not present

## 2022-07-29 DIAGNOSIS — D126 Benign neoplasm of colon, unspecified: Secondary | ICD-10-CM | POA: Diagnosis not present

## 2022-07-29 DIAGNOSIS — M5416 Radiculopathy, lumbar region: Secondary | ICD-10-CM | POA: Diagnosis not present

## 2022-07-29 DIAGNOSIS — J4540 Moderate persistent asthma, uncomplicated: Secondary | ICD-10-CM | POA: Diagnosis not present

## 2022-07-29 DIAGNOSIS — M25511 Pain in right shoulder: Secondary | ICD-10-CM | POA: Diagnosis not present

## 2022-07-29 DIAGNOSIS — G43909 Migraine, unspecified, not intractable, without status migrainosus: Secondary | ICD-10-CM | POA: Diagnosis not present

## 2022-07-29 DIAGNOSIS — K224 Dyskinesia of esophagus: Secondary | ICD-10-CM | POA: Diagnosis not present

## 2022-08-18 ENCOUNTER — Encounter: Payer: Self-pay | Admitting: Internal Medicine

## 2022-08-18 ENCOUNTER — Ambulatory Visit (INDEPENDENT_AMBULATORY_CARE_PROVIDER_SITE_OTHER): Payer: Medicare Other | Admitting: Internal Medicine

## 2022-08-18 VITALS — BP 120/70 | HR 86 | Temp 98.3°F | Ht 65.5 in | Wt 141.2 lb

## 2022-08-18 DIAGNOSIS — J301 Allergic rhinitis due to pollen: Secondary | ICD-10-CM | POA: Diagnosis not present

## 2022-08-18 DIAGNOSIS — J4540 Moderate persistent asthma, uncomplicated: Secondary | ICD-10-CM

## 2022-08-18 DIAGNOSIS — J45909 Unspecified asthma, uncomplicated: Secondary | ICD-10-CM | POA: Diagnosis not present

## 2022-08-18 LAB — POCT EXHALED NITRIC OXIDE: FeNO level (ppb): 19

## 2022-08-18 MED ORDER — AZELASTINE HCL 0.1 % NA SOLN: 1.0000 | Freq: Two times a day (BID) | NASAL | 5 refills | Status: DC

## 2022-08-18 NOTE — Progress Notes (Signed)
Lauren Montes    [id number]    Mar 24, 1956  Primary Care Physician:Tisovec, Fransico Him, MD  Referring Physician: Haywood Pao, [place],  [place] [address] Reason for Consultation: recurrent bronchitis Date of Consultation: 08/18/2022  Chief complaint:   Chief Complaint  Patient presents with   Consult    Hx of asthma, had bronchitis a couple of weeks ago and it is cleared up now.  She is left with a dry cough, increased sob and the sensation of cold air doing down her airway.  2 rounds of prednisone in the past 2 weeks.     HPI: Lauren Montes is a 66 y.o. woman with history of asthma, diagnosed as a young adult.  She presents for new patient evaluation for recurrent bronchitis. Usually has a flare about once a year in the fall, maybe once a year requiring prednisone.   More recently she has had 2 rounds of prednisone in the past 4 weeks.  Notes chest tightness, dry cough Has been taking albuterol 4-5 times/week in the last month.  Had spiriva added to her dulera by PCP which has helped.  Reports poor energy.  Has some residual sinus congestion and drainage. Has a hoarse voice. Feels "cold airways" Does not have a nebulizer machine at home.   Current Regimen: dulera 200 2 puffs twice a day, spiriva 1 puff twice a day. Albuterol prn.  Asthma Triggers: environmental allergens, seasonal changes, fall is worst, rain.  Exacerbations in the last year: 2 requiring prednisone.  History of hospitalization or intubation: never. Allergy Testing:has had allergy testing in the past 3 years - allergic to dust mite GERD: denies Allergic Rhinitis: Takes cetirizine.  ACT:  Asthma Control Test ACT Total Score  08/18/2022  2:53 PM 12   FeNO: 19 ppb  Hasn't had breathing testing in a long time.  Overall breathing has improved but cough and cold airways are persistent.   Social history:  Occupation: retired, used to work in Administrator. Office based job Exposures: lives at home with husband and dog denies allergy.  Smoking history: never smoked. Passive smoke exposure in the work place and socially.   Social History   Occupational History   Occupation: retired    Fish farm manager: UNEMPLOYED  Tobacco Use   Smoking status: Never    Passive exposure: Past   Smokeless tobacco: Never  Vaping Use   Vaping Use: Never used  Substance and Sexual Activity   Alcohol use: Yes    Alcohol/week: 3.0 standard drinks of alcohol    Types: 3 Standard drinks or equivalent per week    Comment: social    Drug use: No   Sexual activity: Yes    Partners: Male    Birth control/protection: Surgical    Comment: Hyst, first intercourse <16, less than 5 partners    Relevant family history:  Family History  Problem Relation Age of Onset   Colon polyps Mother    Asthma Mother    Hypertension Mother    Osteopenia Mother    Coronary artery disease Father    Hypertension Father    Heart disease Father    Heart attack Father    Skin cancer Sister        melanoma   Hepatitis C Brother    Skin cancer Brother        melanoma   Colon polyps Maternal Grandmother    Alzheimer's disease Maternal Grandmother  Lung cancer Maternal Grandfather    Heart attack Maternal Grandfather    Heart failure Paternal Grandfather    Heart attack Paternal Grandfather    Asthma Maternal Aunt    Stomach cancer Maternal Aunt    Pancreatic cancer Maternal Aunt    Asthma Grandson    Colon cancer Neg Hx    Rectal cancer Neg Hx    Breast cancer Neg Hx    Esophageal cancer Neg Hx     Past Medical History:  Diagnosis Date   Asthma    Atrophic vaginitis    Blood transfusion without reported diagnosis 1975   with vaginal delivery--very anemic   Cystic teratoma    Elevated cholesterol    Endometriosis    Esophageal ulcer    Fibromyalgia    Fundic gland polyps of stomach, benign    GERD (gastroesophageal reflux disease)    History of  diastolic dysfunction    History of migraine headaches    Leg edema    left leg   Ocular rosacea    Osteoarthritis    back,hands, knees   Overactive bladder    Raynaud's disease    Status post cystoscopy [facility]/2019   [facility]   Tachycardia    Tubular adenoma of colon 08/2016    Past Surgical History:  Procedure Laterality Date   APPENDECTOMY     BACK SURGERY     BREAST EXCISIONAL BIOPSY Left    BREAST SURGERY  10/09/2013   breast reduction--Dr. Dessie Coma   BUNIONECTOMY     bilateral   CARDIOVASCULAR STRESS TEST  07/02/2008   EF 55-60%   CATARACT EXTRACTION BILATERAL W/ ANTERIOR VITRECTOMY     2023   colonscopy     DENTAL SURGERY  2007   ESOPHAGOGASTRODUODENOSCOPY ENDOSCOPY     EYE SURGERY Bilateral 01/2022   cataracts   FOOT SURGERY     LEFT FOOT   left breast mass removed     OOPHORECTOMY  1982   RSO partial and LSO, uterus   REDUCTION MAMMAPLASTY Bilateral    RSO  2002   partial   ruptured disc surgery  05/2007   TOTAL ABDOMINAL HYSTERECTOMY       Physical Exam: Blood pressure 120/70, pulse 86, temperature 98.3 F (36.8 C), temperature source Oral, height 5' 5.5" (1.664 m), weight 141 lb 3.2 oz (64 kg), last menstrual period 11/07/1980, SpO2 99 %. Gen:      No acute distress, occasional coughing, hoarse voice.  ENT:  mild cobblestoning, no nasal polyps, mucus membranes moist Lungs:    No increased respiratory effort, symmetric chest wall excursion, clear to auscultation bilaterally, no wheezes or crackles CV:         Regular rate and rhythm; no murmurs, rubs, or gallops.  No pedal edema Abd:      + bowel sounds; soft, non-tender; no distension MSK: no acute synovitis of DIP or PIP joints, no mechanics hands.  Skin:      Warm and dry; no rashes Neuro: normal speech, no focal facial asymmetry Psych: alert and oriented x3, normal mood and affect   Data Reviewed/Medical Decision Making:  Independent interpretation of  tests: Imaging:   PFTs: Spirometry obtained 08/18/2022 - normal   Labs:  Lab Results  Component Value Date   WBC 7.1 08/15/2016   HGB 13.9 08/15/2016   HCT 41.4 08/15/2016   MCV 91.1 08/15/2016   PLT 248.0 08/15/2016   Lab Results  Component Value Date   NA 145 (H) 09/26/2017  K 4.2 09/26/2017   CL 104 09/26/2017   CO2 24 09/26/2017     Immunization status:  Immunization History  Administered Date(s) Administered   Influenza Split 08/17/2011, 08/14/2012, 08/15/2013, 08/06/2014   Influenza, Quadrivalent, Recombinant, Inj, Pf 07/04/2018, 08/08/2019, 07/29/2020   Influenza,inj,Quad PF,6+ Mos 08/15/2013, 08/06/2014, 08/08/2015   Influenza-Unspecified 06/28/2016, 07/07/2017   PFIZER(Purple Top)SARS-COV-2 Vaccination 01/06/2020, 02/08/2020, 08/30/2020   Pneumococcal Conjugate-13 06/28/2016   Pneumococcal Polysaccharide-23 12/01/2009   Td 06/04/2013   Tdap 12/01/2009   Zoster Recombinat (Shingrix) 07/08/2018, 01/03/2019   Zoster, Live 01/03/2019     I reviewed prior external note(s) from PCP  I reviewed the result(s) of the labs and imaging as noted above.   I have ordered spirometry feno  Assessment:  Moderate persistent asthma, with recent exacerbation Seasonal allergic rhinitis.   Plan/Recommendations:  Will continue current inhaler therapy with dulera and spiriva at this time.  Start taking astelin nasal spray once daily to help with some nasal drainage as you recover from URI.  dust mite precautions .  Continue singulair, cetirizine Consider obtaining records from allergy testing - [place] allergy?  We discussed disease management and progression at length today regarding asthma.   Return to Care: Return in about 3 months (around 11/18/2022).  Lenice Llamas, MD [facility]  CC: Tisovec, Fransico Him, MD

## 2022-08-18 NOTE — Patient Instructions (Addendum)
Please schedule follow up scheduled with APP in 3 months.  If my schedule is not open yet, we will contact you with a reminder closer to that time. Please call [phone] if you haven't heard from [place] a month before.   Will continue current inhaler therapy with dulera and spiriva at this time.  Start taking astelin nasal spray once daily to help with some nasal drainage as you recover See dust mite precautions below.  Continue singulair, cetirizine  Astelin - 1 spray on each side of your nose twice a day for first week, then 1 spray on each side.   Instructions for use: If you also use a saline nasal spray or rinse, use that first. Position the head with the chin slightly tucked. Use the right hand to spray into the left nostril and the right hand to spray into the left nostril.   Point the bottle away from the septum of your nose (cartilage that divides the two sides of your nose).  Hold the nostril closed on the opposite side from where you will spray Spray once and gently sniff to pull the medicine into the higher parts of your nose.  Don't sniff too hard as the medicine will drain down the back of your throat instead. Repeat with a second spray on the same side if prescribed. Repeat on the other side of your nose.    How Can I Prevent Allergic Reactions to Dust Mites?  Remember, having dust mites doesn't mean your house isn't clean. In most areas of the world, these creatures live in every home, no matter how clean. But, you can reduce their effects. There are many changes you can make to your home to reduce the numbers of these unwanted "guests."  Studies show that more dust mites live in your bedroom than anywhere else in your home. So this is the best place to start.  Cover mattresses and pillows in zippered dust-proof covers. These covers are made of a material with pores too small to let dust mites and their waste product through. They are also called allergen-impermeable. Plastic  or vinyl covers are the least expensive, but some people find them uncomfortable. You can buy other fabric allergen-impermeable covers from many regular bedding stores.  Wash your sheets and blankets weekly in hot water. You have to wash them in water that's at least 130 degrees Fahrenheit or more to kill dust mites.  Get rid of all types of fabric that mites love and that you cannot easily wash regularly in hot water. Avoid wall-to-wall carpeting, curtains, blinds, upholstered furniture and down-filled covers and pillows in the bedroom. Put roll-type shades on your windows instead of curtains.  Have someone without a dust mite allergy clean your bedroom. If this is not possible, wear a filtering mask when dusting or vacuuming. Many drug stores carry these items. Dusting and vacuuming stir up dust. So try to do these chores when you can stay out of the bedroom for a while afterward.  Special CERTIFIED filter vacuum cleaners can help to keep mites and mite waste from getting back into the air. CERTIFIED vacuums are scientifically tested and verified as more suitable for making your home healthier.  Vacuuming is not enough to remove all dust mites and their waste. A large amount of the dust mite population may remain because they live deep inside the stuffing of sofas, chairs, mattresses, pillows and carpeting. Treat other rooms in your house like your bedroom. Here are more tips:  Avoid wall-to-wall carpeting, if possible. If you do use carpeting, mites don't like the type with a short, tight pile as much. Use washable throw rugs over regularly damp-mopped wood, linoleum or tiled floors.  Wash rugs in hot water whenever possible. Cold water isn't as effective. Dry cleaning kills all dust mites and is also good for removing dust mites from living in fabrics. Keep the humidity in your home less than 50 percent. Use a dehumidifier and/or air conditioner to do this.  Use a CERTIFIED filter with your  central furnace and air conditioning unit. This can help trap dust mites from your entire home. Freestanding air cleaners only filter air in a limited area. Avoid devices that treat air with heat, electrostatic ions or ozone.  Websites for allergy bedding covers:  Https://www.asthmaandallergyfriendly.com/  https://www.natlallergy.com/

## 2022-09-11 ENCOUNTER — Encounter: Payer: Self-pay | Admitting: Cardiovascular Disease

## 2022-09-12 ENCOUNTER — Other Ambulatory Visit: Payer: Self-pay | Admitting: *Deleted

## 2022-09-12 DIAGNOSIS — H6992 Unspecified Eustachian tube disorder, left ear: Secondary | ICD-10-CM | POA: Diagnosis not present

## 2022-09-12 DIAGNOSIS — J00 Acute nasopharyngitis [common cold]: Secondary | ICD-10-CM | POA: Diagnosis not present

## 2022-09-12 MED ORDER — DILTIAZEM HCL ER COATED BEADS 120 MG PO CP24: 120.0000 mg | ORAL_CAPSULE | Freq: Every day | ORAL | 3 refills | Status: DC

## 2022-09-20 ENCOUNTER — Other Ambulatory Visit: Payer: Self-pay | Admitting: Obstetrics and Gynecology

## 2022-09-20 DIAGNOSIS — Z1231 Encounter for screening mammogram for malignant neoplasm of breast: Secondary | ICD-10-CM

## 2022-11-17 ENCOUNTER — Ambulatory Visit: Payer: Medicare Other

## 2022-11-21 ENCOUNTER — Other Ambulatory Visit: Payer: Self-pay | Admitting: Physician Assistant

## 2022-11-21 NOTE — Telephone Encounter (Signed)
Next Visit: 02/02/2023  Last Visit: 07/27/2022  Last Fill: 07/27/2022  Dx: Fibromyalgia   Current Dose per office note on 07/27/2022: gabapentin 100 mg at bedtime   Okay to refill Gabapentin?

## 2022-11-22 ENCOUNTER — Encounter: Payer: Self-pay | Admitting: Adult Health

## 2022-11-22 ENCOUNTER — Ambulatory Visit (INDEPENDENT_AMBULATORY_CARE_PROVIDER_SITE_OTHER): Payer: Medicare Other | Admitting: Adult Health

## 2022-11-22 VITALS — BP 100/70 | HR 72 | Temp 97.6°F | Ht 66.0 in | Wt 140.8 lb

## 2022-11-22 DIAGNOSIS — J4540 Moderate persistent asthma, uncomplicated: Secondary | ICD-10-CM | POA: Diagnosis not present

## 2022-11-22 DIAGNOSIS — J309 Allergic rhinitis, unspecified: Secondary | ICD-10-CM | POA: Diagnosis not present

## 2022-11-22 MED ORDER — ALBUTEROL SULFATE HFA 108 (90 BASE) MCG/ACT IN AERS: 2.0000 | INHALATION_SPRAY | Freq: Four times a day (QID) | RESPIRATORY_TRACT | 2 refills | Status: DC | PRN

## 2022-11-22 NOTE — Patient Instructions (Signed)
Continue on Dulera 2 puffs Twice daily , rinse after use  May remain off Spiriva , add if asthma symptoms flare  Asthma action plan as discussed  Zyrtec and Astelin as needed  Albuterol inhaler As needed   Continue on Singulair daily  Covid booster as discussed  Follow up with Dr. Shearon Stalls in 6 months and As needed

## 2022-11-22 NOTE — Addendum Note (Signed)
Addended by: Vanessa Barbara on: 11/22/2022 03:39 PM   Modules accepted: Orders

## 2022-11-22 NOTE — Assessment & Plan Note (Signed)
Under good control.  Continue on current regimen

## 2022-11-22 NOTE — Assessment & Plan Note (Signed)
Under good control.  Continue on current regimen.  Asthma action plan discussed in detail.  Plan  Patient Instructions  Continue on Dulera 2 puffs Twice daily , rinse after use  May remain off Spiriva , add if asthma symptoms flare  Asthma action plan as discussed  Zyrtec and Astelin as needed  Albuterol inhaler As needed   Continue on Singulair daily  Covid booster as discussed  Follow up with Dr. Shearon Stalls in 6 months and As needed

## 2022-11-22 NOTE — Progress Notes (Signed)
$'@Patient'z$  ID: Lauren Montes, female    DOB: May 23, 1956, 67 y.o.   MRN: [id number]  Chief Complaint  Patient presents with   Follow-up    Referring provider: Haywood Pao, MD  HPI: 67 year old female never smoker followed for asthma  TEST/EVENTS :  Spirometry August 18, 2022 normal  11/22/2022 Follow up: [facility]  Patient presents for a 44-monthfollow-up.  Patient has underlying moderate persistent asthma and allergic rhinitis.  Patient was having difficulty last visit with recurrent exacerbations.  At last visit she was recommended continue on Dulera and Spiriva.  Added in Astelin nasal spray and recommended to take Singulair and Zyrtec daily. Says she is doing much better . She stopped Spiriva because she feels better.  Has had no flare in symptoms since being off of it.  She also has weaned off of Zyrtec and Astelin.  Says that she is doing very well.  Very active.  No albuterol use over the last 4 to 5 weeks.  Flu shot is up-to-date.  We discussed the COVID booster. RSV vaccine is up-to-date.  Allergies  Allergen Reactions   Codeine Nausea And Vomiting and Other (See Comments)    Migraine headache   Lipitor [Atorvastatin] Other (See Comments)    Muscle ache, mind foggy,    Latex Hives, Rash and Other (See Comments)    Immunization History  Administered Date(s) Administered   Influenza Split 08/17/2011, 08/14/2012, 08/15/2013, 08/06/2014   Influenza, Quadrivalent, Recombinant, Inj, Pf 07/04/2018, 08/08/2019, 07/29/2020   Influenza,inj,Quad PF,6+ Mos 08/15/2013, 08/06/2014, 08/08/2015, 06/27/2022   Influenza-Unspecified 06/28/2016, 07/07/2017   PFIZER(Purple Top)SARS-COV-2 Vaccination 01/06/2020, 02/08/2020, 08/30/2020   Pneumococcal Conjugate-13 06/28/2016   Pneumococcal Polysaccharide-23 12/01/2009   Td 06/04/2013   Tdap 12/01/2009   Zoster Recombinat (Shingrix) 07/08/2018, 01/03/2019   Zoster, Live 01/03/2019    Past Medical History:  Diagnosis  Date   Asthma    Atrophic vaginitis    Blood transfusion without reported diagnosis 1975   with vaginal delivery--very anemic   Cystic teratoma    Elevated cholesterol    Endometriosis    Esophageal ulcer    Fibromyalgia    Fundic gland polyps of stomach, benign    GERD (gastroesophageal reflux disease)    History of diastolic dysfunction    History of migraine headaches    Leg edema    left leg   Ocular rosacea    Osteoarthritis    back,hands, knees   Overactive bladder    Raynaud's disease    Status post cystoscopy [facility]2019   [facility]   Tachycardia    Tubular adenoma of colon 08/2016    Tobacco History: Social History   Tobacco Use  Smoking Status Never   Passive exposure: Past  Smokeless Tobacco Never   Counseling given: Not Answered   Outpatient Medications Prior to Visit  Medication Sig Dispense Refill   albuterol (VENTOLIN HFA) 108 (90 Base) MCG/ACT inhaler      Aloe Vera 25 MG CAPS Take by mouth.     Cholecalciferol (VITAMIN D3 PO) Take 1,000 Units by mouth daily.     cycloSPORINE (RESTASIS) 0.05 % ophthalmic emulsion 1  into affected eye     diltiazem (CARDIZEM CD) 120 MG 24 hr capsule Take 1 capsule (120 mg total) by mouth daily. 90 capsule 3   DULERA 200-5 MCG/ACT AERO Inhale 2 puffs into the lungs 2 (two) times daily.  5   EPINEPHrine 0.3 mg/0.3 mL IJ SOAJ injection Inject 1 Dose into the  muscle as needed.     Estradiol (VAGIFEM) 10 MCG TABS vaginal tablet Place 1 tablet (10 mcg total) vaginally 2 (two) times a week. 24 tablet 3   fexofenadine (ALLEGRA) 180 MG tablet Take 180 mg by mouth daily. Switches between allegra and zyrtec.     gabapentin (NEURONTIN) 100 MG capsule TAKE 1 CAPSULE BY MOUTH AT BEDTIME. 90 capsule 0   ketorolac (TORADOL) 10 MG tablet Take 10 mg by mouth every 6 (six) hours as needed.     montelukast (SINGULAIR) 10 MG tablet Take 10 mg by mouth at bedtime.       nitroGLYCERIN (NITRO-BID) 2 % ointment Apply topically 4 (four)  times daily.     Omega-3 Fatty Acids (OMEGA 3 PO) Take 1 capsule by mouth daily.       OVER THE COUNTER MEDICATION Metagenics Ultra Flora IB Probiotic     OVER THE COUNTER MEDICATION 2 (two) times daily. Cysta Q     rosuvastatin (CRESTOR) 5 MG tablet TAKE 1 TABLET (5 MG TOTAL) BY MOUTH DAILY AT 6 PM. 90 tablet 3   sertraline (ZOLOFT) 50 MG tablet Take 25 mg by mouth daily.     cetirizine (ZYRTEC) 10 MG tablet Take 10 mg by mouth daily. (Patient not taking: Reported on 11/22/2022)     azelastine (ASTELIN) 0.1 % nasal spray Place 1 spray into both nostrils 2 (two) times daily. Use in each nostril as directed (Patient not taking: Reported on 11/22/2022) 30 mL 5   No facility-administered medications prior to visit.     Review of Systems:   Constitutional:   No  weight loss, night sweats,  Fevers, chills, fatigue, or  lassitude.  HEENT:   No headaches,  Difficulty swallowing,  Tooth/dental problems, or  Sore throat,                No sneezing, itching, ear ache, nasal congestion, post nasal drip,   CV:  No chest pain,  Orthopnea, PND, swelling in lower extremities, anasarca, dizziness, palpitations, syncope.   GI  No heartburn, indigestion, abdominal pain, nausea, vomiting, diarrhea, change in bowel habits, loss of appetite, bloody stools.   Resp: No shortness of breath with exertion or at rest.  No excess mucus, no productive cough,  No non-productive cough,  No coughing up of blood.  No change in color of mucus.  No wheezing.  No chest wall deformity  Skin: no rash or lesions.  GU: no dysuria, change in color of urine, no urgency or frequency.  No flank pain, no hematuria   MS:  No joint pain or swelling.  No decreased range of motion.  No back pain.    Physical Exam  BP 100/70 (BP Location: Left Arm, Patient Position: Sitting, Cuff Size: Normal)   Pulse 72   Temp 97.6 F (36.4 C) (Oral)   Ht '5\' 6"'$  (1.676 m)   Wt 140 lb 12.8 oz (63.9 kg)   LMP 11/07/1980   SpO2 98%   BMI 22.73  kg/m   GEN: A/Ox3; pleasant , NAD, well nourished    HEENT:  [place]/AT,  NOSE-clear, THROAT-clear, no lesions, no postnasal drip or exudate noted.   NECK:  Supple w/ fair ROM; no JVD; normal carotid impulses w/o bruits; no thyromegaly or nodules palpated; no lymphadenopathy.    RESP  Clear  P & A; w/o, wheezes/ rales/ or rhonchi. no accessory muscle use, no dullness to percussion  CARD:  RRR, no m/r/g, no peripheral edema, pulses intact, no  cyanosis or clubbing.  GI:   Soft & nt; nml bowel sounds; no organomegaly or masses detected.   Musco: Warm bil, no deformities or joint swelling noted.   Neuro: alert, no focal deficits noted.    Skin: Warm, no lesions or rashes    Lab Results:      BNP No results found for: "BNP"  ProBNP No results found for: "PROBNP"  Imaging: No results found.        No data to display          No results found for: "NITRICOXIDE"      Assessment & Plan:   Moderate persistent asthma, uncomplicated Under good control.  Continue on current regimen.  Asthma action plan discussed in detail.  Plan  Patient Instructions  Continue on Dulera 2 puffs Twice daily , rinse after use  May remain off Spiriva , add if asthma symptoms flare  Asthma action plan as discussed  Zyrtec and Astelin as needed  Albuterol inhaler As needed   Continue on Singulair daily  Covid booster as discussed  Follow up with Dr. Shearon Stalls in 6 months and As needed       Allergic rhinitis Under good control.  Continue on current regimen     Rexene Edison, NP 11/22/2022

## 2022-11-25 ENCOUNTER — Other Ambulatory Visit (HOSPITAL_COMMUNITY): Payer: Self-pay

## 2022-12-05 DIAGNOSIS — Z23 Encounter for immunization: Secondary | ICD-10-CM | POA: Diagnosis not present

## 2022-12-21 NOTE — Progress Notes (Unsigned)
GYNECOLOGY  VISIT   HPI: 67 y.o.   Married  Caucasian  female   G1P1001 with Patient's last menstrual period was 11/07/1980.   here for  Left breast pain, lower inner breast.  Pt does not feel a lump but it has been hurting for about a month now.  Pain comes and goes.  Patient states she wakes up aware of the pain.  Not taking pain medication.  Feels more sore when she is not using a bra.  No lumps noted by patient.   No breast trauma.  No new work outs.   She uses Yuvafem twice a week.  This helps to control her pelvic floor issues.   Hx bilateral breast reduction and prior breast biopsy.   Not a caffeine user.   Daughter in law has breast cancer.  GYNECOLOGIC HISTORY: Patient's last menstrual period was 11/07/1980. Contraception:  hysterectomy Menopausal hormone therapy:  n/a Last mammogram:   scheduled 01/03/23 ,10/27/21 Breast Density Category B, BI-RADS CATEGORY 1 Neg Last pap smear:   03-06-15 Neg:Neg HR HPV         OB History     Gravida  1   Para  1   Term  1   Preterm      AB      Living  1      SAB      IAB      Ectopic      Multiple      Live Births                 Patient Active Problem List   Diagnosis Date Noted   Allergic rhinitis 11/22/2022   Annual physical exam 08/03/2021   Arthritis 07/09/2021   Prolapse of female genital organs 07/10/2019   Chest tightness 02/21/2019   Interstitial cystitis (chronic) without hematuria 01/07/2019   Irritable bowel syndrome 12/30/2016   SVT (supraventricular tachycardia) 09/26/2016   Raynaud's disease 06/28/2016   HTN (hypertension) 09/24/2015   Palpitations 03/05/2015   Fibromyalgia 03/05/2015   Chronic diastolic CHF (congestive heart failure), NYHA class 2 ([place]) 06/20/2014   Sinus tachycardia 09/14/2012   Dysphagia 06/29/2012   Ocular rosacea    Chest tightness, discomfort, or pressure 09/09/2011   Hyperlipidemia 05/10/2011   SWELLING OF LIMB 01/20/2011   Moderate recurrent major  depression ([place]) 04/19/2010   ABDOMINAL PAIN-EPIGASTRIC 02/03/2010   ABDOMINAL PAIN-MULTIPLE SITES 02/03/2010   Hormone replacement therapy 10/18/2009   Migraine 10/18/2009   Moderate persistent asthma, uncomplicated [id number]   Spondylosis without myelopathy 10/18/2009   CONSTIPATION 09/19/2008   ABDOMINAL PAIN, LEFT LOWER QUADRANT 09/19/2008   COLONIC POLYPS, HYPERPLASTIC, HX OF 09/19/2008    Past Medical History:  Diagnosis Date   Asthma    Atrophic vaginitis    Blood transfusion without reported diagnosis 1975   with vaginal delivery--very anemic   Cystic teratoma    Elevated cholesterol    Endometriosis    Esophageal ulcer    Fibromyalgia    Fundic gland polyps of stomach, benign    GERD (gastroesophageal reflux disease)    History of diastolic dysfunction    History of migraine headaches    Leg edema    left leg   Ocular rosacea    Osteoarthritis    back,hands, knees   Overactive bladder    Raynaud's disease    Status post cystoscopy 09/06/2019   [facility]   Tachycardia    Tubular adenoma of colon 08/2016    Past Surgical History:  Procedure Laterality Date   APPENDECTOMY     BACK SURGERY     BREAST EXCISIONAL BIOPSY Left    BREAST SURGERY  10/09/2013   breast reduction--Dr. Dessie Coma   BUNIONECTOMY     bilateral   CARDIOVASCULAR STRESS TEST  07/02/2008   EF 55-60%   CATARACT EXTRACTION BILATERAL W/ ANTERIOR VITRECTOMY     2023   colonscopy     DENTAL SURGERY  2007   ESOPHAGOGASTRODUODENOSCOPY ENDOSCOPY     EYE SURGERY Bilateral 01/2022   cataracts   FOOT SURGERY     LEFT FOOT   left breast mass removed     OOPHORECTOMY  1982   RSO partial and LSO, uterus   REDUCTION MAMMAPLASTY Bilateral    RSO  2002   partial   ruptured disc surgery  05/2007   TOTAL ABDOMINAL HYSTERECTOMY      Current Outpatient Medications  Medication Sig Dispense Refill   albuterol (VENTOLIN HFA) 108 (90 Base) MCG/ACT inhaler Inhale 2 puffs into the lungs every 6  (six) hours as needed for wheezing or shortness of breath. 18 g 2   Aloe Vera 25 MG CAPS Take by mouth.     cetirizine (ZYRTEC) 10 MG tablet Take 10 mg by mouth daily.     Cholecalciferol (VITAMIN D3 PO) Take 1,000 Units by mouth daily.     cycloSPORINE (RESTASIS) 0.05 % ophthalmic emulsion 1  into affected eye     diltiazem (CARDIZEM CD) 120 MG 24 hr capsule Take 1 capsule (120 mg total) by mouth daily. 90 capsule 3   DULERA 200-5 MCG/ACT AERO Inhale 2 puffs into the lungs 2 (two) times daily.  5   EPINEPHrine 0.3 mg/0.3 mL IJ SOAJ injection Inject 1 Dose into the muscle as needed.     Estradiol (VAGIFEM) 10 MCG TABS vaginal tablet Place 1 tablet (10 mcg total) vaginally 2 (two) times a week. 24 tablet 3   fexofenadine (ALLEGRA) 180 MG tablet Take 180 mg by mouth daily. Switches between allegra and zyrtec.     gabapentin (NEURONTIN) 100 MG capsule TAKE 1 CAPSULE BY MOUTH AT BEDTIME. 90 capsule 0   ketorolac (TORADOL) 10 MG tablet Take 10 mg by mouth every 6 (six) hours as needed.     montelukast (SINGULAIR) 10 MG tablet Take 10 mg by mouth at bedtime.       nitroGLYCERIN (NITRO-BID) 2 % ointment Apply topically 4 (four) times daily.     Omega-3 Fatty Acids (OMEGA 3 PO) Take 1 capsule by mouth daily.       OVER THE COUNTER MEDICATION Metagenics Ultra Flora IB Probiotic     OVER THE COUNTER MEDICATION 2 (two) times daily. Cysta Q     rosuvastatin (CRESTOR) 5 MG tablet TAKE 1 TABLET (5 MG TOTAL) BY MOUTH DAILY AT 6 PM. 90 tablet 3   sertraline (ZOLOFT) 50 MG tablet Take 25 mg by mouth daily.     No current facility-administered medications for this visit.     ALLERGIES: Codeine, Lipitor [atorvastatin], and Latex  Family History  Problem Relation Age of Onset   Colon polyps Mother    Asthma Mother    Hypertension Mother    Osteopenia Mother    Coronary artery disease Father    Hypertension Father    Heart disease Father    Heart attack Father    Skin cancer Sister        melanoma    Hepatitis C Brother    Skin cancer Brother  melanoma   Colon polyps Maternal Grandmother    Alzheimer's disease Maternal Grandmother    Lung cancer Maternal Grandfather    Heart attack Maternal Grandfather    Heart failure Paternal Grandfather    Heart attack Paternal Grandfather    Asthma Maternal Aunt    Stomach cancer Maternal Aunt    Pancreatic cancer Maternal Aunt    Asthma Grandson    Colon cancer Neg Hx    Rectal cancer Neg Hx    Breast cancer Neg Hx    Esophageal cancer Neg Hx     Social History   Socioeconomic History   Marital status: Married    Spouse name: Not on file   Number of children: 1   Years of education: Not on file   Highest education level: Not on file  Occupational History   Occupation: retired    Fish farm manager: UNEMPLOYED  Tobacco Use   Smoking status: Never    Passive exposure: Past   Smokeless tobacco: Never  Vaping Use   Vaping Use: Never used  Substance and Sexual Activity   Alcohol use: Yes    Alcohol/week: 3.0 standard drinks of alcohol    Types: 3 Standard drinks or equivalent per week    Comment: social    Drug use: No   Sexual activity: Yes    Partners: Male    Birth control/protection: Surgical    Comment: Hyst, first intercourse <16, less than 5 partners  Other Topics Concern   Not on file  Social History Narrative   Not on file   Social Determinants of Health   Financial Resource Strain: Not on file  Food Insecurity: Not on file  Transportation Needs: Not on file  Physical Activity: Not on file  Stress: Not on file  Social Connections: Not on file  Intimate Partner Violence: Not on file    Review of Systems  All other systems reviewed and are negative.   PHYSICAL EXAMINATION:    BP 102/70 (BP Location: Right Arm, Patient Position: Sitting, Cuff Size: Normal)   Pulse 76   Ht 5' 6"$  (1.676 m)   Wt 141 lb (64 kg)   LMP 11/07/1980   SpO2 96%   BMI 22.76 kg/m     General appearance: alert, cooperative and  appears stated age  Breasts: consistent with bilateral breast reduction, no masses or tenderness, No nipple retraction or dimpling, No nipple discharge or bleeding, No axillary or supraclavicular adenopathy  Chaperone was present for exam:  Raquel Sarna.  ASSESSMENT  Left breast pain, inner lower quadrant.   PLAN  Proceed with dx bilateral mammogram and left breast US at the [facility].  Ibuprofen, vit E, heat or ice.  Ok to continue [place] for now.  Patient and discussed that if breast biopsy planned, I would consider stopping the Yuvafem at that time until biopsy results would be available.    An After Visit Summary was printed and given to the patient.  22 min  total time was spent for this patient encounter, including preparation, face-to-face counseling with the patient, coordination of care, and documentation of the encounter.

## 2022-12-22 ENCOUNTER — Ambulatory Visit (INDEPENDENT_AMBULATORY_CARE_PROVIDER_SITE_OTHER): Payer: Medicare Other | Admitting: Obstetrics and Gynecology

## 2022-12-22 ENCOUNTER — Encounter: Payer: Self-pay | Admitting: Obstetrics and Gynecology

## 2022-12-22 ENCOUNTER — Telehealth: Payer: Self-pay | Admitting: Obstetrics and Gynecology

## 2022-12-22 VITALS — BP 102/70 | HR 76 | Ht 66.0 in | Wt 141.0 lb

## 2022-12-22 DIAGNOSIS — N644 Mastodynia: Secondary | ICD-10-CM

## 2022-12-22 NOTE — Patient Instructions (Signed)
Breast Tenderness Breast tenderness is a common problem for women of all ages, but may also occur in men. Breast tenderness has many possible causes, including hormone changes, infections, taking certain medicines, and caffeine intake. In women, the pain usually comes and goes with the menstrual cycle, but it can also be constant. Breast tenderness may range from mild discomfort to severe pain. You may have tests, such as a mammogram or an ultrasound, to check for any unusual findings. Having breast tenderness usually does not mean that you have breast cancer. Follow these instructions at home: Managing pain and discomfort  If directed, put ice on the painful area. To do this: Put ice in a plastic bag. Place a towel between your skin and the bag. Leave the ice on for 20 minutes, 2-3 times a day. If your skin turns bright red, remove the ice right away to prevent skin damage. The risk of skin damage is higher if you cannot feel pain, heat, or cold. Wear a supportive bra or chest support: During exercise. While sleeping, if your breasts are very tender. Medicines Take over-the-counter and prescription medicines only as told by your health care provider. If the cause of your pain is an infection, you may be prescribed an antibiotic medicine. If you were prescribed antibiotics, take them as told by your health care provider. Do not stop using the antibiotic even if you start to feel better. Eating and drinking Decrease the amount of caffeine in your diet. Instead, drink more water and choose caffeine-free drinks. Your health care provider may recommend that you lessen the amount of fat in your diet. You can do this by: Limiting fried foods. Cooking foods using methods such as baking, boiling, grilling, and broiling. General instructions  Keep a log of the days and times when your breasts are most tender. Ask your health care provider how to do breast exams at home. This will help you notice if  you have an unusual growth or lump. Keep all follow-up visits. Contact a health care provider if: Any part of your breast is hard, red, and hot to the touch. This may be a sign of infection. You are a woman and have a new or painful lump in your breast that remains after your menstrual period ends. You are not breastfeeding and you have fluid, especially blood or pus, coming out of your nipples. You have a fever. Your pain does not improve or it gets worse. Your pain is interfering with your daily activities. Summary Breast tenderness may range from mild discomfort to severe pain. Breast tenderness has many possible causes, including hormone changes, infections, taking certain medicines, and caffeine intake. It can be treated with ice, wearing a supportive bra or chest support, and medicines. Make changes to your diet as told by your health care provider. This information is not intended to replace advice given to you by your health care provider. Make sure you discuss any questions you have with your health care provider. Document Revised: 01/05/2022 Document Reviewed: 01/05/2022 Elsevier Patient Education  2023 [employer]  

## 2022-12-22 NOTE — Telephone Encounter (Signed)
Please place in mammogram hold. 

## 2022-12-22 NOTE — Telephone Encounter (Signed)
Screening mammo cancelled. Diagnostic Bilateral Mammo and left breast u/s orders placed and scheduled at [facility] for Monday, Feb 26 at 3:30pm/check in 3:15pm.  Spoke with patient and informed her and provided instructions.

## 2022-12-22 NOTE — Telephone Encounter (Signed)
Placed in [facility] hold.

## 2022-12-22 NOTE — Telephone Encounter (Signed)
Please schedule a diagnostic bilateral mammogram and left breast ultrasound for my patient at the [facility].   She has persistent left breast pain in the inner, lower quadrant of the breast.  The patient and I do not feel a lump.   She currently has an appt for a screening mammogram on 01/03/23.

## 2022-12-26 NOTE — Telephone Encounter (Deleted)
.  GCGp

## 2023-01-02 ENCOUNTER — Ambulatory Visit
Admission: RE | Admit: 2023-01-02 | Discharge: 2023-01-02 | Disposition: A | Discharge status: Home / Self Care | Payer: Medicare Other | Source: Ambulatory Visit | Attending: Obstetrics and Gynecology | Admitting: Obstetrics and Gynecology

## 2023-01-02 DIAGNOSIS — N644 Mastodynia: Secondary | ICD-10-CM | POA: Diagnosis not present

## 2023-01-03 ENCOUNTER — Ambulatory Visit: Payer: Medicare Other

## 2023-01-19 NOTE — Progress Notes (Signed)
Office Visit Note  Patient: Lauren Montes             Date of Birth: 25-Oct-1956           MRN: [id number]             PCP: Haywood Pao, MD Referring: Haywood Pao, MD Visit Date: 02/02/2023 Occupation: @GUAROCC @  Subjective:  Intermittent pain   History of Present Illness: Shenea Munson is a 67 y.o. female with history of fibromyalgia and raynaud's disease.  Patient reports that she tried tapering off of gabapentin but started to experience increased pain after about 1 week.  She has resumed gabapentin 100 mg 1 capsule at bedtime.  She continues to find gabapentin to be effective at managing her fibromyalgia and lower back pain.  She has been walking several days a week for 45 to 50 minutes.  She is also been stretching on a regular basis.  Overall her energy level has been stable.  She denies any joint swelling.  She denies any new medical conditions.  She states that her symptoms of Raynaud's phenomenon have been less frequent.  She has a prescription for nitroglycerin ointment which she uses topically as needed.  She denies any increase skin tightness or thickening.  She denies any digital ulcerations.   Activities of Daily Living:  Patient reports morning stiffness for 30-45 minutes.   Patient Denies nocturnal pain.  Difficulty dressing/grooming: Denies Difficulty climbing stairs: Denies Difficulty getting out of chair: Denies Difficulty using hands for taps, buttons, cutlery, and/or writing: Denies  Review of Systems  Constitutional:  Positive for fatigue.  HENT:  Negative for mouth sores and mouth dryness.   Eyes:  Positive for dryness.  Respiratory:  Negative for shortness of breath.   Cardiovascular:  Negative for chest pain and palpitations.  Gastrointestinal:  Positive for constipation and diarrhea. Negative for blood in stool.  Endocrine: Negative for increased urination.  Genitourinary:  Negative for involuntary urination.   Musculoskeletal:  Positive for morning stiffness. Negative for joint pain, gait problem, joint pain, joint swelling, myalgias, muscle weakness, muscle tenderness and myalgias.  Skin:  Negative for color change, rash, hair loss and sensitivity to sunlight.  Allergic/Immunologic: Negative for susceptible to infections.  Neurological:  Negative for dizziness and headaches.  Hematological:  Negative for swollen glands.  Psychiatric/Behavioral:  Negative for depressed mood and sleep disturbance. The patient is not nervous/anxious.     PMFS History:  Patient Active Problem List   Diagnosis Date Noted   Allergic rhinitis 11/22/2022   Annual physical exam 08/03/2021   Arthritis 07/09/2021   Prolapse of female genital organs 07/10/2019   Chest tightness 02/21/2019   Interstitial cystitis (chronic) without hematuria 01/07/2019   Irritable bowel syndrome 12/30/2016   SVT (supraventricular tachycardia) 09/26/2016   Raynaud's disease 06/28/2016   HTN (hypertension) 09/24/2015   Palpitations 03/05/2015   Fibromyalgia 03/05/2015   Chronic diastolic CHF (congestive heart failure), NYHA class 2 ([place]) 06/20/2014   Sinus tachycardia 09/14/2012   Dysphagia 06/29/2012   Ocular rosacea    Chest tightness, discomfort, or pressure 09/09/2011   Hyperlipidemia 05/10/2011   SWELLING OF LIMB 01/20/2011   Moderate recurrent major depression ([place]) 04/19/2010   ABDOMINAL PAIN-EPIGASTRIC 02/03/2010   ABDOMINAL PAIN-MULTIPLE SITES 02/03/2010   Hormone replacement therapy 10/18/2009   Migraine 10/18/2009   Moderate persistent asthma, uncomplicated [id number]   Spondylosis without myelopathy 10/18/2009   CONSTIPATION 09/19/2008   ABDOMINAL PAIN, LEFT LOWER QUADRANT 09/19/2008   COLONIC  POLYPS, HYPERPLASTIC, HX OF 09/19/2008    Past Medical History:  Diagnosis Date   Asthma    Atrophic vaginitis    Blood transfusion without reported diagnosis 1975   with vaginal delivery--very anemic   Cystic teratoma     Elevated cholesterol    Endometriosis    Esophageal ulcer    Fibromyalgia    Fundic gland polyps of stomach, benign    GERD (gastroesophageal reflux disease)    History of diastolic dysfunction    History of migraine headaches    Leg edema    left leg   Ocular rosacea    Osteoarthritis    back,hands, knees   Overactive bladder    Raynaud's disease    Status post cystoscopy 09/06/2019   [facility]   Tachycardia    Tubular adenoma of colon 08/2016    Family History  Problem Relation Age of Onset   Colon polyps Mother    Asthma Mother    Hypertension Mother    Osteopenia Mother    Coronary artery disease Father    Hypertension Father    Heart disease Father    Heart attack Father    Skin cancer Sister        melanoma   Hepatitis C Brother    Skin cancer Brother        melanoma   Colon polyps Maternal Grandmother    Alzheimer's disease Maternal Grandmother    Lung cancer Maternal Grandfather    Heart attack Maternal Grandfather    Heart failure Paternal Grandfather    Heart attack Paternal Grandfather    Asthma Maternal Aunt    Stomach cancer Maternal Aunt    Pancreatic cancer Maternal Aunt    Asthma Grandson    Colon cancer Neg Hx    Rectal cancer Neg Hx    Breast cancer Neg Hx    Esophageal cancer Neg Hx    Past Surgical History:  Procedure Laterality Date   APPENDECTOMY     BACK SURGERY     BREAST EXCISIONAL BIOPSY Left    BREAST SURGERY  10/09/2013   breast reduction--Dr. Dessie Coma   BUNIONECTOMY     bilateral   CARDIOVASCULAR STRESS TEST  07/02/2008   EF 55-60%   CATARACT EXTRACTION BILATERAL W/ ANTERIOR VITRECTOMY     2023   colonscopy     DENTAL SURGERY  2007   ESOPHAGOGASTRODUODENOSCOPY ENDOSCOPY     EYE SURGERY Bilateral 01/2022   cataracts   FOOT SURGERY     LEFT FOOT   left breast mass removed     OOPHORECTOMY  1982   RSO partial and LSO, uterus   REDUCTION MAMMAPLASTY Bilateral    RSO  2002   partial   ruptured disc surgery   05/2007   TOTAL ABDOMINAL HYSTERECTOMY     Social History   Social History Narrative   Not on file   Immunization History  Administered Date(s) Administered   Influenza Split 08/17/2011, 08/14/2012, 08/15/2013, 08/06/2014   Influenza, Quadrivalent, Recombinant, Inj, Pf 07/04/2018, 08/08/2019, 07/29/2020   Influenza,inj,Quad PF,6+ Mos 08/15/2013, 08/06/2014, 08/08/2015, 06/27/2022   Influenza-Unspecified 06/28/2016, 07/07/2017   PFIZER(Purple Top)SARS-COV-2 Vaccination 01/06/2020, 02/08/2020, 08/30/2020   Pneumococcal Conjugate-13 06/28/2016   Pneumococcal Polysaccharide-23 12/01/2009   Td 06/04/2013   Tdap 12/01/2009   Zoster Recombinat (Shingrix) 07/08/2018, 01/03/2019   Zoster, Live 01/03/2019     Objective: Vital Signs: BP 100/69 (BP Location: Left Arm, Patient Position: Sitting, Cuff Size: Normal)   Pulse 78   Resp  14   Ht 5\' 6"  (1.676 m)   Wt 143 lb (64.9 kg)   LMP 11/07/1980   BMI 23.08 kg/m    Physical Exam Vitals and nursing note reviewed.  Constitutional:      Appearance: She is well-developed.  HENT:     Head: Normocephalic and atraumatic.  Eyes:     Conjunctiva/sclera: Conjunctivae normal.  Cardiovascular:     Rate and Rhythm: Normal rate and regular rhythm.     Heart sounds: Normal heart sounds.  Pulmonary:     Effort: Pulmonary effort is normal.     Breath sounds: Normal breath sounds.  Abdominal:     General: Bowel sounds are normal.     Palpations: Abdomen is soft.  Musculoskeletal:     Cervical back: Normal range of motion.  Lymphadenopathy:     Cervical: No cervical adenopathy.  Skin:    General: Skin is warm and dry.     Capillary Refill: Capillary refill takes less than 2 seconds.  Neurological:     Mental Status: She is alert and oriented to person, place, and time.  Psychiatric:        Behavior: Behavior normal.      Musculoskeletal Exam: C-spine, thoracic spine, lumbar spine have good range of motion.  Shoulder joints, elbow  joints, wrist joints, MCPs, PIPs, DIPs have good range of motion with no synovitis.  Complete fist formation bilaterally.  Hip joints have good range of motion with no groin pain.  Knee joints have good range of motion with no warmth or effusion.  Ankle joints have good range of motion with no tenderness or joint swelling.  Tenderness over the right trochanteric bursa noted.  CDAI Exam: CDAI Score: -- Patient Global: --; Provider Global: -- Swollen: --; Tender: -- Joint Exam 02/02/2023   No joint exam has been documented for this visit   There is currently no information documented on the homunculus. Go to the Rheumatology activity and complete the homunculus joint exam.  Investigation: No additional findings.  Imaging: No results found.  Recent Labs: Lab Results  Component Value Date   WBC 7.1 08/15/2016   HGB 13.9 08/15/2016   PLT 248.0 08/15/2016   NA 145 (H) 09/26/2017   K 4.2 09/26/2017   CL 104 09/26/2017   CO2 24 09/26/2017   GLUCOSE 79 09/26/2017   BUN 10 09/26/2017   CREATININE 0.67 09/26/2017   BILITOT 0.5 09/26/2017   ALKPHOS 91 09/26/2017   AST 27 09/26/2017   ALT 23 09/26/2017   PROT 7.1 09/26/2017   ALBUMIN 4.8 09/26/2017   CALCIUM 9.8 09/26/2017   GFRAA 110 09/26/2017    Speciality Comments: No specialty comments available.  Procedures:  No procedures performed Allergies: Codeine, Lipitor [atorvastatin], and Latex   Assessment / Plan:     Visit Diagnoses: Raynaud's disease without gangrene: Not currently active.  No digital ulcerations or signs of gangrene were noted.  No signs of sclerodactyly.  Good capillary refill less than 2 seconds today.  Her symptoms of Raynaud's phenomenon have been less frequent.  She has not needed to use nitroglycerin ointment recently and continues to have a tube on hand to use as needed.  She was advised to notify us if she develops any new or worsening symptoms.  Encouraged the patient to avoid triggers.  She was advised  to notify us if she develops any new or worsening symptoms.  She will follow-up in the office in 6 months or sooner if needed.  Lab work from 01/23/2023 was reviewed today in the office: Total cholesterol 168, triglycerides 94, HDL 51, LDL 98, creatinine 0.8, GFR 71.8, AST and ALT within normal limits, white blood cell count 7.06, hemoglobin 14.1, platelet count 220K.    Fibromyalgia: She has intermittent myalgias and muscle tenderness due to fibromyalgia.  Overall her symptoms have been manageable.  She has been exercising 45 to 50 minutes several days a week and has been stretching on a daily basis for exercise.  She tried tapering off of gabapentin but started to have increased myofascial pain and lower back pain after 1 week.  She has since resumed gabapentin 100 mg 1 capsule at bedtime.  A refill of gabapentin was sent to the pharmacy today.  Discussed the importance of regular exercise and good sleep hygiene.  Trochanteric bursitis of both hips: She has tenderness over the right trochanteric bursa on examination today.  No groin pain currently.  Encouraged patient to perform stretching exercises daily.  Primary insomnia: She is taking gabapentin 100 mg 1 capsule at bedtime.  Chronic fatigue: Chronic, stable.  She has been walking 45 to 50 minutes several days a week for exercise.  She is also been stretching on a regular basis.  Discussed the importance of regular exercise and good sleep hygiene.  DDD (degenerative disc disease), lumbar - - 2 surgeries in the past performed by Dr. Sherwood Gambler.MRI of the lumbar spine was obtained on 05/18/2013 which revealed postoperative change on the left at L4-L5.  She remains on gabapentin 100 mg at bedtime.  Other medical conditions are listed as follows:  IC (interstitial cystitis)  History of migraine  History of gastroesophageal reflux (GERD)  History of hypercholesterolemia  History of CHF (congestive heart failure)  History of  asthma  Osteoporosis screening - DEXA from 2022 which was normal.  Ocular rosacea  Orders: No orders of the defined types were placed in this encounter.  Meds ordered this encounter  Medications   gabapentin (NEURONTIN) 100 MG capsule    Sig: Take 1 capsule (100 mg total) by mouth at bedtime.    Dispense:  90 capsule    Refill:  0      Follow-Up Instructions: Return in about 6 months (around 08/05/2023) for Fibromyalgia.   Ofilia Neas, PA-C  Note - This record has been created using Dragon software.  Chart creation errors have been sought, but may not always  have been located. Such creation errors do not reflect on  the standard of medical care.

## 2023-01-23 DIAGNOSIS — E7800 Pure hypercholesterolemia, unspecified: Secondary | ICD-10-CM | POA: Diagnosis not present

## 2023-01-23 DIAGNOSIS — K219 Gastro-esophageal reflux disease without esophagitis: Secondary | ICD-10-CM | POA: Diagnosis not present

## 2023-01-23 LAB — LAB REPORT - SCANNED: EGFR (Non-African Amer.): 71.8

## 2023-01-30 ENCOUNTER — Ambulatory Visit: Payer: Medicare Other | Admitting: Physician Assistant

## 2023-02-02 ENCOUNTER — Ambulatory Visit: Payer: Medicare Other | Attending: Physician Assistant | Admitting: Physician Assistant

## 2023-02-02 ENCOUNTER — Encounter: Payer: Self-pay | Admitting: Physician Assistant

## 2023-02-02 VITALS — BP 100/69 | HR 78 | Resp 14 | Ht 66.0 in | Wt 143.0 lb

## 2023-02-02 DIAGNOSIS — E7800 Pure hypercholesterolemia, unspecified: Secondary | ICD-10-CM | POA: Diagnosis not present

## 2023-02-02 DIAGNOSIS — F5101 Primary insomnia: Secondary | ICD-10-CM | POA: Diagnosis not present

## 2023-02-02 DIAGNOSIS — M7062 Trochanteric bursitis, left hip: Secondary | ICD-10-CM | POA: Diagnosis not present

## 2023-02-02 DIAGNOSIS — M7061 Trochanteric bursitis, right hip: Secondary | ICD-10-CM | POA: Diagnosis not present

## 2023-02-02 DIAGNOSIS — M25511 Pain in right shoulder: Secondary | ICD-10-CM | POA: Diagnosis not present

## 2023-02-02 DIAGNOSIS — Z13820 Encounter for screening for osteoporosis: Secondary | ICD-10-CM | POA: Insufficient documentation

## 2023-02-02 DIAGNOSIS — Z8679 Personal history of other diseases of the circulatory system: Secondary | ICD-10-CM | POA: Diagnosis not present

## 2023-02-02 DIAGNOSIS — N3010 Interstitial cystitis (chronic) without hematuria: Secondary | ICD-10-CM | POA: Diagnosis not present

## 2023-02-02 DIAGNOSIS — K224 Dyskinesia of esophagus: Secondary | ICD-10-CM | POA: Diagnosis not present

## 2023-02-02 DIAGNOSIS — I7300 Raynaud's syndrome without gangrene: Secondary | ICD-10-CM | POA: Diagnosis not present

## 2023-02-02 DIAGNOSIS — L718 Other rosacea: Secondary | ICD-10-CM | POA: Diagnosis not present

## 2023-02-02 DIAGNOSIS — R5382 Chronic fatigue, unspecified: Secondary | ICD-10-CM | POA: Insufficient documentation

## 2023-02-02 DIAGNOSIS — F331 Major depressive disorder, recurrent, moderate: Secondary | ICD-10-CM | POA: Diagnosis not present

## 2023-02-02 DIAGNOSIS — Z8669 Personal history of other diseases of the nervous system and sense organs: Secondary | ICD-10-CM | POA: Insufficient documentation

## 2023-02-02 DIAGNOSIS — Z0000 Encounter for general adult medical examination without abnormal findings: Secondary | ICD-10-CM | POA: Diagnosis not present

## 2023-02-02 DIAGNOSIS — Z8709 Personal history of other diseases of the respiratory system: Secondary | ICD-10-CM | POA: Diagnosis not present

## 2023-02-02 DIAGNOSIS — M797 Fibromyalgia: Secondary | ICD-10-CM | POA: Insufficient documentation

## 2023-02-02 DIAGNOSIS — M5416 Radiculopathy, lumbar region: Secondary | ICD-10-CM | POA: Diagnosis not present

## 2023-02-02 DIAGNOSIS — Z8639 Personal history of other endocrine, nutritional and metabolic disease: Secondary | ICD-10-CM | POA: Insufficient documentation

## 2023-02-02 DIAGNOSIS — Z8719 Personal history of other diseases of the digestive system: Secondary | ICD-10-CM | POA: Diagnosis not present

## 2023-02-02 DIAGNOSIS — K589 Irritable bowel syndrome without diarrhea: Secondary | ICD-10-CM | POA: Diagnosis not present

## 2023-02-02 DIAGNOSIS — G43909 Migraine, unspecified, not intractable, without status migrainosus: Secondary | ICD-10-CM | POA: Diagnosis not present

## 2023-02-02 DIAGNOSIS — M5136 Other intervertebral disc degeneration, lumbar region: Secondary | ICD-10-CM | POA: Insufficient documentation

## 2023-02-02 DIAGNOSIS — J4540 Moderate persistent asthma, uncomplicated: Secondary | ICD-10-CM | POA: Diagnosis not present

## 2023-02-02 DIAGNOSIS — K219 Gastro-esophageal reflux disease without esophagitis: Secondary | ICD-10-CM | POA: Diagnosis not present

## 2023-02-02 DIAGNOSIS — R82998 Other abnormal findings in urine: Secondary | ICD-10-CM | POA: Diagnosis not present

## 2023-02-02 MED ORDER — GABAPENTIN 100 MG PO CAPS: 100.0000 mg | ORAL_CAPSULE | Freq: Every day | ORAL | 0 refills | Status: DC

## 2023-04-04 DIAGNOSIS — D2262 Melanocytic nevi of left upper limb, including shoulder: Secondary | ICD-10-CM | POA: Diagnosis not present

## 2023-04-04 DIAGNOSIS — L820 Inflamed seborrheic keratosis: Secondary | ICD-10-CM | POA: Diagnosis not present

## 2023-04-04 DIAGNOSIS — D485 Neoplasm of uncertain behavior of skin: Secondary | ICD-10-CM | POA: Diagnosis not present

## 2023-04-04 DIAGNOSIS — D2271 Melanocytic nevi of right lower limb, including hip: Secondary | ICD-10-CM | POA: Diagnosis not present

## 2023-04-04 DIAGNOSIS — D2261 Melanocytic nevi of right upper limb, including shoulder: Secondary | ICD-10-CM | POA: Diagnosis not present

## 2023-04-04 DIAGNOSIS — D225 Melanocytic nevi of trunk: Secondary | ICD-10-CM | POA: Diagnosis not present

## 2023-04-04 DIAGNOSIS — L821 Other seborrheic keratosis: Secondary | ICD-10-CM | POA: Diagnosis not present

## 2023-04-04 DIAGNOSIS — L570 Actinic keratosis: Secondary | ICD-10-CM | POA: Diagnosis not present

## 2023-05-04 DIAGNOSIS — L988 Other specified disorders of the skin and subcutaneous tissue: Secondary | ICD-10-CM | POA: Diagnosis not present

## 2023-05-04 DIAGNOSIS — D485 Neoplasm of uncertain behavior of skin: Secondary | ICD-10-CM | POA: Diagnosis not present

## 2023-05-26 DIAGNOSIS — Z961 Presence of intraocular lens: Secondary | ICD-10-CM | POA: Diagnosis not present

## 2023-05-26 DIAGNOSIS — H52203 Unspecified astigmatism, bilateral: Secondary | ICD-10-CM | POA: Diagnosis not present

## 2023-06-27 DIAGNOSIS — R0981 Nasal congestion: Secondary | ICD-10-CM | POA: Diagnosis not present

## 2023-06-27 DIAGNOSIS — Z1152 Encounter for screening for COVID-19: Secondary | ICD-10-CM | POA: Diagnosis not present

## 2023-06-27 DIAGNOSIS — R051 Acute cough: Secondary | ICD-10-CM | POA: Diagnosis not present

## 2023-06-27 DIAGNOSIS — R638 Other symptoms and signs concerning food and fluid intake: Secondary | ICD-10-CM | POA: Diagnosis not present

## 2023-06-27 DIAGNOSIS — G43909 Migraine, unspecified, not intractable, without status migrainosus: Secondary | ICD-10-CM | POA: Diagnosis not present

## 2023-06-27 DIAGNOSIS — L299 Pruritus, unspecified: Secondary | ICD-10-CM | POA: Diagnosis not present

## 2023-06-27 DIAGNOSIS — J40 Bronchitis, not specified as acute or chronic: Secondary | ICD-10-CM | POA: Diagnosis not present

## 2023-06-27 DIAGNOSIS — J069 Acute upper respiratory infection, unspecified: Secondary | ICD-10-CM | POA: Diagnosis not present

## 2023-06-27 DIAGNOSIS — J4540 Moderate persistent asthma, uncomplicated: Secondary | ICD-10-CM | POA: Diagnosis not present

## 2023-07-05 ENCOUNTER — Ambulatory Visit (INDEPENDENT_AMBULATORY_CARE_PROVIDER_SITE_OTHER): Payer: Medicare Other | Admitting: Internal Medicine

## 2023-07-05 ENCOUNTER — Encounter: Payer: Self-pay | Admitting: Internal Medicine

## 2023-07-05 VITALS — BP 90/62 | HR 75 | Ht 65.5 in | Wt 137.2 lb

## 2023-07-05 DIAGNOSIS — J302 Other seasonal allergic rhinitis: Secondary | ICD-10-CM

## 2023-07-05 DIAGNOSIS — J4541 Moderate persistent asthma with (acute) exacerbation: Secondary | ICD-10-CM

## 2023-07-05 MED ORDER — ALBUTEROL SULFATE HFA 108 (90 BASE) MCG/ACT IN AERS: 2.0000 | INHALATION_SPRAY | Freq: Four times a day (QID) | RESPIRATORY_TRACT | 5 refills | Status: DC | PRN

## 2023-07-05 MED ORDER — DULERA 200-5 MCG/ACT IN AERO: 2.0000 | INHALATION_SPRAY | Freq: Two times a day (BID) | RESPIRATORY_TRACT | 3 refills | Status: DC

## 2023-07-05 NOTE — Progress Notes (Signed)
Lauren Montes    [id number]    February 14, 1956  Primary Care Physician:Tisovec, Adelfa Koh, MD Date of Appointment: 07/05/2023 Established Patient Visit  Chief complaint:   Chief Complaint  Patient presents with   Follow-up    2 weeks URI, which has caused an asthma flare up, terrible cough and sob      HPI: Lauren Montes is a 67 y.o. woman with moderate persistent asthma.   Interval Updates: Here for follow up. Saw TP in Jan 2024 and had weaned off spiriva and was doing well.   Has had a URI for the past two weeks. She saw pcp - got a z pack, steroid shot, and cough medicine.  Now cough and dyspnea worse with exertion.  Using dulera. Using albuterol 3-4 times total.   Current Regimen: dulera 200 2 puffs twice a day, Albuterol prn.  Asthma Triggers: environmental allergens, seasonal changes, fall is worst, rain.  Exacerbations in the last year: 2 requiring prednisone.  History of hospitalization or intubation: never. Allergy Testing:has had allergy testing in the past 3 years - allergic to dust mites GERD: denies Allergic Rhinitis: Takes cetirizine and astelin prn. ACT:  Asthma Control Test ACT Total Score  07/05/2023  3:46 PM 17  11/22/2022  1:29 PM 23  08/18/2022  2:53 PM 12   FeNO:  I have reviewed the patient's family social and past medical history and updated as appropriate.   Past Medical History:  Diagnosis Date   Asthma    Atrophic vaginitis    Blood transfusion without reported diagnosis 1975   with vaginal delivery--very anemic   Cystic teratoma    Elevated cholesterol    Endometriosis    Esophageal ulcer    Fibromyalgia    Fundic gland polyps of stomach, benign    GERD (gastroesophageal reflux disease)    History of diastolic dysfunction    History of migraine headaches    Leg edema    left leg   Ocular rosacea    Osteoarthritis    back,hands, knees   Overactive bladder    Raynaud's disease    Status post cystoscopy  09/06/2019   [facility]   Tachycardia    Tubular adenoma of colon 08/2016    Past Surgical History:  Procedure Laterality Date   APPENDECTOMY     BACK SURGERY     BREAST EXCISIONAL BIOPSY Left    BREAST SURGERY  10/09/2013   breast reduction--Dr. Stephens November   BUNIONECTOMY     bilateral   CARDIOVASCULAR STRESS TEST  07/02/2008   EF 55-60%   CATARACT EXTRACTION BILATERAL W/ ANTERIOR VITRECTOMY     2023   colonscopy     DENTAL SURGERY  2007   ESOPHAGOGASTRODUODENOSCOPY ENDOSCOPY     EYE SURGERY Bilateral 01/2022   cataracts   FOOT SURGERY     LEFT FOOT   left breast mass removed     OOPHORECTOMY  1982   RSO partial and LSO, uterus   REDUCTION MAMMAPLASTY Bilateral    RSO  2002   partial   ruptured disc surgery  05/2007   TOTAL ABDOMINAL HYSTERECTOMY      Family History  Problem Relation Age of Onset   Colon polyps Mother    Asthma Mother    Hypertension Mother    Osteopenia Mother    Coronary artery disease Father    Hypertension Father    Heart disease Father    Heart attack Father  Skin cancer Sister        melanoma   Hepatitis C Brother    Skin cancer Brother        melanoma   Colon polyps Maternal Grandmother    Alzheimer's disease Maternal Grandmother    Lung cancer Maternal Grandfather    Heart attack Maternal Grandfather    Heart failure Paternal Grandfather    Heart attack Paternal Grandfather    Asthma Maternal Aunt    Stomach cancer Maternal Aunt    Pancreatic cancer Maternal Aunt    Asthma Grandson    Colon cancer Neg Hx    Rectal cancer Neg Hx    Breast cancer Neg Hx    Esophageal cancer Neg Hx     Social History   Occupational History   Occupation: retired    Associate Professor: UNEMPLOYED  Tobacco Use   Smoking status: Never    Passive exposure: Past   Smokeless tobacco: Never  Vaping Use   Vaping status: Never Used  Substance and Sexual Activity   Alcohol use: Yes    Alcohol/week: 3.0 standard drinks of alcohol    Types: 3 Standard  drinks or equivalent per week    Comment: social    Drug use: No   Sexual activity: Yes    Partners: Male    Birth control/protection: Surgical    Comment: Hyst, first intercourse <16, less than 5 partners     Physical Exam: Blood pressure 90/62, pulse 75, height 5' 5.5" (1.664 m), weight 137 lb 3.2 oz (62.2 kg), last menstrual period 11/07/1980, SpO2 94%.  Gen:      No acute distress, cough with deep inspiration ENT:  no nasal polyps, mucus membranes moist Lungs:    No increased respiratory effort, symmetric chest wall excursion, clear to auscultation bilaterally, no wheezes or crackles CV:         Regular rate and rhythm; no murmurs, rubs, or gallops.  No pedal edema   Data Reviewed: Imaging: I have personally reviewed the   PFTs:   Labs: Lab Results  Component Value Date   WBC 7.1 08/15/2016   HGB 13.9 08/15/2016   HCT 41.4 08/15/2016   MCV 91.1 08/15/2016   PLT 248.0 08/15/2016   Lab Results  Component Value Date   NA 145 (H) 09/26/2017   K 4.2 09/26/2017   CL 104 09/26/2017   CO2 24 09/26/2017     Immunization status: Immunization History  Administered Date(s) Administered   Influenza Split 08/17/2011, 08/14/2012, 08/15/2013, 08/06/2014   Influenza, Quadrivalent, Recombinant, Inj, Pf 07/04/2018, 08/08/2019, 07/29/2020   Influenza,inj,Quad PF,6+ Mos 08/15/2013, 08/06/2014, 08/08/2015, 06/27/2022   Influenza-Unspecified 06/28/2016, 07/07/2017   PFIZER(Purple Top)SARS-COV-2 Vaccination 01/06/2020, 02/08/2020, 08/30/2020   Pneumococcal Conjugate-13 06/28/2016   Pneumococcal Polysaccharide-23 12/01/2009   Td 06/04/2013   Tdap 12/01/2009   Zoster Recombinant(Shingrix) 07/08/2018, 01/03/2019   Zoster, Live 01/03/2019    External Records Personally Reviewed: pulmonary  Assessment:  Moderate persistent asthma with acute exacerbation Seasonal allergic rhinitis  Plan/Recommendations:  Neb in office today Has already completed steroids and abx course.   Continue your dulera 2 puffs twice daily, gargle after use.   I think you need to take your albuterol a little more frequently over the next few days as you recover from this.   Take anti-histamine and astelin spray diligently during your seasonal allergy months.  Switch allegra to zyrtec/xyzal  Return to Care: Return in about 6 months (around 01/05/2024).   Durel Salts, MD Pulmonary and Critical Care Medicine  [facility] Office:[phone]

## 2023-07-05 NOTE — Patient Instructions (Addendum)
Please schedule follow up scheduled with myself in 6 months.  If my schedule is not open yet, we will contact you with a reminder closer to that time. Please call [phone] if you haven't heard from [place] a month before.   Continue your dulera 2 puffs twice daily, gargle after use.   I think you need to take your albuterol a little more frequently over the next few days as you recover from this.   Take anti-histamine and astelin spray diligently during your seasonal allergy months.   If you have been on allegra for a while I would switch to another anti-histamine such as zyrtec or xyzal - take at night in case they make you sleepy.  Astelin - 1 spray on each side of your nose twice a day for first week, then 1 spray on each side.   Instructions for use: If you also use a saline nasal spray or rinse, use that first. Position the head with the chin slightly tucked. Use the right hand to spray into the left nostril and the right hand to spray into the left nostril.   Point the bottle away from the septum of your nose (cartilage that divides the two sides of your nose).  Hold the nostril closed on the opposite side from where you will spray Spray once and gently sniff to pull the medicine into the higher parts of your nose.  Don't sniff too hard as the medicine will drain down the back of your throat instead. Repeat with a second spray on the same side if prescribed. Repeat on the other side of your nose.

## 2023-07-18 DIAGNOSIS — Z23 Encounter for immunization: Secondary | ICD-10-CM | POA: Diagnosis not present

## 2023-07-20 NOTE — Progress Notes (Signed)
Office Visit Note  Patient: Lauren Montes             Date of Birth: 1956-06-08           MRN: [id number]             PCP: Gaspar Garbe, MD Referring: Gaspar Garbe, MD Visit Date: 08/01/2023 Occupation: @GUAROCC @  Subjective:  Leg cramps  History of Present Illness: Lauren Montes is a 67 y.o. female with history of Raynauds, osteoarthritis, degenerative disc disease and fibromyalgia syndrome.  She states with the cooler temperatures she is having increased Raynaud's symptoms but they are mild.  There is no history of digital ulcers.  She continues to have some generalized pain and discomfort from fibromyalgia.  She has been practicing yoga which has been helpful.  She has not had no recent flares of trochanteric bursitis.  She has been experiencing leg cramps at night which is causing insomnia.  She continues to have some fatigue.  She has intermittent discomfort in her lower back.    Activities of Daily Living:  Patient reports morning stiffness for 1 hour.   Patient Denies nocturnal pain.  Difficulty dressing/grooming: Denies Difficulty climbing stairs: Denies Difficulty getting out of chair: Denies Difficulty using hands for taps, buttons, cutlery, and/or writing: Denies  Review of Systems  Constitutional:  Negative for fatigue.  HENT:  Negative for mouth sores and mouth dryness.   Eyes:  Negative for dryness.  Respiratory:  Negative for shortness of breath.   Cardiovascular:  Negative for chest pain and palpitations.  Gastrointestinal:  Negative for blood in stool, constipation and diarrhea.  Endocrine: Negative for increased urination.  Genitourinary:  Negative for involuntary urination.  Musculoskeletal:  Positive for joint pain, joint pain, myalgias, morning stiffness and myalgias. Negative for gait problem, joint swelling, muscle weakness and muscle tenderness.  Skin:  Positive for color change. Negative for rash, hair loss and sensitivity to  sunlight.  Allergic/Immunologic: Negative for susceptible to infections.  Neurological:  Negative for dizziness and headaches.  Hematological:  Negative for swollen glands.  Psychiatric/Behavioral:  Positive for sleep disturbance. Negative for depressed mood. The patient is not nervous/anxious.     PMFS History:  Patient Active Problem List   Diagnosis Date Noted   Allergic rhinitis 11/22/2022   Annual physical exam 08/03/2021   Arthritis 07/09/2021   Prolapse of female genital organs 07/10/2019   Chest tightness 02/21/2019   Interstitial cystitis (chronic) without hematuria 01/07/2019   Irritable bowel syndrome 12/30/2016   SVT (supraventricular tachycardia) 09/26/2016   Raynaud's disease 06/28/2016   HTN (hypertension) 09/24/2015   Palpitations 03/05/2015   Fibromyalgia 03/05/2015   Chronic diastolic CHF (congestive heart failure), NYHA class 2 (HCC) 06/20/2014   Sinus tachycardia 09/14/2012   Dysphagia 06/29/2012   Ocular rosacea    Chest tightness, discomfort, or pressure 09/09/2011   Hyperlipidemia 05/10/2011   SWELLING OF LIMB 01/20/2011   Moderate recurrent major depression (HCC) 04/19/2010   ABDOMINAL PAIN-EPIGASTRIC 02/03/2010   ABDOMINAL PAIN-MULTIPLE SITES 02/03/2010   Hormone replacement therapy 10/18/2009   Migraine 10/18/2009   Moderate persistent asthma, uncomplicated 10/18/2009   Spondylosis without myelopathy 10/18/2009   CONSTIPATION 09/19/2008   ABDOMINAL PAIN, LEFT LOWER QUADRANT 09/19/2008   COLONIC POLYPS, HYPERPLASTIC, HX OF 09/19/2008    Past Medical History:  Diagnosis Date   Asthma    Atrophic vaginitis    Blood transfusion without reported diagnosis 1975   with vaginal delivery--very anemic   Cystic teratoma  Elevated cholesterol    Endometriosis    Esophageal ulcer    Fibromyalgia    Fundic gland polyps of stomach, benign    GERD (gastroesophageal reflux disease)    History of diastolic dysfunction    History of migraine headaches     Leg edema    left leg   Ocular rosacea    Osteoarthritis    back,hands, knees   Overactive bladder    Raynaud's disease    Status post cystoscopy [facility]/06/2019   [facility]   Tachycardia    Tubular adenoma of colon 08/2016    Family History  Problem Relation Age of Onset   Colon polyps Mother    Asthma Mother    Hypertension Mother    Osteopenia Mother    Coronary artery disease Father    Hypertension Father    Heart disease Father    Heart attack Father    Skin cancer Sister        melanoma   Hepatitis C Brother    Skin cancer Brother        melanoma   Colon polyps Maternal Grandmother    Alzheimer's disease Maternal Grandmother    Lung cancer Maternal Grandfather    Heart attack Maternal Grandfather    Heart failure Paternal Grandfather    Heart attack Paternal Grandfather    Asthma Maternal Aunt    Stomach cancer Maternal Aunt    Pancreatic cancer Maternal Aunt    Asthma Grandson    Colon cancer Neg Hx    Rectal cancer Neg Hx    Breast cancer Neg Hx    Esophageal cancer Neg Hx    Past Surgical History:  Procedure Laterality Date   APPENDECTOMY     BACK SURGERY     BREAST EXCISIONAL BIOPSY Left    BREAST SURGERY  10/09/2013   breast reduction--Dr. Stephens November   BUNIONECTOMY     bilateral   CARDIOVASCULAR STRESS TEST  07/02/2008   EF 55-60%   CATARACT EXTRACTION BILATERAL W/ ANTERIOR VITRECTOMY     2023   colonscopy     DENTAL SURGERY  2007   ESOPHAGOGASTRODUODENOSCOPY ENDOSCOPY     EYE SURGERY Bilateral 01/2022   cataracts   FOOT SURGERY     LEFT FOOT   left breast mass removed     OOPHORECTOMY  1982   RSO partial and LSO, uterus   REDUCTION MAMMAPLASTY Bilateral    RSO  2002   partial   ruptured disc surgery  05/2007   TOTAL ABDOMINAL HYSTERECTOMY     Social History   Social History Narrative   Not on file   Immunization History  Administered Date(s) Administered   Influenza Split 08/17/2011, 08/14/2012, 08/15/2013, 08/06/2014    Influenza, Quadrivalent, Recombinant, Inj, Pf 07/04/2018, 08/08/2019, 07/29/2020   Influenza,inj,Quad PF,6+ Mos 08/15/2013, 08/06/2014, 08/08/2015, 06/27/2022   Influenza-Unspecified 06/28/2016, 07/07/2017   PFIZER(Purple Top)SARS-COV-2 Vaccination 01/06/2020, 02/08/2020, 08/30/2020   Pneumococcal Conjugate-13 06/28/2016   Pneumococcal Polysaccharide-23 12/01/2009   Td 06/04/2013   Tdap 12/01/2009   Zoster Recombinant(Shingrix) 07/08/2018, 01/03/2019   Zoster, Live 01/03/2019     Objective: Vital Signs: BP 110/65 (BP Location: Left Arm, Patient Position: Sitting, Cuff Size: Normal)   Pulse 68   Resp 15   Ht 5' 5.5" (1.664 m)   Wt 134 lb 9.6 oz (61.1 kg)   LMP 11/07/1980   BMI 22.06 kg/m    Physical Exam Vitals and nursing note reviewed.  Constitutional:      Appearance: She  is well-developed.  HENT:     Head: Normocephalic and atraumatic.  Eyes:     Conjunctiva/sclera: Conjunctivae normal.  Cardiovascular:     Rate and Rhythm: Normal rate and regular rhythm.     Heart sounds: Normal heart sounds.  Pulmonary:     Effort: Pulmonary effort is normal.     Breath sounds: Normal breath sounds.  Abdominal:     General: Bowel sounds are normal.     Palpations: Abdomen is soft.  Musculoskeletal:     Cervical back: Normal range of motion.  Lymphadenopathy:     Cervical: No cervical adenopathy.  Skin:    General: Skin is warm and dry.     Capillary Refill: Capillary refill takes less than 2 seconds.  Neurological:     Mental Status: She is alert and oriented to person, place, and time.  Psychiatric:        Behavior: Behavior normal.      Musculoskeletal Exam: Cervical, thoracic and lumbar spine 1 good range of motion.  Shoulder joints, elbow joints, wrist joints, MCPs PIPs and DIPs Juengel range of motion with no synovitis.  Hip joints and knee joints in good range of motion without any warmth swelling or effusion.  There was no tenderness over ankles or MTPs.  CDAI  Exam: CDAI Score: -- Patient Global: --; Provider Global: -- Swollen: --; Tender: -- Joint Exam 08/01/2023   No joint exam has been documented for this visit   There is currently no information documented on the homunculus. Go to the Rheumatology activity and complete the homunculus joint exam.  Investigation: No additional findings.  Imaging: No results found.  Recent Labs: Lab Results  Component Value Date   WBC 7.1 08/15/2016   HGB 13.9 08/15/2016   PLT 248.0 08/15/2016   NA 145 (H) 09/26/2017   K 4.2 09/26/2017   CL 104 09/26/2017   CO2 24 09/26/2017   GLUCOSE 79 09/26/2017   BUN 10 09/26/2017   CREATININE 0.67 09/26/2017   BILITOT 0.5 09/26/2017   ALKPHOS 91 09/26/2017   AST 27 09/26/2017   ALT 23 09/26/2017   PROT 7.1 09/26/2017   ALBUMIN 4.8 09/26/2017   CALCIUM 9.8 09/26/2017   GFRAA 110 09/26/2017    Speciality Comments: No specialty comments available.  Procedures:  No procedures performed Allergies: Codeine, Lipitor [atorvastatin], and Latex   Assessment / Plan:     Visit Diagnoses: Raynaud's disease without gangrene-patient states noticing more Raynauds symptoms with the cooler temperature.  Keeping core temperature warm and warm clothing was discussed.  Patient denies any history of digital ulcers.  She had no sclerodactyly.  She had good capillary refill and no nailbed capillary changes.  Patient was advised to contact us if she develops any new symptoms.  Trochanteric bursitis of both hips-she had no recent episodes of trochanteric bursitis.  She did not have tenderness over the trochanteric region.  DDD (degenerative disc disease), lumbar - 2 surgeries in the past performed by Dr. Newell Coral.MRI of the lumbar spine was obtained on 05/18/2013 which revealed postoperative change on the left at L4-L5.  Osteoporosis screening - DEXA from 2022 which was normal.  She will have repeat DEXA scan in 2027.  Fibromyalgia-she continues to have some generalized  pain and discomfort from fibromyalgia.  She recently has been experiencing some muscle cramps in her lower extremities.  Use of magnesium malate over-the-counter was discussed.  Side effects were also discussed.  Need for regular exercise and stretching was discussed.  Patient states she has been doing yoga which has been helpful.  Primary insomnia - She discontinued gabapentin in June 2024.  She has noticed somewhat increased pain but is manageable.  She has been having insomnia due to muscle cramps.  Stretching exercises and magnesium malate was discussed.  Chronic fatigue-related to fibromyalgia and insomnia.  Other medical problems are listed as follows:  IC (interstitial cystitis)  History of migraine  History of hypercholesterolemia  History of gastroesophageal reflux (GERD)  History of asthma  History of CHF (congestive heart failure)  Ocular rosacea  Orders: No orders of the defined types were placed in this encounter.  No orders of the defined types were placed in this encounter.    Follow-Up Instructions: Return for Osteoarthritis.   Pollyann Savoy, MD  Note - This record has been created using Animal nutritionist.  Chart creation errors have been sought, but may not always  have been located. Such creation errors do not reflect on  the standard of medical care.

## 2023-07-24 ENCOUNTER — Other Ambulatory Visit: Payer: Self-pay | Admitting: Cardiovascular Disease

## 2023-07-27 ENCOUNTER — Ambulatory Visit: Payer: Medicare Other | Admitting: Obstetrics and Gynecology

## 2023-08-01 ENCOUNTER — Ambulatory Visit: Payer: Medicare Other | Attending: Rheumatology | Admitting: Rheumatology

## 2023-08-01 ENCOUNTER — Encounter: Payer: Self-pay | Admitting: Rheumatology

## 2023-08-01 VITALS — BP 110/65 | HR 68 | Resp 15 | Ht 65.5 in | Wt 134.6 lb

## 2023-08-01 DIAGNOSIS — F5101 Primary insomnia: Secondary | ICD-10-CM | POA: Diagnosis not present

## 2023-08-01 DIAGNOSIS — Z8709 Personal history of other diseases of the respiratory system: Secondary | ICD-10-CM | POA: Insufficient documentation

## 2023-08-01 DIAGNOSIS — Z8679 Personal history of other diseases of the circulatory system: Secondary | ICD-10-CM | POA: Insufficient documentation

## 2023-08-01 DIAGNOSIS — M7061 Trochanteric bursitis, right hip: Secondary | ICD-10-CM | POA: Insufficient documentation

## 2023-08-01 DIAGNOSIS — Z8719 Personal history of other diseases of the digestive system: Secondary | ICD-10-CM | POA: Diagnosis not present

## 2023-08-01 DIAGNOSIS — Z8639 Personal history of other endocrine, nutritional and metabolic disease: Secondary | ICD-10-CM | POA: Insufficient documentation

## 2023-08-01 DIAGNOSIS — E7800 Pure hypercholesterolemia, unspecified: Secondary | ICD-10-CM | POA: Diagnosis not present

## 2023-08-01 DIAGNOSIS — I7300 Raynaud's syndrome without gangrene: Secondary | ICD-10-CM | POA: Diagnosis not present

## 2023-08-01 DIAGNOSIS — R5382 Chronic fatigue, unspecified: Secondary | ICD-10-CM | POA: Diagnosis not present

## 2023-08-01 DIAGNOSIS — Z8669 Personal history of other diseases of the nervous system and sense organs: Secondary | ICD-10-CM | POA: Diagnosis not present

## 2023-08-01 DIAGNOSIS — N3010 Interstitial cystitis (chronic) without hematuria: Secondary | ICD-10-CM | POA: Diagnosis not present

## 2023-08-01 DIAGNOSIS — K224 Dyskinesia of esophagus: Secondary | ICD-10-CM | POA: Diagnosis not present

## 2023-08-01 DIAGNOSIS — M5136 Other intervertebral disc degeneration, lumbar region: Secondary | ICD-10-CM | POA: Diagnosis not present

## 2023-08-01 DIAGNOSIS — M5416 Radiculopathy, lumbar region: Secondary | ICD-10-CM | POA: Diagnosis not present

## 2023-08-01 DIAGNOSIS — M7062 Trochanteric bursitis, left hip: Secondary | ICD-10-CM | POA: Diagnosis not present

## 2023-08-01 DIAGNOSIS — M797 Fibromyalgia: Secondary | ICD-10-CM | POA: Diagnosis not present

## 2023-08-01 DIAGNOSIS — Z13820 Encounter for screening for osteoporosis: Secondary | ICD-10-CM | POA: Diagnosis not present

## 2023-08-01 DIAGNOSIS — D126 Benign neoplasm of colon, unspecified: Secondary | ICD-10-CM | POA: Diagnosis not present

## 2023-08-01 DIAGNOSIS — K589 Irritable bowel syndrome without diarrhea: Secondary | ICD-10-CM | POA: Diagnosis not present

## 2023-08-01 DIAGNOSIS — G43909 Migraine, unspecified, not intractable, without status migrainosus: Secondary | ICD-10-CM | POA: Diagnosis not present

## 2023-08-01 DIAGNOSIS — F331 Major depressive disorder, recurrent, moderate: Secondary | ICD-10-CM | POA: Diagnosis not present

## 2023-08-01 DIAGNOSIS — K219 Gastro-esophageal reflux disease without esophagitis: Secondary | ICD-10-CM | POA: Diagnosis not present

## 2023-08-01 DIAGNOSIS — L718 Other rosacea: Secondary | ICD-10-CM | POA: Insufficient documentation

## 2023-08-01 DIAGNOSIS — M25511 Pain in right shoulder: Secondary | ICD-10-CM | POA: Diagnosis not present

## 2023-08-01 DIAGNOSIS — J4540 Moderate persistent asthma, uncomplicated: Secondary | ICD-10-CM | POA: Diagnosis not present

## 2023-08-10 ENCOUNTER — Other Ambulatory Visit: Payer: Self-pay

## 2023-08-10 ENCOUNTER — Other Ambulatory Visit: Payer: Self-pay | Admitting: Obstetrics and Gynecology

## 2023-08-10 DIAGNOSIS — N952 Postmenopausal atrophic vaginitis: Secondary | ICD-10-CM

## 2023-08-10 MED ORDER — ESTRADIOL 10 MCG VA TABS
10.0000 ug | ORAL_TABLET | VAGINAL | 0 refills | Status: DC
Start: 2023-08-10 — End: 2023-09-04

## 2023-08-10 NOTE — Telephone Encounter (Signed)
Medication refill request:yuvafem Last AEX:  07/25/22  Next AEX: 09/13/23  Last MMG (if hormonal medication request): 01/02/23 bi-rads 1 neg  Refill authorized: #[id number] with 0 rf pended for today

## 2023-08-26 ENCOUNTER — Other Ambulatory Visit: Payer: Self-pay | Admitting: Cardiovascular Disease

## 2023-08-27 ENCOUNTER — Other Ambulatory Visit: Payer: Self-pay | Admitting: Cardiovascular Disease

## 2023-08-30 NOTE — Progress Notes (Signed)
67 y.o. G32P1001 Married Caucasian female here for breast and pelvic exam.     Patient is followed for vaginal atrophy and used of Yuvafem.  Asking if she should continue this.    Has IC. Doing well.   Asking about MVI use, specifically calcium/vit D.   Started Zoloft when she was going through menopause.  She continues this and does well on it.  PCP prescribing.   PCP: Gaspar Garbe, MD   Patient's last menstrual period was 11/07/1980.           Sexually active: Yes.    The current method of family planning is status post hysterectomy.    Exercising: Yes.     walking Smoker:  no  OB History  Gravida Para Term Preterm AB Living  1 1 1     1   SAB IAB Ectopic Multiple Live Births               # Outcome Date GA Lbr Len/2nd Weight Sex Type Anes PTL Lv  1 Term              Health Maintenance: Pap:  03-06-15 Neg:Neg HR HPV  History of abnormal Pap:  no MMG: 01/02/23 Breast Density Cat B, BI-RADS CAT 1 neg Colonoscopy:  due in 3 years, 2027.  HM Colonoscopy          Colonoscopy (Every 7 Years) Next due on 08/31/2028    08/31/2021  COLONOSCOPY   Only the first 1 history entries have been loaded, but more history exists.           BMD:  08/03/21  Result  WNL  HIV: n/a Hep C: n/a  Immunization History  Administered Date(s) Administered   Influenza Split 08/17/2011, 08/14/2012, 08/15/2013, 08/06/2014   Influenza, Quadrivalent, Recombinant, Inj, Pf 07/04/2018, 08/08/2019, 07/29/2020   Influenza,inj,Quad PF,6+ Mos 08/15/2013, 08/06/2014, 08/08/2015, 06/27/2022   Influenza-Unspecified 06/28/2016, 07/07/2017   PFIZER(Purple Top)SARS-COV-2 Vaccination 01/06/2020, 02/08/2020, 08/30/2020   Pneumococcal Conjugate-13 06/28/2016   Pneumococcal Polysaccharide-23 12/01/2009   Td 06/04/2013   Tdap 12/01/2009   Zoster Recombinant(Shingrix) 07/08/2018, 01/03/2019   Zoster, Live 01/03/2019      reports that she has never smoked. She has been exposed to tobacco smoke.  She has never used smokeless tobacco. She reports current alcohol use of about 3.0 standard drinks of alcohol per week. She reports that she does not use drugs.  Past Medical History:  Diagnosis Date   Asthma    Atrophic vaginitis    Blood transfusion without reported diagnosis 1975   with vaginal delivery--very anemic   Cystic teratoma    Elevated cholesterol    Endometriosis    Esophageal ulcer    Fibromyalgia    Fundic gland polyps of stomach, benign    GERD (gastroesophageal reflux disease)    History of diastolic dysfunction    History of migraine headaches    Leg edema    left leg   Ocular rosacea    Osteoarthritis    back,hands, knees   Overactive bladder    Raynaud's disease    Status post cystoscopy 09/06/2019   [facility]   Tachycardia    Tubular adenoma of colon 08/2016    Past Surgical History:  Procedure Laterality Date   APPENDECTOMY     BACK SURGERY     BREAST EXCISIONAL BIOPSY Left    BREAST SURGERY  10/09/2013   breast reduction--Dr. Stephens November   BUNIONECTOMY     bilateral   CARDIOVASCULAR STRESS  TEST  07/02/2008   EF 55-60%   CATARACT EXTRACTION BILATERAL W/ ANTERIOR VITRECTOMY     2023   colonscopy     DENTAL SURGERY  2007   ESOPHAGOGASTRODUODENOSCOPY ENDOSCOPY     EYE SURGERY Bilateral 01/2022   cataracts   FOOT SURGERY     LEFT FOOT   left breast mass removed     OOPHORECTOMY  1982   RSO partial and LSO, uterus   REDUCTION MAMMAPLASTY Bilateral    RSO  2002   partial   ruptured disc surgery  05/2007   TOTAL ABDOMINAL HYSTERECTOMY      Current Outpatient Medications  Medication Sig Dispense Refill   albuterol (VENTOLIN HFA) 108 (90 Base) MCG/ACT inhaler Inhale 2 puffs into the lungs every 6 (six) hours as needed for wheezing or shortness of breath. 18 g 5   Aloe Vera 25 MG CAPS Take by mouth.     cetirizine (ZYRTEC) 10 MG tablet Take 10 mg by mouth daily.     Cholecalciferol (VITAMIN D3 PO) Take 1,000 Units by mouth daily.      cycloSPORINE (RESTASIS) 0.05 % ophthalmic emulsion 1  into affected eye     diltiazem (CARDIZEM CD) 120 MG 24 hr capsule Take 1 capsule (120 mg total) by mouth daily. 90 capsule 0   DULERA 200-5 MCG/ACT AERO Inhale 2 puffs into the lungs 2 (two) times daily. 3 each 3   EPINEPHrine 0.3 mg/0.3 mL IJ SOAJ injection Inject 1 Dose into the muscle as needed.     Estradiol (VAGIFEM) 10 MCG TABS vaginal tablet Place 1 tablet (10 mcg total) vaginally 2 (two) times a week. 24 tablet 0   ketorolac (TORADOL) 10 MG tablet Take 10 mg by mouth every 6 (six) hours as needed.     montelukast (SINGULAIR) 10 MG tablet Take 10 mg by mouth at bedtime.       Omega-3 Fatty Acids (OMEGA 3 PO) Take 1 capsule by mouth daily.       OVER THE COUNTER MEDICATION Metagenics Ultra Flora IB Probiotic     OVER THE COUNTER MEDICATION 2 (two) times daily. Cysta Q     rosuvastatin (CRESTOR) 5 MG tablet TAKE 1 TABLET (5 MG TOTAL) BY MOUTH DAILY AT 6 PM. 90 tablet 1   sertraline (ZOLOFT) 50 MG tablet Take 25 mg by mouth daily.     No current facility-administered medications for this visit.    Family History  Problem Relation Age of Onset   Colon polyps Mother    Asthma Mother    Hypertension Mother    Osteopenia Mother    Coronary artery disease Father    Hypertension Father    Heart disease Father    Heart attack Father    Skin cancer Sister        melanoma   Hepatitis C Brother    Skin cancer Brother        melanoma   Colon polyps Maternal Grandmother    Alzheimer's disease Maternal Grandmother    Lung cancer Maternal Grandfather    Heart attack Maternal Grandfather    Heart failure Paternal Grandfather    Heart attack Paternal Grandfather    Asthma Maternal Aunt    Stomach cancer Maternal Aunt    Pancreatic cancer Maternal Aunt    Asthma Grandson    Colon cancer Neg Hx    Rectal cancer Neg Hx    Breast cancer Neg Hx    Esophageal cancer Neg Hx  Review of Systems  All other systems reviewed and are  negative.   Exam:   BP 130/84 (BP Location: Right Arm, Patient Position: Sitting, Cuff Size: Normal)   Pulse 69   Ht 5' 5.5" (1.664 m)   Wt 137 lb (62.1 kg)   LMP 11/07/1980   SpO2 99%   BMI 22.45 kg/m     General appearance: alert, cooperative and appears stated age Head: normocephalic, without obvious abnormality, atraumatic Neck: no adenopathy, supple, symmetrical, trachea midline and thyroid normal to inspection and palpation Lungs: clear to auscultation bilaterally Breasts: consistent with reduction, no masses or tenderness, No nipple retraction or dimpling, No nipple discharge or bleeding, No axillary adenopathy Heart: regular rate and rhythm Abdomen: soft, non-tender; no masses, no organomegaly Extremities: extremities normal, atraumatic, no cyanosis or edema Skin: skin color, texture, turgor normal. No rashes or lesions Lymph nodes: cervical, supraclavicular, and axillary nodes normal. Neurologic: grossly normal  Pelvic: External genitalia:  no lesions              No abnormal inguinal nodes palpated.              Urethra:  normal appearing urethra with no masses, tenderness or lesions              Bartholins and Skenes: normal                 Vagina: normal appearing vagina with normal color and discharge, no lesions              Cervix: absent              Pap taken: no Bimanual Exam:  Uterus:  absent              Adnexa: no mass, fullness, tenderness              Rectal exam: yes.  Confirms.              Anus:  normal sphincter tone, no lesions  Chaperone was present for exam:  Warren Lacy, CMA   Assessment: Encounter for breast and pelvic exam.  Status post TAH.  Status post RSO and status post LSO. Vaginal atrophy. Medication monitoring.  On Yuvafem and doing well. Status post bilateral breast reduction.  Interstitial cystitis.  Controlled.  On Zoloft, started to treat menopause transition.   Plan: Mammogram screening discussed. Self breast awareness  reviewed. Pap and HR HPV not indicated.  Guidelines for Calcium, Vitamin D, regular exercise program including cardiovascular and weight bearing exercise. Calcium rich foods and calcium/vit D supplements discussed.  Refill of Vagifem for one year.  We discussed the benefits to treat urogenital atrophy.  Potential effect on breast cancer also reviewed.  Does blood work and vaccines with PCP.  Follow up yearly and prn.   20 min  total time was spent for this patient encounter, including preparation, face-to-face counseling with the patient, coordination of care, and documentation of the encounter in addition to doing the breast and pelvic exam.

## 2023-09-04 ENCOUNTER — Other Ambulatory Visit: Payer: Self-pay

## 2023-09-04 DIAGNOSIS — N952 Postmenopausal atrophic vaginitis: Secondary | ICD-10-CM

## 2023-09-04 MED ORDER — ESTRADIOL 10 MCG VA TABS
10.0000 ug | ORAL_TABLET | VAGINAL | 0 refills | Status: DC
Start: 2023-09-04 — End: 2023-09-13

## 2023-09-04 NOTE — Telephone Encounter (Signed)
Pt requested 90 day prescription of Yuvafem Next AEX 09/13/23 Last AEX 07/2022 Last mammo-01/02/23  #24 tablet, 0 rf

## 2023-09-06 ENCOUNTER — Telehealth: Payer: Self-pay | Admitting: Cardiovascular Disease

## 2023-09-06 MED ORDER — DILTIAZEM HCL ER COATED BEADS 120 MG PO CP24: 120.0000 mg | ORAL_CAPSULE | Freq: Every day | ORAL | 0 refills | Status: DC

## 2023-09-06 NOTE — Telephone Encounter (Signed)
*  STAT* If patient is at the pharmacy, call can be transferred to refill team.   1. Which medications need to be refilled? (please list name of each medication and dose if known)   diltiazem (CARDIZEM CD) 120 MG 24 hr capsule   2. Would you like to learn more about the convenience, safety, & potential cost savings by using the [facility]?   3. Are you open to using the [facility] (Type [facility]. ).  4. Which pharmacy/location (including street and city if local pharmacy) is medication to be sent to?  [id number] - [place], [place] - [address]   5. Do they need a 30 day or 90 day supply?   90 day  Patient stated she has 5-6 days left of this medication.  Patient has appointment scheduled on 12/04/23.

## 2023-09-06 NOTE — Telephone Encounter (Signed)
Pt's medication was sent to pt's pharmacy as requested. Confirmation received.  °

## 2023-09-12 ENCOUNTER — Other Ambulatory Visit: Payer: Self-pay | Admitting: Cardiovascular Disease

## 2023-09-13 ENCOUNTER — Ambulatory Visit (INDEPENDENT_AMBULATORY_CARE_PROVIDER_SITE_OTHER): Payer: Medicare Other | Admitting: Obstetrics and Gynecology

## 2023-09-13 ENCOUNTER — Encounter: Payer: Self-pay | Admitting: Obstetrics and Gynecology

## 2023-09-13 VITALS — BP 130/84 | HR 69 | Ht 65.5 in | Wt 137.0 lb

## 2023-09-13 DIAGNOSIS — Z01419 Encounter for gynecological examination (general) (routine) without abnormal findings: Secondary | ICD-10-CM

## 2023-09-13 DIAGNOSIS — N952 Postmenopausal atrophic vaginitis: Secondary | ICD-10-CM

## 2023-09-13 DIAGNOSIS — Z719 Counseling, unspecified: Secondary | ICD-10-CM | POA: Diagnosis not present

## 2023-09-13 DIAGNOSIS — Z5181 Encounter for therapeutic drug level monitoring: Secondary | ICD-10-CM | POA: Diagnosis not present

## 2023-09-13 MED ORDER — ESTRADIOL 10 MCG VA TABS
10.0000 ug | ORAL_TABLET | VAGINAL | 3 refills | Status: DC
Start: 2023-09-14 — End: 2024-09-16

## 2023-09-13 NOTE — Patient Instructions (Addendum)
EXERCISE AND DIET:  We recommended that you start or continue a regular exercise program for good health. Regular exercise means any activity that makes your heart beat faster and makes you sweat.  We recommend exercising at least 30 minutes per day at least 3 days a week, preferably 4 or 5.  We also recommend a diet low in fat and sugar.  Inactivity, poor dietary choices and obesity can cause diabetes, heart attack, stroke, and kidney damage, among others.    ALCOHOL AND SMOKING:  Women should limit their alcohol intake to no more than 7 drinks/beers/glasses of wine (combined, not each!) per week. Moderation of alcohol intake to this level decreases your risk of breast cancer and liver damage. And of course, no recreational drugs are part of a healthy lifestyle.  And absolutely no smoking or even second hand smoke. Most people know smoking can cause heart and lung diseases, but did you know it also contributes to weakening of your bones? Aging of your skin?  Yellowing of your teeth and nails?  CALCIUM AND VITAMIN D:  Adequate intake of calcium and Vitamin D are recommended.  The recommendations for exact amounts of these supplements seem to change often, but generally speaking 600 mg of calcium (either carbonate or citrate) and 800 units of Vitamin D per day seems prudent. Certain women may benefit from higher intake of Vitamin D.  If you are among these women, your doctor will have told you during your visit.    PAP SMEARS:  Pap smears, to check for cervical cancer or precancers,  have traditionally been done yearly, although recent scientific advances have shown that most women can have pap smears less often.  However, every woman still should have a physical exam from her gynecologist every year. It will include a breast check, inspection of the vulva and vagina to check for abnormal growths or skin changes, a visual exam of the cervix, and then an exam to evaluate the size and shape of the uterus and  ovaries.  And after 67 years of age, a rectal exam is indicated to check for rectal cancers. We will also provide age appropriate advice regarding health maintenance, like when you should have certain vaccines, screening for sexually transmitted diseases, bone density testing, colonoscopy, mammograms, etc.   MAMMOGRAMS:  All women over 40 years old should have a yearly mammogram. Many facilities now offer a "3D" mammogram, which may cost around $50 extra out of pocket. If possible,  we recommend you accept the option to have the 3D mammogram performed.  It both reduces the number of women who will be called back for extra views which then turn out to be normal, and it is better than the routine mammogram at detecting truly abnormal areas.    COLONOSCOPY:  Colonoscopy to screen for colon cancer is recommended for all women at age 50.  We know, you hate the idea of the prep.  We agree, BUT, having colon cancer and not knowing it is worse!!  Colon cancer so often starts as a polyp that can be seen and removed at colonscopy, which can quite literally save your life!  And if your first colonoscopy is normal and you have no family history of colon cancer, most women don't have to have it again for 10 years.  Once every ten years, you can do something that may end up saving your life, right?  We will be happy to help you get it scheduled when you are ready.    Be sure to check your insurance coverage so you understand how much it will cost.  It may be covered as a preventative service at no cost, but you should check your particular policy.    Calcium Content in Foods Calcium is the most abundant mineral in the body. Most of the body's calcium supply is stored in bones and teeth. Calcium helps many parts of the body function normally, including: Blood and blood vessels. Nerves. Hormones. Muscles. Bones and teeth. When your calcium stores are low, you may be at risk for low bone mass, bone loss, and broken bones  (fractures). When you get enough calcium, it helps to support strong bones and teeth throughout your life. Calcium is especially important for: Children during growth spurts. Girls during adolescence. Women who are pregnant or breastfeeding. Women after their menstrual cycle stops (postmenopause). Women whose menstrual cycle has stopped due to anorexia nervosa or regular intense exercise. People who cannot eat or digest dairy products. Vegans. Recommended daily amounts of calcium: Women (ages 19 to 50): 1,000 mg per day. Women (ages 51 and older): 1,200 mg per day. Men (ages 19 to 70): 1,000 mg per day. Men (ages 71 and older): 1,200 mg per day. Women (ages 9 to 18): 1,300 mg per day. Men (ages 9 to 18): 1,300 mg per day. General information Eat foods that are high in calcium. Try to get most of your calcium from food. Some people may benefit from taking calcium supplements. Check with your health care provider or diet and nutrition specialist (dietitian) before starting any calcium supplements. Calcium supplements may interact with certain medicines. Too much calcium may cause other health problems, such as constipation and kidney stones. For the body to absorb calcium, it needs vitamin D. Sources of vitamin D include: Skin exposure to direct sunlight. Foods, such as egg yolks, liver, mushrooms, saltwater fish, and fortified milk. Vitamin D supplements. Check with your health care provider or dietitian before starting any vitamin D supplements. What foods are high in calcium?  Foods that are high in calcium contain more than 100 milligrams per serving. Fruits Fortified orange juice or other fruit juice, 300 mg per 8 oz serving. Vegetables Collard greens, 360 mg per 8 oz serving. Kale, 100 mg per 8 oz serving. Bok choy, 160 mg per 8 oz serving. Grains Fortified ready-to-eat cereals, 100 to 1,000 mg per 8 oz serving. Fortified frozen waffles, 200 mg in 2 waffles. Oatmeal, 140 mg in  1 cup. Meats and other proteins Sardines, canned with bones, 325 mg per 3 oz serving. Salmon, canned with bones, 180 mg per 3 oz serving. Canned shrimp, 125 mg per 3 oz serving. Baked beans, 160 mg per 4 oz serving. Tofu, firm, made with calcium sulfate, 253 mg per 4 oz serving. Dairy Yogurt, plain, low-fat, 310 mg per 6 oz serving. Nonfat milk, 300 mg per 8 oz serving. American cheese, 195 mg per 1 oz serving. Cheddar cheese, 205 mg per 1 oz serving. Cottage cheese 2%, 105 mg per 4 oz serving. Fortified soy, rice, or almond milk, 300 mg per 8 oz serving. Mozzarella, part skim, 210 mg per 1 oz serving. The items listed above may not be a complete list of foods high in calcium. Actual amounts of calcium may be different depending on processing. Contact a dietitian for more information. What foods are lower in calcium? Foods that are lower in calcium contain 50 mg or less per serving. Fruits Apple, about 6 mg. Banana, about 12 mg.   Vegetables Lettuce, 19 mg per 2 oz serving. Tomato, about 11 mg. Grains Rice, 4 mg per 6 oz serving. Boiled potatoes, 14 mg per 8 oz serving. White bread, 6 mg per slice. Meats and other proteins Egg, 27 mg per 2 oz serving. Red meat, 7 mg per 4 oz serving. Chicken, 17 mg per 4 oz serving. Fish, cod, or trout, 20 mg per 4 oz serving. Dairy Cream cheese, regular, 14 mg per 1 Tbsp serving. Brie cheese, 50 mg per 1 oz serving. Parmesan cheese, 70 mg per 1 Tbsp serving. The items listed above may not be a complete list of foods lower in calcium. Actual amounts of calcium may be different depending on processing. Contact a dietitian for more information. Summary Calcium is an important mineral in the body because it affects many functions. Getting enough calcium helps support strong bones and teeth throughout your life. Try to get most of your calcium from food. Calcium supplements may interact with certain medicines. Check with your health care provider  or dietitian before starting any calcium supplements. This information is not intended to replace advice given to you by your health care provider. Make sure you discuss any questions you have with your health care provider. Document Revised: 02/19/2020 Document Reviewed: 02/19/2020 Elsevier Patient Education  2024 Elsevier Inc.  

## 2023-10-09 DIAGNOSIS — J4540 Moderate persistent asthma, uncomplicated: Secondary | ICD-10-CM | POA: Diagnosis not present

## 2023-10-09 DIAGNOSIS — J3489 Other specified disorders of nose and nasal sinuses: Secondary | ICD-10-CM | POA: Diagnosis not present

## 2023-10-09 DIAGNOSIS — H6591 Unspecified nonsuppurative otitis media, right ear: Secondary | ICD-10-CM | POA: Diagnosis not present

## 2023-10-16 DIAGNOSIS — R3982 Chronic bladder pain: Secondary | ICD-10-CM | POA: Diagnosis not present

## 2023-10-16 DIAGNOSIS — T3695XA Adverse effect of unspecified systemic antibiotic, initial encounter: Secondary | ICD-10-CM | POA: Diagnosis not present

## 2023-10-16 DIAGNOSIS — B379 Candidiasis, unspecified: Secondary | ICD-10-CM | POA: Diagnosis not present

## 2023-10-16 DIAGNOSIS — M6289 Other specified disorders of muscle: Secondary | ICD-10-CM | POA: Diagnosis not present

## 2023-10-16 DIAGNOSIS — N3010 Interstitial cystitis (chronic) without hematuria: Secondary | ICD-10-CM | POA: Diagnosis not present

## 2023-10-16 DIAGNOSIS — Z87440 Personal history of urinary (tract) infections: Secondary | ICD-10-CM | POA: Diagnosis not present

## 2023-12-03 ENCOUNTER — Other Ambulatory Visit: Payer: Self-pay | Admitting: Cardiovascular Disease

## 2023-12-03 ENCOUNTER — Encounter: Payer: Self-pay | Admitting: Cardiovascular Disease

## 2023-12-03 NOTE — Progress Notes (Unsigned)
Lauren Montes Date of Birth  1956-06-04 [facility] [place] [address]     [address] [place], [place]  [address] [phone]  Fax  [phone]  1. Hyperlipidemia 2. Sinus tachycardia, SVT 3. Chronic asthma     Lauren Montes is a middle-age female the history of mild diastolic dysfunction as well as leg edema. She also has a history of hypercholesterolemia.   Has a history of hyperlipidemia. We have had her on Crestor the past which she tolerated very well. Her insurance and he refused to pay for to try her on atorvastatin. This caused her to have lots of muscle aches as well as memory loss. We discontinued the atorvastatin at that time.  She has been recording her heart rate and blood pressure readings. Her heart rate is frequently above 100. He also has many readings in the 80-90 range.  Has bruising particularly on her hands and on her fingers. [name] never had any easy bleeding. She did not have any bleeding  complications. during her pregnancies or deliveries.  Feb. 4, 2014: Lauren Montes is doing well.  Her HR and BP have been well controlled.   June 17, 2013:  Lauren Montes is doing well.  She has started waking over the past several weeks.    June 21, 2014:  Lauren Montes is having some issues with DOE.    Perhaps slight chest pressure with exertion.  No pain. .  Sleeps on 2 pillows occasionally at night - not every night.  [name] very careful with her salt intake. She and her husband thinks most of her meals of scratch. She denies any leg swelling   Nov. 17, 2015:  Lauren Montes is doing well.  She is seen for chronic dyspnea. Her echo was normal.  myoview showed no ischemia Occasional heart racing.  Not severe.  Does not take any meds.  Resolves after several minutes. She is walking for exercise - 30-45 minutes 3 times a week.  Also do some strength training  Nov. 17, 2016: Overall doing ok Lots of stress - aging parents  Lots of muscle cramps in  legs and feet.   Feb. 27, 2017:  Lauren Montes is seen back  Cholesterol is back up - she was not able to takes atorvastatin or Pravachol because of severe muscle aches and cramping  And mental foggyness. She has  Tried  Crestor in the past and it caused muscle cramps as well . [name] now on Zetia.  May 18 , 2017:  Lauren Montes is seen today for a work in visit.  She has been having lots of swelling in her feet and hands ( feet > hands)  We gave her Lasix and Kdur to take for several days.  She took the Lasix and Kdur for 2 days .   Has not needed it sense  has had some fatigue  Has not really eaten any more salt.  This has been occuring off and of for the past 6 weeks.  Is having palpitations at night  She has had increased anxiety.  Still able to do all of her usual activities without dyspnea.   She recently stopped taking her low dose estrogen ( 2-3 weeks ago )   Nov. 20, 2017:     No issues,  No SVT Takes 1/2 zoloft a day - anxiety may have been plaing a part  Has really cut down on the cholesterol in her diet  Getting exercise. Has lost 20 lbs.  Would like to  decrease the crestor to every other day instead of daily .  Nov. 20, 2018  Doing great No significant  Tachycardia Has greatly reduced her meat intake.   Has a seafood allergy Eats some chicken.    Lots of beans .     Nov. 20, 2019  Lauren Montes is seen today  Doing very well  BP and HR are well controlled.  Has occasional episodes of tachycardia , esp at night or if she gets overexerted. Has not been exercising  Seems to occur daily  Always when she lies down at night   Some of her symptoms sound like volume depletion She drinks water all day long but then says that she has heart pounding frequently through the day.  She is having issues with her IBS .  Has not been eating much protein   November 11, 2019: Lauren Montes is seen today for follow-up of her hypertension and supraventricular tachycardia. Has been diagnosed with  interstitial cyctitis .  Is on lots of pain meds.  Still able to exercise failry well Trying to walk regularly   Jan. 3, 2022 Lauren Montes is seen today for follow up of her HTN and SVT. No cardiac issues. Has some leg cramps  - is walking 3 times a week , 45 minutes each time .   Jan. 24, 2023 Lauren Montes is seen for follow up of her HTN and SVT Her Raynaulds symptoms are worse this year   By is on the low side  Suggested liquid iv in her water   She has been having leg aches/muscle cramps.  She thinks it may be related to rosuvastatin.  We will stop the rosuvastatin for a couple of weeks and see if this helps these leg aches/cramps.  If she does end up stopping the rosuvastatin we will get a coronary calcium score to assess her long-term risk of coronary artery disease  May 30, 2022 Lauren Montes is seen for follow up of her HTN and SVT Had Raynaulds symptoms this past winter. We gave her a prescription for Sildenafil but she could not get it through insurance - we discussed Good rx   Just got back from a vacation at [place]   Jan. 27, 2025 Lauren Montes is seen for follow up of her HTN, SVT, Raynaulds syndrome  Rare palpitations, might last 30 seconds   Having more shortness of breath ,  climbing stairs  With general movement  She has occasional episodes of chest pressure. She walks at a brisk pace for 50 minutes  without significant dyspnea  She thinks the chest pressure  She only tolerates low dose rosuvastatin 5 mg just 5 times a week Has also tried atorvastatin - lots of muscle ache.     She wante to discuss coronary calcium score  + strong family hx of CAD  Father was in his 16s when he was diagnosed  Grandfather passed away in his 15s CAD     Current Outpatient Medications  Medication Sig Dispense Refill   albuterol (VENTOLIN HFA) 108 (90 Base) MCG/ACT inhaler Inhale 2 puffs into the lungs every 6 (six) hours as needed for wheezing or shortness of breath. 18 g 5   Aloe Vera  25 MG CAPS Take by mouth.     cetirizine (ZYRTEC) 10 MG tablet Take 10 mg by mouth daily.     cycloSPORINE (RESTASIS) 0.05 % ophthalmic emulsion 1  into affected eye     diltiazem (CARDIZEM CD) 120 MG 24 hr capsule Take 1 capsule (  120 mg total) by mouth daily. 90 capsule 0   DULERA 200-5 MCG/ACT AERO Inhale 2 puffs into the lungs 2 (two) times daily. 3 each 3   EPINEPHrine 0.3 mg/0.3 mL IJ SOAJ injection Inject 1 Dose into the muscle as needed.     Estradiol (VAGIFEM) 10 MCG TABS vaginal tablet Place 1 tablet (10 mcg total) vaginally 2 (two) times a week. 24 tablet 3   ketorolac (TORADOL) 10 MG tablet Take 10 mg by mouth every 6 (six) hours as needed.     montelukast (SINGULAIR) 10 MG tablet Take 10 mg by mouth at bedtime.       Omega-3 Fatty Acids (OMEGA 3 PO) Take 1 capsule by mouth daily.       OVER THE COUNTER MEDICATION Metagenics Ultra Flora IB Probiotic     OVER THE COUNTER MEDICATION 2 (two) times daily. Cysta Q     rosuvastatin (CRESTOR) 5 MG tablet TAKE 1 TABLET (5 MG TOTAL) BY MOUTH DAILY AT 6 PM. 90 tablet 1   sertraline (ZOLOFT) 50 MG tablet Take 25 mg by mouth daily.     Cholecalciferol (VITAMIN D3 PO) Take 1,000 Units by mouth daily.     No current facility-administered medications for this visit.     Allergies  Allergen Reactions   Codeine Nausea And Vomiting and Other (See Comments)    Migraine headache   Lipitor [Atorvastatin] Other (See Comments)    Muscle ache, mind foggy,    Latex Hives, Rash and Other (See Comments)    Past Medical History:  Diagnosis Date   Asthma    Atrophic vaginitis    Blood transfusion without reported diagnosis 1975   with vaginal delivery--very anemic   Cystic teratoma    Elevated cholesterol    Endometriosis    Esophageal ulcer    Fibromyalgia    Fundic gland polyps of stomach, benign    GERD (gastroesophageal reflux disease)    History of diastolic dysfunction    History of migraine headaches    Leg edema    left leg    Ocular rosacea    Osteoarthritis    back,hands, knees   Overactive bladder    Raynaud's disease    Status post cystoscopy 09/06/2019   [facility]   Tachycardia    Tubular adenoma of colon 08/2016    Past Surgical History:  Procedure Laterality Date   APPENDECTOMY     BACK SURGERY     BREAST EXCISIONAL BIOPSY Left    BREAST SURGERY  10/09/2013   breast reduction--Dr. Stephens November   BUNIONECTOMY     bilateral   CARDIOVASCULAR STRESS TEST  07/02/2008   EF 55-60%   CATARACT EXTRACTION BILATERAL W/ ANTERIOR VITRECTOMY     2023   colonscopy     DENTAL SURGERY  2007   ESOPHAGOGASTRODUODENOSCOPY ENDOSCOPY     EYE SURGERY Bilateral 01/2022   cataracts   FOOT SURGERY     LEFT FOOT   left breast mass removed     OOPHORECTOMY  1982   RSO partial and LSO, uterus   REDUCTION MAMMAPLASTY Bilateral    RSO  2002   partial   ruptured disc surgery  05/2007   TOTAL ABDOMINAL HYSTERECTOMY      Social History   Tobacco Use  Smoking Status Never   Passive exposure: Past  Smokeless Tobacco Never    Social History   Substance and Sexual Activity  Alcohol Use Yes   Alcohol/week: 3.0 standard drinks of alcohol  Types: 3 Standard drinks or equivalent per week   Comment: social     Family History  Problem Relation Age of Onset   Colon polyps Mother    Asthma Mother    Hypertension Mother    Osteopenia Mother    Coronary artery disease Father    Hypertension Father    Heart disease Father    Heart attack Father    Skin cancer Sister        melanoma   Hepatitis C Brother    Skin cancer Brother        melanoma   Colon polyps Maternal Grandmother    Alzheimer's disease Maternal Grandmother    Lung cancer Maternal Grandfather    Heart attack Maternal Grandfather    Heart failure Paternal Grandfather    Heart attack Paternal Grandfather    Asthma Maternal Aunt    Stomach cancer Maternal Aunt    Pancreatic cancer Maternal Aunt    Asthma Grandson    Colon cancer Neg Hx     Rectal cancer Neg Hx    Breast cancer Neg Hx    Esophageal cancer Neg Hx     Reviw of Systems:  Reviewed in the HPI.  All other systems are negative.   Physical Exam: Blood pressure 122/74, pulse 76, height 5\' 6"  (1.676 m), weight 141 lb 9.6 oz (64.2 kg), last menstrual period 11/07/1980, SpO2 98%.       GEN:  Well nourished, well developed in no acute distress HEENT: Normal NECK: No JVD; No carotid bruits LYMPHATICS: No lymphadenopathy CARDIAC: RRR , no murmurs, rubs, gallops RESPIRATORY:  Clear to auscultation without rales, wheezing or rhonchi  ABDOMEN: Soft, non-tender, non-distended MUSCULOSKELETAL:  No edema; No deformity  SKIN: Warm and dry NEUROLOGIC:  Alert and oriented x 3    ECG:  EKG Interpretation Date/Time:  Monday December 04 2023 13:50:23 EST Ventricular Rate:  76 PR Interval:  102 QRS Duration:  86 QT Interval:  416 QTC Calculation: 468 R Axis:   -27  Text Interpretation: Sinus rhythm with short PR Nonspecific ST abnormality When compared with ECG of 31-May-2007 06:22, QRS axis Shifted left Confirmed by Kristeen Miss ([id number]) on 12/04/2023 2:25:50 PM       Assessment / Plan:     1.  Transient pressure, shortness of breath Debbie presents with episodes of chest pressure and shortness of breath.  She is able to exercise without too much trouble but has intense chest pressure and some increased dyspnea at other times.  She has a very strong family history of coronary artery disease and has hyperlipidemia.  Will get a coronary CT angiogram for further evaluation.  I would also like to get an echocardiogram.    2. Hyperlipidemia: She tolerates only low-dose rosuvastatin and only tolerates 5 mg 5 days a week.  If she is found to have significant coronary artery disease or coronary artery calcifications will need to consider PCSK9 inhibitor for further LDL reduction.  Last LDL was 98.     3. Supraventricular  Tachycardia:      4.  Raynaud's  phenomenon:   had some issues last week during the cold weather       Kristeen Miss, MD  12/04/2023 2:25 PM    [facility] [address] [place],  [address] [place], [place]  [address] Pager [phone] Phone: [phone]; Fax: [phone]

## 2023-12-04 ENCOUNTER — Other Ambulatory Visit: Payer: Self-pay | Admitting: Internal Medicine

## 2023-12-04 ENCOUNTER — Ambulatory Visit: Payer: Medicare Other | Attending: Cardiovascular Disease | Admitting: Cardiovascular Disease

## 2023-12-04 ENCOUNTER — Encounter: Payer: Self-pay | Admitting: Cardiovascular Disease

## 2023-12-04 VITALS — BP 122/74 | HR 76 | Ht 66.0 in | Wt 141.6 lb

## 2023-12-04 DIAGNOSIS — R0789 Other chest pain: Secondary | ICD-10-CM | POA: Insufficient documentation

## 2023-12-04 DIAGNOSIS — R002 Palpitations: Secondary | ICD-10-CM | POA: Diagnosis not present

## 2023-12-04 DIAGNOSIS — Z01810 Encounter for preprocedural cardiovascular examination: Secondary | ICD-10-CM | POA: Insufficient documentation

## 2023-12-04 DIAGNOSIS — R0600 Dyspnea, unspecified: Secondary | ICD-10-CM | POA: Diagnosis not present

## 2023-12-04 DIAGNOSIS — I471 Supraventricular tachycardia, unspecified: Secondary | ICD-10-CM | POA: Insufficient documentation

## 2023-12-04 DIAGNOSIS — I209 Angina pectoris, unspecified: Secondary | ICD-10-CM | POA: Diagnosis not present

## 2023-12-04 DIAGNOSIS — Z1231 Encounter for screening mammogram for malignant neoplasm of breast: Secondary | ICD-10-CM

## 2023-12-04 MED ORDER — DILTIAZEM HCL ER COATED BEADS 120 MG PO CP24: 120.0000 mg | ORAL_CAPSULE | Freq: Every day | ORAL | 3 refills | Status: DC

## 2023-12-04 MED ORDER — ROSUVASTATIN CALCIUM 5 MG PO TABS: 5.0000 mg | ORAL_TABLET | Freq: Every day | ORAL | 3 refills | Status: DC

## 2023-12-04 NOTE — Patient Instructions (Addendum)
Lab Work: BMET today If you have labs (blood work) drawn today and your tests are completely normal, you will receive your results only by: [employer] (if you have MyChart) OR A paper copy in the mail If you have any lab test that is abnormal or we need to change your treatment, we will call you to review the results.  Testing/Procedures: ECHO Your physician has requested that you have an echocardiogram. Echocardiography is a painless test that uses sound waves to create images of your heart. It provides your doctor with information about the size and shape of your heart and how well your heart's chambers and valves are working. This procedure takes approximately one hour. There are no restrictions for this procedure. Please do NOT wear cologne, perfume, aftershave, or lotions (deodorant is allowed). Please arrive 15 minutes prior to your appointment time.  Please note: We ask at that you not bring children with you during ultrasound (echo/ vascular) testing. Due to room size and safety concerns, children are not allowed in the ultrasound rooms during exams. Our front office staff cannot provide observation of children in our lobby area while testing is being conducted. An adult accompanying a patient to their appointment will only be allowed in the ultrasound room at the discretion of the ultrasound technician under special circumstances. We apologize for any inconvenience.  Coronary CT Angiogram Your physician has requested that you have cardiac CT. Cardiac computed tomography (CT) is a painless test that uses an x-ray machine to take clear, detailed pictures of your heart. For further information please visit [email or web address] Please follow instruction sheet as given.   Follow-Up: At [facility], you and your health needs are our priority.  As part of our continuing mission to provide you with exceptional heart care, we have created designated Provider Care Teams.  These Care  Teams include your primary Cardiologist (physician) and Advanced Practice Providers (APPs -  Physician Assistants and Nurse Practitioners) who all work together to provide you with the care you need, when you need it.  Your next appointment:   3 month(s)  Provider:   Kristeen Miss, MD     Your cardiac CT will be scheduled at one of the below locations:   [facility] [address] [place], [place] [address] [phone]  please arrive at the [facility] and Children's Entrance (Entrance C2) of [facility] 30 minutes prior to test start time. You can use the FREE valet parking offered at entrance C (encouraged to control the heart rate for the test)  Proceed to the [facility] Radiology Department (first floor) to check-in and test prep.  All radiology patients and guests should use entrance C2 at [facility], accessed from [facility], even though the hospital's physical address listed is [address].     Please follow these instructions carefully (unless otherwise directed):  An IV will be required for this test and Nitroglycerin will be given.   On the Night Before the Test: Be sure to Drink plenty of water. Do not consume any caffeinated/decaffeinated beverages or chocolate 12 hours prior to your test. Do not take any antihistamines 12 hours prior to your test.  On the Day of the Test: Drink plenty of water until 1 hour prior to the test. Do not eat any food 1 hour prior to test. You may take your regular medications prior to the test.  Take extra Diltiazem the morning of test Patients who wear a  continuous glucose monitor MUST remove the device prior to scanning. FEMALES- please wear underwire-free bra if available, avoid dresses & tight clothing  After the Test: Drink plenty of water. After receiving IV contrast, you may experience a mild flushed feeling. This is normal. On occasion, you may experience a mild rash up to  24 hours after the test. This is not dangerous. If this occurs, you can take Benadryl 25 mg and increase your fluid intake. If you experience trouble breathing, this can be serious. If it is severe call 911 IMMEDIATELY. If it is mild, please call our office.  We will call to schedule your test 2-4 weeks out understanding that some insurance companies will need an authorization prior to the service being performed.   For more information and frequently asked questions, please visit our website : [email or web address]  For non-scheduling related questions, please contact the cardiac imaging nurse navigator should you have any questions/concerns: Cardiac Imaging Nurse Navigators Direct Office Dial: [phone]   For scheduling needs, including cancellations and rescheduling, please call [place], [phone].      1st Floor: - Lobby - Registration  - Pharmacy  - Lab - Cafe  2nd Floor: - PV Lab - Diagnostic Testing (echo, CT, nuclear med)  3rd Floor: - Vacant  4th Floor: - TCTS (cardiothoracic surgery) - AFib Clinic - Structural Heart Clinic - Vascular Surgery  - Vascular Ultrasound  5th Floor: - HeartCare Cardiology (general and EP) - Clinical Pharmacy for coumadin, hypertension, lipid, weight-loss medications, and med management appointments    Valet parking services will be available as well.

## 2023-12-05 ENCOUNTER — Encounter: Payer: Self-pay | Admitting: Cardiovascular Disease

## 2023-12-05 LAB — BASIC METABOLIC PANEL
BUN/Creatinine Ratio: 14 (ref 12–28)
BUN: 12 mg/dL (ref 8–27)
CO2: 22 mmol/L (ref 20–29)
Calcium: 9.8 mg/dL (ref 8.7–10.3)
Chloride: 106 mmol/L (ref 96–106)
Creatinine, Ser: 0.84 mg/dL (ref 0.57–1.00)
Glucose: 88 mg/dL (ref 70–99)
Potassium: 4.9 mmol/L (ref 3.5–5.2)
Sodium: 141 mmol/L (ref 134–144)
eGFR: 76 mL/min/{1.73_m2} (ref >59)

## 2023-12-05 LAB — LIPOPROTEIN A (LPA): Lipoprotein (a): 11.2 nmol/L (ref <75.0)

## 2023-12-05 LAB — TSH: TSH: 1.44 u[IU]/mL (ref 0.450–4.500)

## 2023-12-14 ENCOUNTER — Telehealth (HOSPITAL_COMMUNITY): Payer: Self-pay | Admitting: *Deleted

## 2023-12-14 NOTE — Telephone Encounter (Signed)
 Reaching out to patient to offer assistance regarding upcoming cardiac imaging study; pt verbalizes understanding of appt date/time, parking situation and where to check in, pre-test NPO status and medications ordered, and verified current allergies; name and call back number provided for further questions should they arise Sid Seats RN Navigator Cardiac Imaging [facility] [phone] office [phone] cell

## 2023-12-15 ENCOUNTER — Ambulatory Visit (HOSPITAL_COMMUNITY)
Admission: RE | Admit: 2023-12-15 | Discharge: 2023-12-15 | Disposition: A | Discharge status: Home / Self Care | Payer: Medicare Other | Source: Ambulatory Visit | Attending: Cardiovascular Disease | Admitting: Cardiovascular Disease

## 2023-12-15 DIAGNOSIS — R0600 Dyspnea, unspecified: Secondary | ICD-10-CM | POA: Diagnosis not present

## 2023-12-15 DIAGNOSIS — R0789 Other chest pain: Secondary | ICD-10-CM | POA: Diagnosis not present

## 2023-12-15 DIAGNOSIS — R002 Palpitations: Secondary | ICD-10-CM | POA: Diagnosis not present

## 2023-12-15 DIAGNOSIS — I209 Angina pectoris, unspecified: Secondary | ICD-10-CM | POA: Diagnosis present

## 2023-12-15 DIAGNOSIS — Z01810 Encounter for preprocedural cardiovascular examination: Secondary | ICD-10-CM | POA: Diagnosis not present

## 2023-12-15 DIAGNOSIS — I471 Supraventricular tachycardia, unspecified: Secondary | ICD-10-CM | POA: Insufficient documentation

## 2023-12-15 MED ORDER — NITROGLYCERIN 0.4 MG SL SUBL
0.8000 mg | SUBLINGUAL_TABLET | Freq: Once | SUBLINGUAL | Status: AC
  Administered 2023-12-15: 0.8 mg via SUBLINGUAL

## 2023-12-15 MED ORDER — NITROGLYCERIN 0.4 MG SL SUBL
SUBLINGUAL_TABLET | SUBLINGUAL | Status: AC
  Filled 2023-12-15: qty 2

## 2023-12-15 MED ORDER — IOHEXOL 350 MG/ML SOLN
100.0000 mL | Freq: Once | INTRAVENOUS | Status: AC | PRN
  Administered 2023-12-15: 100 mL via INTRAVENOUS

## 2023-12-19 ENCOUNTER — Encounter: Payer: Self-pay | Admitting: Cardiovascular Disease

## 2023-12-20 DIAGNOSIS — S82831A Other fracture of upper and lower end of right fibula, initial encounter for closed fracture: Secondary | ICD-10-CM | POA: Diagnosis not present

## 2023-12-20 DIAGNOSIS — T1490XA Injury, unspecified, initial encounter: Secondary | ICD-10-CM | POA: Diagnosis not present

## 2023-12-21 ENCOUNTER — Ambulatory Visit (HOSPITAL_COMMUNITY): Payer: Medicare Other | Attending: Cardiology

## 2023-12-21 ENCOUNTER — Other Ambulatory Visit: Payer: Self-pay | Admitting: *Deleted

## 2023-12-21 ENCOUNTER — Other Ambulatory Visit (HOSPITAL_COMMUNITY): Payer: Self-pay | Admitting: *Deleted

## 2023-12-21 DIAGNOSIS — E785 Hyperlipidemia, unspecified: Secondary | ICD-10-CM

## 2023-12-21 DIAGNOSIS — M25571 Pain in right ankle and joints of right foot: Secondary | ICD-10-CM | POA: Diagnosis not present

## 2023-12-21 DIAGNOSIS — R002 Palpitations: Secondary | ICD-10-CM | POA: Insufficient documentation

## 2023-12-21 DIAGNOSIS — Z01810 Encounter for preprocedural cardiovascular examination: Secondary | ICD-10-CM | POA: Insufficient documentation

## 2023-12-21 DIAGNOSIS — I471 Supraventricular tachycardia, unspecified: Secondary | ICD-10-CM | POA: Insufficient documentation

## 2023-12-21 DIAGNOSIS — R0600 Dyspnea, unspecified: Secondary | ICD-10-CM | POA: Diagnosis not present

## 2023-12-21 DIAGNOSIS — R0789 Other chest pain: Secondary | ICD-10-CM | POA: Insufficient documentation

## 2023-12-21 DIAGNOSIS — Z79899 Other long term (current) drug therapy: Secondary | ICD-10-CM

## 2023-12-21 LAB — ECHOCARDIOGRAM COMPLETE
Area-P 1/2: 3.62 cm2
S' Lateral: 2.7 cm

## 2023-12-22 ENCOUNTER — Encounter: Payer: Self-pay | Admitting: Cardiovascular Disease

## 2024-01-03 DIAGNOSIS — M25571 Pain in right ankle and joints of right foot: Secondary | ICD-10-CM | POA: Diagnosis not present

## 2024-01-11 ENCOUNTER — Ambulatory Visit: Payer: Medicare Other

## 2024-01-16 ENCOUNTER — Other Ambulatory Visit: Payer: Self-pay | Admitting: Internal Medicine

## 2024-01-16 DIAGNOSIS — H903 Sensorineural hearing loss, bilateral: Secondary | ICD-10-CM | POA: Diagnosis not present

## 2024-01-16 NOTE — Telephone Encounter (Signed)
 Please advise,states it is discontinued but at last visit was given instruction to use it

## 2024-01-17 DIAGNOSIS — M25571 Pain in right ankle and joints of right foot: Secondary | ICD-10-CM | POA: Diagnosis not present

## 2024-01-29 DIAGNOSIS — Z1212 Encounter for screening for malignant neoplasm of rectum: Secondary | ICD-10-CM | POA: Diagnosis not present

## 2024-01-29 DIAGNOSIS — E781 Pure hyperglyceridemia: Secondary | ICD-10-CM | POA: Diagnosis not present

## 2024-01-29 DIAGNOSIS — E7800 Pure hypercholesterolemia, unspecified: Secondary | ICD-10-CM | POA: Diagnosis not present

## 2024-01-29 DIAGNOSIS — K219 Gastro-esophageal reflux disease without esophagitis: Secondary | ICD-10-CM | POA: Diagnosis not present

## 2024-02-02 ENCOUNTER — Ambulatory Visit
Admission: RE | Admit: 2024-02-02 | Discharge: 2024-02-02 | Disposition: A | Discharge status: Home / Self Care | Source: Ambulatory Visit | Attending: Internal Medicine | Admitting: Internal Medicine

## 2024-02-02 DIAGNOSIS — Z1231 Encounter for screening mammogram for malignant neoplasm of breast: Secondary | ICD-10-CM

## 2024-02-05 DIAGNOSIS — K219 Gastro-esophageal reflux disease without esophagitis: Secondary | ICD-10-CM | POA: Diagnosis not present

## 2024-02-05 DIAGNOSIS — J4540 Moderate persistent asthma, uncomplicated: Secondary | ICD-10-CM | POA: Diagnosis not present

## 2024-02-05 DIAGNOSIS — M5416 Radiculopathy, lumbar region: Secondary | ICD-10-CM | POA: Diagnosis not present

## 2024-02-05 DIAGNOSIS — Z0000 Encounter for general adult medical examination without abnormal findings: Secondary | ICD-10-CM | POA: Diagnosis not present

## 2024-02-05 DIAGNOSIS — Z23 Encounter for immunization: Secondary | ICD-10-CM | POA: Diagnosis not present

## 2024-02-05 DIAGNOSIS — F331 Major depressive disorder, recurrent, moderate: Secondary | ICD-10-CM | POA: Diagnosis not present

## 2024-02-05 DIAGNOSIS — Z1339 Encounter for screening examination for other mental health and behavioral disorders: Secondary | ICD-10-CM | POA: Diagnosis not present

## 2024-02-05 DIAGNOSIS — G43909 Migraine, unspecified, not intractable, without status migrainosus: Secondary | ICD-10-CM | POA: Diagnosis not present

## 2024-02-05 DIAGNOSIS — Z1331 Encounter for screening for depression: Secondary | ICD-10-CM | POA: Diagnosis not present

## 2024-02-05 DIAGNOSIS — K589 Irritable bowel syndrome without diarrhea: Secondary | ICD-10-CM | POA: Diagnosis not present

## 2024-02-05 DIAGNOSIS — N3010 Interstitial cystitis (chronic) without hematuria: Secondary | ICD-10-CM | POA: Diagnosis not present

## 2024-02-05 DIAGNOSIS — M797 Fibromyalgia: Secondary | ICD-10-CM | POA: Diagnosis not present

## 2024-02-05 DIAGNOSIS — K224 Dyskinesia of esophagus: Secondary | ICD-10-CM | POA: Diagnosis not present

## 2024-02-05 DIAGNOSIS — R82998 Other abnormal findings in urine: Secondary | ICD-10-CM | POA: Diagnosis not present

## 2024-02-05 DIAGNOSIS — E7800 Pure hypercholesterolemia, unspecified: Secondary | ICD-10-CM | POA: Diagnosis not present

## 2024-02-26 ENCOUNTER — Encounter: Payer: Self-pay | Admitting: Internal Medicine

## 2024-02-26 ENCOUNTER — Ambulatory Visit (INDEPENDENT_AMBULATORY_CARE_PROVIDER_SITE_OTHER): Payer: Medicare Other | Admitting: Internal Medicine

## 2024-02-26 VITALS — BP 112/64 | HR 74 | Ht 66.0 in | Wt 144.6 lb

## 2024-02-26 DIAGNOSIS — J302 Other seasonal allergic rhinitis: Secondary | ICD-10-CM | POA: Diagnosis not present

## 2024-02-26 DIAGNOSIS — J4540 Moderate persistent asthma, uncomplicated: Secondary | ICD-10-CM | POA: Diagnosis not present

## 2024-02-26 MED ORDER — AZELASTINE HCL 137 MCG/SPRAY NA SOLN: 1.0000 | Freq: Every day | NASAL | 3 refills | Status: AC

## 2024-02-26 MED ORDER — DULERA 200-5 MCG/ACT IN AERO: 2.0000 | INHALATION_SPRAY | Freq: Two times a day (BID) | RESPIRATORY_TRACT | 3 refills | Status: AC

## 2024-02-26 MED ORDER — ALBUTEROL SULFATE HFA 108 (90 BASE) MCG/ACT IN AERS: 2.0000 | INHALATION_SPRAY | Freq: Four times a day (QID) | RESPIRATORY_TRACT | 5 refills | Status: AC | PRN

## 2024-02-26 NOTE — Patient Instructions (Addendum)
 It was a pleasure to see you today!  Please schedule follow up with myself in 1 year.  If my schedule is not open yet, we will contact you with a reminder closer to that time. Please call [phone] if you haven't heard from us  a month before, and always call us  sooner if issues or concerns arise. You can also send us  a message through MyChart, but but aware that this is not to be used for urgent issues and it may take up to 5-7 days to receive a reply. Please be aware that you will likely be able to view your results before I have a chance to respond to them. Please give us  5 business days to respond to any non-urgent results.   Glad the asthma is improved!   Continue the astelin  and anti-histamine daily especially during allergy season. I think this is keeping your asthma well managed. Continue montelukast.   Continue your dulera  2 puffs twice daily, gargle after use.   Continue albuterol  inhaler as needed.

## 2024-02-26 NOTE — Progress Notes (Signed)
 Lauren Montes    [id number]    [birth year]  Primary Care Physician:Tisovec, Kristina Pfeiffer, MD Date of Appointment: 02/26/2024 Established Patient Visit  Chief complaint:   Chief Complaint  Patient presents with   Follow-up    F/U OV. Pt doing well.      HPI: Lauren Montes is a 68 y.o. woman with moderate persistent asthma with allergic rhinitis.   Interval Updates: Here for follow up.  Has been doing really well since last visit. Still on dulera . No interval exacerbations.  Rescue inhaler use is less than 1-2 times/week.  Doing really well on astelin .   Current Regimen: dulera  200 2 puffs twice a day, Albuterol  prn.  Asthma Triggers: environmental allergens, seasonal changes, fall is worst, rain.  Exacerbations in the last year: 2 in 2024 - none in 2025 History of hospitalization or intubation: never. Allergy Testing: has had allergy testing in the past 3 years - allergic to dust mites GERD: denies Allergic Rhinitis: Takes cetirizine  and astelin  ACT:  Asthma Control Test ACT Total Score  07/05/2023  3:46 PM 17  11/22/2022  1:29 PM 23  08/18/2022  2:53 PM 12   FeNO:  I have reviewed the patient's family social and past medical history and updated as appropriate.   Past Medical History:  Diagnosis Date   Asthma    Atrophic vaginitis    Blood transfusion without reported diagnosis 1975   with vaginal delivery--very anemic   Cystic teratoma    Elevated cholesterol    Endometriosis    Esophageal ulcer    Fibromyalgia    Fundic gland polyps of stomach, benign    GERD (gastroesophageal reflux disease)    History of diastolic dysfunction    History of migraine headaches    Leg edema    left leg   Ocular rosacea    Osteoarthritis    back,hands, knees   Overactive bladder    Raynaud's disease    Status post cystoscopy [facility]/06/2019   [facility]   Tachycardia    Tubular adenoma of colon 08/2016    Past Surgical History:  Procedure  Laterality Date   APPENDECTOMY     BACK SURGERY     BREAST EXCISIONAL BIOPSY Left    BREAST SURGERY  10/09/2013   breast reduction--Dr. Reginia Caprice   BUNIONECTOMY     bilateral   CARDIOVASCULAR STRESS TEST  07/02/2008   EF 55-60%   CATARACT EXTRACTION BILATERAL W/ ANTERIOR VITRECTOMY     2023   colonscopy     DENTAL SURGERY  2007   ESOPHAGOGASTRODUODENOSCOPY ENDOSCOPY     EYE SURGERY Bilateral 01/2022   cataracts   FOOT SURGERY     LEFT FOOT   left breast mass removed     OOPHORECTOMY  1982   RSO partial and LSO, uterus   REDUCTION MAMMAPLASTY Bilateral 2012   RSO  2002   partial   ruptured disc surgery  05/2007   TOTAL ABDOMINAL HYSTERECTOMY      Family History  Problem Relation Age of Onset   Colon polyps Mother    Asthma Mother    Hypertension Mother    Osteopenia Mother    Coronary artery disease Father    Hypertension Father    Heart disease Father    Heart attack Father    Skin cancer Sister        melanoma   Hepatitis C Brother    Skin cancer Brother  melanoma   Colon polyps Maternal Grandmother    Alzheimer's disease Maternal Grandmother    Lung cancer Maternal Grandfather    Heart attack Maternal Grandfather    Heart failure Paternal Grandfather    Heart attack Paternal Grandfather    Asthma Maternal Aunt    Stomach cancer Maternal Aunt    Pancreatic cancer Maternal Aunt    Asthma Grandson    Colon cancer Neg Hx    Rectal cancer Neg Hx    Breast cancer Neg Hx    Esophageal cancer Neg Hx     Social History   Occupational History   Occupation: retired    Associate Professor: UNEMPLOYED  Tobacco Use   Smoking status: Never    Passive exposure: Past   Smokeless tobacco: Never  Vaping Use   Vaping status: Never Used  Substance and Sexual Activity   Alcohol use: Yes    Alcohol/week: 3.0 standard drinks of alcohol    Types: 3 Standard drinks or equivalent per week    Comment: social    Drug use: No   Sexual activity: Yes    Partners: Male     Birth control/protection: Surgical    Comment: Hyst, first intercourse <16, less than 5 partners     Physical Exam: Blood pressure 112/64, pulse 74, height 5\' 6"  (1.676 m), weight 144 lb 9.6 oz (65.6 kg), last menstrual period 11/07/1980, SpO2 100%.  Gen:      No distress, no coughing ENT:  no nasal polyps, mucus membranes moist Lungs:    ctab no wheeze CV:         RRR   Data Reviewed: Imaging: I have personally reviewed the   PFTs:   Labs: Lab Results  Component Value Date   WBC 7.1 08/15/2016   HGB 13.9 08/15/2016   HCT 41.4 08/15/2016   MCV 91.1 08/15/2016   PLT 248.0 08/15/2016   Lab Results  Component Value Date   NA 141 12/04/2023   K 4.9 12/04/2023   CL 106 12/04/2023   CO2 22 12/04/2023     Immunization status: Immunization History  Administered Date(s) Administered   Influenza Split 08/17/2011, 08/14/2012, 08/15/2013, 08/06/2014   Influenza, Quadrivalent, Recombinant, Inj, Pf 07/04/2018, 08/08/2019, 07/29/2020   Influenza,inj,Quad PF,6+ Mos 08/15/2013, 08/06/2014, 08/08/2015, 06/27/2022   Influenza-Unspecified 06/28/2016, 07/07/2017   PFIZER(Purple Top)SARS-COV-2 Vaccination 01/06/2020, 02/08/2020, 08/30/2020   Pneumococcal Conjugate-13 06/28/2016   Pneumococcal Polysaccharide-23 12/01/2009   Td 06/04/2013   Tdap 12/01/2009   Zoster Recombinant(Shingrix) 07/08/2018, 01/03/2019   Zoster, Live 01/03/2019    External Records Personally Reviewed: pulmonary  Assessment:  Moderate persistent asthma well controlled.  Seasonal allergic rhinitis, controlled  Plan/Recommendations:  Glad the asthma is improved!   Continue the astelin  and anti-histamine daily especially during allergy season. I think this is keeping your asthma well managed.  Continue  montelukast  \Continue your dulera  2 puffs twice daily, gargle after use.   Continue albuterol  inhaler as needed.   Return to Care: Return in about 1 year (around 02/25/2025).   Louie Rover,  MD Pulmonary and Critical Care Medicine [facility] [phone]

## 2024-03-03 ENCOUNTER — Encounter: Payer: Self-pay | Admitting: Cardiovascular Disease

## 2024-03-03 NOTE — Progress Notes (Unsigned)
 Lauren Montes Date of Birth  1956/05/07 [facility] / [facility] [address]     [address] [place], [place]  [address] [phone]  Fax  [phone]  1. Hyperlipidemia 2. Sinus tachycardia, SVT 3. Chronic asthma     Lauren Montes is a middle-age female the history of mild diastolic dysfunction as well as leg edema. She also has a history of hypercholesterolemia.   Has a history of hyperlipidemia. We have had her on Crestor  the past which she tolerated very well. Her insurance and he refused to pay for to try her on atorvastatin . This caused her to have lots of muscle aches as well as memory loss. We discontinued the atorvastatin  at that time.  She has been recording her heart rate and blood pressure readings. Her heart rate is frequently above 100. He also has many readings in the 80-90 range.  Has bruising particularly on her hands and on her fingers. [name] never had any easy bleeding. She did not have any bleeding  complications. during her pregnancies or deliveries.  Feb. 4, 2014: Lauren Montes is doing well.  Her HR and BP have been well controlled.   June 17, 2013:  Lauren Montes is doing well.  She has started waking over the past several weeks.    June 21, 2014:  Lauren Montes is having some issues with DOE.    Perhaps slight chest pressure with exertion.  No pain. .  Sleeps on 2 pillows occasionally at night - not every night.  [name] very careful with her salt intake. She and her husband thinks most of her meals of scratch. She denies any leg swelling   Nov. 17, 2015:  Lauren Montes is doing well.  She is seen for chronic dyspnea. Her echo was normal.  myoview showed no ischemia Occasional heart racing.  Not severe.  Does not take any meds.  Resolves after several minutes. She is walking for exercise - 30-45 minutes 3 times a week.  Also do some strength training  Nov. 17, 2016: Overall doing ok Lots of stress - aging parents  Lots of muscle cramps in  legs and feet.   Feb. 27, 2017:  Lauren Montes is seen back  Cholesterol is back up - she was not able to takes atorvastatin  or Pravachol  because of severe muscle aches and cramping  And mental foggyness. She has  Tried  Crestor  in the past and it caused muscle cramps as well . [name] now on Zetia .  May 18 , 2017:  Lauren Montes is seen today for a work in visit.  She has been having lots of swelling in her feet and hands ( feet > hands)  We gave her Lasix  and Kdur to take for several days.  She took the Lasix  and Kdur for 2 days .   Has not needed it sense  has had some fatigue  Has not really eaten any more salt.  This has been occuring off and of for the past 6 weeks.  Is having palpitations at night  She has had increased anxiety.  Still able to do all of her usual activities without dyspnea.   She recently stopped taking her low dose estrogen ( 2-3 weeks ago )   Nov. 20, 2017:     No issues,  No SVT Takes 1/2 zoloft  a day - anxiety may have been plaing a part  Has really cut down on the cholesterol in her diet  Getting exercise. Has lost 20 lbs.  Would like to  decrease the crestor  to every other day instead of daily .  Nov. 20, 2018  Doing great No significant  Tachycardia Has greatly reduced her meat intake.   Has a seafood allergy Eats some chicken.    Lots of beans .     Nov. 20, 2019  Lauren Montes is seen today  Doing very well  BP and HR are well controlled.  Has occasional episodes of tachycardia , esp at night or if she gets overexerted. Has not been exercising  Seems to occur daily  Always when she lies down at night   Some of her symptoms sound like volume depletion She drinks water all day long but then says that she has heart pounding frequently through the day.  She is having issues with her IBS .  Has not been eating much protein   November 11, 2019: Lauren Montes is seen today for follow-up of her hypertension and supraventricular tachycardia. Has been diagnosed with  interstitial cyctitis .  Is on lots of pain meds.  Still able to exercise failry well Trying to walk regularly   Jan. 3, 2022 Lauren Montes is seen today for follow up of her HTN and SVT. No cardiac issues. Has some leg cramps  - is walking 3 times a week , 45 minutes each time .   Jan. 24, 2023 Lauren Montes is seen for follow up of her HTN and SVT Her Raynaulds symptoms are worse this year   By is on the low side  Suggested liquid iv in her water   She has been having leg aches/muscle cramps.  She thinks it may be related to rosuvastatin .  We will stop the rosuvastatin  for a couple of weeks and see if this helps these leg aches/cramps.  If she does end up stopping the rosuvastatin  we will get a coronary calcium  score to assess her long-term risk of coronary artery disease  May 30, 2022 Lauren Montes is seen for follow up of her HTN and SVT Had Raynaulds symptoms this past winter. We gave her a prescription for Sildenafil  but she could not get it through insurance - we discussed Good rx   Just got back from a vacation at [facility]   Jan. 27, 2025 Lauren Montes is seen for follow up of her HTN, SVT, Raynaulds syndrome  Rare palpitations, might last 30 seconds   Having more shortness of breath ,  climbing stairs  With general movement  She has occasional episodes of chest pressure. She walks at a brisk pace for 50 minutes  without significant dyspnea  She thinks the chest pressure  She only tolerates low dose rosuvastatin  5 mg just 5 times a week Has also tried atorvastatin  - lots of muscle ache.     She wante to discuss coronary calcium  score  + strong family hx of CAD  Father was in his 67s when he was diagnosed  Grandfather passed away in his 77s CAD  2024-03-10 Lauren Montes is seen for follow up of her SVT, hyperlipidemia  Coronary CTAfrom Feb. 2025 : CAC score is 0 Minimal plaque in her LAD and LCx. Her LP(a) is normal ( 11.2)   Is staying busy [place] and broke her ankle so has been in  rehab.  No CP       Current Outpatient Medications  Medication Sig Dispense Refill   albuterol  (VENTOLIN  HFA) 108 (90 Base) MCG/ACT inhaler Inhale 2 puffs into the lungs every 6 (six) hours as needed for wheezing or shortness of breath. 18  g 5   Aloe Vera 25 MG CAPS Take by mouth.     Azelastine  HCl 137 MCG/SPRAY SOLN Place 1 spray into both nostrils daily. 90 mL 3   cetirizine  (ZYRTEC ) 10 MG tablet Take 10 mg by mouth daily.     cycloSPORINE (RESTASIS) 0.05 % ophthalmic emulsion 1  into affected eye     diltiazem  (CARDIZEM  CD) 120 MG 24 hr capsule Take 1 capsule (120 mg total) by mouth daily. 90 capsule 3   DULERA  200-5 MCG/ACT AERO Inhale 2 puffs into the lungs 2 (two) times daily. 3 each 3   EPINEPHrine 0.3 mg/0.3 mL IJ SOAJ injection Inject 1 Dose into the muscle as needed.     Estradiol  (VAGIFEM ) 10 MCG TABS vaginal tablet Place 1 tablet (10 mcg total) vaginally 2 (two) times a week. 24 tablet 3   ketorolac  (TORADOL ) 10 MG tablet Take 10 mg by mouth every 6 (six) hours as needed.     montelukast (SINGULAIR) 10 MG tablet Take 10 mg by mouth at bedtime.       Omega-3 Fatty Acids (OMEGA 3 PO) Take 1 capsule by mouth daily.       OVER THE COUNTER MEDICATION Met[employer]ics Ultra Flora IB Probiotic     OVER THE COUNTER MEDICATION 2 (two) times daily. Cysta Q     rosuvastatin  (CRESTOR ) 5 MG tablet Take 1 tablet (5 mg total) by mouth daily at 6 PM. 90 tablet 3   sertraline  (ZOLOFT ) 50 MG tablet Take 25 mg by mouth daily.     No current facility-administered medications for this visit.     Allergies  Allergen Reactions   Codeine Nausea And Vomiting and Other (See Comments)    Migraine headache   Lipitor [Atorvastatin ] Other (See Comments)    Muscle ache, mind foggy,    Latex Hives, Rash and Other (See Comments)    Past Medical History:  Diagnosis Date   Asthma    Atrophic vaginitis    Blood transfusion without reported diagnosis 1975   with vaginal delivery--very anemic   Cystic  teratoma    Elevated cholesterol    Endometriosis    Esophageal ulcer    Fibromyalgia    Fundic gland polyps of stomach, benign    GERD (gastroesophageal reflux disease)    History of diastolic dysfunction    History of migraine headaches    Leg edema    left leg   Ocular rosacea    Osteoarthritis    back,hands, knees   Overactive bladder    Raynaud's disease    Status post cystoscopy [facility]/06/2019   [facility]   Tachycardia    Tubular adenoma of colon 08/2016    Past Surgical History:  Procedure Laterality Date   APPENDECTOMY     BACK SURGERY     BREAST EXCISIONAL BIOPSY Left    BREAST SURGERY  10/09/2013   breast reduction--Dr. Reginia Caprice   BUNIONECTOMY     bilateral   CARDIOVASCULAR STRESS TEST  07/02/2008   EF 55-60%   CATARACT EXTRACTION BILATERAL W/ ANTERIOR VITRECTOMY     2023   colonscopy     DENTAL SURGERY  2007   ESOPHAGOGASTRODUODENOSCOPY ENDOSCOPY     EYE SURGERY Bilateral 01/2022   cataracts   FOOT SURGERY     LEFT FOOT   left breast mass removed     OOPHORECTOMY  1982   RSO partial and LSO, uterus   REDUCTION MAMMAPLASTY Bilateral 2012   RSO  2002  partial   ruptured disc surgery  05/2007   TOTAL ABDOMINAL HYSTERECTOMY      Social History   Tobacco Use  Smoking Status Never   Passive exposure: Past  Smokeless Tobacco Never    Social History   Substance and Sexual Activity  Alcohol Use Yes   Alcohol/week: 3.0 standard drinks of alcohol   Types: 3 Standard drinks or equivalent per week   Comment: social     Family History  Problem Relation Age of Onset   Colon polyps Mother    Asthma Mother    Hypertension Mother    Osteopenia Mother    Coronary artery disease Father    Hypertension Father    Heart disease Father    Heart attack Father    Skin cancer Sister        melanoma   Hepatitis C Brother    Skin cancer Brother        melanoma   Colon polyps Maternal Grandmother    Alzheimer's disease Maternal Grandmother    Lung  cancer Maternal Grandfather    Heart attack Maternal Grandfather    Heart failure Paternal Grandfather    Heart attack Paternal Grandfather    Asthma Maternal Aunt    Stomach cancer Maternal Aunt    Pancreatic cancer Maternal Aunt    Asthma Grandson    Colon cancer Neg Hx    Rectal cancer Neg Hx    Breast cancer Neg Hx    Esophageal cancer Neg Hx     Reviw of Systems:  Reviewed in the HPI.  All other systems are negative.    Physical Exam: Blood pressure 128/82, pulse 82, resp. rate 16, height 5\' 6"  (1.676 m), weight 143 lb 6.4 oz (65 kg), last menstrual period 11/07/1980, SpO2 99%.       GEN:  Well nourished, well developed in no acute distress HEENT: Normal NECK: No JVD; No carotid bruits LYMPHATICS: No lymphadenopathy CARDIAC: RRR , no murmurs, rubs, gallops RESPIRATORY:  Clear to auscultation without rales, wheezing or rhonchi  ABDOMEN: Soft, non-tender, non-distended MUSCULOSKELETAL:  No edema; No deformity  SKIN: Warm and dry NEUROLOGIC:  Alert and oriented x 3     ECG:          Assessment / Plan:     1.  Transient pressure, shortness of breath .  Has asthma.  No cardiac issues     2. Hyperlipidemia:  she has a CAC score of 0 Has minimal coronary plaque Her last LDL was 75.  Continue current medications       3. Supraventricular  Tachycardia:  no recent episodes     4.  Raynaud's phenomenon:     Not as bad this past winter .      Ahmad Alert, MD  03/04/2024 11:20 AM    [facility] [address] [place],  [address] [place], [place]  [address] Pager [phone] Phone: [phone]; Fax: [phone]

## 2024-03-04 ENCOUNTER — Ambulatory Visit: Payer: Medicare Other | Attending: Cardiovascular Disease | Admitting: Cardiovascular Disease

## 2024-03-04 VITALS — BP 128/82 | HR 82 | Resp 16 | Ht 66.0 in | Wt 143.4 lb

## 2024-03-04 DIAGNOSIS — E785 Hyperlipidemia, unspecified: Secondary | ICD-10-CM | POA: Diagnosis not present

## 2024-03-04 NOTE — Patient Instructions (Addendum)
 Medication Instructions:  Your physician recommends that you continue on your current medications as directed. Please refer to the Current Medication list given to you today. *If you need a refill on your cardiac medications before your next appointment, please call your pharmacy*  Follow-Up: At [facility], you and your health needs are our priority.  As part of our continuing mission to provide you with exceptional heart care, our providers are all part of one team.  This team includes your primary Cardiologist (physician) and Advanced Practice Providers or APPs (Physician Assistants and Nurse Practitioners) who all work together to provide you with the care you need, when you need it.  Your next appointment:   1 year(s)  Provider:   Dr. Paulita Boss

## 2024-05-14 DIAGNOSIS — L570 Actinic keratosis: Secondary | ICD-10-CM | POA: Diagnosis not present

## 2024-05-28 DIAGNOSIS — H524 Presbyopia: Secondary | ICD-10-CM | POA: Diagnosis not present

## 2024-05-28 DIAGNOSIS — Z961 Presence of intraocular lens: Secondary | ICD-10-CM | POA: Diagnosis not present

## 2024-05-28 DIAGNOSIS — H04123 Dry eye syndrome of bilateral lacrimal glands: Secondary | ICD-10-CM | POA: Diagnosis not present

## 2024-06-07 DIAGNOSIS — M25571 Pain in right ankle and joints of right foot: Secondary | ICD-10-CM | POA: Diagnosis not present

## 2024-06-27 DIAGNOSIS — M25571 Pain in right ankle and joints of right foot: Secondary | ICD-10-CM | POA: Diagnosis not present

## 2024-06-27 DIAGNOSIS — G8929 Other chronic pain: Secondary | ICD-10-CM | POA: Diagnosis not present

## 2024-07-03 DIAGNOSIS — G8929 Other chronic pain: Secondary | ICD-10-CM | POA: Diagnosis not present

## 2024-07-03 DIAGNOSIS — M25571 Pain in right ankle and joints of right foot: Secondary | ICD-10-CM | POA: Diagnosis not present

## 2024-07-10 DIAGNOSIS — G8929 Other chronic pain: Secondary | ICD-10-CM | POA: Diagnosis not present

## 2024-07-10 DIAGNOSIS — M25571 Pain in right ankle and joints of right foot: Secondary | ICD-10-CM | POA: Diagnosis not present

## 2024-07-15 DIAGNOSIS — M25571 Pain in right ankle and joints of right foot: Secondary | ICD-10-CM | POA: Diagnosis not present

## 2024-07-15 DIAGNOSIS — G8929 Other chronic pain: Secondary | ICD-10-CM | POA: Diagnosis not present

## 2024-07-29 NOTE — Progress Notes (Signed)
 Office Visit Note  Patient: Lauren Montes             Date of Birth: 04-24-1956           MRN: [id number]             PCP: Vernadine Charlie ORN, MD Referring: Vernadine Charlie ORN, MD Visit Date: 08/12/2024 Occupation: Data Unavailable  Subjective:  Left wrist pain  History of Present Illness: Liyanna Cartwright is a 68 y.o. female with history of raynaud's and fibromyalgia.  Patient presents today with increased pain and stiffness in the left wrist.  She has not noticed any joint swelling but at times she has weakness in the left wrist.  She denies any increased discomfort in the [facility] joint.  She denies any nocturnal pain.  Patient states that her lower back has been doing well.  She denies any other increased joint pain or joint swelling at this time.  She experiences chronic eye dryness and uses Systane eyedrops as needed.  She has infrequent symptoms of Raynaud's phenomenon which typically worsens with colder weather temperatures.  She denies any skin tightness or thickening.  She denies any digital ulcerations.  Her energy level has been stable and she has been sleeping well at night.  She denies any other new medical conditions.    Activities of Daily Living:  Patient reports morning stiffness for 30 minutes.   Patient Denies nocturnal pain.  Difficulty dressing/grooming: Denies Difficulty climbing stairs: Denies Difficulty getting out of chair: Denies Difficulty using hands for taps, buttons, cutlery, and/or writing: Reports  Review of Systems  Constitutional:  Positive for fatigue.  HENT:  Negative for mouth sores and mouth dryness.   Eyes:  Positive for dryness.  Respiratory:  Positive for shortness of breath.   Cardiovascular:  Negative for chest pain and palpitations.  Gastrointestinal:  Negative for blood in stool, constipation and diarrhea.  Endocrine: Negative for increased urination.  Genitourinary:  Negative for involuntary urination.  Musculoskeletal:   Positive for joint pain, joint pain and morning stiffness. Negative for gait problem, joint swelling, myalgias, muscle weakness, muscle tenderness and myalgias.  Skin:  Positive for sensitivity to sunlight. Negative for color change, rash and hair loss.  Allergic/Immunologic: Negative for susceptible to infections.  Neurological:  Positive for headaches. Negative for dizziness.  Hematological:  Negative for swollen glands.  Psychiatric/Behavioral:  Negative for depressed mood and sleep disturbance. The patient is not nervous/anxious.     PMFS History:  Patient Active Problem List   Diagnosis Date Noted   Allergic rhinitis 11/22/2022   Annual physical exam 08/03/2021   Arthritis 07/09/2021   Prolapse of female genital organs 07/10/2019   Chest tightness 02/21/2019   Interstitial cystitis (chronic) without hematuria 01/07/2019   Irritable bowel syndrome 12/30/2016   SVT (supraventricular tachycardia) 09/26/2016   Raynaud's disease 06/28/2016   HTN (hypertension) 09/24/2015   Palpitations 03/05/2015   Fibromyalgia 03/05/2015   Chronic diastolic CHF (congestive heart failure), NYHA class 2 (HCC) 06/20/2014   Sinus tachycardia 09/14/2012   Dysphagia 06/29/2012   Ocular rosacea    Other chest pain 09/09/2011   Hyperlipidemia 05/10/2011   SWELLING OF LIMB 01/20/2011   Moderate recurrent major depression (HCC) 04/19/2010   ABDOMINAL PAIN-EPIGASTRIC 02/03/2010   ABDOMINAL PAIN-MULTIPLE SITES 02/03/2010   Hormone replacement therapy 10/18/2009   Migraine 10/18/2009   Moderate persistent asthma, uncomplicated 10/18/2009   Spondylosis without myelopathy 10/18/2009   CONSTIPATION 09/19/2008   ABDOMINAL PAIN, LEFT LOWER QUADRANT 09/19/2008  History of colonic polyps 09/19/2008    Past Medical History:  Diagnosis Date   Asthma    Atrophic vaginitis    Blood transfusion without reported diagnosis 1975   with vaginal delivery--very anemic   Cystic teratoma    Elevated cholesterol     Endometriosis    Esophageal ulcer    Fibromyalgia    Fundic gland polyps of stomach, benign    GERD (gastroesophageal reflux disease)    History of diastolic dysfunction    History of migraine headaches    Leg edema    left leg   Ocular rosacea    Osteoarthritis    back,hands, knees   Overactive bladder    Raynaud's disease    Status post cystoscopy 09/06/2019   [facility]   Tachycardia    Tubular adenoma of colon 08/2016    Family History  Problem Relation Age of Onset   Colon polyps Mother    Asthma Mother    Hypertension Mother    Osteopenia Mother    Coronary artery disease Father    Hypertension Father    Heart disease Father    Heart attack Father    Skin cancer Sister        melanoma   Hepatitis C Brother    Skin cancer Brother        melanoma   Colon polyps Maternal Grandmother    Alzheimer's disease Maternal Grandmother    Lung cancer Maternal Grandfather    Heart attack Maternal Grandfather    Heart failure Paternal Grandfather    Heart attack Paternal Grandfather    Asthma Maternal Aunt    Stomach cancer Maternal Aunt    Pancreatic cancer Maternal Aunt    Asthma Grandson    Colon cancer Neg Hx    Rectal cancer Neg Hx    Breast cancer Neg Hx    Esophageal cancer Neg Hx    Past Surgical History:  Procedure Laterality Date   APPENDECTOMY     BACK SURGERY     BREAST EXCISIONAL BIOPSY Left    BREAST SURGERY  10/09/2013   breast reduction--Dr. Mercer   BUNIONECTOMY     bilateral   CARDIOVASCULAR STRESS TEST  07/02/2008   EF 55-60%   CATARACT EXTRACTION BILATERAL W/ ANTERIOR VITRECTOMY     2023   colonscopy     DENTAL SURGERY  2007   ESOPHAGOGASTRODUODENOSCOPY ENDOSCOPY     EYE SURGERY Bilateral 01/2022   cataracts   FOOT SURGERY     LEFT FOOT   left breast mass removed     OOPHORECTOMY  1982   RSO partial and LSO, uterus   REDUCTION MAMMAPLASTY Bilateral 2012   RSO  2002   partial   ruptured disc surgery  05/2007   TOTAL ABDOMINAL  HYSTERECTOMY     Social History   Tobacco Use   Smoking status: Never    Passive exposure: Past   Smokeless tobacco: Never  Vaping Use   Vaping status: Never Used  Substance Use Topics   Alcohol  use: Yes    Alcohol /week: 3.0 standard drinks of alcohol     Types: 3 Standard drinks or equivalent per week    Comment: social    Drug use: No   Social History   Social History Narrative   Not on file     Immunization History  Administered Date(s) Administered   Fluzone Influenza virus vaccine,trivalent (IIV3), split virus 08/14/2012   Influenza Split 08/17/2011, 08/15/2013, 08/06/2014   Influenza, Quadrivalent,  Recombinant, Inj, Pf 07/04/2018, 08/08/2019, 07/29/2020   Influenza,inj,Quad PF,6+ Mos 08/15/2013, 08/06/2014, 08/08/2015, 06/27/2022   Influenza-Unspecified 06/28/2016, 07/07/2017   PFIZER(Purple Top)SARS-COV-2 Vaccination 01/06/2020, 02/08/2020, 08/30/2020   Pneumococcal Conjugate-13 06/28/2016   Pneumococcal Polysaccharide-23 12/01/2009   Td 06/04/2013   Tdap 12/01/2009   Zoster Recombinant(Shingrix) 07/08/2018, 01/03/2019   Zoster, Live 01/03/2019     Objective: Vital Signs: BP 97/63   Pulse 65   Temp 97.7 F (36.5 C)   Resp 14   Ht 5' 6 (1.676 m)   Wt 143 lb 12.8 oz (65.2 kg)   LMP 11/07/1980   BMI 23.21 kg/m    Physical Exam Vitals and nursing note reviewed.  Constitutional:      Appearance: She is well-developed.  HENT:     Head: Normocephalic and atraumatic.  Eyes:     Conjunctiva/sclera: Conjunctivae normal.  Cardiovascular:     Rate and Rhythm: Normal rate and regular rhythm.     Heart sounds: Normal heart sounds.  Pulmonary:     Effort: Pulmonary effort is normal.     Breath sounds: Normal breath sounds.  Abdominal:     General: Bowel sounds are normal.     Palpations: Abdomen is soft.  Musculoskeletal:     Cervical back: Normal range of motion.  Lymphadenopathy:     Cervical: No cervical adenopathy.  Skin:    General: Skin is warm  and dry.     Capillary Refill: Capillary refill takes less than 2 seconds.  Neurological:     Mental Status: She is alert and oriented to person, place, and time.  Psychiatric:        Behavior: Behavior normal.      Musculoskeletal Exam: C-spine, thoracic spine, lumbar spine have good range of motion.  No midline spinal tenderness.  No SI joint tenderness.  Shoulder joints, elbow joints, MCPs, PIPs, DIPs have good range of motion with no synovitis.  Tenderness on the dorsal aspect of the left wrist -no synovitis noted.  Full flexion and extension of both wrist joints noted.  Complete fist formation bilaterally.  Hip joints have good range of motion with no groin pain.  Knee joints have good range of motion no warmth or effusion.  Ankle joints have good range of motion no tenderness or joint swelling.  No evidence of Achilles tendinitis or plantar fasciitis.   CDAI Exam: CDAI Score: -- Patient Global: --; Provider Global: -- Swollen: --; Tender: -- Joint Exam 08/12/2024   No joint exam has been documented for this visit   There is currently no information documented on the homunculus. Go to the Rheumatology activity and complete the homunculus joint exam.  Investigation: No additional findings.  Imaging: No results found.  Recent Labs: Lab Results  Component Value Date   WBC 7.1 08/15/2016   HGB 13.9 08/15/2016   PLT 248.0 08/15/2016   NA 141 12/04/2023   K 4.9 12/04/2023   CL 106 12/04/2023   CO2 22 12/04/2023   GLUCOSE 88 12/04/2023   BUN 12 12/04/2023   CREATININE 0.84 12/04/2023   BILITOT 0.5 09/26/2017   ALKPHOS 91 09/26/2017   AST 27 09/26/2017   ALT 23 09/26/2017   PROT 7.1 09/26/2017   ALBUMIN 4.8 09/26/2017   CALCIUM  9.8 12/04/2023   GFRAA 110 09/26/2017    Speciality Comments: No specialty comments available.  Procedures:  No procedures performed Allergies: Atorvastatin , Codeine, Latex, and Azelastine  hcl   Assessment / Plan:     Visit Diagnoses:  Raynaud's disease  without gangrene: She has been experiencing infrequent symptoms of Raynaud's phenomenon.  No signs of sclerodactyly noted.  No digital ulcerations noted.  Discussed the importance of keeping her core body temperature warm as well as wearing gloves and thick socks throughout the winter months.  She will notify us  if she develops any new or worsening symptoms.  Pain in left wrist: Patient has noticed intermittent discomfort in the left wrist.  At times she has also noticed some increase stiffness and weakness.  She has not noticed any joint swelling.  No recent injury or fall.  She has not been performing any new activities but does have an exacerbation of symptoms if she is performing any repetitive or overuse activities.  On examination she has some tenderness in the dorsal aspect of the left wrist but no synovitis was noted.  She has full flexion and extension of the left wrist.  Discussed that she can try using arthritis compression gloves or a compression sleeve on the left wrist if needed.  She can also try topical agents such as Voltaren gel or Aspercreme as needed for pain relief.  If her symptoms persist or worsen she can return for updated x-rays of the left wrist or we can schedule an ultrasound to assess for inflammation.  Trochanteric bursitis of both hips: Mild tenderness over the trochanteric bursa noted--her discomfort has been manageable.  No groin pain.  Fibromyalgia: Overall her symptoms from fibromyalgia have been well-controlled.  No recent flares.  Her energy level has been stable.  She has been sleeping well at night.  Primary insomnia - She discontinued gabapentin  in June 2024.  Chronic fatigue: Her energy level has been stable.  Other medical conditions are listed as follows:  IC (interstitial cystitis)  History of migraine  History of hypercholesterolemia  History of gastroesophageal reflux (GERD)  History of asthma  History of CHF (congestive heart  failure)  Degeneration of intervertebral disc of lumbar region, unspecified whether pain present - 2 surgeries in the past performed by Dr. Alix.MRI of the lumbar spine was obtained on 05/18/2013 which revealed postoperative change on the left at L4-L5.  Osteoporosis screening - DEXA from 2022 which was normal.  She will have repeat DEXA scan in 2027.  Orders: No orders of the defined types were placed in this encounter.  No orders of the defined types were placed in this encounter.    Follow-Up Instructions: Return in about 1 year (around 08/12/2025) for Fibromyalgia.   Waddell CHRISTELLA Craze, PA-C  Note - This record has been created using Dragon software.  Chart creation errors have been sought, but may not always  have been located. Such creation errors do not reflect on  the standard of medical care.

## 2024-08-05 DIAGNOSIS — J4540 Moderate persistent asthma, uncomplicated: Secondary | ICD-10-CM | POA: Diagnosis not present

## 2024-08-05 DIAGNOSIS — M797 Fibromyalgia: Secondary | ICD-10-CM | POA: Diagnosis not present

## 2024-08-05 DIAGNOSIS — F331 Major depressive disorder, recurrent, moderate: Secondary | ICD-10-CM | POA: Diagnosis not present

## 2024-08-05 DIAGNOSIS — N3010 Interstitial cystitis (chronic) without hematuria: Secondary | ICD-10-CM | POA: Diagnosis not present

## 2024-08-05 DIAGNOSIS — K589 Irritable bowel syndrome without diarrhea: Secondary | ICD-10-CM | POA: Diagnosis not present

## 2024-08-05 DIAGNOSIS — M5416 Radiculopathy, lumbar region: Secondary | ICD-10-CM | POA: Diagnosis not present

## 2024-08-05 DIAGNOSIS — Z23 Encounter for immunization: Secondary | ICD-10-CM | POA: Diagnosis not present

## 2024-08-05 DIAGNOSIS — E7800 Pure hypercholesterolemia, unspecified: Secondary | ICD-10-CM | POA: Diagnosis not present

## 2024-08-05 DIAGNOSIS — G43909 Migraine, unspecified, not intractable, without status migrainosus: Secondary | ICD-10-CM | POA: Diagnosis not present

## 2024-08-05 DIAGNOSIS — K224 Dyskinesia of esophagus: Secondary | ICD-10-CM | POA: Diagnosis not present

## 2024-08-05 DIAGNOSIS — K219 Gastro-esophageal reflux disease without esophagitis: Secondary | ICD-10-CM | POA: Diagnosis not present

## 2024-08-05 DIAGNOSIS — D126 Benign neoplasm of colon, unspecified: Secondary | ICD-10-CM | POA: Diagnosis not present

## 2024-08-06 DIAGNOSIS — G8929 Other chronic pain: Secondary | ICD-10-CM | POA: Diagnosis not present

## 2024-08-06 DIAGNOSIS — M25571 Pain in right ankle and joints of right foot: Secondary | ICD-10-CM | POA: Diagnosis not present

## 2024-08-12 ENCOUNTER — Ambulatory Visit: Payer: Medicare Other | Attending: Physician Assistant | Admitting: Physician Assistant

## 2024-08-12 ENCOUNTER — Encounter: Payer: Self-pay | Admitting: Physician Assistant

## 2024-08-12 VITALS — BP 97/63 | HR 65 | Temp 97.7°F | Resp 14 | Ht 66.0 in | Wt 143.8 lb

## 2024-08-12 DIAGNOSIS — N3010 Interstitial cystitis (chronic) without hematuria: Secondary | ICD-10-CM | POA: Diagnosis not present

## 2024-08-12 DIAGNOSIS — Z8639 Personal history of other endocrine, nutritional and metabolic disease: Secondary | ICD-10-CM | POA: Insufficient documentation

## 2024-08-12 DIAGNOSIS — M797 Fibromyalgia: Secondary | ICD-10-CM | POA: Insufficient documentation

## 2024-08-12 DIAGNOSIS — Z8719 Personal history of other diseases of the digestive system: Secondary | ICD-10-CM | POA: Diagnosis not present

## 2024-08-12 DIAGNOSIS — Z8669 Personal history of other diseases of the nervous system and sense organs: Secondary | ICD-10-CM | POA: Diagnosis not present

## 2024-08-12 DIAGNOSIS — R5382 Chronic fatigue, unspecified: Secondary | ICD-10-CM | POA: Insufficient documentation

## 2024-08-12 DIAGNOSIS — Z13820 Encounter for screening for osteoporosis: Secondary | ICD-10-CM | POA: Diagnosis present

## 2024-08-12 DIAGNOSIS — M25532 Pain in left wrist: Secondary | ICD-10-CM | POA: Insufficient documentation

## 2024-08-12 DIAGNOSIS — M7061 Trochanteric bursitis, right hip: Secondary | ICD-10-CM | POA: Diagnosis not present

## 2024-08-12 DIAGNOSIS — M7062 Trochanteric bursitis, left hip: Secondary | ICD-10-CM | POA: Insufficient documentation

## 2024-08-12 DIAGNOSIS — I7300 Raynaud's syndrome without gangrene: Secondary | ICD-10-CM | POA: Diagnosis not present

## 2024-08-12 DIAGNOSIS — M51369 Other intervertebral disc degeneration, lumbar region without mention of lumbar back pain or lower extremity pain: Secondary | ICD-10-CM | POA: Insufficient documentation

## 2024-08-12 DIAGNOSIS — Z8679 Personal history of other diseases of the circulatory system: Secondary | ICD-10-CM | POA: Insufficient documentation

## 2024-08-12 DIAGNOSIS — F5101 Primary insomnia: Secondary | ICD-10-CM | POA: Insufficient documentation

## 2024-08-12 DIAGNOSIS — Z8709 Personal history of other diseases of the respiratory system: Secondary | ICD-10-CM | POA: Diagnosis not present

## 2024-08-15 DIAGNOSIS — L570 Actinic keratosis: Secondary | ICD-10-CM | POA: Diagnosis not present

## 2024-09-07 DIAGNOSIS — Z23 Encounter for immunization: Secondary | ICD-10-CM | POA: Diagnosis not present

## 2024-09-16 ENCOUNTER — Encounter: Payer: Self-pay | Admitting: Obstetrics and Gynecology

## 2024-09-16 ENCOUNTER — Ambulatory Visit (INDEPENDENT_AMBULATORY_CARE_PROVIDER_SITE_OTHER): Payer: Medicare Other | Admitting: Obstetrics and Gynecology

## 2024-09-16 VITALS — BP 118/82 | HR 73 | Ht 66.0 in | Wt 142.0 lb

## 2024-09-16 DIAGNOSIS — Z5181 Encounter for therapeutic drug level monitoring: Secondary | ICD-10-CM

## 2024-09-16 DIAGNOSIS — Z780 Asymptomatic menopausal state: Secondary | ICD-10-CM

## 2024-09-16 DIAGNOSIS — N952 Postmenopausal atrophic vaginitis: Secondary | ICD-10-CM

## 2024-09-16 DIAGNOSIS — Z8781 Personal history of (healed) traumatic fracture: Secondary | ICD-10-CM | POA: Diagnosis not present

## 2024-09-16 MED ORDER — ESTRADIOL 10 MCG VA TABS: 10.0000 ug | ORAL_TABLET | VAGINAL | 3 refills | Status: AC

## 2024-09-16 NOTE — Progress Notes (Unsigned)
 GYNECOLOGY  VISIT   HPI: 68 y.o.   Married  Caucasian female   G1P1001 with Patient's last menstrual period was 11/07/1980.   here for: Med check - vagifem .  It is working well for her.  States it helps her IC.    Intercourse is painful.   No vaginal bleeding or unusual discharge.      Having an increased IC symptoms.   Seeing [facility].    Feel off ladder this year and broke her ankle.    No surgery.     Doing ok on Zoloft .   She wonders if she can stop this.    GYNECOLOGIC HISTORY: Patient's last menstrual period was 11/07/1980. Contraception:  hyst  Menopausal hormone therapy:  vagifem   Last 2 paps:  03/06/15 neg  History of abnormal Pap or positive HPV:  no Mammogram:  02/02/24 Breast Density Cat B, BIRADS Cat 1 neg  Bone density 08/10/21 - normal - GCG        OB History     Gravida  1   Para  1   Term  1   Preterm      AB      Living  1      SAB      IAB      Ectopic      Multiple      Live Births                 Patient Active Problem List   Diagnosis Date Noted   Allergic rhinitis 11/22/2022   Annual physical exam 08/03/2021   Arthritis 07/09/2021   Prolapse of female genital organs 07/10/2019   Chest tightness 02/21/2019   Interstitial cystitis (chronic) without hematuria 01/07/2019   Irritable bowel syndrome 12/30/2016   SVT (supraventricular tachycardia) 09/26/2016   Raynaud's disease 06/28/2016   HTN (hypertension) 09/24/2015   Palpitations 03/05/2015   Fibromyalgia 03/05/2015   Chronic diastolic CHF (congestive heart failure), NYHA class 2 (HCC) 06/20/2014   Sinus tachycardia 09/14/2012   Dysphagia 06/29/2012   Ocular rosacea    Other chest pain 09/09/2011   Hyperlipidemia 05/10/2011   SWELLING OF LIMB 01/20/2011   Moderate recurrent major depression (HCC) 04/19/2010   ABDOMINAL PAIN-EPIGASTRIC 02/03/2010   ABDOMINAL PAIN-MULTIPLE SITES 02/03/2010   Hormone replacement therapy 10/18/2009   Migraine  10/18/2009   Moderate persistent asthma, uncomplicated 10/18/2009   Spondylosis without myelopathy 10/18/2009   CONSTIPATION 09/19/2008   ABDOMINAL PAIN, LEFT LOWER QUADRANT 09/19/2008   History of colonic polyps 09/19/2008    Past Medical History:  Diagnosis Date   Asthma    Atrophic vaginitis    Blood transfusion without reported diagnosis 1975   with vaginal delivery--very anemic   Cystic teratoma    Elevated cholesterol    Endometriosis    Esophageal ulcer    Fibromyalgia    Fundic gland polyps of stomach, benign    GERD (gastroesophageal reflux disease)    History of diastolic dysfunction    History of migraine headaches    Leg edema    left leg   Ocular rosacea    Osteoarthritis    back,hands, knees   Overactive bladder    Raynaud's disease    Status post cystoscopy 09/06/2019   [facility]   Tachycardia    Tubular adenoma of colon 08/2016    Past Surgical History:  Procedure Laterality Date   APPENDECTOMY     BACK SURGERY     BREAST  EXCISIONAL BIOPSY Left    BREAST SURGERY  10/09/2013   breast reduction--Dr. Mercer   BUNIONECTOMY     bilateral   CARDIOVASCULAR STRESS TEST  07/02/2008   EF 55-60%   CATARACT EXTRACTION BILATERAL W/ ANTERIOR VITRECTOMY     2023   colonscopy     DENTAL SURGERY  2007   ESOPHAGOGASTRODUODENOSCOPY ENDOSCOPY     EYE SURGERY Bilateral 01/2022   cataracts   FOOT SURGERY     LEFT FOOT   left breast mass removed     OOPHORECTOMY  1982   RSO partial and LSO, uterus   REDUCTION MAMMAPLASTY Bilateral 2012   RSO  2002   partial   ruptured disc surgery  05/2007   TOTAL ABDOMINAL HYSTERECTOMY      Current Outpatient Medications  Medication Sig Dispense Refill   albuterol  (VENTOLIN  HFA) 108 (90 Base) MCG/ACT inhaler Inhale 2 puffs into the lungs every 6 (six) hours as needed for wheezing or shortness of breath. 18 g 5   Aloe Vera 25 MG CAPS Take by mouth.     Azelastine  HCl 137 MCG/SPRAY SOLN Place 1 spray into both  nostrils daily. 90 mL 3   cetirizine  (ZYRTEC ) 10 MG tablet Take 10 mg by mouth daily.     cycloSPORINE (RESTASIS) 0.05 % ophthalmic emulsion 1  into affected eye     diltiazem  (CARDIZEM  CD) 120 MG 24 hr capsule Take 1 capsule (120 mg total) by mouth daily. 90 capsule 3   DULERA  200-5 MCG/ACT AERO Inhale 2 puffs into the lungs 2 (two) times daily. 3 each 3   EPINEPHrine 0.3 mg/0.3 mL IJ SOAJ injection Inject 1 Dose into the muscle as needed.     Estradiol  (VAGIFEM ) 10 MCG TABS vaginal tablet Place 1 tablet (10 mcg total) vaginally 2 (two) times a week. 24 tablet 3   ketorolac  (TORADOL ) 10 MG tablet Take 10 mg by mouth every 6 (six) hours as needed.     montelukast (SINGULAIR) 10 MG tablet Take 10 mg by mouth at bedtime.       Omega-3 Fatty Acids (OMEGA 3 PO) Take 1 capsule by mouth daily.       OVER THE COUNTER MEDICATION Metagenics Ultra Flora IB Probiotic     OVER THE COUNTER MEDICATION 2 (two) times daily. Cysta Q     Polyethyl Glycol-Propyl Glycol (SYSTANE) 0.4-0.3 % SOLN 1 drop into affected eye as needed Ophthalmic 24 time(s) a day     rosuvastatin  (CRESTOR ) 5 MG tablet Take 1 tablet (5 mg total) by mouth daily at 6 PM. 90 tablet 3   sertraline  (ZOLOFT ) 50 MG tablet Take 25 mg by mouth daily.     No current facility-administered medications for this visit.     ALLERGIES: Atorvastatin , Codeine, Latex, and Azelastine  hcl  Family History  Problem Relation Age of Onset   Colon polyps Mother    Asthma Mother    Hypertension Mother    Osteopenia Mother    Coronary artery disease Father    Hypertension Father    Heart disease Father    Heart attack Father    Skin cancer Sister        melanoma   Hepatitis C Brother    Skin cancer Brother        melanoma   Colon polyps Maternal Grandmother    Alzheimer's disease Maternal Grandmother    Lung cancer Maternal Grandfather    Heart attack Maternal Grandfather    Heart failure Paternal Grandfather  Heart attack Paternal Grandfather     Asthma Maternal Aunt    Stomach cancer Maternal Aunt    Pancreatic cancer Maternal Aunt    Asthma Grandson    Colon cancer Neg Hx    Rectal cancer Neg Hx    Breast cancer Neg Hx    Esophageal cancer Neg Hx     Social History   Socioeconomic History   Marital status: Married    Spouse name: Not on file   Number of children: 1   Years of education: Not on file   Highest education level: Not on file  Occupational History   Occupation: retired    Associate Professor: UNEMPLOYED  Tobacco Use   Smoking status: Never    Passive exposure: Past   Smokeless tobacco: Never  Vaping Use   Vaping status: Never Used  Substance and Sexual Activity   Alcohol  use: Yes    Alcohol /week: 3.0 standard drinks of alcohol     Types: 3 Standard drinks or equivalent per week    Comment: social    Drug use: No   Sexual activity: Yes    Partners: Male    Birth control/protection: Surgical    Comment: Hyst, first intercourse <16, less than 5 partners, After 16, less than 5, no std, no abnormal pap, no DES  Other Topics Concern   Not on file  Social History Narrative   Not on file   Social Drivers of Health   Financial Resource Strain: Not on file  Food Insecurity: Not on file  Transportation Needs: Not on file  Physical Activity: Not on file  Stress: Not on file  Social Connections: Not on file  Intimate Partner Violence: Not on file    Review of Systems  All other systems reviewed and are negative.   PHYSICAL EXAMINATION:   BP 118/82 (BP Location: Left Arm, Patient Position: Sitting)   Pulse 73   Ht 5' 6 (1.676 m)   Wt 142 lb (64.4 kg)   LMP 11/07/1980   SpO2 98%   BMI 22.92 kg/m     General appearance: alert, cooperative and appears stated age Head: Normocephalic, without obvious abnormality, atraumatic Neck: no adenopathy, supple, symmetrical, trachea midline and thyroid  normal to inspection and palpation Lungs: clear to auscultation bilaterally Breasts: normal appearance, no  masses or tenderness, No nipple retraction or dimpling, No nipple discharge or bleeding, No axillary or supraclavicular adenopathy Heart: regular rate and rhythm Abdomen: soft, non-tender, no masses,  no organomegaly Extremities: extremities normal, atraumatic, no cyanosis or edema Skin: Skin color, texture, turgor normal. No rashes or lesions Lymph nodes: Cervical, supraclavicular, and axillary nodes normal. No abnormal inguinal nodes palpated Neurologic: Grossly normal  Pelvic: External genitalia:  no lesions              Urethra:  normal appearing urethra with no masses, tenderness or lesions              Bartholins and Skenes: normal                 Vagina: normal appearing vagina with normal color and discharge, no lesions              Cervix: no lesions                Bimanual Exam:  Uterus:  normal size, contour, position, consistency, mobility, non-tender              Adnexa: no mass, fullness, tenderness  Rectal exam: {yes no:314532}.  Confirms.              Anus:  normal sphincter tone, no lesions  Chaperone was present for exam:  {BSCHAPERONE:31226::Emily F, CMA}  ASSESSMENT:  Status post TAH.  Status post RSO and status post LSO. Vaginal atrophy. Medication monitoring.  On Yuvafem  and doing well. Status post bilateral breast reduction.  Interstitial cystitis.  Controlled.  On Zoloft , started to treat menopause transition.  Hx fracture.   PLAN:    {LABS (Optional):23779}  ***  total time was spent for this patient encounter, including preparation, face-to-face counseling with the patient, coordination of care, and documentation of the encounter.

## 2024-09-16 NOTE — Patient Instructions (Signed)
 Phone number for scheduling bone density at [place], [phone].

## 2024-09-19 ENCOUNTER — Telehealth: Payer: Self-pay | Admitting: Cardiovascular Disease

## 2024-09-19 NOTE — Telephone Encounter (Signed)
 *  STAT* If patient is at the pharmacy, call can be transferred to refill team.   1. Which medications need to be refilled? (please list name of each medication and dose if known)   rosuvastatin  (CRESTOR ) 5 MG tablet   2. Which pharmacy/location (including street and city if local pharmacy) is medication to be sent to?  [id number] - [place], [place] - [address]    3. Do they need a 30 day or 90 day supply? 90 days

## 2024-09-20 MED ORDER — ROSUVASTATIN CALCIUM 5 MG PO TABS: 5.0000 mg | ORAL_TABLET | Freq: Every day | ORAL | 1 refills | Status: AC

## 2024-09-20 NOTE — Telephone Encounter (Signed)
 Refill sent.

## 2024-12-04 ENCOUNTER — Other Ambulatory Visit: Payer: Self-pay | Admitting: Physician Assistant

## 2024-12-05 ENCOUNTER — Telehealth: Payer: Self-pay | Admitting: Internal Medicine

## 2024-12-05 MED ORDER — DILTIAZEM HCL ER COATED BEADS 120 MG PO CP24: 120.0000 mg | ORAL_CAPSULE | Freq: Every day | ORAL | 1 refills | Status: AC

## 2024-12-05 NOTE — Telephone Encounter (Signed)
 Refills has been sent to the pharmacy.

## 2024-12-05 NOTE — Telephone Encounter (Signed)
" °*  STAT* If patient is at the pharmacy, call can be transferred to refill team.   1. Which medications need to be refilled? (please list name of each medication and dose if known)  diltiazem  (CARDIZEM  CD) 120 MG 24 hr capsule  2. Which pharmacy/location (including street and city if local pharmacy) is medication to be sent to? [id number] - [place], [place] - [address]   3. Do they need a 30 day or 90 day supply? 90 day   "

## 2025-02-27 ENCOUNTER — Ambulatory Visit: Admitting: Adult Health

## 2025-02-27 ENCOUNTER — Encounter: Admitting: Adult Health

## 2025-03-07 ENCOUNTER — Ambulatory Visit: Admitting: Internal Medicine

## 2025-03-17 ENCOUNTER — Ambulatory Visit (HOSPITAL_BASED_OUTPATIENT_CLINIC_OR_DEPARTMENT_OTHER)

## 2025-08-12 ENCOUNTER — Ambulatory Visit: Admitting: Rheumatology
